# Patient Record
Sex: Male | Born: 1935 | Race: Black or African American | Hispanic: No | Marital: Married | Smoking: Former smoker
Health system: Southern US, Community
[De-identification: ages and names within clinical notes are randomized; demographics above are authoritative.]

## PROBLEM LIST (undated history)

## (undated) DIAGNOSIS — I10 Essential (primary) hypertension: Secondary | ICD-10-CM

## (undated) DIAGNOSIS — T8859XA Other complications of anesthesia, initial encounter: Secondary | ICD-10-CM

## (undated) DIAGNOSIS — H3530 Unspecified macular degeneration: Secondary | ICD-10-CM

## (undated) DIAGNOSIS — C259 Malignant neoplasm of pancreas, unspecified: Secondary | ICD-10-CM

## (undated) DIAGNOSIS — Z87442 Personal history of urinary calculi: Secondary | ICD-10-CM

## (undated) DIAGNOSIS — N400 Enlarged prostate without lower urinary tract symptoms: Secondary | ICD-10-CM

## (undated) DIAGNOSIS — M1990 Unspecified osteoarthritis, unspecified site: Secondary | ICD-10-CM

## (undated) DIAGNOSIS — D369 Benign neoplasm, unspecified site: Secondary | ICD-10-CM

## (undated) DIAGNOSIS — Z9889 Other specified postprocedural states: Secondary | ICD-10-CM

## (undated) DIAGNOSIS — R112 Nausea with vomiting, unspecified: Secondary | ICD-10-CM

## (undated) DIAGNOSIS — E785 Hyperlipidemia, unspecified: Secondary | ICD-10-CM

## (undated) DIAGNOSIS — R0989 Other specified symptoms and signs involving the circulatory and respiratory systems: Secondary | ICD-10-CM

## (undated) DIAGNOSIS — K222 Esophageal obstruction: Secondary | ICD-10-CM

## (undated) DIAGNOSIS — C61 Malignant neoplasm of prostate: Secondary | ICD-10-CM

## (undated) DIAGNOSIS — T4145XA Adverse effect of unspecified anesthetic, initial encounter: Secondary | ICD-10-CM

## (undated) DIAGNOSIS — K219 Gastro-esophageal reflux disease without esophagitis: Secondary | ICD-10-CM

## (undated) HISTORY — PX: PARTIAL COLECTOMY: SHX5273

## (undated) HISTORY — DX: Essential (primary) hypertension: I10

## (undated) HISTORY — DX: Malignant neoplasm of prostate: C61

## (undated) HISTORY — DX: Benign neoplasm, unspecified site: D36.9

## (undated) HISTORY — DX: Other specified symptoms and signs involving the circulatory and respiratory systems: R09.89

## (undated) HISTORY — DX: Esophageal obstruction: K22.2

## (undated) HISTORY — DX: Gastro-esophageal reflux disease without esophagitis: K21.9

## (undated) HISTORY — DX: Unspecified macular degeneration: H35.30

## (undated) HISTORY — PX: HERNIA REPAIR: SHX51

## (undated) HISTORY — DX: Hyperlipidemia, unspecified: E78.5

---

## 1999-06-07 ENCOUNTER — Encounter: Admission: RE | Admit: 1999-06-07 | Discharge: 1999-06-23 | Payer: Self-pay | Admitting: Orthopedic Surgery

## 2000-03-27 HISTORY — PX: OTHER SURGICAL HISTORY: SHX169

## 2000-04-05 ENCOUNTER — Ambulatory Visit (HOSPITAL_COMMUNITY): Admission: RE | Admit: 2000-04-05 | Discharge: 2000-04-05 | Payer: Self-pay | Admitting: Internal Medicine

## 2000-04-05 ENCOUNTER — Encounter: Payer: Self-pay | Admitting: Internal Medicine

## 2000-05-28 ENCOUNTER — Other Ambulatory Visit: Admission: RE | Admit: 2000-05-28 | Discharge: 2000-05-28 | Payer: Self-pay | Admitting: Gastroenterology

## 2000-05-28 ENCOUNTER — Encounter (INDEPENDENT_AMBULATORY_CARE_PROVIDER_SITE_OTHER): Payer: Self-pay

## 2001-07-04 ENCOUNTER — Encounter: Payer: Self-pay | Admitting: Internal Medicine

## 2001-07-04 ENCOUNTER — Encounter: Admission: RE | Admit: 2001-07-04 | Discharge: 2001-07-04 | Payer: Self-pay | Admitting: Internal Medicine

## 2002-03-27 HISTORY — PX: ANKLE FUSION: SHX881

## 2002-05-22 ENCOUNTER — Emergency Department (HOSPITAL_COMMUNITY): Admission: EM | Admit: 2002-05-22 | Discharge: 2002-05-22 | Payer: Self-pay | Admitting: Emergency Medicine

## 2002-05-22 ENCOUNTER — Encounter: Payer: Self-pay | Admitting: Orthopedic Surgery

## 2002-06-05 ENCOUNTER — Encounter: Payer: Self-pay | Admitting: Orthopedic Surgery

## 2002-06-11 ENCOUNTER — Inpatient Hospital Stay (HOSPITAL_COMMUNITY): Admission: RE | Admit: 2002-06-11 | Discharge: 2002-06-13 | Payer: Self-pay | Admitting: Orthopedic Surgery

## 2003-03-20 ENCOUNTER — Emergency Department (HOSPITAL_COMMUNITY): Admission: AD | Admit: 2003-03-20 | Discharge: 2003-03-20 | Payer: Self-pay | Admitting: Family Medicine

## 2004-03-27 HISTORY — PX: ROTATOR CUFF REPAIR: SHX139

## 2004-12-30 ENCOUNTER — Ambulatory Visit (HOSPITAL_COMMUNITY): Admission: RE | Admit: 2004-12-30 | Discharge: 2004-12-31 | Payer: Self-pay | Admitting: Orthopedic Surgery

## 2005-04-14 ENCOUNTER — Ambulatory Visit: Payer: Self-pay | Admitting: Internal Medicine

## 2005-05-10 ENCOUNTER — Ambulatory Visit: Payer: Self-pay | Admitting: Internal Medicine

## 2005-07-25 ENCOUNTER — Ambulatory Visit: Payer: Self-pay | Admitting: Internal Medicine

## 2005-08-09 ENCOUNTER — Ambulatory Visit: Payer: Self-pay | Admitting: Internal Medicine

## 2005-11-15 ENCOUNTER — Ambulatory Visit: Payer: Self-pay | Admitting: Internal Medicine

## 2006-05-31 ENCOUNTER — Ambulatory Visit: Payer: Self-pay | Admitting: Internal Medicine

## 2006-05-31 LAB — CONVERTED CEMR LAB
AST: 32 units/L (ref 0–37)
Alkaline Phosphatase: 80 units/L (ref 39–117)
Bilirubin, Direct: 0.1 mg/dL (ref 0.0–0.3)
Creatinine,U: 72.2 mg/dL
Potassium: 4.3 meq/L (ref 3.5–5.1)
Total Protein: 7 g/dL (ref 6.0–8.3)

## 2006-06-13 ENCOUNTER — Ambulatory Visit: Payer: Self-pay | Admitting: Internal Medicine

## 2006-07-13 ENCOUNTER — Emergency Department (HOSPITAL_COMMUNITY): Admission: EM | Admit: 2006-07-13 | Discharge: 2006-07-13 | Payer: Self-pay | Admitting: Emergency Medicine

## 2006-07-16 ENCOUNTER — Ambulatory Visit: Payer: Self-pay | Admitting: Internal Medicine

## 2006-07-16 LAB — CONVERTED CEMR LAB
Basophils Absolute: 0 10*3/uL (ref 0.0–0.1)
Eosinophils Relative: 3.9 % (ref 0.0–5.0)
HCT: 42.8 % (ref 39.0–52.0)
Hemoglobin: 14.3 g/dL (ref 13.0–17.0)
MCV: 84.2 fL (ref 78.0–100.0)
Neutro Abs: 3.2 10*3/uL (ref 1.4–7.7)
PSA: 2.4 ng/mL (ref 0.10–4.00)
RDW: 13.3 % (ref 11.5–14.6)
TSH: 0.96 microintl units/mL (ref 0.35–5.50)

## 2006-07-25 ENCOUNTER — Ambulatory Visit: Payer: Self-pay | Admitting: Internal Medicine

## 2006-09-04 ENCOUNTER — Telehealth: Payer: Self-pay | Admitting: Internal Medicine

## 2006-09-06 DIAGNOSIS — I10 Essential (primary) hypertension: Secondary | ICD-10-CM | POA: Insufficient documentation

## 2006-09-14 ENCOUNTER — Telehealth (INDEPENDENT_AMBULATORY_CARE_PROVIDER_SITE_OTHER): Payer: Self-pay | Admitting: *Deleted

## 2006-09-24 ENCOUNTER — Encounter (INDEPENDENT_AMBULATORY_CARE_PROVIDER_SITE_OTHER): Payer: Self-pay | Admitting: *Deleted

## 2006-09-24 ENCOUNTER — Ambulatory Visit: Payer: Self-pay | Admitting: Internal Medicine

## 2006-09-27 ENCOUNTER — Ambulatory Visit: Payer: Self-pay | Admitting: Internal Medicine

## 2006-09-27 LAB — CONVERTED CEMR LAB: Cholesterol, target level: 200 mg/dL

## 2006-09-28 LAB — CONVERTED CEMR LAB
HDL: 45.8 mg/dL (ref >39.0)
Potassium: 4.3 meq/L (ref 3.5–5.1)
Triglycerides: 38 mg/dL (ref 0–149)

## 2006-11-14 ENCOUNTER — Telehealth (INDEPENDENT_AMBULATORY_CARE_PROVIDER_SITE_OTHER): Payer: Self-pay | Admitting: *Deleted

## 2006-11-22 ENCOUNTER — Ambulatory Visit: Payer: Self-pay | Admitting: Internal Medicine

## 2006-11-22 DIAGNOSIS — E119 Type 2 diabetes mellitus without complications: Secondary | ICD-10-CM | POA: Insufficient documentation

## 2006-11-26 LAB — CONVERTED CEMR LAB
Creatinine,U: 193.4 mg/dL
Microalb Creat Ratio: 9.3 mg/g (ref 0.0–30.0)

## 2006-11-27 ENCOUNTER — Encounter (INDEPENDENT_AMBULATORY_CARE_PROVIDER_SITE_OTHER): Payer: Self-pay | Admitting: *Deleted

## 2007-02-26 ENCOUNTER — Inpatient Hospital Stay (HOSPITAL_COMMUNITY): Admission: EM | Admit: 2007-02-26 | Discharge: 2007-03-07 | Payer: Self-pay | Admitting: Emergency Medicine

## 2007-03-01 ENCOUNTER — Telehealth: Payer: Self-pay | Admitting: Internal Medicine

## 2007-03-02 ENCOUNTER — Encounter (INDEPENDENT_AMBULATORY_CARE_PROVIDER_SITE_OTHER): Payer: Self-pay | Admitting: Surgery

## 2007-04-18 ENCOUNTER — Ambulatory Visit: Payer: Self-pay | Admitting: Internal Medicine

## 2007-04-20 LAB — CONVERTED CEMR LAB: Hgb A1c MFr Bld: 6 % (ref 4.6–6.0)

## 2007-04-23 ENCOUNTER — Encounter (INDEPENDENT_AMBULATORY_CARE_PROVIDER_SITE_OTHER): Payer: Self-pay | Admitting: *Deleted

## 2007-05-03 ENCOUNTER — Encounter: Admission: RE | Admit: 2007-05-03 | Discharge: 2007-05-03 | Payer: Self-pay | Admitting: Surgery

## 2007-05-15 ENCOUNTER — Ambulatory Visit: Payer: Self-pay | Admitting: Gastroenterology

## 2007-05-17 ENCOUNTER — Ambulatory Visit: Payer: Self-pay | Admitting: Gastroenterology

## 2007-07-17 ENCOUNTER — Encounter (INDEPENDENT_AMBULATORY_CARE_PROVIDER_SITE_OTHER): Payer: Self-pay | Admitting: Surgery

## 2007-07-17 ENCOUNTER — Inpatient Hospital Stay (HOSPITAL_COMMUNITY): Admission: RE | Admit: 2007-07-17 | Discharge: 2007-07-21 | Payer: Self-pay | Admitting: Surgery

## 2007-08-04 ENCOUNTER — Inpatient Hospital Stay (HOSPITAL_COMMUNITY): Admission: EM | Admit: 2007-08-04 | Discharge: 2007-08-06 | Payer: Self-pay | Admitting: Emergency Medicine

## 2007-10-15 ENCOUNTER — Ambulatory Visit: Payer: Self-pay | Admitting: Internal Medicine

## 2007-10-21 ENCOUNTER — Encounter (INDEPENDENT_AMBULATORY_CARE_PROVIDER_SITE_OTHER): Payer: Self-pay | Admitting: *Deleted

## 2007-10-28 ENCOUNTER — Ambulatory Visit: Payer: Self-pay | Admitting: Internal Medicine

## 2007-10-28 DIAGNOSIS — Z860100 Personal history of colon polyps, unspecified: Secondary | ICD-10-CM | POA: Insufficient documentation

## 2007-10-28 DIAGNOSIS — K5730 Diverticulosis of large intestine without perforation or abscess without bleeding: Secondary | ICD-10-CM | POA: Insufficient documentation

## 2008-01-21 ENCOUNTER — Ambulatory Visit: Payer: Self-pay | Admitting: Internal Medicine

## 2008-01-21 LAB — CONVERTED CEMR LAB
AST: 29 units/L (ref 0–37)
Albumin: 3.6 g/dL (ref 3.5–5.2)
BUN: 17 mg/dL (ref 6–23)
Cholesterol: 170 mg/dL (ref 0–200)
HDL: 44.3 mg/dL (ref >39.0)
LDL Cholesterol: 112 mg/dL — ABNORMAL HIGH (ref 0–99)
Total Bilirubin: 0.7 mg/dL (ref 0.3–1.2)
Total Protein: 6.7 g/dL (ref 6.0–8.3)
VLDL: 13 mg/dL (ref 0–40)

## 2008-01-28 ENCOUNTER — Ambulatory Visit: Payer: Self-pay | Admitting: Internal Medicine

## 2008-01-29 ENCOUNTER — Telehealth (INDEPENDENT_AMBULATORY_CARE_PROVIDER_SITE_OTHER): Payer: Self-pay | Admitting: *Deleted

## 2008-03-11 ENCOUNTER — Telehealth (INDEPENDENT_AMBULATORY_CARE_PROVIDER_SITE_OTHER): Payer: Self-pay | Admitting: *Deleted

## 2008-03-16 ENCOUNTER — Encounter: Payer: Self-pay | Admitting: Internal Medicine

## 2008-04-27 ENCOUNTER — Telehealth (INDEPENDENT_AMBULATORY_CARE_PROVIDER_SITE_OTHER): Payer: Self-pay | Admitting: *Deleted

## 2008-04-29 ENCOUNTER — Ambulatory Visit: Payer: Self-pay | Admitting: Internal Medicine

## 2008-05-03 LAB — CONVERTED CEMR LAB
Albumin: 3.8 g/dL (ref 3.5–5.2)
Alkaline Phosphatase: 74 units/L (ref 39–117)
Bilirubin, Direct: 0.1 mg/dL (ref 0.0–0.3)
Creatinine,U: 101.8 mg/dL
Microalb, Ur: 4.8 mg/dL — ABNORMAL HIGH (ref 0.0–1.9)
Triglycerides: 59 mg/dL (ref 0–149)
VLDL: 12 mg/dL (ref 0–40)

## 2008-05-04 ENCOUNTER — Encounter (INDEPENDENT_AMBULATORY_CARE_PROVIDER_SITE_OTHER): Payer: Self-pay | Admitting: *Deleted

## 2008-05-06 ENCOUNTER — Ambulatory Visit: Payer: Self-pay | Admitting: Internal Medicine

## 2008-05-07 ENCOUNTER — Ambulatory Visit (HOSPITAL_COMMUNITY): Admission: RE | Admit: 2008-05-07 | Discharge: 2008-05-07 | Payer: Self-pay | Admitting: Surgery

## 2008-05-22 ENCOUNTER — Telehealth (INDEPENDENT_AMBULATORY_CARE_PROVIDER_SITE_OTHER): Payer: Self-pay | Admitting: *Deleted

## 2008-06-04 ENCOUNTER — Ambulatory Visit: Payer: Self-pay | Admitting: Internal Medicine

## 2008-07-01 ENCOUNTER — Telehealth (INDEPENDENT_AMBULATORY_CARE_PROVIDER_SITE_OTHER): Payer: Self-pay | Admitting: *Deleted

## 2008-10-28 ENCOUNTER — Ambulatory Visit: Payer: Self-pay | Admitting: Internal Medicine

## 2008-10-28 LAB — CONVERTED CEMR LAB
BUN: 22 mg/dL (ref 6–23)
Creatinine, Ser: 1.2 mg/dL (ref 0.4–1.5)
Creatinine,U: 114.1 mg/dL
Potassium: 4.1 meq/L (ref 3.5–5.1)

## 2008-10-29 ENCOUNTER — Encounter (INDEPENDENT_AMBULATORY_CARE_PROVIDER_SITE_OTHER): Payer: Self-pay | Admitting: *Deleted

## 2009-01-14 ENCOUNTER — Telehealth (INDEPENDENT_AMBULATORY_CARE_PROVIDER_SITE_OTHER): Payer: Self-pay | Admitting: *Deleted

## 2009-02-15 ENCOUNTER — Telehealth (INDEPENDENT_AMBULATORY_CARE_PROVIDER_SITE_OTHER): Payer: Self-pay | Admitting: *Deleted

## 2009-02-26 ENCOUNTER — Ambulatory Visit: Payer: Self-pay | Admitting: Internal Medicine

## 2009-04-13 ENCOUNTER — Ambulatory Visit: Payer: Self-pay | Admitting: Internal Medicine

## 2009-04-13 DIAGNOSIS — N400 Enlarged prostate without lower urinary tract symptoms: Secondary | ICD-10-CM

## 2009-04-13 DIAGNOSIS — E785 Hyperlipidemia, unspecified: Secondary | ICD-10-CM | POA: Insufficient documentation

## 2009-05-14 ENCOUNTER — Ambulatory Visit: Payer: Self-pay | Admitting: Internal Medicine

## 2009-05-17 LAB — CONVERTED CEMR LAB
ALT: 27 units/L (ref 0–53)
AST: 38 units/L — ABNORMAL HIGH (ref 0–37)
Alkaline Phosphatase: 70 units/L (ref 39–117)
Basophils Absolute: 0.1 10*3/uL (ref 0.0–0.1)
Basophils Relative: 1.7 % (ref 0.0–3.0)
Bilirubin, Direct: 0.1 mg/dL (ref 0.0–0.3)
Eosinophils Absolute: 0.2 10*3/uL (ref 0.0–0.7)
HCT: 41.9 % (ref 39.0–52.0)
HDL: 52.1 mg/dL (ref >39.00)
LDL Cholesterol: 84 mg/dL (ref 0–99)
Lymphocytes Relative: 25.7 % (ref 12.0–46.0)
Lymphs Abs: 1 10*3/uL (ref 0.7–4.0)
Monocytes Absolute: 0.4 10*3/uL (ref 0.1–1.0)
Monocytes Relative: 10.7 % (ref 3.0–12.0)
Neutrophils Relative %: 57 % (ref 43.0–77.0)
Total Bilirubin: 0.7 mg/dL (ref 0.3–1.2)
Total Protein: 7.3 g/dL (ref 6.0–8.3)
VLDL: 20.2 mg/dL (ref 0.0–40.0)
WBC: 3.8 10*3/uL — ABNORMAL LOW (ref 4.5–10.5)

## 2009-06-24 ENCOUNTER — Encounter: Payer: Self-pay | Admitting: Internal Medicine

## 2009-09-19 ENCOUNTER — Emergency Department (HOSPITAL_COMMUNITY): Admission: EM | Admit: 2009-09-19 | Discharge: 2009-09-19 | Payer: Self-pay | Admitting: Family Medicine

## 2009-09-29 ENCOUNTER — Ambulatory Visit: Payer: Self-pay | Admitting: Internal Medicine

## 2009-09-29 DIAGNOSIS — R972 Elevated prostate specific antigen [PSA]: Secondary | ICD-10-CM

## 2009-09-29 LAB — CONVERTED CEMR LAB: Hgb A1c MFr Bld: 6.1 % (ref 4.6–6.5)

## 2009-10-01 ENCOUNTER — Encounter: Payer: Self-pay | Admitting: Internal Medicine

## 2009-10-01 ENCOUNTER — Telehealth (INDEPENDENT_AMBULATORY_CARE_PROVIDER_SITE_OTHER): Payer: Self-pay | Admitting: *Deleted

## 2009-11-04 ENCOUNTER — Telehealth (INDEPENDENT_AMBULATORY_CARE_PROVIDER_SITE_OTHER): Payer: Self-pay | Admitting: *Deleted

## 2009-11-08 ENCOUNTER — Encounter: Payer: Self-pay | Admitting: Internal Medicine

## 2009-11-26 ENCOUNTER — Ambulatory Visit: Payer: Self-pay | Admitting: Internal Medicine

## 2009-11-30 ENCOUNTER — Encounter: Payer: Self-pay | Admitting: Internal Medicine

## 2009-11-30 ENCOUNTER — Emergency Department (HOSPITAL_COMMUNITY)
Admission: EM | Admit: 2009-11-30 | Discharge: 2009-11-30 | Payer: Self-pay | Source: Home / Self Care | Admitting: Emergency Medicine

## 2009-12-14 ENCOUNTER — Telehealth (INDEPENDENT_AMBULATORY_CARE_PROVIDER_SITE_OTHER): Payer: Self-pay | Admitting: *Deleted

## 2009-12-21 ENCOUNTER — Encounter: Payer: Self-pay | Admitting: Internal Medicine

## 2009-12-22 ENCOUNTER — Ambulatory Visit: Payer: Self-pay | Admitting: Internal Medicine

## 2009-12-22 DIAGNOSIS — T783XXA Angioneurotic edema, initial encounter: Secondary | ICD-10-CM | POA: Insufficient documentation

## 2009-12-22 DIAGNOSIS — Z8546 Personal history of malignant neoplasm of prostate: Secondary | ICD-10-CM | POA: Insufficient documentation

## 2009-12-23 ENCOUNTER — Encounter: Payer: Self-pay | Admitting: Internal Medicine

## 2009-12-24 ENCOUNTER — Encounter: Admission: RE | Admit: 2009-12-24 | Discharge: 2009-12-24 | Payer: Self-pay | Admitting: Internal Medicine

## 2010-03-23 ENCOUNTER — Encounter: Payer: Self-pay | Admitting: Internal Medicine

## 2010-03-27 HISTORY — PX: TOTAL HIP ARTHROPLASTY: SHX124

## 2010-04-26 NOTE — Letter (Signed)
Summary: [facility]   Imported By: Lanelle Bal 07/05/2009 13:57:47  _____________________________________________________________________  External Attachment:    Type:   Image     Comment:   External Document

## 2010-04-26 NOTE — Progress Notes (Signed)
Summary: elevated bp   Phone Note Call from Patient Call back at Home Phone [phone]   Caller: Patient Summary of Call: Patient called says the bp is still elevated current dose of meds not helping on two different occassions bp was 154/89 on patient cuff and [facility] 169[facility]84. He also stated that when he was in hospital for prostate biopsy his bp was 200/110 so he had to wait until it came down before he was let go wanted to know what he should do no he is currently taking 1 1/2 two times a day of carvedilol.  Hop pls advise Initial call taken by: Doristine Devoid CMA,  December 14, 2009 8:28 AM  Follow-up for Phone Call        Increase Carvedilol to 25 mg bid Follow-up by: Marga Melnick MD,  December 14, 2009 8:51 AM  Additional Follow-up for Phone Call Additional follow up Details #1::        spoke w/ patient aware of recommendations informed that he can finish what he already has at home and take 2 of the 12.5 two times a day until he runs out and then he will have new prescription at pharmacy...Marland KitchenMarland KitchenDoristine Devoid CMA  December 14, 2009 11:02 AM     New/Updated Medications: CARVEDILOL 25 MG TABS (CARVEDILOL) take one tablet two times a day Prescriptions: CARVEDILOL 25 MG TABS (CARVEDILOL) take one tablet two times a day  #60 x 2   Entered by:   Doristine Devoid CMA   Authorized by:   Marga Melnick MD   Signed by:   Doristine Devoid CMA on 12/14/2009   Method used:   [facility] to        [facility]  [address] [id number]       [address]       [place], [place]  [address]       Ph: [phone]       Fax: [phone]   RxID:   [id number]

## 2010-04-26 NOTE — Assessment & Plan Note (Signed)
Summary: STILL W/ ELEVATED BP/CDJ   Vital Signs:  Patient profile:   76 year old male Weight:      130.4 pounds Temp:     98.2 degrees F oral Pulse rate:   72 / minute BP sitting:   190 / 98  (left arm) Cuff size:   large  Vitals Entered By: Shonna Chock CMA (December 22, 2009 8:07 AM)  Serial Vital Signs/Assessments:  Time      Position  BP       Pulse  Resp  Temp     By 8:08 AM   R Arm     174/98                         Chrae Malloy CMA  CC: Elevated B/P since change in B/P med   CC:  Elevated B/P since change in B/P med.  History of Present Illness: Hypertension Follow-Up      This is a 74 year old man who presents for Hypertension follow-up.  The patient reports "gnagging" intermittent occipital  headaches, but denies lightheadedness, urinary frequency, and fatigue.  Associated symptoms include  intermittent L chest pressure. This was evaluated @ ER 11/30/2009  with EKG .He was seen there  for marked HTN following prostate biopsy. ("low grade cancer " as per Dr Vonita Moss 12/21/2009) .  BP was up to 203/99 in ER  but was 154/83 on repeat prior to discharge. Records were reviewed : creat 1.3; cardiac enzymes normal.The patient denies the following associated symptoms: chest pain, chest pressure, exercise intolerance, dyspnea, syncope, leg edema, and pedal edema.  Compliance with medications (by patient report) has been near 100%.  The patient reports exercising daily as walking 1 mpd.  Adjunctive measures currently used by the patient include salt restriction.  BP had been well controlled on Benazepril /Amlodipine but he developed lip angioedema.  Allergies (verified): 1)  ! Cipro (Ciprofloxacin Hcl) 2)  ! Benazepril Hcl (Ace-I) (Benazepril Hcl)  Past History:  Past Medical History: Hypertension R femoral  bruit BPH Colonic polyps, hx of Diverticulosis, colon Macular Degeneration, Glaucoma suspect Hyperlipidemia Prostate cancer, hx of , 11/2009, Dr Vonita Moss  Past  Surgical History: esophageal dilation-05/2000 squamous papilloma of esophagus ankle fusion-04/2002 colonoscopy-09/2003 ; partial colectomy 02/2007 , Dr Luisa Hart, perforated diverticulitis; reattachment 06/2007 rotator cuff sx-12/2004; Prostate biopsy 11/30/2009  Physical Exam  General:  Thin,in no acute distress; alert,appropriate and cooperative throughout examination Lungs:  Normal respiratory effort, chest expands symmetrically. Lungs are clear to auscultation, no crackles or wheezes. Heart:  normal rate, regular rhythm, no gallop, no rub, no JVD, no HJR, and grade 1 /6 systolic murmur @ apex.   Abdomen:  Bowel sounds positive,abdomen soft and non-tender without masses, organomegaly or hernias noted.No AAA;? faint  bruit L periumbilical area Pulses:  R and L carotid,radial,dorsalis pedis and posterior tibial pulses are full and equal bilaterally Extremities:  No clubbing, cyanosis, edema. Psych:  memory intact for recent and remote, normally interactive, good eye contact, and not anxious appearing.     Impression & Recommendations:  Problem # 1:  HYPERTENSION (ICD-401.9)  uncontrolled  His updated medication list for this problem includes:    Hydrochlorothiazide 12.5 Mg Caps (Hydrochlorothiazide) .Marland Kitchen... 1 qd    Carvedilol 25 Mg Tabs (Carvedilol) .Marland Kitchen... Take one tablet two times a day    Amlodipine Besylate 10 Mg Tabs (Amlodipine besylate) .Marland Kitchen... 1 once daily  Orders: Radiology Referral (Radiology)  Problem # 2:  ANGIOEDEMA (  ICD-995.1) due to ACE-I  Problem # 3:  PROSTATE CANCER, HX OF (ICD-V10.46)  Complete Medication List: 1)  Metformin Hcl 1000 Mg Tabs (Metformin hcl) .... Take 1/2 pill with largest meal 2)  Multivitamin Macular Protect Complete  3)  Pravastatin Sodium 20 Mg Tabs (Pravastatin sodium) .Marland Kitchen.. 1 qhs 4)  Hydrochlorothiazide 12.5 Mg Caps (Hydrochlorothiazide) .Marland Kitchen.. 1 qd 5)  Macular Protect  .... 2 by mouth two times a day 6)  Aleve 220 Mg Tabs (Naproxen sodium)  .... As needed 7)  Aspirin 81 Mg Tabs (Aspirin) .Marland Kitchen.. 1 by mouth once daily 8)  Carvedilol 25 Mg Tabs (Carvedilol) .... Take one tablet two times a day 9)  Amlodipine Besylate 10 Mg Tabs (Amlodipine besylate) .Marland Kitchen.. 1 once daily  Patient Instructions: 1)  You can NOT take ACE-I or ARB BP meds. Restart Amlodipine 10 mg once daily.  2)  Check your Blood Pressure regularly. If it is above: 140/90 ON AVERAGE after 7-10 days  you should make an appointment.

## 2010-04-26 NOTE — Assessment & Plan Note (Signed)
Summary: ELEVATED BP, HEADACHE//FD   Vital Signs:  Patient profile:   75 year old male Weight:      130.8 pounds Temp:     98.6 degrees F oral Pulse rate:   72 / minute Resp:     16 per minute BP sitting:   174 / 110  (left arm) Cuff size:   large  Vitals Entered By: Shonna Chock CMA (November 26, 2009 3:42 PM) CC: Elevated B/P and headaches, compared cuffs patient's cuff: 154/88 pulse 77   CC:  Elevated B/P and headaches and compared cuffs patient's cuff: 154/88 pulse 77.  History of Present Illness: Hypertension Follow-Up      This is a 75 year old man who presents for Hypertension follow-up.  The patient reports lightheadedness and headaches, but denies urinary frequency and fatigue.  The patient denies the following associated symptoms: chest pain, chest pressure, exercise intolerance, dyspnea, palpitations, syncope, leg edema, and pedal edema.  Compliance with medications (by patient report) has been near 100%.  Adjunctive measures currently used by the patient include salt restriction.   His BP @ home : 132/73- 149/102  after change from Labetalol & Amlodipine/ Lisinopril.  Current Medications (verified): 1)  Metformin Hcl 1000 Mg  Tabs (Metformin Hcl) .... Take 1/2 Pill With Largest Meal 2)  Multivitamin Macular Protect Complete 3)  Pravastatin Sodium 20 Mg Tabs (Pravastatin Sodium) .Marland Kitchen.. 1 Qhs 4)  Hydrochlorothiazide 12.5 Mg Caps (Hydrochlorothiazide) .Marland Kitchen.. 1 Qd 5)  Macular Protect .... 2 By Mouth Two Times A Day 6)  Aleve 220 Mg Tabs (Naproxen Sodium) .... As Needed 7)  Aspirin 81 Mg Tabs (Aspirin) .Marland Kitchen.. 1 By Mouth Once Daily 8)  Carvedilol 6.25 Mg Tabs (Carvedilol) .Marland Kitchen.. 1 Two Times A Day (In Place of Labetatlol & Lotrel)  Allergies (verified): No Known Drug Allergies  Physical Exam  General:  Thin,in no acute distress; alert,appropriate and cooperative throughout examination Lungs:  Normal respiratory effort, chest expands symmetrically. Lungs are clear to auscultation,  no crackles or wheezes. Heart:  Normal rate and regular rhythm. S1 and S2 normal without gallop, murmur, click, rub or other extra sounds. Abdomen:  Bowel sounds positive,abdomen soft and non-tender without masses, organomegaly or hernias noted.Mid line op scar. No AAA or bruits Pulses:  R and L carotid,radial,dorsalis pedis and posterior tibial pulses are full and equal bilaterally Extremities:  No clubbing, cyanosis, edema.   Impression & Recommendations:  Problem # 1:  HYPERTENSION (ICD-401.9)  His updated medication list for this problem includes:    Hydrochlorothiazide 12.5 Mg Caps (Hydrochlorothiazide) .Marland Kitchen... 1 qd  Orders: Prescription Created Electronically [phone])  Complete Medication List: 1)  Metformin Hcl 1000 Mg Tabs (Metformin hcl) .... Take 1/2 pill with largest meal 2)  Multivitamin Macular Protect Complete  3)  Pravastatin Sodium 20 Mg Tabs (Pravastatin sodium) .Marland Kitchen.. 1 qhs 4)  Hydrochlorothiazide 12.5 Mg Caps (Hydrochlorothiazide) .Marland Kitchen.. 1 qd 5)  Macular Protect  .... 2 by mouth two times a day 6)  Aleve 220 Mg Tabs (Naproxen sodium) .... As needed 7)  Aspirin 81 Mg Tabs (Aspirin) .Marland Kitchen.. 1 by mouth once daily 8)  Carvedilol 12.5 Mg Tabs (carvedilol)  .Marland Kitchen.. 1 two times a day  Patient Instructions: 1)  Finish Carvedilol 6.25  as 2 pills two times a day, then fill new Rx for 12.5. After 7-10 days if BP not @ goal of < 135/85; increase to 12.5 mg 1& 1/2 two times a day . Maximal dose 25 mg two times a day  Prescriptions: CARVEDILOL 12.5 MG TABS (CARVEDILOL) 1 two times a day  #60 x 5   Entered and Authorized by:   Marga Melnick MD   Signed by:   Marga Melnick MD on 11/26/2009   Method used:   [facility] ...       [facility]  [address] [id number]       [address]       [place], [place]  [address]       Ph: [phone]       Fax: [phone]   RxID:   [id number]

## 2010-04-26 NOTE — Letter (Signed)
[facility]: [facility]   Imported By: Lanelle Bal 12/29/2009 09:54:21  _____________________________________________________________________  External Attachment:    Type:   Image     Comment:   External Document

## 2010-04-26 NOTE — Consult Note (Signed)
Summary: [facility]  [facility]   Imported By: Lanelle Bal 11/23/2009 09:15:38  _____________________________________________________________________  External Attachment:    Type:   Image     Comment:   External Document

## 2010-04-26 NOTE — Progress Notes (Signed)
Summary: refill  Phone Note Refill Request Call back at Home Phone [phone] Message from:  Patient on November 04, 2009 9:23 AM  Refills Requested: Medication #1:  METFORMIN HCL 1000 MG  TABS TAKE 1/2 pill with largest meal  Medication #2:  HYDROCHLOROTHIAZIDE 12.5 MG CAPS 1 qd patient needs 90 day sypply [name]  fax [phone]  Initial call taken by: Okey Regal [place],  November 04, 2009 9:24 AM    Prescriptions: HYDROCHLOROTHIAZIDE 12.5 MG CAPS (HYDROCHLOROTHIAZIDE) 1 qd  #90 x 1   Entered by:   Shonna Chock CMA   Authorized by:   Marga Melnick MD   Signed by:   Shonna Chock CMA on 11/04/2009   Method used:   Faxed to ...       [facility] (mail-order)             , [place]         Ph: [phone]       Fax: [phone]   RxID:   [id number] METFORMIN HCL 1000 MG  TABS (METFORMIN HCL) TAKE 1/2 pill with largest meal  #90 x 1   Entered by:   Shonna Chock CMA   Authorized by:   Marga Melnick MD   Signed by:   Shonna Chock CMA on 11/04/2009   Method used:   Faxed to ...       [facility] (mail-order)             , [place]         Ph: [phone]       Fax: [phone]   RxID:   [id number]

## 2010-04-26 NOTE — Assessment & Plan Note (Signed)
Summary: prostate exam//PSA//lch   Vital Signs:  Patient profile:   75 year old male Weight:      133.8 pounds BMI:     19.00 Temp:     98.3 degrees F oral Pulse rate:   72 / minute Resp:     16 per minute BP sitting:   122 / 78  (left arm) Cuff size:   large  Vitals Entered By: Shonna Chock (September 29, 2009 3:26 PM) CC: 1.) Prostate Exam  2.) Discuss meds-? reaction, seen in Emergency Room on 6/26  3.)Refill meds Comments REVIEWED MED LIST, PATIENT AGREED DOSE AND INSTRUCTION CORRECT    CC:  1.) Prostate Exam  2.) Discuss meds-? reaction and seen in Emergency Room on 6/26  3.)Refill meds.  History of Present Illness: No significant GU symptoms .PSA was 2.40 in 06/2006 & 4.91 in 04/2009. BPH  1+ on DRE 02/11. No FH prostate disease.  Allergies (verified): No Known Drug Allergies  Review of Systems General:  Denies chills, fever, sweats, and weight loss. GU:  Denies discharge, dysuria, hematuria, incontinence, nocturia, urinary frequency, and urinary hesitancy; Some decrease in flow. Endo:  Denies excessive hunger, excessive thirst, and excessive urination; FBS average 104.  Physical Exam  General:  Thin,alert,appropriate and cooperative throughout examination Abdomen:  Bowel sounds positive,abdomen soft and non-tender without masses, organomegaly or hernias noted. Well healed op scar Rectal:  No external abnormalities noted. Normal sphincter tone. No rectal masses or tenderness. Genitalia:  Testes bilaterally descended without nodularity, tenderness or masses. No scrotal masses or lesions. No penis lesions or urethral discharge. Prostate:  no nodules, no induration, and R asymmetrical 1+ enlargement.     Impression & Recommendations:  Problem # 1:  HYPERPLASIA PROSTATE UNS W/O UR OBST & OTH LUTS (ICD-600.90)  Mild R enlargement  Orders: Venipuncture (15176) TLB-PSA (Prostate Specific Antigen) (84153-PSA) TLB-Udip w/ Micro (81001-URINE)  Problem # 2:  PROSTATE  SPECIFIC ANTIGEN, ELEVATED (ICD-790.93)  4.91 in 04/2009  Orders: Venipuncture (16073) TLB-PSA (Prostate Specific Antigen) (84153-PSA) TLB-Udip w/ Micro (81001-URINE)  Complete Medication List: 1)  Labetalol Hcl 300 Mg Tabs (Labetalol hcl) .Marland Kitchen.. 1&1/2 two times a day 2)  Metformin Hcl 1000 Mg Tabs (Metformin hcl) .... Take 1/2 pill with largest meal 3)  Lotrel 10-40 Mg Caps (Amlodipine besy-benazepril hcl) .... Take one tablet daily 4)  Multivitamin Macular Protect Complete  5)  Pravastatin Sodium 20 Mg Tabs (Pravastatin sodium) .Marland Kitchen.. 1 qhs 6)  Hydrochlorothiazide 12.5 Mg Caps (Hydrochlorothiazide) .Marland Kitchen.. 1 qd 7)  Macular Protect  .... 2 by mouth two times a day 8)  Aleve 220 Mg Tabs (Naproxen sodium) .... As needed 9)  Aspirin 81 Mg Tabs (Aspirin) .Marland Kitchen.. 1 by mouth once daily  Patient Instructions: 1)  Report any prostate symptoms  Appended Document: prostate exam//PSA//lch See Appendum to A1c . ACE-I angioedema suspect. Plan: D/C Lotrel . Start Carvedilol .  Appended Document: prostate exam//PSA//lch  Laboratory Results   Urine Tests   Date/Time Reported: September 29, 2009 4:14 PM   Routine Urinalysis   Color: yellow Appearance: Clear Glucose: negative   (Normal Range: Negative) Bilirubin: negative   (Normal Range: Negative) Ketone: negative   (Normal Range: Negative) Spec. Gravity: 1.025   (Normal Range: 1.003-1.035) Blood: negative   (Normal Range: Negative) pH: 5.0   (Normal Range: 5.0-8.0) Protein: negative   (Normal Range: Negative) Urobilinogen: negative   (Normal Range: 0-1) Nitrite: negative   (Normal Range: Negative) Leukocyte Esterace: negative   (Normal Range: Negative)  Comments: Floydene Flock  September 29, 2009 4:15 PM

## 2010-04-26 NOTE — Miscellaneous (Signed)
Summary: Orders Update  Clinical Lists Changes  Orders: Added new Referral order of Urology Referral (Urology) - Signed 

## 2010-04-26 NOTE — Assessment & Plan Note (Signed)
Summary: med refill//ph   Vital Signs:  Patient profile:   75 year old male Height:      70.5 inches Weight:      131 pounds Temp:     97.9 degrees F oral Pulse rate:   64 / minute Resp:     16 per minute BP sitting:   130 / 80  (left arm)  Vitals Entered By: Jeremy Johann CMA (April 13, 2009 1:00 PM) CC: med refill, Hypertension Management, Lipid Management Comments REVIEWED MED LIST, PATIENT AGREED DOSE AND INSTRUCTION CORRECT    CC:  med refill, Hypertension Management, and Lipid Management.  History of Present Illness: [name] is asymptomatic @ present; BP averages 123/63 @ home. No specific diet. Walking 3-5 mp week w/o symptoms except ankle pain occasionally.   Hypertension History:      He complains of neurologic problems, but denies headache, chest pain, palpitations, dyspnea with exertion, orthopnea, PND, peripheral edema, visual symptoms, syncope, and side effects from treatment.        Positive major cardiovascular risk factors include male age 74 years old or older, diabetes, hyperlipidemia, and hypertension.  Negative major cardiovascular risk factors include negative family history for ischemic heart disease and non-tobacco-user status.        Further assessment for target organ damage reveals no history of ASHD, stroke/TIA, or peripheral vascular disease.    Lipid Management History:      Positive NCEP/ATP III risk factors include male age 71 years old or older, diabetes, and hypertension.  Negative NCEP/ATP III risk factors include no family history for ischemic heart disease, non-tobacco-user status, no ASHD (atherosclerotic heart disease), no prior stroke/TIA, no peripheral vascular disease, and no history of aortic aneurysm.      Preventive Screening-Counseling & Management  Caffeine-Diet-Exercise     Does Patient Exercise: no  Allergies (verified): No Known Drug Allergies  Past History:  Past Medical History: Hypertension R femoral   bruit BPH Colonic polyps, hx of Diverticulosis, colon Macular Degeneration, Glaucoma suspect Hyperlipidemia  Past Surgical History: esophageal dilation-05/2000 squamous papilloma of esophagus ankle fusion-04/2002 colonoscopy-09/2003 ; partial colectomy 02/2007 , Dr Luisa Hart, perforated diverticulitis; reattachment 06/2007 rotator cuff sx-12/2004  Family History: Father : renal disease ? from rheumatic fever, HTN No Fam hx MI  Social History: Former Smoker: quit 1991 Alcohol use-no Retired Married Regular exercise-no Does Patient Exercise:  no  Review of Systems General:  Denies fatigue and weight loss. Eyes:  Denies blurring, double vision, and vision loss-both eyes; No retinopathy. ENT:  Denies difficulty swallowing and hoarseness. CV:  Denies leg cramps with exertion, lightheadness, and near fainting. Resp:  Denies cough, shortness of breath, and sputum productive. GI:  Denies abdominal pain, bloody stools, dark tarry stools, and indigestion. GU:  Denies discharge, dysuria, and hematuria. MS:  Complains of joint pain and joint swelling; denies joint redness. Derm:  Denies poor wound healing. Neuro:  Complains of numbness and tingling; N&T @ ankles esp LLE, ? from arthritis. Endo:  Denies cold intolerance, excessive hunger, excessive thirst, excessive urination, and heat intolerance; FBS average 103; no hypoglycemia.  Physical Exam  General:  Thin but  well-nourished,in no acute distress; alert,appropriate and cooperative throughout examination Neck:  No deformities, masses, or tenderness noted. Lungs:  Normal respiratory effort, chest expands symmetrically. Lungs are clear to auscultation, no crackles or wheezes. Heart:  Normal rate and regular rhythm. S1 and S2 normal without gallop, murmur, click, rub. S4 Abdomen:  Bowel sounds positive,abdomen scaphoid,soft and non-tender without masses,  organomegaly or hernias noted. Rectal:  No external abnormalities noted. Normal  sphincter tone. No rectal masses or tenderness. Genitalia:  Testes bilaterally descended without nodularity, tenderness or masses. No scrotal masses or lesions. No penis lesions or urethral discharge. Prostate:  no nodules, no asymmetry, no induration, and 1+ enlarged.   Pulses:  R and L carotid,radial,dorsalis pedis and posterior tibial pulses are full and equal bilaterally Extremities:  No clubbing, cyanosis.trace left pedal edema.  Fusiform L ankle Neurologic:  alert & oriented X3, sensation intact to light touch over feet, and DTRs symmetrical and normal.   Cervical Nodes:  No lymphadenopathy noted Axillary Nodes:  No palpable lymphadenopathy Psych:  memory intact for recent and remote, normally interactive, and good eye contact.     Impression & Recommendations:  Problem # 1:  DIABETES-TYPE 2 (ICD-250.00)  His updated medication list for this problem includes:    Metformin Hcl 1000 Mg Tabs (Metformin hcl) .Marland Kitchen... Take 1/2 pill with largest meal  Problem # 2:  HYPERTENSION (ICD-401.9)  His updated medication list for this problem includes:    Labetalol Hcl 300 Mg Tabs (Labetalol hcl) .Marland Kitchen... 1&1/2 two times a day    Hydrochlorothiazide 12.5 Mg Caps (Hydrochlorothiazide) .Marland Kitchen... 1 qd  Orders: EKG w/ Interpretation (93000)  Problem # 3:  HYPERLIPIDEMIA (ICD-272.4)  His updated medication list for this problem includes:    Pravastatin Sodium 20 Mg Tabs (Pravastatin sodium) .Marland Kitchen... 1 qhs  Problem # 4:  HYPERPLASIA PROSTATE UNS W/O UR OBST & OTH LUTS (ICD-600.90)  Complete Medication List: 1)  Labetalol Hcl 300 Mg Tabs (Labetalol hcl) .Marland Kitchen.. 1&1/2 two times a day 2)  Metformin Hcl 1000 Mg Tabs (Metformin hcl) .... Take 1/2 pill with largest meal 3)  Lotrel 10-40 Mg Caps (amlodipine Besy-benazepril Hcl)  .Marland Kitchen.. 1 by mouth qd 4)  Multivitamin Macular Protect Complete  5)  Pravastatin Sodium 20 Mg Tabs (Pravastatin sodium) .Marland Kitchen.. 1 qhs 6)  Hydrochlorothiazide 12.5 Mg Caps (Hydrochlorothiazide)  .Marland Kitchen.. 1 qd 7)  Macular Protect  .... 2 by mouth two times a day  Other Orders: TD Toxoids IM 7 YR + (65784) Admin 1st Vaccine (69629) Admin 1st Vaccine [facility]) [phone])  Hypertension Assessment/Plan:      The patient's hypertensive risk group is category C: Target organ damage and/or diabetes.  His calculated 10 year risk of coronary heart disease is 18 %.  Today's blood pressure is 130/80.    Lipid Assessment/Plan:      Based on NCEP/ATP III, the patient's risk factor category is "history of diabetes".  The patient's lipid goals are as follows: Total cholesterol goal is 200; LDL cholesterol goal is 100; HDL cholesterol goal is 40; Triglyceride goal is 150.  His LDL cholesterol goal has been met.  Secondary causes for hyperlipidemia have been ruled out.  He has been counseled on adjunctive measures for lowering his cholesterol and has been provided with dietary instructions.    Patient Instructions: 1)  Check your blood sugars regularly. If your readings are usually above : 150 or below 90 you should contact our office. 2)  See your eye doctor yearly to check for diabetic eye damage. 3)  Check your feet each night for sore areas, calluses or signs of infection. 4)  Check your Blood Pressure regularly. If it is above:135/85 ON AVERAGE  you should make an appointment. 5)  Please schedule a follow-up appointment in 1 month for fasting labs. 6)  BUN,creat,K+ prior to visit, ICD-9:401.9 7)  Hepatic Panel prior to  visit, ICD-9:995.20 8)  Lipid Panel prior to visit, ICD-9:272.4 9)  TSH prior to visit, ICD-9:272.4 10)  CBC w/ Diff prior to visit, ICD-9:211.3 11)  PSA prior to visit, ICD-9:600.9 Prescriptions: HYDROCHLOROTHIAZIDE 12.5 MG CAPS (HYDROCHLOROTHIAZIDE) 1 qd  #90 x 1   Entered and Authorized by:   Marga Melnick MD   Signed by:   Marga Melnick MD on 04/13/2009   Method used:   Print then Give to Patient   RxID:   [id number] PRAVASTATIN SODIUM 20 MG TABS (PRAVASTATIN SODIUM) 1  qhs  #90 Tablet x 3   Entered and Authorized by:   Marga Melnick MD   Signed by:   Marga Melnick MD on 04/13/2009   Method used:   Print then Give to Patient   RxID:   [id number] LOTREL 10-40 MG  CAPS (AMLODIPINE BESY-BENAZEPRIL HCL) 1 by mouth qd  #90 x 1   Entered and Authorized by:   Marga Melnick MD   Signed by:   Marga Melnick MD on 04/13/2009   Method used:   Print then Give to Patient   RxID:   [id number] METFORMIN HCL 1000 MG  TABS (METFORMIN HCL) TAKE 1/2 pill with largest meal  #90 x 0   Entered and Authorized by:   Marga Melnick MD   Signed by:   Marga Melnick MD on 04/13/2009   Method used:   Print then Give to Patient   RxID:   [id number] LABETALOL HCL 300 MG  TABS (LABETALOL HCL) 1&1/2 two times a day  #450 x 1   Entered and Authorized by:   Marga Melnick MD   Signed by:   Marga Melnick MD on 04/13/2009   Method used:   Print then Give to Patient   RxID:   [id number]    Tetanus/Td Vaccine    Vaccine Type: Td    Site: right deltoid    Mfr: Sanofi Pasteur    Dose: 0.5 ml    Route: IM    Given by: Jeremy Johann CMA    Exp. Date: 05/29/2010    Lot #: [id number]    VIS given: 02/12/07 version given April 13, 2009.

## 2010-04-26 NOTE — Letter (Signed)
Summary: [facility]   Imported By: Lanelle Bal 01/03/2010 13:43:44  _____________________________________________________________________  External Attachment:    Type:   Image     Comment:   External Document

## 2010-04-26 NOTE — Progress Notes (Signed)
Summary: Lab Results  Phone Note Outgoing Call Call back at [facility] Phone [phone]   Call placed by: Shonna Chock,  October 01, 2009 7:57 AM Call placed to: Patient Summary of Call: Spoke with patient re: lab results below, patient ok'd  [name], this is only a mild elevation for your age, but  it has increased since last testing. With this rise & the asymmetric changes in your prostate , I recommend a Urology consultation to be cautious. Hopp  Chrae Malloy  October 01, 2009 7:57 AM

## 2010-04-28 NOTE — Letter (Signed)
Summary: [facility]  [facility]   Imported By: Lanelle Bal 04/04/2010 09:49:39  _____________________________________________________________________  External Attachment:    Type:   Image     Comment:   External Document

## 2010-06-09 ENCOUNTER — Encounter: Payer: Self-pay | Admitting: Internal Medicine

## 2010-06-09 ENCOUNTER — Ambulatory Visit (INDEPENDENT_AMBULATORY_CARE_PROVIDER_SITE_OTHER): Payer: Medicare Other | Admitting: Internal Medicine

## 2010-06-09 DIAGNOSIS — I10 Essential (primary) hypertension: Secondary | ICD-10-CM

## 2010-06-09 DIAGNOSIS — E785 Hyperlipidemia, unspecified: Secondary | ICD-10-CM

## 2010-06-09 DIAGNOSIS — T783XXA Angioneurotic edema, initial encounter: Secondary | ICD-10-CM

## 2010-06-09 LAB — POCT CARDIAC MARKERS
CKMB, poc: 2.9 ng/mL (ref 1.0–8.0)
Myoglobin, poc: 256 ng/mL (ref 12–200)
Troponin i, poc: <0.05 ng/mL (ref 0.00–0.09)
Troponin i, poc: <0.05 ng/mL (ref 0.00–0.09)
Troponin i, poc: <0.05 ng/mL (ref 0.00–0.09)

## 2010-06-09 LAB — DIFFERENTIAL
Eosinophils Relative: 2 % (ref 0–5)
Lymphocytes Relative: 16 % (ref 12–46)
Lymphs Abs: 1 10*3/uL (ref 0.7–4.0)
Monocytes Absolute: 0.5 10*3/uL (ref 0.1–1.0)
Monocytes Relative: 7 % (ref 3–12)
Neutro Abs: 4.6 10*3/uL (ref 1.7–7.7)

## 2010-06-09 LAB — CBC
HCT: 44.3 % (ref 39.0–52.0)
MCV: 85 fL (ref 78.0–100.0)
RBC: 5.21 MIL/uL (ref 4.22–5.81)
RDW: 13.3 % (ref 11.5–15.5)

## 2010-06-09 LAB — URINALYSIS, ROUTINE W REFLEX MICROSCOPIC
Glucose, UA: NEGATIVE mg/dL
Ketones, ur: NEGATIVE mg/dL
Leukocytes, UA: NEGATIVE
Nitrite: NEGATIVE
Protein, ur: 100 mg/dL — AB
Specific Gravity, Urine: 1.017 (ref 1.005–1.030)
pH: 6.5 (ref 5.0–8.0)

## 2010-06-09 LAB — POCT I-STAT, CHEM 8
BUN: 14 mg/dL (ref 6–23)
Calcium, Ion: 1.09 mmol/L — ABNORMAL LOW (ref 1.12–1.32)
Chloride: 102 mEq/L (ref 96–112)
Creatinine, Ser: 1.3 mg/dL (ref 0.4–1.5)
Glucose, Bld: 118 mg/dL — ABNORMAL HIGH (ref 70–99)

## 2010-06-14 NOTE — Assessment & Plan Note (Signed)
Summary: Discuss bp meds/cdj   Vital Signs:  Patient profile:   75 year old male Weight:      131.2 pounds Pulse rate:   64 / minute Resp:     14 per minute BP sitting:   132 / 80  (left arm) Cuff size:   large  Vitals Entered By: Shonna Chock CMA (June 09, 2010 3:44 PM) CC: Discuss B/P meds, patient taking old script for amlodipine/benazepril 10/40   CC:  Discuss B/P meds and patient taking old script for amlodipine/benazepril 10/40.  History of Present Illness:   He has been on the mail order  CCB/ ACE-I ( Amlodipine / Benazepril ) 10/40 & HCTZ 12.5 mg  once daily and Carvedilol 25 mg two times a day  with BP  averaging  132/70. The ACE-I( Benazepril ) was  thought to have caused angioedema.He restarted Amlodipine / Benazepril not appreciating that an ACE-I was part of the combination.He has not had angiedema recurrence with restart in 02/2010. He  denies lightheadedness, urinary frequency, headaches, edema, and fatigue.  The patient denies the following associated symptoms: chest pain, chest pressure, exercise intolerance, dyspnea, palpitations, and syncope.  The patient reports that dietary compliance has been good.  The patient reports exercising 5 X per week as walking 1 mpd.  Adjunctive measures currently used by the patient include salt restriction.    Current Medications (verified): 1)  Metformin Hcl 1000 Mg  Tabs (Metformin Hcl) .... Take 1/2 Pill With Largest Meal 2)  Multivitamin Macular Protect Complete 3)  Pravastatin Sodium 20 Mg Tabs (Pravastatin Sodium) .Marland Kitchen.. 1 By Mouth At Bedtime, **appointment Due** 4)  Hydrochlorothiazide 12.5 Mg Caps (Hydrochlorothiazide) .Marland Kitchen.. 1 Qd 5)  Macular Protect .... 2 By Mouth Two Times A Day 6)  Aleve 220 Mg Tabs (Naproxen Sodium) .... As Needed 7)  Aspirin 81 Mg Tabs (Aspirin) .Marland Kitchen.. 1 By Mouth Once Daily 8)  Amlodipine Besy-Benazepril Hcl 10-40 Mg Caps (Amlodipine Besy-Benazepril Hcl) .Marland Kitchen.. 1 By Mouth Once Daily  Allergies: 1)  ! Cipro  (Ciprofloxacin Hcl) 2)  ! Benazepril Hcl (Ace-I) (Benazepril Hcl)  Physical Exam  General:  Thin ; alert,appropriate and cooperative throughout examination Mouth:  Oral mucosa and oropharynx without lesions or exudates.  Teeth in good repair. Lungs:  Normal respiratory effort, chest expands symmetrically. Lungs are clear to auscultation, no crackles or wheezes. Heart:  normal rate, regular rhythm, no gallop, no rub, no JVD, and grade 1 /6 systolic murmur.   Abdomen:  Bowel sounds positive,abdomen soft and non-tender without masses, organomegaly or hernias noted. no AAA or bruits  Pulses:  R and L carotid,radial,dorsalis pedis and posterior tibial pulses are full and equal bilaterally Extremities:  trace left pedal edema.   No edema RLE Skin:  Intact without suspicious lesions or rashes Cervical Nodes:  No lymphadenopathy noted Axillary Nodes:  No palpable lymphadenopathy Psych:  memory intact for recent and remote, normally interactive, and good eye contact.     Impression & Recommendations:  Problem # 1:  HYPERTENSION (ICD-401.9)  controlled but back on CCB/ ACE-I The following medications were removed from the medication list:    Carvedilol 25 Mg Tabs (Carvedilol) .Marland Kitchen... Take one tablet two times a day    Amlodipine Besy-benazepril Hcl 10-40 Mg Caps (Amlodipine besy-benazepril hcl) .Marland Kitchen... 1 by mouth once daily His updated medication list for this problem includes:    Hydrochlorothiazide 12.5 Mg Caps (Hydrochlorothiazide) .Marland Kitchen... 1 qd    Amlodipine Besylate 10 Mg Tabs (Amlodipine besylate) .Marland KitchenMarland KitchenMarland KitchenMarland Kitchen 1  once daily  Orders: Prescription Created Electronically [phone])  Problem # 2:  ANGIOEDEMA (ICD-995.1) PMH of presumed due to ACE-I  Problem # 3:  HYPERLIPIDEMIA (ICD-272.4)  His updated medication list for this problem includes:    Pravastatin Sodium 20 Mg Tabs (Pravastatin sodium) .Marland Kitchen... 1 by mouth at bedtime,  Orders: Prescription Created Electronically [phone])  Complete Medication  List: 1)  Metformin Hcl 1000 Mg Tabs (Metformin hcl) .... Take 1/2 pill with largest meal 2)  Multivitamin Macular Protect Complete  3)  Pravastatin Sodium 20 Mg Tabs (Pravastatin sodium) .Marland Kitchen.. 1 by mouth at bedtime, 4)  Hydrochlorothiazide 12.5 Mg Caps (Hydrochlorothiazide) .Marland Kitchen.. 1 qd 5)  Macular Protect  .... 2 by mouth two times a day 6)  Aleve 220 Mg Tabs (Naproxen sodium) .... As needed 7)  Aspirin 81 Mg Tabs (Aspirin) .Marland Kitchen.. 1 by mouth once daily 8)  Amlodipine Besylate 10 Mg Tabs (Amlodipine besylate) .Marland Kitchen.. 1 once daily  Patient Instructions: 1)  Check your Blood Pressure regularly OFF Benazepril ; your goal = average   < 140/90. Get Medi Alert about ACE-I AND ARBs as discussed Prescriptions: PRAVASTATIN SODIUM 20 MG TABS (PRAVASTATIN SODIUM) 1 by mouth at bedtime,  #90 x 0   Entered and Authorized by:   Marga Melnick MD   Signed by:   Marga Melnick MD on 06/09/2010   Method used:   Faxed to ...       [facility] (mail-order)             , [place]         Ph: [phone]       Fax: [phone]   RxID:   [id number] AMLODIPINE BESYLATE 10 MG TABS (AMLODIPINE BESYLATE) 1 once daily  #90 x 1   Entered and Authorized by:   Marga Melnick MD   Signed by:   Marga Melnick MD on 06/09/2010   Method used:   Faxed to ...       [facility] (mail-order)             , [place]         Ph: [phone]       Fax: [phone]   RxID:   [id number]    Orders Added: 1)  Est. Patient Level IV [88416] 2)  Prescription Created Electronically [phone]  Appended Document: Discuss bp meds/cdj Prescriptions: AMLODIPINE BESYLATE 10 MG TABS (AMLODIPINE BESYLATE) 1 once daily  #30 x 0   Entered by:   Shonna Chock CMA   Authorized by:   Marga Melnick MD   Signed by:   Shonna Chock CMA on 06/09/2010   Method used:   Electronically to        [id number] (retail)       [address]       [place], [place]  [address]       Ph: [phone]       Fax: [phone]    RxID:   [id number]

## 2010-07-12 LAB — DIFFERENTIAL
Basophils Absolute: 0 10*3/uL (ref 0.0–0.1)
Basophils Relative: 0 % (ref 0–1)
Monocytes Relative: 14 % — ABNORMAL HIGH (ref 3–12)
Neutro Abs: 3.2 10*3/uL (ref 1.7–7.7)
Neutrophils Relative %: 56 % (ref 43–77)

## 2010-07-12 LAB — CBC
HCT: 38.4 % — ABNORMAL LOW (ref 39.0–52.0)
Hemoglobin: 13 g/dL (ref 13.0–17.0)
MCV: 84.9 fL (ref 78.0–100.0)
RDW: 13.9 % (ref 11.5–15.5)

## 2010-07-12 LAB — COMPREHENSIVE METABOLIC PANEL
Alkaline Phosphatase: 72 U/L (ref 39–117)
BUN: 16 mg/dL (ref 6–23)
Glucose, Bld: 108 mg/dL — ABNORMAL HIGH (ref 70–99)
Potassium: 3.5 mEq/L (ref 3.5–5.1)
Total Bilirubin: 0.6 mg/dL (ref 0.3–1.2)
Total Protein: 6.2 g/dL (ref 6.0–8.3)

## 2010-07-12 LAB — GLUCOSE, CAPILLARY: Glucose-Capillary: 101 mg/dL — ABNORMAL HIGH (ref 70–99)

## 2010-07-21 ENCOUNTER — Encounter: Payer: Self-pay | Admitting: Internal Medicine

## 2010-07-30 ENCOUNTER — Encounter: Payer: Self-pay | Admitting: Internal Medicine

## 2010-08-02 ENCOUNTER — Encounter: Payer: Self-pay | Admitting: Internal Medicine

## 2010-08-02 ENCOUNTER — Ambulatory Visit (INDEPENDENT_AMBULATORY_CARE_PROVIDER_SITE_OTHER): Payer: Medicare Other | Admitting: Internal Medicine

## 2010-08-02 DIAGNOSIS — M169 Osteoarthritis of hip, unspecified: Secondary | ICD-10-CM

## 2010-08-02 DIAGNOSIS — I10 Essential (primary) hypertension: Secondary | ICD-10-CM

## 2010-08-02 DIAGNOSIS — Z01818 Encounter for other preprocedural examination: Secondary | ICD-10-CM

## 2010-08-02 DIAGNOSIS — Z860100 Personal history of colon polyps, unspecified: Secondary | ICD-10-CM

## 2010-08-02 DIAGNOSIS — E119 Type 2 diabetes mellitus without complications: Secondary | ICD-10-CM

## 2010-08-02 DIAGNOSIS — E785 Hyperlipidemia, unspecified: Secondary | ICD-10-CM

## 2010-08-02 DIAGNOSIS — Z0000 Encounter for general adult medical examination without abnormal findings: Secondary | ICD-10-CM

## 2010-08-02 LAB — BASIC METABOLIC PANEL
BUN: 16 mg/dL (ref 6–23)
Calcium: 9.9 mg/dL (ref 8.4–10.5)
GFR: 105.18 mL/min (ref >60.00)
Glucose, Bld: 100 mg/dL — ABNORMAL HIGH (ref 70–99)

## 2010-08-02 LAB — HEPATIC FUNCTION PANEL
ALT: 24 U/L (ref 0–53)
AST: 32 U/L (ref 0–37)
Alkaline Phosphatase: 76 U/L (ref 39–117)
Total Bilirubin: 0.7 mg/dL (ref 0.3–1.2)

## 2010-08-02 LAB — HEMOGLOBIN A1C: Hgb A1c MFr Bld: 6.4 % (ref 4.6–6.5)

## 2010-08-02 LAB — LIPID PANEL
Cholesterol: 171 mg/dL (ref 0–200)
HDL: 55.3 mg/dL (ref >39.00)
VLDL: 14.8 mg/dL (ref 0.0–40.0)

## 2010-08-02 LAB — CBC WITH DIFFERENTIAL/PLATELET
Basophils Absolute: 0 10*3/uL (ref 0.0–0.1)
Lymphocytes Relative: 22.2 % (ref 12.0–46.0)
Lymphs Abs: 1.2 10*3/uL (ref 0.7–4.0)
Monocytes Relative: 7.5 % (ref 3.0–12.0)
Platelets: 227 10*3/uL (ref 150.0–400.0)
RDW: 13.7 % (ref 11.5–14.6)

## 2010-08-02 NOTE — Progress Notes (Signed)
Subjective:    Patient ID: [name], male    DOB: 07/19/1935, 75 y.o.   MRN: [id number]  HPI Medicare Wellness Visit:  The following psychosocial & medical history were reviewed as required by Medicare.   Social history: caffeine: 2 cups coffee daily , alcohol:  no ,  tobacco use : quit 1991  & exercise :previously  walking & yardwork 4-5 days / week. Exercise curtailed due to DJD  L hip .  THR scheduled 09/12/2010  by Dr Darrelyn Hillock.  Home & personal  safety / fall risk: hip affects gait, activities of daily living: no limitations , seatbelt use : yes , and smoke alarm employment : yes .  Power of AttorneyLliving Will status : not notarized  Vision ( as recorded per Nurse) & Hearing  evaluation :  Whisper heard @6  ft. Orientation :oriented x 3  , memory & recall : good,  Spelling  testing:  good,and mood & affect :  normal . Depression / anxiety:  No issues Travel history : 2002 [place] , immunization status :Shingles needed , transfusion history:  no, and preventive health surveillance ( colonoscopies, BMD , etc as per protocol/ SOC): up to date, Dental care:   yes . Chart reviewed &  Updated. Active issues reviewed & addressed.       Review of Systems  Patient reports no  vision/ hearing changes,anorexia, weight change, fever ,adenopathy, persistant / recurrent hoarseness, swallowing issues, chest pain,palpitations, edema,persistant / recurrent cough, hemoptysis, dyspnea(rest, exertional, paroxysmal nocturnal), gastrointestinal  bleeding (melena, rectal bleeding), abdominal pain, excessive heart burn, GU symptoms( dysuria, hematuria, pyuria, voiding/incontinence  issues) syncope, focal weakness, memory loss,numbness & tingling, skin/hair/nail changes, or  abnormal bruising/bleeding. Glaucoma "suspect"; seen every 6 months.  He did have a hypertensive response at the time of his prostate biopsy.       Objective:   Physical Exam Gen.: Thin but healthy and well-nourished in appearance.  Alert, appropriate and cooperative throughout exam. Head: Normocephalic without obvious abnormalities;  pattern alopecia . Goatee present Eyes: No corneal or conjunctival inflammation noted. Pupils equal round reactive to light and accommodation. Fundal exam is benign without hemorrhages, exudate, papilledema. Extraocular motion intact.  Ears: External  ear exam reveals no significant lesions or deformities. Canals clear .TMs normal. . Nose: External nasal exam reveals no deformity or inflammation. Nasal mucosa are pink and moist. No lesions or exudates noted. Septum  Dislocated & deviated  Mouth: Oral mucosa and oropharynx reveal no lesions or exudates. Teeth in good repair. Neck: No deformities, masses, or tenderness noted. Range of motion  normal. Thyroid  normal. Lungs: Normal respiratory effort; chest expands symmetrically. Lungs are clear to auscultation without rales, wheezes, or increased work of breathing. BS slightly decreased. Heart: Normal rate and rhythm. Normal S1 and S2. No gallop, click, or rub. S4; no murmur. Abdomen: Bowel sounds normal; abdomen soft and nontender. No masses, organomegaly or hernias noted. Genitalia: Dr Vonita Moss, Urologist, seen 6 months ago.                                                                                   Musculoskeletal/extremities: No deformity or scoliosis noted of  the  thoracic or lumbar spine. No clubbing, cyanosis, edema, or deformity noted. Range of motion  Decreased @ L hip due to pain .Tone & strength  normal.Joints normal. Nail health  good. Vascular: Carotid, radial artery, dorsalis pedis and dorsalis posterior tibial pulses are full and equal. No bruits present. Neurologic: Alert and oriented x3. Deep tendon reflexes symmetrical and normal.         Skin: Intact without suspicious lesions or rashes. Lymph: No cervical, axillary, or inguinal lymphadenopathy present. Psych: Mood and affect are normal. Normally interactive                                                                                          Assessment & Plan:  #1 Medicare Wellness Exam;criteria addressed & data entered.   #2 DJD L hip; tentatively cleared for surgery pending lab review of labs.   # 3 Diabetes.   # 4 HTN.   #5 dyslipidemia  Plan: See orders and recommendations

## 2010-08-02 NOTE — Assessment & Plan Note (Signed)
Tubular Adenoma 2005

## 2010-08-02 NOTE — Assessment & Plan Note (Signed)
NMR Lipoprofile 2007: LDL 172( 2950/2241), HDL 46, TG 76. LDL goal = < 100, ideally < 70 due to DM

## 2010-08-02 NOTE — Patient Instructions (Signed)
Preventive Health Care: Consume less than 40 grams of sugar per day from foods & drinks with High Fructose Corn Sugar as #2,3 or # 4 on label. Health Care Power of Attorney & Living Will. Complete if not in place ; these place you in charge of your health care decisions.

## 2010-08-09 NOTE — Op Note (Signed)
[name], [name]                 ACCOUNT NO.:  [id number]   MEDICAL RECORD NO.:  [id number]          PATIENT TYPE:  AMB   LOCATION:  DAY                          FACILITY:  [facility]   PHYSICIAN:  Thomas A. Cornett, M.D.DATE OF BIRTH:  [birth year]   DATE OF PROCEDURE:  05/07/2008  DATE OF DISCHARGE:                               OPERATIVE REPORT   PREOPERATIVE DIAGNOSIS:  Bilateral inguinal hernias.   POSTOPERATIVE DIAGNOSIS:  Bilateral inguinal hernias.   PROCEDURE:  Repair of bilateral inguinal hernias with mesh.   SURGEON:  Maisie Fus A. Cornett, M.D.   ANESTHESIA:  General endotracheal anesthesia with 0.25% Sensorcaine  locally.   EBL:  Less than 10 mL.   DRAINS:  None.   SPECIMENS:  None.   INDICATIONS FOR PROCEDURE:  The patient is a 75 year old male with  bilateral inguinal hernias.  These are asymptomatic and he presents for  repair of these.  Informed consent was obtained in the office as well as  risks and potential outcomes.   DESCRIPTION OF PROCEDURE:  The patient was brought to the operating room  and placed supine.  After induction of general anesthesia, the inguinal  region bilaterally was prepped and draped in a sterile fashion.  Right  side was done first.  Sensorcaine 0.25% with epinephrine was injected in  the right inguinal crease.  An oblique incision was made.  Dissection  was carried down through Scarpa fascia.  The superficial epigastric  vessels were ligated with 3-0 Vicryl ties.  The aponeurosis of the  external oblique was identified and injected with 0.25% Sensorcaine.  We  opened in the direction of its fibers.  Cord structures were identified  and encircled with a 1/2 inch Penrose drain.  He had a very large direct  sac.  I dissected this off the cord, reduced it back away from the cord.  The ilioinguinal nerve was tethered over this large inguinal hernia.  Therefore, I decided to ligate it at the level where it exited the  muscle.  The  iliohypogastric of the right was well away from all this.  After getting the hernia back reduced, I used a piece of ULTRAPRO mesh  cut in a keyhole configuration and then secured it to the floor of the  inguinal canal using #1 Novafil.  It was secured down to [employer] ligament  with #1 Novofil.  It was then secured along the shelving edge of the  inguinal ligament and along the conjoined tendon and internal oblique  medially with #1 Novofil.  A slit was cut for the cord structures and  then the tails were closed around the cord structures with #1 Novafil.  I could get the tip of my 5th digit into the new internal ring.  The  repair seemed quite good with no signs of bleeding at this point in  time.  We irrigated and closed the fascia with 2-0 Vicryl, 3-0 Vicryl  was used to approximate Scarpa fascia and 4-0 Monocryl was used to close  the skin.   The left side was addressed in a similar fashion.  Sensorcaine  0.25% was  injected into the left inguinal crease.  An oblique incision was made.  Dissection was carried down to Scarpa fascia and ligation of the  superficial epigastric vessels was done.  The aponeurosis of the  external oblique was identified and injected with 0.25% Sensorcaine.  The fibers were opened in their direction.  Cord structures were again  identified and encircled with 1/2 inch Penrose drain.  Again, he had a  large direct hernia on this side as well, which I dissected away from  the cord and reduced it.  In a similar fashion, we repaired the defect  with keyhole ULTRAPRO mesh sewn in a similar fashion as the right.  The  ilioinguinal nerve was also quite stretched on this side.  This was  divided as it exited the muscle.  The iliohypogastric nerve was not well  seen on this side after looking for it.  There was adequate space for  the cord structures to exit the mesh with the tip of my fifth digit  being inserted in the new internal ring.  Irrigation was used and   suctioned out.  Hemostasis was excellent.  We closed Scarpa fascia with  interrupted 3-0 Vicryl, and 4-0 Monocryl was used to close the skin in a  subcuticular fashion.  Dermabond was applied to both incisions.  All  final counts of sponge, needle and instruments were found to be correct  at this portion of the case.  The patient was then awakened, taken to  recovery in satisfactory condition.      Thomas A. Cornett, M.D.  Electronically Signed     TAC/MEDQ  D:  05/07/2008  T:  05/07/2008  Job:  [id number]   cc:   Titus Dubin. Alwyn Ren, MD,FACP,FCCP  [phone] [place]  [place] [address]

## 2010-08-09 NOTE — H&P (Signed)
NAMEZACHAREY, JENSEN NO.:  [id number]   MEDICAL RECORD NO.:  [id number]          PATIENT TYPE:  EMS   LOCATION:  ED                           FACILITY:  [facility]   PHYSICIAN:  Clovis Pu. Cornett, M.D.DATE OF BIRTH:  1935/05/01   DATE OF ADMISSION:  08/04/2007  DATE OF DISCHARGE:                              HISTORY & PHYSICAL   CHIEF COMPLAINT:  Weakness, rectal bleeding status post colostomy  closure 2-1/2 weeks ago.   HISTORY OF PRESENT ILLNESS:  The patient is a pleasant 75 year old male  2-1/2 weeks status post colostomy closure.  He started with some rectal  bleeding today.  He has had three bloody bowel movements but no bleeding  in between.  There was a fair amount of blood in the commode, he says.  He has had no nausea.  He has had a poor appetite since surgery and some  crampy abdominal pain managed by antispasmodics but has had problems  with side effects from it.  He called me today and I had him come to the  emergency room so I could evaluate him.  Currently, he is in no  distress.  His crampy abdominal pain actually is improving.  He is  having 2-3 bowel movements a day.  He is tired and worn down.   PAST MEDICAL HISTORY:  1. Type 2 diabetes mellitus.  2. Hypertension.  3. Perforated diverticulitis status post Hartmann's procedure and      subsequent closure of his colostomy.  4. Hypercholesterolemia.   PAST SURGICAL HISTORY:  1. Type 2 diabetes mellitus.  2. Hypertension.  3. Perforated diverticulitis status post Hartmann's procedure and      subsequent closure of his colostomy.  4. Hypercholesterolemia.  5. Sigmoid colectomy secondary to perforated diverticulitis back in      November of 2008.   FAMILY HISTORY:  Noncontributory.   SOCIAL HISTORY:  He denies tobacco or alcohol use.  He is married, lives  at home.  His son is with him as well as wife.   ALLERGIES TO MEDICINES:  None.   MEDICATIONS:  1. Amlodipine/benazepril 10/20 one  tablet daily.  2. Labetalol 300 mg daily.  3. Metformin 500 mg p.o. b.i.d.  4. Vytorin 10/20 daily.  5. Dicyclomine 100 mg p.o. b.i.d. for cramps.   REVIEW OF SYSTEMS:  Review of systems as above, otherwise negative x15.   PHYSICAL EXAM:  VITAL SIGNS:  Temperature 98, pulse 90, blood pressure  106/53, respiratory rate 16.  GENERAL APPEARANCE:  A pleasant male in no apparent stress.  HEENT: Extraocular movements are intact.  Oropharynx dry.  NECK:  Supple, nontender.  CHEST:  Clear to auscultation.  CARDIOVASCULAR:  Regular rate and rhythm without rub, murmur, or gallop.  ABDOMEN:  Soft.  Post-surgical scars are well-healed.  No rebound or  guarding.  Slight distention.  RECTAL:  Rectal was done.  He has grade 3 prolapsed inflamed swollen  hemorrhoids.  There is some blood when these are manipulate and he has  quite a bit of discomfort when I reduce these for him.  EXTREMITIES:  No clubbing, cyanosis, or edema.   DIAGNOSTIC STUDIES:  A CBC shows a hemoglobin of 10.1; back on July 18, 2007, postoperative day #1, it was 12.7.  He has a white count 12,800  with slight left shift.  Sodium 136, potassium 4, chloride 104, CO2 is  18, BUN 22, creatinine 1.3, albumin is 2.6.   IMPRESSION:  1. Rectal bleeding with grade 3 prolapse emergency room bleeding.  2. Questionable gastrointestinal bleeding.  3. Protein calorie malnutrition, albumin of 2.6.  4. Probable dehydration.   PLAN:  Will admit for IV fluids.  I will check a CBC in the morning.  Hydrate him overnight and go ahead and let him eat at this point in  time.  We will place him on ProctoFoam HC and Sitz baths to see if we  can help reduce of his perianal discomfort.  His abdominal cramping  actually has improved since I saw him in the office last week.  He had  been passing intermittent blood off and on but today had three bloody  bowel movements and that brought him in.  This certainly could be from  his hemorrhoids or could  be from a lower GI source, i.e. his  anastomosis.  His hemoglobin is only 10 at this point in time, and I  will follow him at this point to see what he does over the next day or  two.  We will go ahead and put him on a sliding scale insulin.      Thomas A. Cornett, M.D.  Electronically Signed     TAC/MEDQ  D:  08/04/2007  T:  08/04/2007  Job:  [id number]

## 2010-08-09 NOTE — Discharge Summary (Signed)
NAMEABDIFATAH, Pugh NO.:  [id number]   MEDICAL RECORD NO.:  [id number]          PATIENT TYPE:  INP   LOCATION:  5738                         FACILITY:  [facility]   PHYSICIAN:  Currie Paris, M.D.DATE OF BIRTH:  [birth year]   DATE OF ADMISSION:  02/26/2007  DATE OF DISCHARGE:  03/07/2007                               DISCHARGE SUMMARY   ADMITTING PHYSICIAN:  [name], M.D.   DISCHARGING PHYSICIAN:  Currie Paris, M.D.   OPERATIVE PHYSICIAN:  [name], M.D.   PRIMARY CARE PHYSICIAN:  Dr. Alwyn Pugh.   CHIEF COMPLAINT AND REASON FOR ADMISSION:  [name] is a 71-year male  patient history, diabetes and hypertension, who is retired, been  experiencing abdominal pain for 3 days, worse in the past 24 hours with  above vomiting, no bowel movement in 48 hours and no flatus.  The  patient has known bilateral inguinal hernias, which were soft and  nontender to the touch.  The patient presented to the ER, where his  vital signs were stable.  On abdominal exam his abdomen was nontender  with mild distention, no bowel sounds and, again, the bilateral hernia  of the inguinal region were soft.  The patient did undergo a CT scan in  the ER which showed a bilateral inguinal hernias not incarcerated.  He  had retained stool and marked amount of the dilated small bowel and gas  in the colon and a distended stomach.  Findings were consistent with an  ileus versus constipation and there was some concern that despite the  negative CT findings, the patient may have had or be experiencing a  subtle incarceration of an inguinal hernia.  The patient was admitted  for observation, IV fluid hydration and use of laxatives to achieve  resolution of constipation state.   ADMITTING DIAGNOSES:  1. Nausea, vomiting and ileus.  2. Obstipation.  3. Bilateral inguinal hernias, not apparently incarcerated on initial      exam.  4. Diabetes.  5. Hypertension.   HOSPITAL  COURSE:  The patient was admitted, placed on IV fluids.  Within  the first 24 hours he had not had a BM but he was passing flatus, stated  he felt better.  His abdomen was softer.  He was changed from n.p.o.  status to increase fluid hydration by mouth and MiraLax was started.  In  the next 24 hours the patient's plain films showed that he still had a  partial small bowel obstruction involving his hernia but his pain was  less, he was having active bowel sounds, so his diet was advanced and he  was continued on MiraLax.   During this same time period the patient began having intermittent  fevers up to 101.8.  Chest x-ray, urinalysis, blood cultures all  subsequently were negative.  The patient otherwise was having bowel  movements with flatus but seemed to be stable.  By hospital day #3 the  patient once again was spiking fevers up to 103 but otherwise vital  signs were stable.  His white count was 8900.  At  admission his white  count had been 14,500.  Hemoglobin was stable at 12.1.  Neutrophils were  85%, though.  On exam his abdomen at this time had become markedly  distended, almost firm, but was nontender.  He had a few isolated bowel  sounds but had stopped passing flatus and was becoming bloated and  having increased early satiety with attempting to eat breakfast during  the morning.  Plain abdominal films were checked and they did show an  increase in small bowel obstruction pattern.  The patient was made  n.p.o. and a CT of the abdomen and pelvis was ordered.  This was  completed late on the evening of March 01, 2007.  This did demonstrate  worsening small-bowel obstruction but no free air.  There was a  transition point in the right lower pelvis but no obvious inguinal  hernia involvement.   The following day the patient was rounded on in the morning and had low-  grade fevers.  No real change in abdominal exam.  He had passed a small  amount of liquid stool with a small  amount of flatus.  His white count  was normal at 8000.  Repeat KUB was performed.  This was followed up  later in the day by Dr. Luisa Pugh, who was on call.  The KUB showed a  worsening small-bowel obstruction.  The results were discussed with the  patient and his family and an exploratory laparotomy was recommended to  determine the etiology of the obstruction.   On March 02, 2007, the patient was taken to the OR by Dr. Luisa Pugh,  where he underwent a sigmoid colectomy with [facility] procedure and  colostomy secondary to a perforated sigmoid diverticulitis with  peridiverticular abscess causing a small bowel obstruction.  The patient  tolerated the procedure well and was sent back to the general floor to  recover.   Over the next several days the patient did very well.  He had an NG tube  in the immediate postop period.  He had no further episodes of fever or  leukocytosis.  His stoma remained pink and by postoperative day #2, we  were able to clamp his NG tube and discontinue his Foley and increase  his mobilization.  He was on Lovenox for DVT prophylaxis postoperatively  as well.  By postoperative day #3 the patient's NG tube was  discontinued.  He was started on a clear liquid diet and over the next  several days he was advanced to a solid diet and by postoperative day  #5, he was otherwise deemed appropriate for discharge home.  He was  afebrile.  His vital signs were stable.  His abdomen was soft and  nontender except over the immediate incision area, which was left open  with normal saline packing because of the contaminated intra-abdominal  process.  The wound itself was pink and granular.  His stoma has  remained pink, somewhat edematous, and with the advancement and regular  diet he has had a large volume of liquid brown stool but he has  tolerated this well and is tolerating increase p.o. fluids to replace  the ostomy losses.   During this hospitalization the patient is a  new ostomy patient, so  wound/ostomy nurse has been working with the patient and his wife  regarding ostomy care and they have demonstrated competence with the  initial teaching, and we will have home health nurses come to the house  to reinforce teaching and to assist  with wound management after  discharge.   The patient does have diabetes, and this has remained stable this  hospitalization.  His creatinine has been stable.  The last reading on  postop day #2 was 1.01.  Once the patient was tolerating a solid diet,  he was resumed back on his normal metformin dosage.   FINAL DISCHARGE DIAGNOSES:  1. Small bowel obstruction secondary to perforated sigmoid      diverticulitis with a peridiverticular abscess.  2. Status post exploratory laparotomy with sigmoid colectomy, Hartmann      procedure and colostomy.  3. Diabetes, controlled.  4. Hypertension, controlled.  5. Preoperative obstipation, resolved.  6. Stable bilateral inguinal hernias.   DISCHARGE MEDICATIONS:  The patient will resume the following home  medications:   1. Amlodipine/benazepril.  2. Labetalol.  3. Metformin.  4. Vytorin.   New medications:  1. Cipro 500 mg b.i.d. for 7 days secondary to recent perforated      diverticulum with abscess.  2. Flagyl 500 mg t.i.d. for 7 days for perforated diverticulum with      abscess.  3. Vicodin 5/325 mg 1-2 tablets every 4 hours as needed for pain.   Return to work/volunteering in 6 weeks.   ACTIVITY:  Increase activity slowly.  May walk up steps.  May shower.  No lifting for 6 weeks greater than 15 pounds,  no driving while  utilizing Percocet.   DIET:  Low-sodium, heart-healthy and diabetic modifications.   WOUND CARE:  Normal saline packing to the belly wound b.i.d.  Home  health RN to assist.  Routine ostomy care.  Home health RN to assist.   ADDITIONAL INSTRUCTIONS:  Please drink plenty of fluids while you are  having increased liquid output from the  colostomy.   FOLLOW-UP APPOINTMENTS:  He needs to call Dr. Luisa Pugh to be seen in 2  weeks, telephone number [phone].      Allison L. Rennis Harding, N.P.      Currie Paris, M.D.  Electronically Signed    ALE/MEDQ  D:  03/07/2007  T:  03/07/2007  Job:  [id number]   cc:   Thomas A. Pugh, M.D.

## 2010-08-09 NOTE — Op Note (Signed)
[name], [name]                 ACCOUNT NO.:  [id number]   MEDICAL RECORD NO.:  [id number]          PATIENT TYPE:  INP   LOCATION:  5738                         FACILITY:  [facility]   PHYSICIAN:  Thomas A. Cornett, M.D.DATE OF BIRTH:  [birth year]   DATE OF PROCEDURE:  03/02/2007  DATE OF DISCHARGE:                               OPERATIVE REPORT   PREOPERATIVE DIAGNOSIS:  Small-bowel obstruction and abdominal pain.   POSTOPERATIVE DIAGNOSIS:  Perforated sigmoid diverticulitis with peri-  diverticular abscess causing small bowel obstruction.   PROCEDURE:  Sigmoid colectomy with Hartmann's procedure.   SURGEON:  Harriette Bouillon, MD   ASSISTANT:  Glenna Fellows, MD   ANESTHESIA:  General endotracheal anesthesia.   ESTIMATED BLOOD LOSS:  150 mL.   SPECIMEN:  Sigmoid colon to Pathology.   DRAINS:  None.   INDICATIONS FOR PROCEDURE:  The patient is a 75 year old male admitted  to the General Surgery Service 2 days ago with abdominal pain.  He had  progressive dilatation of his small bowel, which is not getting better.  This was not associated with nausea or vomiting, but he was having  problems with abdominal pain.  Since his films were worsening, concern  was for a small bowel obstruction, etiology unclear.  Preop CT scan just  showed a small bowel obstruction.  Given the fact he was not improving,  I recommended exploratory laparotomy to him and he agreed to proceed.  Risks of the procedure were discussed preoperatively and he voices  understanding and agreed to proceed.   DESCRIPTION OF PROCEDURE:  The patient was brought to the operating room  and placed supine.  After induction of general anesthesia, a Foley  catheter was placed.  A midline incision was used after sterile prep and  drape.  We entered the abdominal cavity and there was significant serous  fluid, but no obvious stool.  The small bowel was massively dilated.  Once we were able to get in, we placed a retractor  and then ran the  small bowel from the ligament of Treitz.  The proximal to mid ileum was  stuck down in the left lower quadrant and there was a small abscess  cavity from a perforated sigmoid diverticulum.  The sigmoid colon was  inflamed and had an area perforation it looked like.  There was not a  lot of contamination, but there was a left lower quadrant abscess of  note.  Once we freed small bowel up from this, we ran the remainder of  the small bowel to the ligament Treitz and it was normal.  There was  some edema associated with it.  Ascending, transverse and the remainder  of the descending colon appeared normal without signs of ischemia.  Pulses were present in the mesentery.  Stomach and liver were grossly  normal.  After examining this segment of sigmoid colon, I felt that  resection with ostomy would be the best case for him, since he had a  small bowel obstruction, even though there was not a lot of  contamination and was a peridiverticular abscess.  Using the  cautery, I  mobilized the sigmoid colon along the white line of Toldt.  We were able  to pull the colon up quite easily.  There were numerous diverticula of  note with inspissated stool noted.  We found a healthy area proximal and  I fired a GIA 75 stapling device across the distal descending colon.  We  then took the mesentery of the colon up on the body of the colon.  There  was significant inflammation, but we were well away from the ureter on  the left.  We took the mesentery down until the distal sigmoid passed  where the perforation and abscess were.  Once I took this down, we fired  and I reloaded the GIA 75 stapling device and sent this segment of  sigmoid colon to Pathology.  Irrigation was used and suctioned out.  Hemostasis was excellent.  I placed a single 2-0 Prolene suture at the  stump of the distal sigmoid.  There were some distal sigmoid diverticula  of note.  We then mobilized the descending colon a  little bit more and  had more than an adequate length for ostomy.  We then milked back the  small bowel contents from the ileocecal valve and placed a 16-French  nasogastric tube under direct vision into the stomach.  We had to  exchange this for a 14-French and this actually sucked better and we  were able to get over a liter of GI contents out and decompressed the  small bowel significantly.  We ran the small bowel; it was viable with  some inflammatory changes, but no obvious injury to the small bowel that  I could see.  The area was stuck to the sigmoid colon was examined very  carefully and again, there was no evidence of perforation.  At this  point in time, irrigation was used and suctioned out.  I did not feel he  needed a drain, since we removed the abscess cavity with the sigmoid  colon.  At this point in time we removed the retractor.  We then created  a left lower quadrant ostomy.  A Kocher was used to grab the fascia and  the skin in the left lower quadrant.  An ellipse of skin was taken out  and dissection was carried down to the fat to his rectus abdominis  muscle.  A cruciate incision was made in the anterior sheath and I split  the fibers with my finger bluntly.  We then used a Babcock and pulled  the distal descending colon out through this and we had more than  adequate length and it was quite viable and pink.  We then closed the  fascia of the abdominal wall was double-stranded #1 PDS.  I placed a few  intermittent staples in his skin and packed the remainder of his wound  open.  Colostomy was matured with 4-0 Monocryl.  Appliance was applied.  All final counts of sponge, needle and instruments were found to be  correct at this portion of the case.  The patient was then extubated and  taken to Recovery in satisfactory condition.  All final counts of  sponge, needle and instruments were found to be correct x2.      Thomas A. Cornett, M.D.  Electronically  Signed     TAC/MEDQ  D:  03/02/2007  T:  03/03/2007  Job:  [id number]   cc:   Dr. Alwyn Ren

## 2010-08-09 NOTE — Assessment & Plan Note (Signed)
[facility]                         GASTROENTEROLOGY OFFICE NOTE   NAME:[name], [name]                        MRN:          [id number]  DATE:05/15/2007                            DOB:          [birth year]    REFERRING PHYSICIAN:  Maisie Fus A. Cornett, M.D.   REASON FOR CONSULTATION:  Personal history of adenomatous colon polyps  and recent colostomy for perforated diverticulitis.   HISTORY OF PRESENT ILLNESS:  Mr. Auxier is a 75 year old African-American  male that I have previously evaluated.  On colonoscopy in July of 2005  he had adenomatous colon polyps, diverticulosis involving the sigmoid  colon and descending colon, as well as internal hemorrhoids.  He was  hospitalized for acute diverticulitis in early December 2008, and  subsequently underwent a sigmoid colectomy, colostomy and Hartmann's  pouch surgery for perforated sigmoid colon, diverticulitis with a  peridiverticular abscess and a small bowel obstruction.  He has had an  excellent recovery to date.  Surgical reanastomosis is planned in the  near future by Dr. Luisa Hart.  He underwent a barium enema via rectum and  via colostomy on May 03, 2007, which showed diffuse colonic  diverticulosis and no other abnormalities.  He has no ongoing  gastrointestinal complaints and specifically denies any ostomy or rectal  bleeding, abdominal pain or weight-loss.  There is no family history of  colon cancer, colon polyps or inflammatory bowel disease.   PAST MEDICAL HISTORY:  Hypertension  diabetes mellitus  hyperlipidemia  arthritis  adenomatous colon polyps  diverticulosis  internal hemorrhoids   PAST SURGICAL HISTORY:  As listed in the History of Present Illness.   CURRENT MEDICATIONS:  Listed on the chart updated and reviewed.   MEDICATION ALLERGIES:  None known.   SOCIAL HISTORY:  Per the handwritten form.   REVIEW OF SYSTEMS:  Per the handwritten form.   PHYSICAL EXAMINATION:   Well-developed, well-nourished African-American  male, in no acute distress.  Blood pressure is 128/78, pulse 84 and regular.  HEENT EXAM:  Anicteric sclerae.  Oropharynx is clear.  CHEST:  Clear to auscultation bilaterally.  CARDIAC:  Regular rate and rhythm without murmurs appreciated.  ABDOMEN:  Soft with a pink and viable left lower quadrant colostomy, no  tenderness appreciated, normoactive bowel sounds, no palpable  organomegaly, masses or hernias.  RECTAL EXAMINATION:  Deferred to colonoscopy.  NEUROLOGIC:  Alert and oriented times three, grossly nonfocal.   ASSESSMENT AND PLAN:  Recent complicated diverticulitis, status post  sigmoid colectomy, colostomy, and Hartmann's pouch.  He has a personal  history of adenomatous colon polyps.  Recent barium study via colostomy  and rectum showed diffuse colonic diverticulosis and no other  abnormalities.  Risks, benefits, and alternatives to colonoscopy with  possible biopsy and possible polypectomy discussed with the patient and  he consents to proceed.  This will be scheduled electively.     Venita Lick. Russella Dar, MD, [facility]  Electronically Signed    MTS/MedQ  DD: 05/17/2007  DT: 05/17/2007  Job #: [id number]

## 2010-08-09 NOTE — Discharge Summary (Signed)
NAMEJOSHUS, ROGAN                 ACCOUNT NO.:  [id number]   MEDICAL RECORD NO.:  [id number]          PATIENT TYPE:  INP   LOCATION:  1523                         FACILITY:  [facility]   PHYSICIAN:  Maisie Fus A. Cornett, M.D.DATE OF BIRTH:  18-Oct-1935   DATE OF ADMISSION:  07/17/2007  DATE OF DISCHARGE:  07/21/2007                               DISCHARGE SUMMARY   ADMISSION DIAGNOSES:  1. History of perforated diverticulitis requiring colostomy.  2. Diabetes mellitus type 2.   DISCHARGE DIAGNOSES:  1. History of perforated diverticulitis requiring colostomy.  2. Diabetes mellitus type 2.   PROCEDURE PERFORMED:  Laparoscopic assisted closure of colostomy with  incisional appendectomy.   BRIEF HISTORY:  The patient is a pleasant 75 year old male who was seen  last November at [facility] while a patient ondoc-of-the-week  service.  He had a perforated sigmoid diverticulum with abscess and  obstruction.  He underwent emergency surgery and had a sigmoid colectomy  with diverting colostomy.  He presented for admission for closure of  this.   HOSPITAL COURSE:  The patient was admitted on 07/17/2007 after  laparoscopic-assisted colostomy closure.  Postoperative course was  uncomplicated.  His bowel function returned by postoperative day #2.  His diet was advanced on postoperative day 3 and he was discharged on  postoperative day 4.  His wounds were clean, dry and intact.  He was  passing urine without difficulty, ambulating without difficulty,  tolerating his medicine by mouth.  He was discharged home on  postoperative day #4 in satisfactory condition.   DISCHARGE INSTRUCTIONS:  He will follow up in one week with me to have  staples removed.  He will refrain from heavy lifting above 10 pounds for  three weeks and will take Vicodin one to two tablets q.4 p.r.n. pain.  He will resume his preoperative medications as indicated on his MAR  which include metformin 500 mg b.i.d., labetalol 300  mg b.i.d., Vytorin  10/20 daily, amlodipine 8 mg daily.   DISCHARGE CONDITION:  Improved.      Thomas A. Cornett, M.D.  Electronically Signed     TAC/MEDQ  D:  08/01/2007  T:  08/01/2007  Job:  [id number]

## 2010-08-09 NOTE — Op Note (Signed)
NAMEAIRAM, HEIDECKER                 ACCOUNT NO.:  [id number]   MEDICAL RECORD NO.:  [id number]          PATIENT TYPE:  INP   LOCATION:  0004                         FACILITY:  [facility]   PHYSICIAN:  Thomas A. Cornett, M.D.DATE OF BIRTH:  [birth year]   DATE OF PROCEDURE:  07/17/2007  DATE OF DISCHARGE:                               OPERATIVE REPORT   PREOPERATIVE DIAGNOSIS:  History of sigmoid diverticula his perforation  status post sigmoid colectomy and Hartmann's procedure.   POSTOPERATIVE DIAGNOSIS:  History of sigmoid diverticula his perforation  status post sigmoid colectomy and Hartmann's procedure.   PROCEDURE:  1. Laparoscopic assisted takedown of sigmoid colostomy.  2. Incidental appendectomy.  3. Rigid sigmoidoscopy.   SURGEON:  Harriette Bouillon, M.D.   ASSISTANT:  Leonie Man, M.D.   ANESTHESIA:  General endotracheal anesthesia, 0.25% Sensorcaine local.   ESTIMATED BLOOD LOSS:  100 mL.   SPECIMEN:  Residual distal sigmoid colon and proximal descending colon  to pathology.   INDICATIONS FOR PROCEDURE:  The patient is a pleasant 75 year old male  who was seen back in November due to perforated sigmoid diverticulitis  with abscess and small-bowel obstruction.  He underwent sigmoid  colectomy with colostomy.  He returns today to have his colostomy taken  down.  Preop workup showed no evidence of colon cancer and he had  evidence of other diverticula in the distal segment which included the  rectum and distal sigmoid colon.  The remainder of his diverticula are  scattered throughout the remainder of the colon.  Informed consent was  obtained.  He agreed to proceed.   DESCRIPTION OF PROCEDURE:  The patient was brought to the operating  room, placed supine.  After induction of general anesthesia the patient  was placed in stirrups.  His colostomy closed with a pursestring suture  of 2-0 silk.  The abdomen is prepped and draped in sterile fashion.  He  was appropriately  padded.  After sterile prepping and draping, I placed  a 5-mm OptiVu in the right midabdomen and got into the abdominal cavity  without evidence of bowel injury.  Pneumoperitoneum was created to 15  mmHg CO2.  We placed a laparoscope.  I placed a second 5 mm port in the  right upper quadrant.  A third 10/11 port was placed the right lower  quadrant.  Through these ports we took down some anterior abdominal wall  adhesions.  A loop of small bowel was also stuck to the anterior  abdominal wall.  We took this down very carefully with no evidence of  bowel injury.  The appendix was stuck to the stump as well as the cecum.  I was able to tease the appendix away but there appeared to be a very  large adhesive band from the cecum to the stump of what appeared to the  distal sigmoid colon and proximal rectum.  I felt this was too thick to  try to dissect through and I decided to staple through this.  I placed a  12 mm port in the right lower quadrant, stapled this common tissue  bridge  between the cecum and distal colon.  This may could have possibly  been a small fistula but at this point in time I did not want to risk  cutting through this and rather divide it sterilely.  We did this quite  easily with a GIA 35 stapling device with blue cartridge.  We then found  the appendix and felt it was prudent to remove this to avoid any issues  with acute appendicitis in the future.  We then took the mesoappendix  down with harmonic scalpel.  I reloaded the GIA 35 stapling device  placed it across the cecum and fired it.  Hemostasis was excellent in  the staple line and in the mesentery.  The abdominal cavity was free of  adhesions and I mobilized the stump was much as I could and I felt this  point we could convert to a minilaparotomy.  We allowed the CO2 to  escape at this point in time.  I used the previous midline scar just  below the umbilicus and made about a 10 cm incision.  He is very thin  and we  were able to get through the scar into the midline fascia and  into the abdominal cavity without difficulty.  Once I did this, I was  able to identify the stump.  He had some pelvic adhesions to the small  bowel and I took these down with Metzenbaum scissors and then retracted  the small bowel up into the upper abdominal cavity.  This fully exposed  the pelvis.  He had significant distal sigmoid colon left.  I decided to  remove the sigmoid down to the proximal rectum at the sacral promontory.  We took the LigaSure and took down the distal sigmoid colon as well as  the vessels to the distal sigmoid colon with a LigaSure.  I then used a  TA-60 to divide the distal sigmoid and passed this off the field as  specimen.  This left [place] with about a 15 cm rectal stump or less than it  looked like.  Once I had done this, I placed moist towels over the  abdomen.  I then took the colostomy down to the level of the skin using  cautery.  This was taken down and put back the abdominal cavity.  I then  went back about 5-6 cm in the colostomy to divide the bowel there for  the proximal segment to put the anvil for a EEA anastomosis.  Pursestring suture was then placed through this part of the colon using  a 2-0 Prolene.  I amputated the excess proximal colon and sent it off  the field.  Blood supply was excellent for this.  A 29 EEA stapler  Ethicon was used and the sizer fit appropriately.  We then put the 29  anvil into the proximal colon up through and then tied the pursestring  suture.  Next I went below and Dr. Lurene Shadow stayed for the upper portion  of this part of the procedure.  I then coming from below was able to  pass the dilator without difficulty transanal.  We then placed the 29  EEA stapling device from Ethicon up through and this was guided.  We  decided to bring our staple line on the anterior wall of the rectum.  We  deployed the spike and then clicked the anvil down on it, closed it  down,  made sure no other viscus was in the staple line after careful  inspection and then we fired.  Two complete doughnuts were recovered  after removal of the stapling device.  I then placed a rigid  sigmoidoscope up through this to easily visualize the anastomosis it  looked like between 10 and 12 cm on the anterior wall.  I was able to  pass through this.  There was no signs of any significant bleeding in  the staple line and it appeared to be intact with good vascular supply.  It was widely patent and I was able to easily pass this through the  proximal colon.  Insufflation revealed no evidence of leak with water  instilled into the pelvis.  I then removed the scope, changed gloves and  returned the upper part of the wound.  Upon inspection it appeared that  we were on the anterior wall of the rectum about 3-4 cm down from the  staple lines.  There was a little bit of redundant rectum.  This was a  side to end anastomosis it looked like.  There is some question since we  were so far anterior and more distal that we initially thought.  I went  ahead and replaced the rigid scope for one second looked just to make  sure we had not injured the bladder since we were somewhat more anterior  than I had realized.  I replaced rigid sigmoidoscope again, saw a widely  patent anastomosis, reinsufflated, saw no leakage of air.  There is no  air palpated within the bladder either to indicate that we had trapped  part of the bladder wall within this.  I did not see any urine within  the rectum and there was no blood in the urine and the urine was crystal  clear with no signs of problem there.  At this point we withdrew the  scope again satisfied that we had an adequate anastomosis and it  appeared that we were on the anterior wall of the rectum well away from  the bladder.  We then reexamined the pelvis.  I found some oozing from  the right distal rectal stump and I used cautery and then oversewed a   small bleeder in the mesentery with a tie of 3-0 silk.  At this point in  time irrigation was suctioned out, all sponges were removed.  The  colostomy site was closed with interrupted 2-0 PDS.  Midline was closed with #1 PDS.  Skin staples used to close skin on both  incisions.  Irrigation was used prior to doing this.  All final counts  of sponge, needle and instruments were found to be correct at this  portion of the case.  The patient was then awoke, taken to recovery in  satisfactory condition.      Thomas A. Cornett, M.D.  Electronically Signed     TAC/MEDQ  D:  07/17/2007  T:  07/17/2007  Job:  [id number]

## 2010-08-09 NOTE — Discharge Summary (Signed)
[name], TRAINER                 ACCOUNT NO.:  [id number]   MEDICAL RECORD NO.:  [id number]          PATIENT TYPE:  INP   LOCATION:  1536                         FACILITY:  [facility]   PHYSICIAN:  Maisie Fus A. Cornett, M.D.DATE OF BIRTH:  [birth year]   DATE OF ADMISSION:  08/04/2007  DATE OF DISCHARGE:  08/06/2007                               DISCHARGE SUMMARY   ADMISSION DIAGNOSES:  1. Dehydration.  2. Protein calorie malnutrition.  3. Rectal bleeding.  4. Type 2 diabetes mellitus.   DISCHARGE DIAGNOSES:  1. Dehydration.  2. Protein calorie malnutrition.  3. Rectal bleeding.  4. Type 2 diabetes mellitus.  5. Anemia.   PROCEDURES PERFORMED:  None.   BRIEF HISTORY:  The patient is a 75 year old male about 2-1/2 weeks  after colostomy closure.  He has had problems with crampy abdominal pain  and bleeding per rectum.  He was admitted on Sunday for further workup  of this.  On examination, he had very large hemorrhoids that were  bleeding.  He was brought in for rehydration and for nutritional  supplementation as his diet has not been very good and his admitting  albumin was 2.6.   HOSPITAL COURSE:  The patient's hospital course was unremarkable.  With  IV hydration, he felt much better.  He was still having rectal bleeding  but his hemoglobin remained stable around 10 over a 48-hour period  during the bleeding episodes.  This was felt to be secondary to his  hemorrhoids and also being a postoperative patient having some  recalibrations from that standpoint.  His bowel function improved.  His  crampy abdominal pain resolved, his diet improved and his appetite  improved while in the hospital after being rehydrated.  He was placed on  Glucerna supplements which helped.  He is discharged on hospital day #2  in improved condition.   DISCHARGE INSTRUCTIONS:  He will follow up in 1 week.  He will go home  on his home medications to include ProctoFoam HC per rectum every 8  hours.  He  will also resume his home medicines of amlodipine, benazepril  10/20 daily, labetalol 300 mg daily, metformin 5 mg p.o. b.i.d., Vytorin  10/20 daily.  His dicyclomine  was stopped.  He will also start a stool softener and hold his MiraLax  only for constipation issues.  He will be given a recommendation for  Glucerna supplements 1 can p.o. t.i.d.   CONDITION ON DISCHARGE:  Improved.      Thomas A. Cornett, M.D.  Electronically Signed     TAC/MEDQ  D:  08/06/2007  T:  08/06/2007  Job:  [id number]

## 2010-08-12 NOTE — H&P (Signed)
   [name], [name]                           ACCOUNT NO.:  [id number]   MEDICAL RECORD NO.:  [id number]                   PATIENT TYPE:  INP   LOCATION:  2550                                 FACILITY:  [facility]   PHYSICIAN:  Myrtie Neither, M.D.                 DATE OF BIRTH:  [birth year]   DATE OF ADMISSION:  06/11/2002  DATE OF DISCHARGE:                                HISTORY & PHYSICAL   CHIEF COMPLAINT:  Painful deformed right ankle.   HISTORY OF PRESENT ILLNESS:  This is a 75 year old who had been followed in  the office for avascular necrosis of his right hip, but the patient more  recently in the past few weeks developed sudden onset of severe pain and  deformity of the right ankle, fever, and swelling.  X-ray revealed avascular  necrosis and subluxation of his talus and right ankle with marked  degenerative changes and collapse of the talar neck.  The patient denies any  history of any trauma or history of alcohol abuse.  The patient's evaluation  in the office of the left ankle also revealed avascular changes involving  his left talus also.   PAST MEDICAL HISTORY:  1. No previous surgery.  2. No previous hospitalizations.  3. Hypercholesterolemia.  4. Avascular necrosis, right hip and bilateral ankles.  5. High blood pressure.   SOCIAL HISTORY:  The patient lives with his wife.   HABITS:  None.   MEDICATIONS:  Indomethacin, Lotrel, Lortab, aspirin, vitamin E, and  multivitamins.   ALLERGIES:  None known.   FAMILY HISTORY:  No history of high blood pressure, diabetes, or severe  joint problems.   PHYSICAL EXAMINATION:  VITAL SIGNS:  Temperature 97.4, pulse 72,  respirations 20, blood pressure 159/88.  Height 74 inches, weight 128.  HEENT:  Normocephalic.  Eyes:  Conjunctivae and sclerae clear.  NECK:  Supple.  CHEST:  Clear.  CARDIAC:  S1, S2, regular.  EXTREMITIES:  Right ankle is with varus deformity, cavus foot, tender  swelling over the lateral malleolus.   Pulses intact.  Right hip: Ankylotic,  limited range of motion, but nontender.  Left ankle:  Supple, range of  motion is good, nontender.    IMPRESSION:  Avascular necrosis with subluxation of the right ankle.   PLAN:  Right ankle fusion.                                               Myrtie Neither, M.D.    AC/MEDQ  D:  06/11/2002  T:  06/11/2002  Job:  [id number]

## 2010-08-12 NOTE — Assessment & Plan Note (Signed)
[facility]                        [place] OFFICE NOTE   NAME:Strehl, KALANI STHILAIRE                        MRN:          [id number]  DATE:07/16/2006                            DOB:          [birth year]    He was seen on July 16, 2006 for followup of a [facility]  visit on July 13, 2006.  He awoke approximately 4 a.m. to urinate, and  while in the bathroom felt aching across his neck and shoulders.  He  described it as a tremendous weight, as if I had to bend over.  He  denied nausea, diaphoresis, or chest pain.  He also denied having to  strain at urination.  Had no syncope.   He did walk that morning without symptoms, but subsequently felt weak  and lethargic.   His glucose was 68 at Urgent Care; he was asked to monitor the  early  a.m. sugars on July 14, 2006 and July 15, 2006.  The values were 77  and 85.   He has been following a healthy eating diet, and has lost an  additional 4.4 pounds to 122.2.  Fasting glucoses have been 79 to 125.  His 2-hour postprandial glucoses have been 90 to 153.  Despite this, his  A1c on June 01, 2006 was 6.4, up from a value of 5.6 in August 2007,  indicating significant loss of control of diabetes.  At that time, liver  panel, microalbumin, potassium, and creatinine were all normal.   He has been supplementing protein to help prevent weight loss, but this  has resulted on some bowel dysfunction.  With stopping the protein did  note some improvement in his bowel.   He did have a colonoscopy in 2005, which revealed colon polyps and  diverticulosis .   He is presently on:  1. Vytorin 10/20 one half daily.  2. Labetalol 300 mg daily.  3. Metformin  initially twice a day, but this was decreased to once      daily after his visit to the Urgent Care.  4. He is also on amlodipine and benazepril 10/20.   His blood pressure is 152/86.  He has no lymphadenopathy or organomegaly.  The thyroid is  slightly  asymmetric with the right greater than the left without nodules.  He has an S4 with a regular rhythm.  CHEST:  Clear.  ABDOMEN:  Non-tender.  Neuropsychiatric exam is negative.  An EKG will be performed because of the neck and shoulder symptoms he  had on July 13, 2006.  Because of the weight loss, stool cards will be  provided, and a CBC, differential, and thyroid functions performed.   Metformin would not be expected to cause significant hypoglycemia.  If  the issue persists, then consideration would be given to other agents  which are less likely to cause hypoglycemia, such as Januvia.   He will have fasting lipids and A1c repeated in late June.  As PSA will  be also be checked at that time.     Titus Dubin. Alwyn Ren, MD,FACP,FCCP  Electronically Signed  [facility]/MedQ  DD: 07/16/2006  DT: 07/16/2006  Job #: [id number]

## 2010-08-12 NOTE — Op Note (Signed)
[name], [name]                 ACCOUNT NO.:  [id number]   MEDICAL RECORD NO.:  [id number]          PATIENT TYPE:  OIB   LOCATION:  1519                         FACILITY:  [facility]   PHYSICIAN:  Georges Lynch. Gioffre, M.D.DATE OF BIRTH:  Jul 26, 1935   DATE OF PROCEDURE:  12/30/2004  DATE OF DISCHARGE:                                 OPERATIVE REPORT   PREOPERATIVE DIAGNOSIS:  Complete retracted tear of the rotator cuff tendon,  right shoulder.   POSTOPERATIVE DIAGNOSIS:  Complete retracted tear of the rotator cuff  tendon, right shoulder.   OPERATION:  1.  Partial acromionectomy, right shoulder.  2.  Repair of a complete retracted tear of the rotator cuff utilizing      initially three Mitek four-pronged anchor sutures to suture the tendon      down and a Restore tendon graft to reinforce the tendon distally with      additional Mitek sutures.   DESCRIPTION OF PROCEDURE:  Under general anesthesia, a routine orthopedic  prep and draping in the right upper extremity was carried out.  The patient  first had 1 gram of IV Ancef.  He also had an interscalene nerve block on  the right prior to taking him back to surgery.  Once in the operating room,  he was administered a general anesthetic and a routine orthopedic prep and  draping the right shoulder was carried out.  At this time, an incision was  made over the anterior aspect of the right shoulder and bleeders identified  and cauterized.  I partially detached the deltoid tendon of the acromion.  I  made a small incision in the proximal deltoid.  The self-retaining  retractors were inserted and immediately going into the joint, the joint  fluid came out of the joint.  I then noted that he had a complete retracted  tear of the rotator cuff tendon.  He had a large thickened subdeltoid bursa  that I excised. I then protected the underlying cuff with a Bennett  retractor.  I did a partial acromionectomy with the oscillating saw and then  evened out the undersurface of the acromion with the bur.  Following that, I  then burred down the lateral aspect of the humeral head.  I removed the  articular cartilage off laterally, made a nice little groove there and then  inserted three four-pronged Mitek anchor sutures into the proximal humerus  and then brought the tendon forward under tension and sutured it down in  place. I then reinforced the distal aspect of the tendon with a capital  Restore tendon graft with two additional Mitek sutures.  I thoroughly  irrigated out the area and then closed the wound in layers in the usual  fashion.  The proximal tendon, deltoid tendon muscle was approximated with  #1 Ethibond suture.  The subcu was closed with 2-0 Vicryl, skin with metal  staples.  A sterile Neosporin dressing was applied. He was then placed in a  shoulder immobilizer.           ______________________________  Georges Lynch Darrelyn Hillock, M.D.  RAG/MEDQ  D:  12/30/2004  T:  12/30/2004  Job:  [id number]

## 2010-08-12 NOTE — Op Note (Signed)
[name], [name]                           ACCOUNT NO.:  [id number]   MEDICAL RECORD NO.:  [id number]                   PATIENT TYPE:  INP   LOCATION:  2550                                 FACILITY:  [facility]   PHYSICIAN:  Myrtie Neither, M.D.                 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   ANESTHESIA:  General.   PROCEDURE:  Right ankle fusion; also, use of bone grafting putty as well as  granules, with use of DePuy bone grafting substance.   DESCRIPTION OF PROCEDURE:  The patient was taken to the operating room after  given adequate preoperative medication, given general anesthesia and  intubated.  The right lower extremity was prepped with duraprep and draped  in sterile manner.  Tourniquet and Bovie used for hemostasis.  C-arm used to  visualize position of the fusion.  Lateral incision was made over the  lateral malleolus going through the skin and subcutaneous tissue.  A 45-  degree angle osteotomy was made into the lateral malleolus; this was  resected.  This exposed the ankle joint.  The ankle joint itself had  absolutely no cartilage involving the talus or the distal surfaces of the  tibia.  With an oscillating saw the distal tibia and the ulnar talus was  prepped with parallel cuts.  This was done down to a good bleeding surface.  A separate medial incision was made to get down to the median malleolus.  The articular surface was resected, and with the oscillating saw as well as  burr adequate cuts to allow the distal tibia and the talus to lie flush were  made.  Copious and abundant irrigation was done with antibiotic solution.  After adequate prepping of the distal tibia and the dome of talus  surface  and good apposition, a K-wire was placed across the fusion site as a  stabilizer followed by placement of two cancellous screws.  It was found to  be good and stable.  Bone grafting mixture with paste using the own  patient's serum, with platelet-rich and platelet-poor serum mixed in with  the granules as well as mixed in with the putty, which was able to be packed  around the graft site posteriorly, medially, and laterally.  Hemostasis was  obtained.  The tourniquet was let down.  After adequate placement of the  bone grafting material, the C-arm was used again to visualize position which  was found to be very good.  Next, the wounds were closed with 2-0 Vicryl for  the subcutaneous and skin staples for the skin.  Bulky compressive dressing  was applied and a short-leg fiberglass  cast was applied.  The patient  tolerated the procedure quite well and went to the recovery room in stable  and satisfactory condition.                                               Myrtie Neither, M.D.    AC/MEDQ  D:  06/11/2002  T:  06/11/2002  Job:  [id number]

## 2010-09-06 ENCOUNTER — Other Ambulatory Visit: Payer: Self-pay | Admitting: Orthopedic Surgery

## 2010-09-06 ENCOUNTER — Encounter (HOSPITAL_COMMUNITY): Payer: Medicare Other

## 2010-09-06 LAB — URINALYSIS, ROUTINE W REFLEX MICROSCOPIC
Glucose, UA: NEGATIVE mg/dL
Leukocytes, UA: NEGATIVE
Nitrite: NEGATIVE
Specific Gravity, Urine: 1.016 (ref 1.005–1.030)
pH: 7 (ref 5.0–8.0)

## 2010-09-06 LAB — COMPREHENSIVE METABOLIC PANEL
AST: 29 U/L (ref 0–37)
Albumin: 4 g/dL (ref 3.5–5.2)
Alkaline Phosphatase: 83 U/L (ref 39–117)
Chloride: 97 mEq/L (ref 96–112)
Potassium: 3.3 mEq/L — ABNORMAL LOW (ref 3.5–5.1)
Sodium: 139 mEq/L (ref 135–145)
Total Bilirubin: 0.4 mg/dL (ref 0.3–1.2)
Total Protein: 7.7 g/dL (ref 6.0–8.3)

## 2010-09-06 LAB — CBC
HCT: 46.5 % (ref 39.0–52.0)
Hemoglobin: 15.2 g/dL (ref 13.0–17.0)
MCH: 27 pg (ref 26.0–34.0)
MCV: 82.7 fL (ref 78.0–100.0)
Platelets: 236 10*3/uL (ref 150–400)
RBC: 5.62 MIL/uL (ref 4.22–5.81)

## 2010-09-06 LAB — SURGICAL PCR SCREEN
MRSA, PCR: NEGATIVE
Staphylococcus aureus: NEGATIVE

## 2010-09-06 LAB — DIFFERENTIAL
Eosinophils Absolute: 0.1 10*3/uL (ref 0.0–0.7)
Lymphocytes Relative: 26 % (ref 12–46)
Lymphs Abs: 1.4 10*3/uL (ref 0.7–4.0)
Monocytes Relative: 7 % (ref 3–12)
Neutrophils Relative %: 65 % (ref 43–77)

## 2010-09-06 LAB — ABO/RH: ABO/RH(D): O POS

## 2010-09-06 LAB — APTT: aPTT: 31 seconds (ref 24–37)

## 2010-09-12 ENCOUNTER — Inpatient Hospital Stay (HOSPITAL_COMMUNITY): Payer: Medicare Other

## 2010-09-12 ENCOUNTER — Inpatient Hospital Stay (HOSPITAL_COMMUNITY)
Admission: RE | Admit: 2010-09-12 | Discharge: 2010-09-15 | DRG: 470 | Disposition: A | Discharge status: Home Health | Payer: Medicare Other | Source: Ambulatory Visit | Attending: Orthopedic Surgery | Admitting: Orthopedic Surgery

## 2010-09-12 DIAGNOSIS — M1610 Unilateral primary osteoarthritis, unspecified hip: Principal | ICD-10-CM | POA: Diagnosis present

## 2010-09-12 DIAGNOSIS — I10 Essential (primary) hypertension: Secondary | ICD-10-CM | POA: Diagnosis present

## 2010-09-12 DIAGNOSIS — M169 Osteoarthritis of hip, unspecified: Principal | ICD-10-CM | POA: Diagnosis present

## 2010-09-12 DIAGNOSIS — Z8546 Personal history of malignant neoplasm of prostate: Secondary | ICD-10-CM

## 2010-09-12 DIAGNOSIS — Z01812 Encounter for preprocedural laboratory examination: Secondary | ICD-10-CM

## 2010-09-12 DIAGNOSIS — E119 Type 2 diabetes mellitus without complications: Secondary | ICD-10-CM | POA: Diagnosis present

## 2010-09-12 LAB — HEMOGLOBIN AND HEMATOCRIT, BLOOD: HCT: 34.7 % — ABNORMAL LOW (ref 39.0–52.0)

## 2010-09-12 LAB — GLUCOSE, CAPILLARY: Glucose-Capillary: 110 mg/dL — ABNORMAL HIGH (ref 70–99)

## 2010-09-12 LAB — TYPE AND SCREEN: Antibody Screen: NEGATIVE

## 2010-09-12 NOTE — H&P (Addendum)
[name], [name] NO.:  [id number]  MEDICAL RECORD NO.:  [id number]  LOCATION:  0008                         FACILITY:  [facility]  PHYSICIAN:  Rozell Searing, [facility]    DATE OF BIRTH:  [birth year]  DATE OF ADMISSION:  09/12/2010 DATE OF DISCHARGE:                             HISTORY & PHYSICAL   CHIEF COMPLAINT:  Left hip pain.  BRIEF HISTORY:  [name] has been followed by Dr. Darrelyn Hillock for worsening pain in his left hip since April.  He states that the onset of the pain was several years ago, becoming progressively worse, especially over the last 6 months.  He is now at a point where he is very limited in his activity.  He now presents for a left total hip arthroplasty.  He has been cleared for surgery by his primary care physician, Dr. Alwyn Ren.  MEDICATION ALLERGIES: 1. BENAZEPRIL and this includes all ACE INHIBITORS and ARBs. 2. CIPRO and this causes tongue swelling.  PRIMARY CARE PHYSICIAN:  Titus Dubin. Alwyn Ren, MD,FACP, FCCP.  UROLOGIST:  Maretta Bees. Vonita Moss, M.D.  CURRENT MEDICATIONS: 1. Amlodipine. 2. Metformin. 3. Hydrochlorothiazide. 4. Pravastatin. 5. MacularProtect. 6. Aleve. 7. Aspirin. 8. Vitamin D. 9. Calcium.  The patient will discontinue Aleve and aspirin prior to surgery.  Note, [name] would like [place] to know that he has had a bad reaction with anesthesia in the past during prostate surgery and had difficulty waking him up following this surgery.  PAST MEDICAL HISTORY: 1. End-stage arthritis of the left hip. 2. Macular degeneration. 3. Cataracts. 4. Hypertension. 5. Hemorrhoids. 6. Diverticulosis. 7. Prostate disease. 8. Non-insulin-dependent diabetes. 9. History of prostate cancer, Dr. Vonita Moss does monitor his PSA     regularly. 10.Arthritis.  PAST SURGICAL HISTORY: 1. Right ankle fusion in 2004. 2. Right shoulder rotator cuff repair in 2009. 3. Colectomy and then colostomy reversal. 4. Double hernia repair. 5. Prostate  biopsy.  FAMILY HISTORY:  Father passed at age of 55, he had renal failure. Mother passed at age of 59, she had congestive heart failure.  SOCIAL HISTORY:  The patient is married.  He works as a Chief of Staff.  He admits past use of tobacco products.  He quit smoking in 1991.  He lives at home with his wife.  He does plan to go home following his hospital stay.  REVIEW OF SYSTEMS:  GENERAL:  Negative for fevers, chills or weight change.  HEENT AND NEUROLOGIC:  Negative for headache, blurred vision or insomnia.  DERMATOLOGIC:  Negative for rash or lesion.  RESPIRATORY: Negative shortness of breath at rest or with exertion.  CARDIOVASCULAR: Negative chest pain or palpitations.  GI:  Negative for nausea, vomiting, diarrhea.  GU: Positive for urinating at nighttime.  Negative for pain with urination or urinary frequency.  MUSCULOSKELETAL: Positive for joint pain and joint swelling.  PHYSICAL EXAMINATION:  VITAL SIGNS:  Pulse 84, respirations 18, blood pressure 138/80 in the left arm. GENERAL:  [name] is alert and oriented x3.  He is well-developed, well- nourished, in no apparent distress.  He is a very pleasant 75 year old male.  He has stated height of 5 feet 11 inches and  a stated weight of 130 pounds.  He is accompanied today by his wife. HEENT:  Normocephalic, atraumatic.  Extraocular movements intact.  The patient wears glasses. NECK:  Supple.  Full range of motion without lymphadenopathy. CHEST:  Lungs are clear to auscultation bilaterally without wheezes, rhonchi or rales. HEART:  Regular rate and rhythm without murmur. ABDOMEN:  Bowel sounds present in all 4 quadrants. EXTREMITIES:  Left hip, he has a flexion contracture.  He has painful and limited range of motion. SKIN:  Unremarkable. NEUROLOGIC:  Intact.  Peripheral, vascular and carotid pulses 2+ bilaterally without bruit.  RADIOGRAPHS:  AP and lateral views of the left hip reveal end-stage arthritic  changes.  IMPRESSION:  End-stage arthritis of the left hip.  PLAN:  Left total hip arthroplasty to be performed by Dr. Darrelyn Hillock.     Rozell Searing, [facility]     LD/MEDQ  D:  09/12/2010  T:  09/12/2010  Job:  [id number]  Electronically Signed by Rozell Searing  on 09/12/2010 09:03:38 AM Electronically Signed by Ranee Gosselin M.D. on 10/01/2010 08:18:39 AM

## 2010-09-13 LAB — PROTIME-INR
INR: 1.19 (ref 0.00–1.49)
Prothrombin Time: 15.4 seconds — ABNORMAL HIGH (ref 11.6–15.2)

## 2010-09-13 LAB — GLUCOSE, CAPILLARY
Glucose-Capillary: 163 mg/dL — ABNORMAL HIGH (ref 70–99)
Glucose-Capillary: 127 mg/dL — ABNORMAL HIGH (ref 70–99)
Glucose-Capillary: 178 mg/dL — ABNORMAL HIGH (ref 70–99)

## 2010-09-13 LAB — HEMOGLOBIN AND HEMATOCRIT, BLOOD
HCT: 32.5 % — ABNORMAL LOW (ref 39.0–52.0)
Hemoglobin: 10.8 g/dL — ABNORMAL LOW (ref 13.0–17.0)

## 2010-09-14 LAB — GLUCOSE, CAPILLARY: Glucose-Capillary: 122 mg/dL — ABNORMAL HIGH (ref 70–99)

## 2010-09-14 LAB — HEMOGLOBIN AND HEMATOCRIT, BLOOD
HCT: 31.7 % — ABNORMAL LOW (ref 39.0–52.0)
Hemoglobin: 10.5 g/dL — ABNORMAL LOW (ref 13.0–17.0)

## 2010-09-15 LAB — PROTIME-INR: Prothrombin Time: 23 seconds — ABNORMAL HIGH (ref 11.6–15.2)

## 2010-09-21 NOTE — Discharge Summary (Addendum)
[name], [name] NO.:  [id number]  MEDICAL RECORD NO.:  [id number]  LOCATION:  1617                         FACILITY:  [facility]  PHYSICIAN:  Rozell Searing, [facility]    DATE OF BIRTH:  [birth year]  DATE OF ADMISSION:  09/12/2010 DATE OF DISCHARGE:  09/15/2010                              DISCHARGE SUMMARY   ADMITTING DIAGNOSES: 1. End-stage arthritis of the left hip. 2. Macular degeneration. 3. Cataracts. 4. Hypertension. 5. Hemorrhoids. 6. Diverticulosis. 7. Prostate disease. 8. Non-insulin dependent diabetes. 9. History of prostate cancer. 10.Arthritis.  DISCHARGE DIAGNOSES: 1. End-stage arthritis of the left hip status post left total hip     arthroplasty. 2. Macular degeneration. 3. Cataracts. 4. Hypertension. 5. Hemorrhoids. 6. Diverticulosis. 7. Prostate disease. 8. Non-insulin dependent diabetes. 9. History of prostate cancer. 10.Arthritis.  LABORATORY DATA:  Serial hemoglobin and hematocrit were monitored throughout the patient's hospital stay.  On postoperative day #1, the patient's hemoglobin had dropped to 10.8; postop day #2 was at 10.5; and postop day #3 he was at a low of 9.9.  The patient did not require transfusion throughout his hospital stay.  His INR was also monitored and he was therapeutic by postoperative day #3, he was at 2.0.  PROCEDURE:  On September 12, 2010, the patient was taken to the operating room by surgeon Dr. Ranee Gosselin, assistant Dr. Durene Romans and Rozell Searing PA-C, under general anesthesia.  The patient underwent a left total hip arthroplasty.  Routine orthopedic prep and drape were carried out.  The patient received IV antibiotics preoperatively.  There were no complications with the procedure.  He was returned to the recovery room in satisfactory condition.  Postoperative radiographs revealed a total hip arthroplasty without adverse features.  HOSPITAL COURSE:  On September 12, 2010, [name] was admitted to  [facility].  He underwent the above-stated procedure without complications.  After spending adequate time in the recovery room, he was then taken to the orthopedic floor for further recovery.  He was placed on reduced dose PCA Dilaudid as well as muscle relaxants for pain control.  Postoperative day #1, the PCA was discontinued and he was able to ambulate with physical therapy.  On the first day, he was able to ambulate 114 feet in the morning session; on postoperative day #2, he progressed and was able to go 85 feet in the morning and 100 in the afternoon using the rolling walker with minimal assistance. Postoperative day #3, the patient was able to ambulate 25 feet in the morning and 80 feet in the afternoon.  He did work on his stairs and transfers in the afternoon session.  Dressing was changed on postoperative day #2, wound was well approximated, no signs of infection.  The patient was discharged from [facility] on September 15, 2010, postoperative day #3.  DISCHARGE MEDICATIONS: 1. Aluminum hydroxide suspension. 2. Amlodipine. 3. Calcium carbonate. 4. Vitamin D. 5. Hydrochlorothiazide. 6. Metformin. 7. Robaxin. 8. Multivitamin. 9. Simvastatin. 10.Coumadin. 11.Oxycodone.  DISCHARGE MEDICATIONS:  ACTIVITY:  He will increase his activity slowly.  The patient is 50% weightbearing, he should use a walker at all times.  DIET:  An  1800-calorie diabetic diet.  WOUND CARE:  Daily dressing change.  FOLLOWUP:  He will need to follow up with Dr. Darrelyn Hillock 2 weeks from the day of surgery.  CONDITION ON DISCHARGE:  Improving.     Rozell Searing, [facility]     LD/MEDQ  D:  09/21/2010  T:  09/21/2010  Job:  [id number]  Electronically Signed by Rozell Searing  on 09/21/2010 03:08:49 PM Electronically Signed by Ranee Gosselin M.D. on 10/01/2010 08:18:36 AM

## 2010-09-27 ENCOUNTER — Other Ambulatory Visit: Payer: Self-pay | Admitting: Internal Medicine

## 2010-10-01 NOTE — Op Note (Signed)
NAMEJMARION, CHRISTIANO NO.:  [id number]  MEDICAL RECORD NO.:  [id number]  LOCATION:  1617                         FACILITY[facility]  [facility]  PHYSICIAN:  Georges Lynch. Iviona Hole, M.D.DATE OF BIRTH:  [birth year]  DATE OF PROCEDURE: DATE OF DISCHARGE:                              OPERATIVE REPORT   He is 75 year old with severe bilateral degenerative arthritic hips, the left greater than right.  The operation was a left total hip arthroplasty utilizing DePuy system.  The sizes used, the cup was a size 54 mm outside diameter, the insert was an AltrX polyethylene liner +4 neutral, 36 mm inside diameter, and we utilized 2 cancellus bone screws 6.5 mm x 30 mm in length and the other one was 6.5 mm x 35 mm in length. We utilized the hole eliminator as well in the acetabular shell.  The head was a +1.5 36 mm diameter ceramic head.  The stem was a high offset size 3 Tri-Lock stem.  SURGEON:  Georges Lynch. Darrelyn Hillock, MD  ASSISTANTS:  Madlyn Frankel. Charlann Boxer, MD, and Rozell Searing, [facility]  PROCEDURE:  Under general anesthesia, the patient on the right side, left side up. After sterile prepping and draping, the time-out was carried out.  A posterolateral approach to the left hip was carried out. Bleeders were identified and cauterized.  Self-retaining tractors were inserted.  I then split the iliotibial band and a portion of the gluteus maximus tendon was incised from the femoral neck because the extreme tightness of the hip.  He had an extreme contracture about this hip, severe arthritic hip with a mild protrusio.  At this time, I went down and protected the sciatic nerve and partially detached the external rotators including piriformis.  I then did a capsulectomy, dislocated the head, amputated the femoral head at the appropriate neck length.  At this particular time, I then utilized my box osteotome to remove the cancellus bone for the femoral neck and then widening reamer.  Following that the  canal finder was inserted down the canal which was extremely tight, I then rasped the canal up to a size 3 Tri-Lock stem.  Thoroughly irrigated out the canal during the procedure, I then packed the canal with a sponge which we later removed and then attention was directed to the acetabulum, I then completed capsulectomy, and reamed the acetabulum up for a 54 mm diameter cup.  At this time, we thoroughly irrigated out the acetabulum.  We had good bleeding bone.  I inserted the permanent acetabular cup and then affixed it with 2 cancellus screws, as mentioned above.  Following that the hole eliminator was inserted.  We did first utilized a Charnley guide to make sure we had the appropriate angle which we did.  We then inserted our permanent AltrX cup within acetabular shell, fixed it to the shell.  We then removed the packing from the femoral stem and inserted our femoral canal that has removed the packing from the canal, I then inserted our permanent size 3 Tri- Lock stem.  We then went through various ball sizes.  We first tried a standard stem, then we finally went to the high offset stem.  We went through trials first and we selected a 1.5 mm C-tapered head, reduced the hip, had excellent function and excellent stability, and good leg lengths as well.  Note, we had arthritis of his opposite right hip and that hip will be redone in future.  Following that, I removed the trial components from the femoral canal and inserted my permanent size 3 Tri- Lock stem high offset.  I then went through trials again with a 1.5 mm ball length and once again we had excellent stability and good leg length as well.  I then removed the trial ball and inserted my permanent ceramic head +1.5 mm in length and reduced the hip after we made sure there was no soft tissue interposed in the hip in the socket.  We then reduced hip, took the hip through motion once, again had excellent stability and excellent leg  length.  I then irrigated the wound out, inserted some thrombin-soaked Gelfoam in the subacetabular space and closed the wound layers in usual fashion.  Skin was closed with metal staples and a sterile Neosporin dressing was applied.          ______________________________ Georges Lynch Darrelyn Hillock, M.D.     RAG/MEDQ  D:  09/12/2010  T:  09/13/2010  Job:  [id number]  cc:   Titus Dubin. Alwyn Ren, MD,FACP,FCCP [phone] [place] [address]  Electronically Signed by Ranee Gosselin M.D. on 10/01/2010 08:18:38 AM

## 2010-10-20 ENCOUNTER — Other Ambulatory Visit: Payer: Self-pay

## 2010-10-20 MED ORDER — HYDROCHLOROTHIAZIDE 12.5 MG PO CAPS: 12.5000 mg | ORAL_CAPSULE | Freq: Every day | ORAL | Status: DC

## 2010-10-20 MED ORDER — METFORMIN HCL 1000 MG PO TABS: ORAL_TABLET | ORAL | Status: DC

## 2010-10-20 NOTE — Telephone Encounter (Signed)
RX's sent the pharmacy

## 2010-12-19 ENCOUNTER — Other Ambulatory Visit: Payer: Self-pay | Admitting: Internal Medicine

## 2010-12-20 LAB — COMPREHENSIVE METABOLIC PANEL
ALT: 17
AST: 27
Albumin: 2.9 — ABNORMAL LOW
CO2: 27
Calcium: 7.9 — ABNORMAL LOW
Chloride: 103
Creatinine, Ser: 1.04
GFR calc Af Amer: >60
GFR calc non Af Amer: >60
Sodium: 138
Total Bilirubin: 0.7

## 2010-12-20 LAB — DIFFERENTIAL
Eosinophils Absolute: 0.3
Eosinophils Relative: 5
Lymphocytes Relative: 30
Lymphs Abs: 1.5
Monocytes Absolute: 0.5

## 2010-12-20 LAB — CBC
HCT: 41.3
Hemoglobin: 12.7 — ABNORMAL LOW
Hemoglobin: 13.8
MCHC: 33.2
MCV: 83.4
MCV: 84.3
Platelets: 220
RBC: 4.54
RDW: 13.6
RDW: 13.7
WBC: 4.9

## 2010-12-20 LAB — BASIC METABOLIC PANEL
BUN: 16
Chloride: 105
GFR calc non Af Amer: >60
Glucose, Bld: 84
Potassium: 4.2
Sodium: 146 — ABNORMAL HIGH

## 2011-01-02 LAB — URINALYSIS, ROUTINE W REFLEX MICROSCOPIC
Bilirubin Urine: NEGATIVE
Ketones, ur: NEGATIVE
Specific Gravity, Urine: 1.015
pH: 6.5

## 2011-01-02 LAB — COMPREHENSIVE METABOLIC PANEL
ALT: 22
AST: 35
Albumin: 2 — ABNORMAL LOW
Albumin: 3.8
Alkaline Phosphatase: 56
BUN: 10
BUN: 16
CO2: 26
Calcium: 9.4
Chloride: 101
Creatinine, Ser: 0.89
Creatinine, Ser: 1.3
GFR calc Af Amer: >60
GFR calc non Af Amer: 55 — ABNORMAL LOW
Glucose, Bld: 120 — ABNORMAL HIGH
Potassium: 4.5
Sodium: 137
Total Bilirubin: 0.8
Total Bilirubin: 1.3 — ABNORMAL HIGH
Total Protein: 5.1 — ABNORMAL LOW
Total Protein: 7

## 2011-01-02 LAB — CBC
HCT: 34.5 — ABNORMAL LOW
HCT: 35.2 — ABNORMAL LOW
HCT: 36.8 — ABNORMAL LOW
HCT: 39.6
Hemoglobin: 11.8 — ABNORMAL LOW
Hemoglobin: 12.1 — ABNORMAL LOW
Hemoglobin: 12.1 — ABNORMAL LOW
Hemoglobin: 13.4
MCHC: 33
MCHC: 33.8
MCHC: 34.6
MCV: 83.7
MCV: 84.7
Platelets: 215
Platelets: 272
RBC: 4.14 — ABNORMAL LOW
RBC: 4.24
RBC: 4.73
RDW: 13.8
RDW: 13.8
RDW: 13.9
RDW: 14.5
WBC: 14.5 — ABNORMAL HIGH

## 2011-01-02 LAB — DIFFERENTIAL
Basophils Absolute: 0
Basophils Relative: 0
Eosinophils Absolute: 0 — ABNORMAL LOW
Eosinophils Relative: 0
Lymphocytes Relative: 10 — ABNORMAL LOW
Lymphocytes Relative: 6 — ABNORMAL LOW
Lymphs Abs: 1.5
Monocytes Absolute: 0.7
Monocytes Absolute: 0.9
Monocytes Relative: 6
Monocytes Relative: 8
Neutro Abs: 12.2 — ABNORMAL HIGH
Neutro Abs: 7.6
Neutrophils Relative %: 84 — ABNORMAL HIGH

## 2011-01-02 LAB — CULTURE, BLOOD (ROUTINE X 2)

## 2011-01-02 LAB — BASIC METABOLIC PANEL
CO2: 26
Calcium: 8.5
GFR calc Af Amer: >60
GFR calc non Af Amer: >60
GFR calc non Af Amer: >60
Glucose, Bld: 77
Potassium: 3.5
Potassium: 3.8
Sodium: 136
Sodium: 140

## 2011-01-02 LAB — URINE MICROSCOPIC-ADD ON

## 2011-01-02 LAB — LIPASE, BLOOD: Lipase: 27

## 2011-06-29 DIAGNOSIS — H2510 Age-related nuclear cataract, unspecified eye: Secondary | ICD-10-CM | POA: Diagnosis not present

## 2011-07-15 ENCOUNTER — Other Ambulatory Visit: Payer: Self-pay | Admitting: Internal Medicine

## 2011-08-09 ENCOUNTER — Encounter: Payer: Medicare Other | Admitting: Internal Medicine

## 2011-08-14 ENCOUNTER — Other Ambulatory Visit: Payer: Self-pay

## 2011-08-14 MED ORDER — METFORMIN HCL 1000 MG PO TABS: ORAL_TABLET | ORAL | Status: DC

## 2011-08-14 MED ORDER — HYDROCHLOROTHIAZIDE 12.5 MG PO CAPS: 12.5000 mg | ORAL_CAPSULE | Freq: Every day | ORAL | Status: DC

## 2011-08-14 MED ORDER — AMLODIPINE BESYLATE 10 MG PO TABS: ORAL_TABLET | ORAL | Status: DC

## 2011-08-30 DIAGNOSIS — C61 Malignant neoplasm of prostate: Secondary | ICD-10-CM | POA: Diagnosis not present

## 2011-09-06 DIAGNOSIS — N401 Enlarged prostate with lower urinary tract symptoms: Secondary | ICD-10-CM | POA: Diagnosis not present

## 2011-09-06 DIAGNOSIS — C61 Malignant neoplasm of prostate: Secondary | ICD-10-CM | POA: Diagnosis not present

## 2011-09-11 ENCOUNTER — Telehealth: Payer: Self-pay | Admitting: *Deleted

## 2011-09-11 NOTE — Telephone Encounter (Signed)
Pt left vm on triage line advising that he ordered his DM testing supplies from the [employer] and wanted to let MD Alwyn Ren know that our office will be receiving  a form about getting these supplies sent to him, please note

## 2011-09-11 NOTE — Telephone Encounter (Signed)
Forms faxed, left Pt detail message.

## 2011-09-14 ENCOUNTER — Ambulatory Visit (INDEPENDENT_AMBULATORY_CARE_PROVIDER_SITE_OTHER): Payer: Medicare Other | Admitting: Internal Medicine

## 2011-09-14 ENCOUNTER — Encounter: Payer: Self-pay | Admitting: Internal Medicine

## 2011-09-14 VITALS — BP 140/80 | HR 85 | Temp 98.4°F | Resp 12 | Ht 70.0 in | Wt 131.4 lb

## 2011-09-14 DIAGNOSIS — Z0000 Encounter for general adult medical examination without abnormal findings: Secondary | ICD-10-CM | POA: Diagnosis not present

## 2011-09-14 DIAGNOSIS — I10 Essential (primary) hypertension: Secondary | ICD-10-CM

## 2011-09-14 DIAGNOSIS — E119 Type 2 diabetes mellitus without complications: Secondary | ICD-10-CM | POA: Diagnosis not present

## 2011-09-14 DIAGNOSIS — E785 Hyperlipidemia, unspecified: Secondary | ICD-10-CM | POA: Diagnosis not present

## 2011-09-14 LAB — CBC WITH DIFFERENTIAL/PLATELET
Basophils Absolute: 0 10*3/uL (ref 0.0–0.1)
HCT: 45.9 % (ref 39.0–52.0)
Lymphs Abs: 1.4 10*3/uL (ref 0.7–4.0)
Monocytes Relative: 7.8 % (ref 3.0–12.0)
Platelets: 230 10*3/uL (ref 150.0–400.0)
RDW: 14 % (ref 11.5–14.6)

## 2011-09-14 LAB — TSH: TSH: 0.88 u[IU]/mL (ref 0.35–5.50)

## 2011-09-14 LAB — HEPATIC FUNCTION PANEL
AST: 28 U/L (ref 0–37)
Albumin: 4.6 g/dL (ref 3.5–5.2)
Alkaline Phosphatase: 79 U/L (ref 39–117)
Total Protein: 7.7 g/dL (ref 6.0–8.3)

## 2011-09-14 LAB — MICROALBUMIN / CREATININE URINE RATIO: Microalb Creat Ratio: 4.3 mg/g (ref 0.0–30.0)

## 2011-09-14 MED ORDER — PRAVASTATIN SODIUM 20 MG PO TABS: ORAL_TABLET | ORAL | Status: DC

## 2011-09-14 MED ORDER — METFORMIN HCL 1000 MG PO TABS: ORAL_TABLET | ORAL | Status: DC

## 2011-09-14 MED ORDER — HYDROCHLOROTHIAZIDE 12.5 MG PO CAPS: 12.5000 mg | ORAL_CAPSULE | Freq: Every day | ORAL | Status: DC

## 2011-09-14 NOTE — Patient Instructions (Addendum)
Preventive Health Care: Exercise at least 30-45 minutes a day,  3-4 days a week.  Eat a low-fat diet with lots of fruits and vegetables, up to 7-9 servings per day.  Consume less than 40 grams of sugar per day from foods & drinks with High Fructose Corn Sugar as # 1,2,3 or # 4 on label. Health Care Power of Attorney & Living Will. Complete if not in place ; these place you in charge of your health care decisions. Please  schedule fasting Labs : BMET,Lipids, hepatic panel, CBC & dif, TSH, A1c, urine microalbumin. PLEASE BRING THESE INSTRUCTIONS TO FOLLOW UP  LAB APPOINTMENT.This will guarantee correct labs are drawn, eliminating need for repeat blood sampling ( needle sticks ! ). Diagnoses /Codes: 401.9,272.4,211.3,250.00.  Please try to go on My Chart within  24 hours of blood draw to allow me to release the results directly to you.

## 2011-09-14 NOTE — Progress Notes (Signed)
Subjective:    Patient ID: [name], male    DOB: 08-Feb-1936, 76 y.o.   MRN: [id number]  HPI Medicare Wellness Visit:  The following psychosocial & medical history were reviewed as required by Medicare.   Social history: caffeine: 2 cups coffee / day , alcohol:  no ,  tobacco use : quit 1991 & exercise : biking 5 X/ week.   Home & personal  safety / fall risk: no issues, activities of daily living:no limitations , seatbelt use : yes , and smoke alarm employment : yes .  Power of Attorney/Living Will status : ? POA needed  Vision ( as recorded per Nurse) & Hearing  evaluation :  Ophth exam 1 month ago. He questions some short-term memory loss. Hearing : see exam Orientation :oriented X 3 , memory & recall :2 of 3, spelling tested,and mood & affect : normal . Depression / anxiety: denied Travel history : 2003 [place] , immunization status : Shingles needed  , transfusion history:  no, and preventive health surveillance ( colonoscopies, BMD , etc as per protocol/ [facility]): colonoscopy up to date, Dental care:  Every 6 months . Chart reviewed &  Updated. Active issues reviewed & addressed.       Review of Systems HYPERTENSION: Disease Monitoring: Blood pressure average- 140/80  Chest pain, palpitations- ? Slight exertional chest pain       Dyspnea- no Medications: Compliance- yes  Lightheadedness,Syncope- no    Edema- no  DIABETES: Disease Monitoring:Blood Sugar ranges-93- 105 Polyuria/phagia/dipsia-no       Visual problems- no Medications: Compliance- yes Hypoglycemic symptoms-no  HYPERLIPIDEMIA: Disease Monitoring: See symptoms for Hypertension Medications: Compliance- yes Abd pain, bowel changes- no  Muscle aches- no         Objective:   Physical Exam Gen.: Thin but healthy and well-nourished in appearance. Alert, appropriate and cooperative throughout exam. Head: Normocephalic without obvious abnormalities; pattern alopecia  Eyes: No corneal or conjunctival  inflammation noted. Pupils equal round reactive to light and accommodation.  Extraocular motion intact. Vision grossly normal with lenses. Ears: External  ear exam reveals no significant lesions or deformities. Canals clear .TMs normal. Hearing is grossly normal bilaterally. Nose: External nasal exam reveals no deformity or inflammation. Nasal mucosa are pink and moist. No lesions or exudates noted. Septum  Deviated to R Mouth: Oral mucosa and oropharynx reveal no lesions or exudates. Teeth in good repair. Neck: No deformities, masses, or tenderness noted. Range of motion & Thyroid normal. Lungs: Normal respiratory effort; chest expands symmetrically. Lungs are clear to auscultation without rales, wheezes, or increased work of breathing. Heart: Normal rate and rhythm. Normal S1 and S2. No gallop,  or rub.Loud S 4 vs apical click; no murmur. Abdomen: Bowel sounds normal; abdomen soft and nontender. No masses, organomegaly or hernias noted. Genitalia: Dr Mena Goes                                                                        Musculoskeletal/extremities: No deformity or scoliosis noted of  the thoracic or lumbar spine. No clubbing, cyanosis, edema, or deformity noted. Range of motion  normal .Tone & strength  normal.Joints normal. Nail health  good. Vascular: Carotid, radial artery, dorsalis pedis and  posterior  tibial pulses are full and equal. No bruits present. Neurologic: Alert and oriented x3. Deep tendon reflexes symmetrical and normal.          Skin: Intact without suspicious lesions or rashes. Lymph: No cervical, axillary lymphadenopathy present. Psych: Mood and affect are normal. Normally interactive                                                                                         Assessment & Plan:  #1 Medicare Wellness Exam; criteria met ; data entered #2 Problem List reviewed ; Assessment/ Recommendations made Plan: see Orders

## 2011-09-14 NOTE — Addendum Note (Signed)
Addended by: Silvio Pate D on: 09/14/2011 03:41 PM   Modules accepted: Orders

## 2011-09-14 NOTE — Addendum Note (Signed)
Addended by: Maurice Small on: 09/14/2011 03:11 PM   Modules accepted: Orders

## 2011-09-14 NOTE — Progress Notes (Signed)
  Subjective:    Patient ID: [name], male    DOB: 21-May-1935, 76 y.o.   MRN: [id number]  HPI    Review of Systems     Objective:   Physical Exam        Assessment & Plan:   EKG reveals minor nonspecific ST-T changes. These may be slightly more prominent than on 08/02/10. I asked him to alert me if he is having progressive dyspnea on exertion or chest discomfort with exertion.  I have also asked him to discuss any memory issues with his wife & return if so for formal testing

## 2011-09-15 ENCOUNTER — Other Ambulatory Visit (INDEPENDENT_AMBULATORY_CARE_PROVIDER_SITE_OTHER): Payer: Medicare Other

## 2011-09-15 DIAGNOSIS — I10 Essential (primary) hypertension: Secondary | ICD-10-CM

## 2011-09-15 DIAGNOSIS — E119 Type 2 diabetes mellitus without complications: Secondary | ICD-10-CM

## 2011-09-15 DIAGNOSIS — E785 Hyperlipidemia, unspecified: Secondary | ICD-10-CM

## 2011-09-15 LAB — BASIC METABOLIC PANEL
BUN: 16 mg/dL (ref 6–23)
Calcium: 9.1 mg/dL (ref 8.4–10.5)
Creatinine, Ser: 1.1 mg/dL (ref 0.4–1.5)
GFR: 84.25 mL/min (ref >60.00)
Potassium: 3.5 mEq/L (ref 3.5–5.1)

## 2011-09-15 LAB — LIPID PANEL
Cholesterol: 136 mg/dL (ref 0–200)
VLDL: 10.2 mg/dL (ref 0.0–40.0)

## 2011-09-16 NOTE — Addendum Note (Signed)
Addended byMarga Melnick F on: 09/16/2011 08:30 AM   Modules accepted: Orders

## 2011-09-18 ENCOUNTER — Other Ambulatory Visit: Payer: Self-pay | Admitting: Internal Medicine

## 2011-10-10 ENCOUNTER — Ambulatory Visit (INDEPENDENT_AMBULATORY_CARE_PROVIDER_SITE_OTHER): Payer: Medicare Other

## 2011-10-10 DIAGNOSIS — Z23 Encounter for immunization: Secondary | ICD-10-CM | POA: Diagnosis not present

## 2011-10-10 DIAGNOSIS — Z2911 Encounter for prophylactic immunotherapy for respiratory syncytial virus (RSV): Secondary | ICD-10-CM

## 2011-12-11 ENCOUNTER — Ambulatory Visit (INDEPENDENT_AMBULATORY_CARE_PROVIDER_SITE_OTHER): Payer: Medicare Other

## 2011-12-11 DIAGNOSIS — Z23 Encounter for immunization: Secondary | ICD-10-CM | POA: Diagnosis not present

## 2012-01-04 DIAGNOSIS — H353190 Nonexudative age-related macular degeneration, unspecified eye, stage unspecified: Secondary | ICD-10-CM | POA: Diagnosis not present

## 2012-02-29 DIAGNOSIS — C61 Malignant neoplasm of prostate: Secondary | ICD-10-CM | POA: Diagnosis not present

## 2012-03-07 DIAGNOSIS — N401 Enlarged prostate with lower urinary tract symptoms: Secondary | ICD-10-CM | POA: Diagnosis not present

## 2012-03-07 DIAGNOSIS — C61 Malignant neoplasm of prostate: Secondary | ICD-10-CM | POA: Diagnosis not present

## 2012-03-08 ENCOUNTER — Telehealth: Payer: Self-pay | Admitting: Internal Medicine

## 2012-03-08 NOTE — Telephone Encounter (Signed)
Error wrg pt °

## 2012-03-11 ENCOUNTER — Other Ambulatory Visit: Payer: Self-pay | Admitting: Urology

## 2012-03-25 ENCOUNTER — Encounter (HOSPITAL_BASED_OUTPATIENT_CLINIC_OR_DEPARTMENT_OTHER): Payer: Self-pay | Admitting: *Deleted

## 2012-03-25 NOTE — Progress Notes (Signed)
Pt instructed npo p mn 03/28/12 x norvasc w sip of water.  To [facility] 1/4 @ 0730.  Needs pt. istat on arrival.  ekg in epic

## 2012-03-25 NOTE — Progress Notes (Signed)
Pt asked for clarification of preop levaquin administration.  Talked w/ [facility], pt to take (1) 500mg  tab bid (day before surgery, DOS, day after surgery) .  Pt informed.

## 2012-03-25 NOTE — H&P (Signed)
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 He feels empty. He has had no dysuria or hematuria.   Current PSA 3.01 (estimated PSAD = 0.08).   Past Medical History Problems  1. History of  Arthritis V13.4 2. History of  Diabetes Mellitus 250.00 3. History of  Esophageal Reflux 530.81 4. History of  Hypercholesterolemia 272.0 5. History of  Hypertension 401.9 6. History of  Nephrolithiasis V13.01  Surgical History Problems  1. History of  Ankle Incision 2. History of  Colon Surgery 3. History of  Rotator Cuff Repair  Current Meds 1. Aleve TABS; Therapy: (Recorded:15Aug2011) to 2. AmLODIPine Besylate 10 MG Oral Tablet; Therapy: 25Jun2013 to 3. Aspirin 81 MG Oral Tablet Delayed Release; Therapy: (Recorded:15Aug2011) to 4. Hydrochlorothiazide 12.5 MG Oral Capsule; Therapy: 11Aug2011 to 5. Macular Vitamin Benefit TABS; Therapy: (Recorded:12Dec2012) to 6. Multi-Vitamin TABS; Therapy: (Recorded:15Aug2011) to 7. Pravastatin Sodium 20 MG Oral Tablet; Therapy: (Recorded:15Aug2011) to  Allergies Medication  1. Lotrel CAPS 2. Cipro TABS  Family History Problems  1. Family history of  Death In The Family Father died age 45-renal failure 2. Family history of  Death In The Family Mother died age 45 heart failure 3. Family history of  Family Health Status Number Of Children 1 son  Social History Problems  1. Caffeine Use 2 per day 2. Former Smoker V15.82 Smoked fir 30 yrs and quit 1991 3. Marital History - Currently Married 4. Occupation: Retired Nurse, children's  5. History of  Alcohol Use  Vitals Vital Signs [Data Includes: Last 1  Day]  12Dec2013 10:04AM  Blood Pressure: 137 / 80 Temperature: 97.7 F Heart Rate: 94  Physical Exam Constitutional: Well nourished and well developed . No acute distress.  ENT:. The ears and nose are normal in appearance.  Neck: The appearance of the neck is normal and no neck mass is present.  Pulmonary: No respiratory distress and normal respiratory rhythm and effort.  Cardiovascular: Heart rate and rhythm are normal . No peripheral edema.  Abdomen: The abdomen is soft and nontender. No masses are palpated. No CVA tenderness. No hernias are palpable. No hepatosplenomegaly noted.  Rectal: Rectal exam demonstrates normal sphincter tone, no tenderness and no masses. Estimated prostate size is 1+. The prostate has no nodularity and is not tender. The left seminal vesicle is nonpalpable. The right seminal vesicle is nonpalpable. The perineum is normal on inspection.  Genitourinary: Examination of the penis demonstrates no discharge, no masses, no lesions and a normal meatus. The scrotum is without lesions. The right epididymis is palpably normal and non-tender. The left epididymis is palpably normal and non-tender. The right testis is non-tender and without masses. The left testis is non-tender and without masses.  Lymphatics: The femoral and inguinal nodes are not enlarged or tender.  Skin: Normal skin turgor, no visible rash and no visible skin lesions.  Neuro/Psych:. Mood and affect are appropriate.    Results/Data Urine [Data Includes: Last 1 Day]   12Dec2013  COLOR YELLOW   APPEARANCE CLEAR   SPECIFIC GRAVITY 1.015   pH  6.0   GLUCOSE NEG mg/dL  BILIRUBIN NEG   KETONE NEG mg/dL  BLOOD TRACE   PROTEIN NEG mg/dL  UROBILINOGEN 0.2 mg/dL  NITRITE NEG   LEUKOCYTE ESTERASE NEG   SQUAMOUS EPITHELIAL/HPF NONE SEEN   WBC NONE SEEN WBC/hpf  RBC 0-2 RBC/hpf  BACTERIA NONE SEEN   CRYSTALS NONE SEEN   CASTS NONE SEEN    Assessment Assessed  1. 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 Benign Prostatic Hypertrophy With Urinary Obstruction 600.01  Plan Acinar Cell Carcinoma Of The Prostate Gland (185)  1. Diazepam 10 MG Oral Tablet; TAKE ONE TABLET ONE HOUR BEFORE PROCEDURE; Therapy:  12Dec2013 to (Evaluate:13Dec2013); Last Rx:12Dec2013 2. Sulfamethoxazole-TMP DS 800-160 MG Oral Tablet; TAKE 1 TABLET TWICE DAILY; Therapy:  12Dec2013 to (Evaluate:02Jan2014); Last Rx:12Dec2013 3. PROSTATE BIOPSY ULTRASOUND  Requested for: 12Dec2013 Health Maintenance (V70.0)  4. UA With REFLEX  Done: 12Dec2013 09:50AM  Discussion/Summary     In regards to his prostate cancer we discussed his PSA is low, PSAD is low and DRE is normal. We discussed the nature, R/B of continued surveillance or repeat TRUS bx to consider tx. We will repeat TRUS bx. Last time he had a lot of pain and high blood pressure. Therefore we will use lidocaine jelly and I gave him a Rx for valium. He will be covered with Bactrim.   He is stable on BPH surveillance.   cc: Dr. Bartholome Bill Electronically signed by : Jerilee Field, M.D.; Mar 07 2012 10:37AM  After further consideration I decided to perform the biopsy under anesthesia given the patient's prior issues and possible reaction to local Lidocaine block.

## 2012-03-29 ENCOUNTER — Encounter (HOSPITAL_BASED_OUTPATIENT_CLINIC_OR_DEPARTMENT_OTHER): Payer: Self-pay | Admitting: *Deleted

## 2012-03-29 ENCOUNTER — Encounter (HOSPITAL_BASED_OUTPATIENT_CLINIC_OR_DEPARTMENT_OTHER): Payer: Self-pay | Admitting: Anesthesiology

## 2012-03-29 ENCOUNTER — Ambulatory Visit (HOSPITAL_BASED_OUTPATIENT_CLINIC_OR_DEPARTMENT_OTHER)
Admission: RE | Admit: 2012-03-29 | Discharge: 2012-03-29 | Disposition: A | Discharge status: Home / Self Care | Payer: Medicare Other | Source: Ambulatory Visit | Attending: Urology | Admitting: Urology

## 2012-03-29 ENCOUNTER — Ambulatory Visit (HOSPITAL_BASED_OUTPATIENT_CLINIC_OR_DEPARTMENT_OTHER): Payer: Medicare Other | Admitting: Anesthesiology

## 2012-03-29 ENCOUNTER — Encounter (HOSPITAL_BASED_OUTPATIENT_CLINIC_OR_DEPARTMENT_OTHER)
Admission: RE | Disposition: A | Discharge status: Home / Self Care | Payer: Self-pay | Source: Ambulatory Visit | Attending: Urology

## 2012-03-29 DIAGNOSIS — I10 Essential (primary) hypertension: Secondary | ICD-10-CM | POA: Insufficient documentation

## 2012-03-29 DIAGNOSIS — N138 Other obstructive and reflux uropathy: Secondary | ICD-10-CM | POA: Insufficient documentation

## 2012-03-29 DIAGNOSIS — Z01812 Encounter for preprocedural laboratory examination: Secondary | ICD-10-CM | POA: Diagnosis not present

## 2012-03-29 DIAGNOSIS — R972 Elevated prostate specific antigen [PSA]: Secondary | ICD-10-CM | POA: Diagnosis not present

## 2012-03-29 DIAGNOSIS — N401 Enlarged prostate with lower urinary tract symptoms: Secondary | ICD-10-CM | POA: Diagnosis not present

## 2012-03-29 DIAGNOSIS — E119 Type 2 diabetes mellitus without complications: Secondary | ICD-10-CM | POA: Insufficient documentation

## 2012-03-29 DIAGNOSIS — N4289 Other specified disorders of prostate: Secondary | ICD-10-CM | POA: Diagnosis not present

## 2012-03-29 DIAGNOSIS — N423 Unspecified dysplasia of prostate: Secondary | ICD-10-CM | POA: Diagnosis not present

## 2012-03-29 HISTORY — DX: Unspecified osteoarthritis, unspecified site: M19.90

## 2012-03-29 HISTORY — PX: PROSTATE BIOPSY: SHX241

## 2012-03-29 LAB — POCT I-STAT, CHEM 8
Creatinine, Ser: 1.2 mg/dL (ref 0.50–1.35)
Glucose, Bld: 122 mg/dL — ABNORMAL HIGH (ref 70–99)
Hemoglobin: 15.3 g/dL (ref 13.0–17.0)
Potassium: 3.3 mEq/L — ABNORMAL LOW (ref 3.5–5.1)

## 2012-03-29 LAB — GLUCOSE, CAPILLARY

## 2012-03-29 SURGERY — BIOPSY, PROSTATE, RECTAL APPROACH, WITH US GUIDANCE
Anesthesia: General | Site: Rectum | Wound class: Contaminated

## 2012-03-29 MED ORDER — FENTANYL CITRATE 0.05 MG/ML IJ SOLN
INTRAMUSCULAR | Status: DC | PRN
  Administered 2012-03-29: 50 ug via INTRAVENOUS

## 2012-03-29 MED ORDER — LACTATED RINGERS IV SOLN
INTRAVENOUS | Status: DC
  Filled 2012-03-29: qty 1000

## 2012-03-29 MED ORDER — PROPOFOL 10 MG/ML IV BOLUS
INTRAVENOUS | Status: DC | PRN
  Administered 2012-03-29: 20 mg via INTRAVENOUS
  Administered 2012-03-29: 50 mg via INTRAVENOUS
  Administered 2012-03-29: 20 mg via INTRAVENOUS

## 2012-03-29 MED ORDER — MIDAZOLAM HCL 5 MG/5ML IJ SOLN
INTRAMUSCULAR | Status: DC | PRN
  Administered 2012-03-29: 1 mg via INTRAVENOUS

## 2012-03-29 MED ORDER — EPHEDRINE SULFATE 50 MG/ML IJ SOLN
INTRAMUSCULAR | Status: DC | PRN
  Administered 2012-03-29: 10 mg via INTRAVENOUS

## 2012-03-29 MED ORDER — PROMETHAZINE HCL 25 MG/ML IJ SOLN
6.2500 mg | INTRAMUSCULAR | Status: DC | PRN
  Filled 2012-03-29: qty 1

## 2012-03-29 MED ORDER — ONDANSETRON HCL 4 MG/2ML IJ SOLN
INTRAMUSCULAR | Status: DC | PRN
  Administered 2012-03-29: 4 mg via INTRAVENOUS

## 2012-03-29 MED ORDER — LIDOCAINE HCL (CARDIAC) 20 MG/ML IV SOLN
INTRAVENOUS | Status: DC | PRN
  Administered 2012-03-29: 50 mg via INTRAVENOUS

## 2012-03-29 MED ORDER — LACTATED RINGERS IV SOLN
INTRAVENOUS | Status: DC
  Administered 2012-03-29: 08:00:00 via INTRAVENOUS
  Filled 2012-03-29: qty 1000

## 2012-03-29 MED ORDER — ASPIRIN 81 MG PO TABS: 81.0000 mg | ORAL_TABLET | Freq: Every day | ORAL | Status: AC

## 2012-03-29 MED ORDER — NAPROXEN SODIUM 220 MG PO TABS: 220.0000 mg | ORAL_TABLET | Freq: Two times a day (BID) | ORAL | Status: DC

## 2012-03-29 MED ORDER — DEXTROSE 5 % IV SOLN
1.0000 g | Freq: Once | INTRAVENOUS | Status: AC
  Administered 2012-03-29: 2 g via INTRAVENOUS
  Filled 2012-03-29: qty 10

## 2012-03-29 SURGICAL SUPPLY — 5 items
DRESSING TELFA 8X3 (GAUZE/BANDAGES/DRESSINGS) ×1 IMPLANT
GLOVE SURG SS PI 8.0 STRL IVOR (GLOVE) IMPLANT
SURGILUBE 2OZ TUBE FLIPTOP (MISCELLANEOUS) ×2 IMPLANT
TOWEL OR 17X24 6PK STRL BLUE (TOWEL DISPOSABLE) ×1 IMPLANT
UNDERPAD 30X30 INCONTINENT (UNDERPADS AND DIAPERS) ×2 IMPLANT

## 2012-03-29 NOTE — Interval H&P Note (Signed)
History and Physical Interval Note:  03/29/2012 8:44 AM  [name]  has presented today for surgery, with the diagnosis of Elevated Prostatic Specific Antigen  The various methods of treatment have been discussed with the patient and family. After consideration of risks, benefits and other options for treatment, the patient has consented to  Procedure(s) (LRB) with comments: BIOPSY TRANSRECTAL ULTRASONIC PROSTATE (TUBP) (N/A) as a surgical intervention .  The patient's history has been reviewed, patient examined, no change in status, stable for surgery.  I have reviewed the patient's chart and labs.  Questions were answered to the patient's satisfaction.     Antony Haste

## 2012-03-29 NOTE — Op Note (Signed)
Preoperative diagnosis: Prostate cancer Postoperative diagnosis: Prostate cancer  Procedure: Exam under anesthesia Transrectal ultrasound of the prostate Prostate needle biopsy with transrectal ultrasound guidance  Surgeon: Mena Goes  Anesthesia: MAC  Findings: On exam under anesthesia the prostate was palpably normal without heart area or nodule.  Transrectal ultrasound of the prostate: The prostate appeared normal without hypoechoic area or evidence of tumor/extraprostatic extension. The seminal vesicles appeared normal. There were some fine calcifications along the prostatic urethra with posterior shadowing. Prostate measurements: length 4.44 cm, height 324 cm, with 5.43 cm Prostate volume: 38 mL  Description of procedure: After consent was obtained patient brought the operating room. After induction of anesthesia he is placed in left lateral decubitus with the right side up. The legs were flexed. A timeout was performed to confirm the patient and procedure. An exam under anesthesia was performed. The ultrasound probe was passed per rectum and an ultrasound of the prostate was performed. Using ultrasound guidance a standard 12 core biopsy was obtained from the left and right base, mid, apex x2 each. Each quadrant lateral to medial. There were no complications and minimal bleeding. The patient tolerated the procedure well.  Specimens: Prostate biopsy x12 to office lab individually labeled.  Complications: None  Blood loss: Minimal  Drains: None  Disposition: Patient stable to PACU

## 2012-03-29 NOTE — Transfer of Care (Signed)
Immediate Anesthesia Transfer of Care Note  Patient: [name]  Procedure(s) Performed: Procedure(s) (LRB) with comments: BIOPSY TRANSRECTAL ULTRASONIC PROSTATE (TUBP) (N/A)  Patient Location: PACU  Anesthesia Type:MAC  Level of Consciousness: sedated and responds to stimulation  Airway & Oxygen Therapy: Patient Spontanous Breathing and Patient connected to face mask oxygen  Post-op Assessment: Report given to PACU RN  Post vital signs: Reviewed and stable  Complications: No apparent anesthesia complications

## 2012-03-29 NOTE — Anesthesia Preprocedure Evaluation (Addendum)
Anesthesia Evaluation  Patient identified by MRN, date of birth, ID band Patient awake    Reviewed: Allergy & Precautions, H&P , NPO status , Patient's Chart, lab work & pertinent test results  Airway Mallampati: II TM Distance: >3 FB Neck ROM: Full    Dental  (+) Teeth Intact, Caps and Dental Advisory Given   Pulmonary neg pulmonary ROS,  breath sounds clear to auscultation  Pulmonary exam normal       Cardiovascular hypertension, Pt. on medications negative cardio ROS  Rhythm:Regular Rate:Normal     Neuro/Psych negative neurological ROS  negative psych ROS   GI/Hepatic Neg liver ROS, GERD-  Medicated,  Endo/Other  diabetesDiet control  Renal/GU negative Renal ROS  negative genitourinary   Musculoskeletal negative musculoskeletal ROS (+)   Abdominal   Peds  Hematology negative hematology ROS (+)   Anesthesia Other Findings   Reproductive/Obstetrics negative OB ROS                           Anesthesia Physical Anesthesia Plan  ASA: II  Anesthesia Plan: MAC   Post-op Pain Management:    Induction: Intravenous  Airway Management Planned: LMA  Additional Equipment:   Intra-op Plan:   Post-operative Plan: Extubation in OR  Informed Consent: I have reviewed the patients History and Physical, chart, labs and discussed the procedure including the risks, benefits and alternatives for the proposed anesthesia with the patient or authorized representative who has indicated his/her understanding and acceptance.   Dental advisory given  Plan Discussed with: CRNA  Anesthesia Plan Comments:        Anesthesia Quick Evaluation

## 2012-03-29 NOTE — Anesthesia Postprocedure Evaluation (Signed)
Anesthesia Post Note  Patient: [name]  Procedure(s) Performed: Procedure(s) (LRB): BIOPSY TRANSRECTAL ULTRASONIC PROSTATE (TUBP) (N/A)  Anesthesia type: MAC  Patient location: PACU  Post pain: Pain level controlled  Post assessment: Post-op Vital signs reviewed  Last Vitals:  Filed Vitals:   03/29/12 0930  BP: 117/69  Pulse: 82  Temp:   Resp: 15    Post vital signs: Reviewed  Level of consciousness: sedated  Complications: No apparent anesthesia complications

## 2012-04-01 ENCOUNTER — Encounter (HOSPITAL_BASED_OUTPATIENT_CLINIC_OR_DEPARTMENT_OTHER): Payer: Self-pay | Admitting: Urology

## 2012-04-12 ENCOUNTER — Encounter: Payer: Self-pay | Admitting: Gastroenterology

## 2012-04-17 ENCOUNTER — Encounter: Payer: Self-pay | Admitting: Gastroenterology

## 2012-05-14 ENCOUNTER — Ambulatory Visit (AMBULATORY_SURGERY_CENTER): Payer: Medicare Other

## 2012-05-14 VITALS — Ht 71.0 in | Wt 135.0 lb

## 2012-05-14 DIAGNOSIS — Z860100 Personal history of colon polyps, unspecified: Secondary | ICD-10-CM

## 2012-05-14 MED ORDER — MOVIPREP 100 G PO SOLR: 1.0000 | Freq: Once | ORAL | Status: DC

## 2012-05-25 HISTORY — PX: COLONOSCOPY: SHX174

## 2012-05-28 ENCOUNTER — Ambulatory Visit (AMBULATORY_SURGERY_CENTER): Payer: Medicare Other | Admitting: Gastroenterology

## 2012-05-28 ENCOUNTER — Encounter: Payer: Self-pay | Admitting: Gastroenterology

## 2012-05-28 VITALS — BP 137/72 | HR 65 | Temp 96.6°F | Resp 18 | Ht 71.0 in | Wt 135.0 lb

## 2012-05-28 MED ORDER — SODIUM CHLORIDE 0.9 % IV SOLN: 500.0000 mL | INTRAVENOUS | Status: DC

## 2012-05-28 NOTE — Patient Instructions (Signed)
YOU HAD AN ENDOSCOPIC PROCEDURE TODAY AT THE [facility]: Refer to the procedure report that was given to you for any specific questions about what was found during the examination.  If the procedure report does not answer your questions, please call your gastroenterologist to clarify.  If you requested that your care partner not be given the details of your procedure findings, then the procedure report has been included in a sealed envelope for you to review at your convenience later.  YOU SHOULD EXPECT: Some feelings of bloating in the abdomen. Passage of more gas than usual.  Walking can help get rid of the air that was put into your GI tract during the procedure and reduce the bloating. If you had a lower endoscopy (such as a colonoscopy or flexible sigmoidoscopy) you may notice spotting of blood in your stool or on the toilet paper. If you underwent a bowel prep for your procedure, then you may not have a normal bowel movement for a few days.  DIET: Your first meal following the procedure should be a light meal and then it is ok to progress to your normal diet.  A half-sandwich or bowl of soup is an example of a good first meal.  Heavy or fried foods are harder to digest and may make you feel nauseous or bloated.  Likewise meals heavy in dairy and vegetables can cause extra gas to form and this can also increase the bloating.  Drink plenty of fluids but you should avoid alcoholic beverages for 24 hours.  ACTIVITY: Your care partner should take you home directly after the procedure.  You should plan to take it easy, moving slowly for the rest of the day.  You can resume normal activity the day after the procedure however you should NOT DRIVE or use heavy machinery for 24 hours (because of the sedation medicines used during the test).    SYMPTOMS TO REPORT IMMEDIATELY: A gastroenterologist can be reached at any hour.  During normal business hours, 8:30 AM to 5:00 PM Monday through Friday,  call [phone].  After hours and on weekends, please call the GI answering service at [phone] who will take a message and have the physician on call contact you.   Following lower endoscopy (colonoscopy or flexible sigmoidoscopy):  Excessive amounts of blood in the stool  Significant tenderness or worsening of abdominal pains  Swelling of the abdomen that is new, acute  Fever of 100F or higher    FOLLOW UP: If any biopsies were taken you will be contacted by phone or by letter within the next 1-3 weeks.  Call your gastroenterologist if you have not heard about the biopsies in 3 weeks.  Our staff will call the home number listed on your records the next business day following your procedure to check on you and address any questions or concerns that you may have at that time regarding the information given to you following your procedure. This is a courtesy call and so if there is no answer at the home number and we have not heard from you through the emergency physician on call, we will assume that you have returned to your regular daily activities without incident.  SIGNATURES/CONFIDENTIALITY: You and/or your care partner have signed paperwork which will be entered into your electronic medical record.  These signatures attest to the fact that that the information above on your After Visit Summary has been reviewed and is understood.  Full responsibility of the confidentiality   of this discharge information lies with you and/or your care-partner.   Information on diverticulosis & hemorrhoids given to you today 

## 2012-05-28 NOTE — Op Note (Signed)
[facility] [address] [place], [address]   COLONOSCOPY PROCEDURE REPORT  PATIENT: [name], [name]  MR#: [id number] BIRTHDATE: Nov 01, 1935 , 76  yrs. old GENDER: Male ENDOSCOPIST: Meryl Dare, MD, [facility] PROCEDURE DATE:  05/28/2012 PROCEDURE:   Colonoscopy, screening ASA CLASS:   Class II INDICATIONS:Patient's personal history of adenomatous colon polyps: 2005. MEDICATIONS: MAC sedation, administered by CRNA and propofol (Diprivan) 150mg  IV DESCRIPTION OF PROCEDURE:   After the risks benefits and alternatives of the procedure were thoroughly explained, informed consent was obtained.  A digital rectal exam revealed no abnormalities of the rectum.   The LB CF-[id number]  endoscope was introduced through the anus and advanced to the cecum, which was identified by both the appendix and ileocecal valve. No adverse events experienced.   The quality of the prep was good, using MoviPrep  The instrument was then slowly withdrawn as the colon was fully examined.  COLON FINDINGS: Mild diverticulosis was noted in the ascending colon, transverse colon, and descending colon.  There was evidence of a prior colorectal surgical anastomosis from a sigmoid colectomy. The colon was otherwise normal. There was no diverticulosis, inflammation, polyps or cancers unless previously stated. Retroflexed views revealed small internal hemorrhoids. The time to cecum=1 minutes 45 seconds.  Withdrawal time=8 minutes 41 seconds.  The scope was withdrawn and the procedure completed.  COMPLICATIONS: There were no complications.  ENDOSCOPIC IMPRESSION: 1.   Mild diverticulosis was noted in the ascending colon, transverse colon, and descending colon 2.   Prior colorectal surgical anastomosis 3.   Small internal hemorrhoids  RECOMMENDATIONS: 1.  High fiber diet with liberal fluid intake. 2.  Repeat Colonoscopy in 5 years.  eSigned:  Meryl Dare, MD, [facility] 05/28/2012 11:53  AM

## 2012-05-28 NOTE — Progress Notes (Signed)
Patient did not experience any of the following events: a burn prior to discharge; a fall within the facility; wrong site/side/patient/procedure/implant event; or a hospital transfer or hospital admission upon discharge from the facility. (G8907) Patient did not have preoperative order for IV antibiotic SSI prophylaxis. (G8918)  

## 2012-05-29 ENCOUNTER — Telehealth: Payer: Self-pay | Admitting: *Deleted

## 2012-05-29 NOTE — Telephone Encounter (Signed)
  Follow up Call-  Call back number 05/28/2012  Post procedure Call Back phone  # [phone]  Permission to leave phone message Yes     Patient questions:  Do you have a fever, pain , or abdominal swelling? no Pain Score  0 *  Have you tolerated food without any problems? yes  Have you been able to return to your normal activities? yes  Do you have any questions about your discharge instructions: Diet   no Medications  no Follow up visit  no  Do you have questions or concerns about your Care? no  Actions: * If pain score is 4 or above: No action needed, pain <4.

## 2012-07-11 DIAGNOSIS — H40019 Open angle with borderline findings, low risk, unspecified eye: Secondary | ICD-10-CM | POA: Diagnosis not present

## 2012-08-27 ENCOUNTER — Encounter: Payer: Self-pay | Admitting: Internal Medicine

## 2012-08-27 MED ORDER — AMLODIPINE BESYLATE 10 MG PO TABS: ORAL_TABLET | ORAL | Status: DC

## 2012-10-07 DIAGNOSIS — C61 Malignant neoplasm of prostate: Secondary | ICD-10-CM | POA: Diagnosis not present

## 2012-10-14 DIAGNOSIS — N401 Enlarged prostate with lower urinary tract symptoms: Secondary | ICD-10-CM | POA: Diagnosis not present

## 2012-10-14 DIAGNOSIS — C61 Malignant neoplasm of prostate: Secondary | ICD-10-CM | POA: Diagnosis not present

## 2012-10-17 ENCOUNTER — Ambulatory Visit (INDEPENDENT_AMBULATORY_CARE_PROVIDER_SITE_OTHER): Payer: Medicare Other | Admitting: Internal Medicine

## 2012-10-17 ENCOUNTER — Encounter: Payer: Self-pay | Admitting: Internal Medicine

## 2012-10-17 VITALS — BP 150/78 | HR 77 | Temp 98.2°F | Resp 12 | Ht 70.0 in | Wt 134.4 lb

## 2012-10-17 DIAGNOSIS — Z0000 Encounter for general adult medical examination without abnormal findings: Secondary | ICD-10-CM | POA: Diagnosis not present

## 2012-10-17 DIAGNOSIS — E785 Hyperlipidemia, unspecified: Secondary | ICD-10-CM | POA: Diagnosis not present

## 2012-10-17 DIAGNOSIS — I10 Essential (primary) hypertension: Secondary | ICD-10-CM | POA: Diagnosis not present

## 2012-10-17 DIAGNOSIS — R7309 Other abnormal glucose: Secondary | ICD-10-CM | POA: Diagnosis not present

## 2012-10-17 DIAGNOSIS — R7303 Prediabetes: Secondary | ICD-10-CM

## 2012-10-17 MED ORDER — AMLODIPINE BESYLATE 10 MG PO TABS: ORAL_TABLET | ORAL | Status: DC

## 2012-10-17 NOTE — Progress Notes (Signed)
Subjective:    Patient ID: [name], male    DOB: [birth year], 77 y.o.   MRN: [id number]  HPI Medicare Wellness Visit:  Psychosocial & medical history were reviewed as required by Medicare (abuse,antisocial behavioral risks,firearm risk).  Social history: caffeine: 2 cups/ day , alcohol:  no ,  tobacco use: quit 1991   Exercise : see below  Home & personal  safety / fall risk:no Limitations of activities of daily living:no Seatbelt  and smoke alarm use:yes Power of Attorney/Living Will status : needed Ophthalmology exam status :current Hearing evaluation status:not current Orientation :oriented X 3 Memory & recall :good Math testing:good Active depression / anxiety:denied Foreign travel history : 2014 [place] Immunization status for Shingles /Flu/ PNA/ tetanus :current Transfusion history: no  Preventive health surveillance status of colonoscopy as per protocol/ SOC: current Dental care:  Every 6 mos Chart reviewed &  Updated. Active issues reviewed & addressed.      Review of Systems He is on a heart healthy diet; he exercises as bike 30 minutes 4-5 times per week without symptoms. Specifically he denies chest pain, palpitations, dyspnea, or claudication. Family history is negative for premature coronary disease.  FBS averages 92; highest A1c has been 6.4%. BP @ home not monitored.     Objective:   Physical Exam  Gen.: Thin but healthy and well-nourished in appearance. Alert, appropriate and cooperative throughout exam.Appears younger than stated age  Head: Normocephalic without obvious abnormalities; pattern alopecia . Goatee Eyes: No corneal or conjunctival inflammation noted.  Extraocular motion intact. Vision grossly normal with lenses Ears: External  ear exam reveals no significant lesions or deformities. Canals clear .TMs normal. Hearing is grossly decreased on L Nose: External nasal exam reveals no deformity or inflammation. Nasal mucosa are pink and moist. No  lesions or exudates noted. Septum  To R  Mouth: Oral mucosa and oropharynx reveal no lesions or exudates. Teeth in good repair. Neck: No deformities, masses, or tenderness noted. Range of motion decreased . Thyroid normal. Lungs: Normal respiratory effort; chest expands symmetrically. Lungs are clear to auscultation without rales, wheezes, or increased work of breathing. Heart: Normal rate and rhythm. Normal S1 and S2. No gallop or rub. Apical click vs loud S4; no MR murmur. Abdomen: Bowel sounds normal; abdomen soft and nontender. No masses, organomegaly or hernias noted. Genitalia:As per Dr Mena Goes                                  Musculoskeletal/extremities: No deformity or scoliosis noted of  the thoracic or lumbar spine.  No clubbing, cyanosis, edema, or significant extremity  deformity noted. Range of motion normal .Tone & strength  Normal. Joints normal . Nail health good. Able to lie down & sit up w/o help. Negative SLR bilaterally Vascular: Carotid, radial artery, dorsalis pedis and  posterior tibial pulses are full and equal. No bruits present. Neurologic: Alert and oriented x3. Deep tendon reflexes symmetrical and normal.     Skin: Intact without suspicious lesions or rashes. Lymph: No cervical, axillary lymphadenopathy present. Psych: Mood and affect are normal. Normally interactive  Assessment & Plan:  #1 Medicare Wellness Exam; criteria met ; data entered #2 Problem List/Diagnoses reviewed Plan:  Assessments made/ Orders entered  

## 2012-10-17 NOTE — Patient Instructions (Addendum)
Fasting Labs @ [facility] : BMET,Lipids, hepatic panel,  TSH. Minimal Blood Pressure Goal= AVERAGE < 140/90;  Ideal is an AVERAGE < 135/85. This AVERAGE should be calculated from @ least 5-7 BP readings taken @ different times of day on different days of week. You should not respond to isolated BP readings , but rather the AVERAGE for that week .Please bring your  blood pressure cuff to office visits to verify that it is reliable.It  can also be checked against the blood pressure device at the pharmacy. Finger or wrist cuffs are not dependable; an arm cuff is.

## 2012-10-18 ENCOUNTER — Other Ambulatory Visit (INDEPENDENT_AMBULATORY_CARE_PROVIDER_SITE_OTHER): Payer: Medicare Other

## 2012-10-18 DIAGNOSIS — R7309 Other abnormal glucose: Secondary | ICD-10-CM

## 2012-10-18 DIAGNOSIS — I10 Essential (primary) hypertension: Secondary | ICD-10-CM

## 2012-10-18 DIAGNOSIS — E785 Hyperlipidemia, unspecified: Secondary | ICD-10-CM | POA: Diagnosis not present

## 2012-10-18 DIAGNOSIS — R7303 Prediabetes: Secondary | ICD-10-CM

## 2012-10-18 LAB — HEPATIC FUNCTION PANEL
ALT: 25 U/L (ref 0–53)
AST: 31 U/L (ref 0–37)
Albumin: 4.2 g/dL (ref 3.5–5.2)
Total Protein: 7 g/dL (ref 6.0–8.3)

## 2012-10-18 LAB — LIPID PANEL
Cholesterol: 158 mg/dL (ref 0–200)
Triglycerides: 83 mg/dL (ref 0.0–149.0)

## 2012-10-18 LAB — BASIC METABOLIC PANEL
BUN: 16 mg/dL (ref 6–23)
CO2: 28 mEq/L (ref 19–32)
Calcium: 9.3 mg/dL (ref 8.4–10.5)
Chloride: 104 mEq/L (ref 96–112)
Creatinine, Ser: 1 mg/dL (ref 0.4–1.5)
Glucose, Bld: 109 mg/dL — ABNORMAL HIGH (ref 70–99)

## 2012-10-24 ENCOUNTER — Encounter: Payer: Self-pay | Admitting: Internal Medicine

## 2012-10-24 DIAGNOSIS — E785 Hyperlipidemia, unspecified: Secondary | ICD-10-CM

## 2012-10-24 DIAGNOSIS — E119 Type 2 diabetes mellitus without complications: Secondary | ICD-10-CM

## 2012-10-24 DIAGNOSIS — Z0000 Encounter for general adult medical examination without abnormal findings: Secondary | ICD-10-CM

## 2012-10-24 DIAGNOSIS — I10 Essential (primary) hypertension: Secondary | ICD-10-CM

## 2012-10-25 MED ORDER — PRAVASTATIN SODIUM 20 MG PO TABS: ORAL_TABLET | ORAL | Status: DC

## 2012-10-25 MED ORDER — HYDROCHLOROTHIAZIDE 12.5 MG PO CAPS: 12.5000 mg | ORAL_CAPSULE | Freq: Every day | ORAL | Status: DC

## 2012-10-25 NOTE — Telephone Encounter (Signed)
Spoke with patient, patient confirmed he would like rx sent to [facility]

## 2012-10-30 DIAGNOSIS — H40009 Preglaucoma, unspecified, unspecified eye: Secondary | ICD-10-CM | POA: Diagnosis not present

## 2013-01-23 ENCOUNTER — Ambulatory Visit (INDEPENDENT_AMBULATORY_CARE_PROVIDER_SITE_OTHER): Payer: Medicare Other

## 2013-01-23 DIAGNOSIS — Z23 Encounter for immunization: Secondary | ICD-10-CM | POA: Diagnosis not present

## 2013-04-09 DIAGNOSIS — N138 Other obstructive and reflux uropathy: Secondary | ICD-10-CM | POA: Diagnosis not present

## 2013-04-09 DIAGNOSIS — N139 Obstructive and reflux uropathy, unspecified: Secondary | ICD-10-CM | POA: Diagnosis not present

## 2013-04-09 DIAGNOSIS — N401 Enlarged prostate with lower urinary tract symptoms: Secondary | ICD-10-CM | POA: Diagnosis not present

## 2013-04-16 DIAGNOSIS — C61 Malignant neoplasm of prostate: Secondary | ICD-10-CM | POA: Diagnosis not present

## 2013-04-16 DIAGNOSIS — N401 Enlarged prostate with lower urinary tract symptoms: Secondary | ICD-10-CM | POA: Diagnosis not present

## 2013-07-07 ENCOUNTER — Ambulatory Visit (INDEPENDENT_AMBULATORY_CARE_PROVIDER_SITE_OTHER): Payer: Medicare Other | Admitting: Family Medicine

## 2013-07-07 ENCOUNTER — Encounter: Payer: Self-pay | Admitting: Family Medicine

## 2013-07-07 VITALS — BP 148/80 | HR 80 | Temp 98.0°F | Wt 134.0 lb

## 2013-07-07 DIAGNOSIS — H6120 Impacted cerumen, unspecified ear: Secondary | ICD-10-CM | POA: Diagnosis not present

## 2013-07-07 DIAGNOSIS — I10 Essential (primary) hypertension: Secondary | ICD-10-CM

## 2013-07-07 MED ORDER — DOXAZOSIN MESYLATE 1 MG PO TABS: 1.0000 mg | ORAL_TABLET | Freq: Every day | ORAL | Status: DC

## 2013-07-07 NOTE — Progress Notes (Signed)
Pre visit review using our clinic review tool, if applicable. No additional management support is needed unless otherwise documented below in the visit note. 

## 2013-07-07 NOTE — Progress Notes (Signed)
Patient ID: [name], male   DOB: Aug 29, 1935, 78 y.o.   MRN: [id number]   Subjective:    Patient ID: [name], male    DOB: [birth year], 78 y.o.   MRN: [id number] HPI Pt here c/o R ear feeling blocked.  No other complaints.           Objective:    BP 148/80  Pulse 80  Temp(Src) 98 F (36.7 C) (Oral)  Wt 134 lb (60.782 kg)  SpO2 97% General appearance: alert, cooperative, appears stated age and no distress Ears: R ear impacted-- unable to remove with hoop---irrigated successfully- Lungs: clear to auscultation bilaterally Heart: S1, S2 normal Extremities: extremities normal, atraumatic, no cyanosis or edema       Assessment & Plan:  1. Cerumen impaction rto prn   2. HTN (hypertension) Elevated today-- add cardura, con't norvasc---rto 3 months or sooner prn - doxazosin (CARDURA) 1 MG tablet; Take 1 tablet (1 mg total) by mouth daily.  Dispense: 30 tablet; Refill: 2

## 2013-07-07 NOTE — Patient Instructions (Signed)
Can use debrox or cerumenex -- for ear wax  rto for physical

## 2013-07-21 DIAGNOSIS — H2510 Age-related nuclear cataract, unspecified eye: Secondary | ICD-10-CM | POA: Diagnosis not present

## 2013-07-21 DIAGNOSIS — H40019 Open angle with borderline findings, low risk, unspecified eye: Secondary | ICD-10-CM | POA: Diagnosis not present

## 2013-09-25 DIAGNOSIS — H40019 Open angle with borderline findings, low risk, unspecified eye: Secondary | ICD-10-CM | POA: Diagnosis not present

## 2013-09-26 ENCOUNTER — Other Ambulatory Visit: Payer: Self-pay | Admitting: Family Medicine

## 2013-10-08 DIAGNOSIS — C61 Malignant neoplasm of prostate: Secondary | ICD-10-CM | POA: Diagnosis not present

## 2013-10-15 DIAGNOSIS — C61 Malignant neoplasm of prostate: Secondary | ICD-10-CM | POA: Diagnosis not present

## 2013-10-15 DIAGNOSIS — N401 Enlarged prostate with lower urinary tract symptoms: Secondary | ICD-10-CM | POA: Diagnosis not present

## 2013-10-20 ENCOUNTER — Encounter: Payer: Self-pay | Admitting: Family Medicine

## 2013-10-20 ENCOUNTER — Ambulatory Visit (INDEPENDENT_AMBULATORY_CARE_PROVIDER_SITE_OTHER): Payer: Medicare Other | Admitting: Family Medicine

## 2013-10-20 VITALS — BP 146/70 | HR 90 | Temp 98.3°F | Ht 70.0 in | Wt 136.0 lb

## 2013-10-20 DIAGNOSIS — I10 Essential (primary) hypertension: Secondary | ICD-10-CM

## 2013-10-20 DIAGNOSIS — Z0000 Encounter for general adult medical examination without abnormal findings: Secondary | ICD-10-CM | POA: Diagnosis not present

## 2013-10-20 DIAGNOSIS — E119 Type 2 diabetes mellitus without complications: Secondary | ICD-10-CM

## 2013-10-20 DIAGNOSIS — Z23 Encounter for immunization: Secondary | ICD-10-CM

## 2013-10-20 DIAGNOSIS — Z8546 Personal history of malignant neoplasm of prostate: Secondary | ICD-10-CM | POA: Diagnosis not present

## 2013-10-20 DIAGNOSIS — E1059 Type 1 diabetes mellitus with other circulatory complications: Secondary | ICD-10-CM

## 2013-10-20 DIAGNOSIS — E785 Hyperlipidemia, unspecified: Secondary | ICD-10-CM

## 2013-10-20 MED ORDER — HYDROCHLOROTHIAZIDE 12.5 MG PO CAPS: 12.5000 mg | ORAL_CAPSULE | Freq: Every day | ORAL | Status: DC

## 2013-10-20 MED ORDER — PRAVASTATIN SODIUM 20 MG PO TABS: ORAL_TABLET | ORAL | Status: DC

## 2013-10-20 MED ORDER — DOXAZOSIN MESYLATE 1 MG PO TABS: ORAL_TABLET | ORAL | Status: DC

## 2013-10-20 MED ORDER — AMLODIPINE BESYLATE 10 MG PO TABS: ORAL_TABLET | ORAL | Status: DC

## 2013-10-20 NOTE — Progress Notes (Signed)
Pre visit review using our clinic review tool, if applicable. No additional management support is needed unless otherwise documented below in the visit note. 

## 2013-10-20 NOTE — Patient Instructions (Signed)
Preventive Care for Adults A healthy lifestyle and preventive care can promote health and wellness. Preventive health guidelines for men include the following key practices:  A routine yearly physical is a good way to check with your health care provider about your health and preventative screening. It is a chance to share any concerns and updates on your health and to receive a thorough exam.  Visit your dentist for a routine exam and preventative care every 6 months. Brush your teeth twice a day and floss once a day. Good oral hygiene prevents tooth decay and gum disease.  The frequency of eye exams is based on your age, health, family medical history, use of contact lenses, and other factors. Follow your health care provider's recommendations for frequency of eye exams.  Eat a healthy diet. Foods such as vegetables, fruits, whole grains, low-fat dairy products, and lean protein foods contain the nutrients you need without too many calories. Decrease your intake of foods high in solid fats, added sugars, and salt. Eat the right amount of calories for you.Get information about a proper diet from your health care provider, if necessary.  Regular physical exercise is one of the most important things you can do for your health. Most adults should get at least 150 minutes of moderate-intensity exercise (any activity that increases your heart rate and causes you to sweat) each week. In addition, most adults need muscle-strengthening exercises on 2 or more days a week.  Maintain a healthy weight. The body mass index (BMI) is a screening tool to identify possible weight problems. It provides an estimate of body fat based on height and weight. Your health care provider can find your BMI and can help you achieve or maintain a healthy weight.For adults 20 years and older:  A BMI below 18.5 is considered underweight.  A BMI of 18.5 to 24.9 is normal.  A BMI of 25 to 29.9 is considered overweight.  A BMI  of 30 and above is considered obese.  Maintain normal blood lipids and cholesterol levels by exercising and minimizing your intake of saturated fat. Eat a balanced diet with plenty of fruit and vegetables. Blood tests for lipids and cholesterol should begin at age 50 and be repeated every 5 years. If your lipid or cholesterol levels are high, you are over 50, or you are at high risk for heart disease, you may need your cholesterol levels checked more frequently.Ongoing high lipid and cholesterol levels should be treated with medicines if diet and exercise are not working.  If you smoke, find out from your health care provider how to quit. If you do not use tobacco, do not start.  Lung cancer screening is recommended for adults aged 73-80 years who are at high risk for developing lung cancer because of a history of smoking. A yearly low-dose CT scan of the lungs is recommended for people who have at least a 30-pack-year history of smoking and are a current smoker or have quit within the past 15 years. A pack year of smoking is smoking an average of 1 pack of cigarettes a day for 1 year (for example: 1 pack a day for 30 years or 2 packs a day for 15 years). Yearly screening should continue until the smoker has stopped smoking for at least 15 years. Yearly screening should be stopped for people who develop a health problem that would prevent them from having lung cancer treatment.  If you choose to drink alcohol, do not have more than  2 drinks per day. One drink is considered to be 12 ounces (355 mL) of beer, 5 ounces (148 mL) of wine, or 1.5 ounces (44 mL) of liquor.  Avoid use of street drugs. Do not share needles with anyone. Ask for help if you need support or instructions about stopping the use of drugs.  High blood pressure causes heart disease and increases the risk of stroke. Your blood pressure should be checked at least every 1-2 years. Ongoing high blood pressure should be treated with  medicines, if weight loss and exercise are not effective.  If you are 45-79 years old, ask your health care provider if you should take aspirin to prevent heart disease.  Diabetes screening involves taking a blood sample to check your fasting blood sugar level. This should be done once every 3 years, after age 45, if you are within normal weight and without risk factors for diabetes. Testing should be considered at a younger age or be carried out more frequently if you are overweight and have at least 1 risk factor for diabetes.  Colorectal cancer can be detected and often prevented. Most routine colorectal cancer screening begins at the age of 50 and continues through age 75. However, your health care provider may recommend screening at an earlier age if you have risk factors for colon cancer. On a yearly basis, your health care provider may provide home test kits to check for hidden blood in the stool. Use of a small camera at the end of a tube to directly examine the colon (sigmoidoscopy or colonoscopy) can detect the earliest forms of colorectal cancer. Talk to your health care provider about this at age 50, when routine screening begins. Direct exam of the colon should be repeated every 5-10 years through age 75, unless early forms of precancerous polyps or small growths are found.  People who are at an increased risk for hepatitis B should be screened for this virus. You are considered at high risk for hepatitis B if:  You were born in a country where hepatitis B occurs often. Talk with your health care provider about which countries are considered high risk.  Your parents were born in a high-risk country and you have not received a shot to protect against hepatitis B (hepatitis B vaccine).  You have HIV or AIDS.  You use needles to inject street drugs.  You live with, or have sex with, someone who has hepatitis B.  You are a man who has sex with other men (MSM).  You get hemodialysis  treatment.  You take certain medicines for conditions such as cancer, organ transplantation, and autoimmune conditions.  Hepatitis C blood testing is recommended for all people born from 1945 through 1965 and any individual with known risks for hepatitis C.  Practice safe sex. Use condoms and avoid high-risk sexual practices to reduce the spread of sexually transmitted infections (STIs). STIs include gonorrhea, chlamydia, syphilis, trichomonas, herpes, HPV, and human immunodeficiency virus (HIV). Herpes, HIV, and HPV are viral illnesses that have no cure. They can result in disability, cancer, and death.  If you are at risk of being infected with HIV, it is recommended that you take a prescription medicine daily to prevent HIV infection. This is called preexposure prophylaxis (PrEP). You are considered at risk if:  You are a man who has sex with other men (MSM) and have other risk factors.  You are a heterosexual man, are sexually active, and are at increased risk for HIV infection.    You take drugs by injection.  You are sexually active with a partner who has HIV.  Talk with your health care provider about whether you are at high risk of being infected with HIV. If you choose to begin PrEP, you should first be tested for HIV. You should then be tested every 3 months for as long as you are taking PrEP.  A one-time screening for abdominal aortic aneurysm (AAA) and surgical repair of large AAAs by ultrasound are recommended for men ages 32 to 67 years who are current or former smokers.  Healthy men should no longer receive prostate-specific antigen (PSA) blood tests as part of routine cancer screening. Talk with your health care provider about prostate cancer screening.  Testicular cancer screening is not recommended for adult males who have no symptoms. Screening includes self-exam, a health care provider exam, and other screening tests. Consult with your health care provider about any symptoms  you have or any concerns you have about testicular cancer.  Use sunscreen. Apply sunscreen liberally and repeatedly throughout the day. You should seek shade when your shadow is shorter than you. Protect yourself by wearing long sleeves, pants, a wide-brimmed hat, and sunglasses year round, whenever you are outdoors.  Once a month, do a whole-body skin exam, using a mirror to look at the skin on your back. Tell your health care provider about new moles, moles that have irregular borders, moles that are larger than a pencil eraser, or moles that have changed in shape or color.  Stay current with required vaccines (immunizations).  Influenza vaccine. All adults should be immunized every year.  Tetanus, diphtheria, and acellular pertussis (Td, Tdap) vaccine. An adult who has not previously received Tdap or who does not know his vaccine status should receive 1 dose of Tdap. This initial dose should be followed by tetanus and diphtheria toxoids (Td) booster doses every 10 years. Adults with an unknown or incomplete history of completing a 3-dose immunization series with Td-containing vaccines should begin or complete a primary immunization series including a Tdap dose. Adults should receive a Td booster every 10 years.  Varicella vaccine. An adult without evidence of immunity to varicella should receive 2 doses or a second dose if he has previously received 1 dose.  Human papillomavirus (HPV) vaccine. Males aged 68-21 years who have not received the vaccine previously should receive the 3-dose series. Males aged 22-26 years may be immunized. Immunization is recommended through the age of 6 years for any male who has sex with males and did not get any or all doses earlier. Immunization is recommended for any person with an immunocompromised condition through the age of 49 years if he did not get any or all doses earlier. During the 3-dose series, the second dose should be obtained 4-8 weeks after the first  dose. The third dose should be obtained 24 weeks after the first dose and 16 weeks after the second dose.  Zoster vaccine. One dose is recommended for adults aged 50 years or older unless certain conditions are present.  Measles, mumps, and rubella (MMR) vaccine. Adults born before [birth year] generally are considered immune to measles and mumps. Adults born in [birth year] or later should have 1 or more doses of MMR vaccine unless there is a contraindication to the vaccine or there is laboratory evidence of immunity to each of the three diseases. A routine second dose of MMR vaccine should be obtained at least 28 days after the first dose for students attending postsecondary  schools, health care workers, or international travelers. People who received inactivated measles vaccine or an unknown type of measles vaccine during 1963-1967 should receive 2 doses of MMR vaccine. People who received inactivated mumps vaccine or an unknown type of mumps vaccine before 1979 and are at high risk for mumps infection should consider immunization with 2 doses of MMR vaccine. Unvaccinated health care workers born before 1957 who lack laboratory evidence of measles, mumps, or rubella immunity or laboratory confirmation of disease should consider measles and mumps immunization with 2 doses of MMR vaccine or rubella immunization with 1 dose of MMR vaccine.  Pneumococcal 13-valent conjugate (PCV13) vaccine. When indicated, a person who is uncertain of his immunization history and has no record of immunization should receive the PCV13 vaccine. An adult aged 19 years or older who has certain medical conditions and has not been previously immunized should receive 1 dose of PCV13 vaccine. This PCV13 should be followed with a dose of pneumococcal polysaccharide (PPSV23) vaccine. The PPSV23 vaccine dose should be obtained at least 8 weeks after the dose of PCV13 vaccine. An adult aged 19 years or older who has certain medical conditions and  previously received 1 or more doses of PPSV23 vaccine should receive 1 dose of PCV13. The PCV13 vaccine dose should be obtained 1 or more years after the last PPSV23 vaccine dose.  Pneumococcal polysaccharide (PPSV23) vaccine. When PCV13 is also indicated, PCV13 should be obtained first. All adults aged 65 years and older should be immunized. An adult younger than age 65 years who has certain medical conditions should be immunized. Any person who resides in a nursing home or long-term care facility should be immunized. An adult smoker should be immunized. People with an immunocompromised condition and certain other conditions should receive both PCV13 and PPSV23 vaccines. People with human immunodeficiency virus (HIV) infection should be immunized as soon as possible after diagnosis. Immunization during chemotherapy or radiation therapy should be avoided. Routine use of PPSV23 vaccine is not recommended for American Indians, [place] Natives, or people younger than 65 years unless there are medical conditions that require PPSV23 vaccine. When indicated, people who have unknown immunization and have no record of immunization should receive PPSV23 vaccine. One-time revaccination 5 years after the first dose of PPSV23 is recommended for people aged 19-64 years who have chronic kidney failure, nephrotic syndrome, asplenia, or immunocompromised conditions. People who received 1-2 doses of PPSV23 before age 65 years should receive another dose of PPSV23 vaccine at age 65 years or later if at least 5 years have passed since the previous dose. Doses of PPSV23 are not needed for people immunized with PPSV23 at or after age 65 years.  Meningococcal vaccine. Adults with asplenia or persistent complement component deficiencies should receive 2 doses of quadrivalent meningococcal conjugate (MenACWY-D) vaccine. The doses should be obtained at least 2 months apart. Microbiologists working with certain meningococcal bacteria,  military recruits, people at risk during an outbreak, and people who travel to or live in countries with a high rate of meningitis should be immunized. A first-year college student up through age 21 years who is living in a residence hall should receive a dose if he did not receive a dose on or after his 16th birthday. Adults who have certain high-risk conditions should receive one or more doses of vaccine.  Hepatitis A vaccine. Adults who wish to be protected from this disease, have certain high-risk conditions, work with hepatitis A-infected animals, work in hepatitis A research labs, or   travel to or work in countries with a high rate of hepatitis A should be immunized. Adults who were previously unvaccinated and who anticipate close contact with an international adoptee during the first 60 days after arrival in the [place] from a country with a high rate of hepatitis A should be immunized.  Hepatitis B vaccine. Adults should be immunized if they wish to be protected from this disease, have certain high-risk conditions, may be exposed to blood or other infectious body fluids, are household contacts or sex partners of hepatitis B positive people, are clients or workers in certain care facilities, or travel to or work in countries with a high rate of hepatitis B.  Haemophilus influenzae type b (Hib) vaccine. A previously unvaccinated person with asplenia or sickle cell disease or having a scheduled splenectomy should receive 1 dose of Hib vaccine. Regardless of previous immunization, a recipient of a hematopoietic stem cell transplant should receive a 3-dose series 6-12 months after his successful transplant. Hib vaccine is not recommended for adults with HIV infection. Preventive Service / Frequency Ages 52 to 17  Blood pressure check.** / Every 1 to 2 years.  Lipid and cholesterol check.** / Every 5 years beginning at age 69.  Hepatitis C blood test.** / For any individual with known risks for  hepatitis C.  Skin self-exam. / Monthly.  Influenza vaccine. / Every year.  Tetanus, diphtheria, and acellular pertussis (Tdap, Td) vaccine.** / Consult your health care provider. 1 dose of Td every 10 years.  Varicella vaccine.** / Consult your health care provider.  HPV vaccine. / 3 doses over 6 months, if 72 or younger.  Measles, mumps, rubella (MMR) vaccine.** / You need at least 1 dose of MMR if you were born in 1957 or later. You may also need a second dose.  Pneumococcal 13-valent conjugate (PCV13) vaccine.** / Consult your health care provider.  Pneumococcal polysaccharide (PPSV23) vaccine.** / 1 to 2 doses if you smoke cigarettes or if you have certain conditions.  Meningococcal vaccine.** / 1 dose if you are age 35 to 60 years and a Market researcher living in a residence hall, or have one of several medical conditions. You may also need additional booster doses.  Hepatitis A vaccine.** / Consult your health care provider.  Hepatitis B vaccine.** / Consult your health care provider.  Haemophilus influenzae type b (Hib) vaccine.** / Consult your health care provider. Ages 35 to 8  Blood pressure check.** / Every 1 to 2 years.  Lipid and cholesterol check.** / Every 5 years beginning at age 57.  Lung cancer screening. / Every year if you are aged 44-80 years and have a 30-pack-year history of smoking and currently smoke or have quit within the past 15 years. Yearly screening is stopped once you have quit smoking for at least 15 years or develop a health problem that would prevent you from having lung cancer treatment.  Fecal occult blood test (FOBT) of stool. / Every year beginning at age 55 and continuing until age 73. You may not have to do this test if you get a colonoscopy every 10 years.  Flexible sigmoidoscopy** or colonoscopy.** / Every 5 years for a flexible sigmoidoscopy or every 10 years for a colonoscopy beginning at age 28 and continuing until age  1.  Hepatitis C blood test.** / For all people born from [birth year] through 1965 and any individual with known risks for hepatitis C.  Skin self-exam. / Monthly.  Influenza vaccine. / Every  year.  Tetanus, diphtheria, and acellular pertussis (Tdap/Td) vaccine.** / Consult your health care provider. 1 dose of Td every 10 years.  Varicella vaccine.** / Consult your health care provider.  Zoster vaccine.** / 1 dose for adults aged 53 years or older.  Measles, mumps, rubella (MMR) vaccine.** / You need at least 1 dose of MMR if you were born in 1957 or later. You may also need a second dose.  Pneumococcal 13-valent conjugate (PCV13) vaccine.** / Consult your health care provider.  Pneumococcal polysaccharide (PPSV23) vaccine.** / 1 to 2 doses if you smoke cigarettes or if you have certain conditions.  Meningococcal vaccine.** / Consult your health care provider.  Hepatitis A vaccine.** / Consult your health care provider.  Hepatitis B vaccine.** / Consult your health care provider.  Haemophilus influenzae type b (Hib) vaccine.** / Consult your health care provider. Ages 77 and over  Blood pressure check.** / Every 1 to 2 years.  Lipid and cholesterol check.**/ Every 5 years beginning at age 85.  Lung cancer screening. / Every year if you are aged 55-80 years and have a 30-pack-year history of smoking and currently smoke or have quit within the past 15 years. Yearly screening is stopped once you have quit smoking for at least 15 years or develop a health problem that would prevent you from having lung cancer treatment.  Fecal occult blood test (FOBT) of stool. / Every year beginning at age 33 and continuing until age 11. You may not have to do this test if you get a colonoscopy every 10 years.  Flexible sigmoidoscopy** or colonoscopy.** / Every 5 years for a flexible sigmoidoscopy or every 10 years for a colonoscopy beginning at age 28 and continuing until age 73.  Hepatitis C blood  test.** / For all people born from [birth year] through 1965 and any individual with known risks for hepatitis C.  Abdominal aortic aneurysm (AAA) screening.** / A one-time screening for ages 50 to 27 years who are current or former smokers.  Skin self-exam. / Monthly.  Influenza vaccine. / Every year.  Tetanus, diphtheria, and acellular pertussis (Tdap/Td) vaccine.** / 1 dose of Td every 10 years.  Varicella vaccine.** / Consult your health care provider.  Zoster vaccine.** / 1 dose for adults aged 34 years or older.  Pneumococcal 13-valent conjugate (PCV13) vaccine.** / Consult your health care provider.  Pneumococcal polysaccharide (PPSV23) vaccine.** / 1 dose for all adults aged 63 years and older.  Meningococcal vaccine.** / Consult your health care provider.  Hepatitis A vaccine.** / Consult your health care provider.  Hepatitis B vaccine.** / Consult your health care provider.  Haemophilus influenzae type b (Hib) vaccine.** / Consult your health care provider. **Family history and personal history of risk and conditions may change your health care provider's recommendations. Document Released: 05/09/2001 Document Revised: 03/18/2013 Document Reviewed: 08/08/2010 [facility] Patient Information 2015 [place], [place]. This information is not intended to replace advice given to you by your health care provider. Make sure you discuss any questions you have with your health care provider.

## 2013-10-20 NOTE — Progress Notes (Signed)
Subjective:    [name] is a 78 y.o. male who presents for Medicare Annual/Subsequent preventive examination.   Preventive Screening-Counseling & Management  Tobacco History  Smoking status  . Former Smoker  . Types: Cigarettes  . Quit date: 03/27/1989  Smokeless tobacco  . Not on file    Comment: smoked [place], up to 1 ppd    Problems Prior to Visit 1. none  Current Problems (verified) Patient Active Problem List   Diagnosis Date Noted  . ANGIOEDEMA 12/22/2009  . PROSTATE CANCER, HX OF 12/22/2009  . HYPERLIPIDEMIA 04/13/2009  . DIVERTICULOSIS, COLON 10/28/2007  . COLONIC POLYPS, HX OF 10/28/2007  . Pre-diabetes 11/22/2006  . HYPERTENSION 09/06/2006    Medications Prior to Visit Current Outpatient Prescriptions on File Prior to Visit  Medication Sig Dispense Refill  . amLODipine (NORVASC) 10 MG tablet TAKE 1 TABLET ONCE A DAY  90 tablet  3  . aspirin 81 MG tablet Take 1 tablet (81 mg total) by mouth daily.  30 tablet    . calcium carbonate (OS-CAL) 600 MG TABS Take 600 mg by mouth 2 (two) times daily with a meal.      . cholecalciferol (VITAMIN D) 1000 UNITS tablet Take 2,000 Units by mouth daily.      Marland Kitchen doxazosin (CARDURA) 1 MG tablet TAKE 1 TABLET BY MOUTH EVERY DAY  30 tablet  2  . hydrochlorothiazide (MICROZIDE) 12.5 MG capsule Take 1 capsule (12.5 mg total) by mouth daily.  90 capsule  3  . multivitamin-lutein (OCUVITE-LUTEIN) CAPS Take 1 capsule by mouth daily.      . pravastatin (PRAVACHOL) 20 MG tablet TAKE 1 TABLET AT BEDTIME  90 tablet  3   No current facility-administered medications on file prior to visit.    Current Medications (verified) Current Outpatient Prescriptions  Medication Sig Dispense Refill  . amLODipine (NORVASC) 10 MG tablet TAKE 1 TABLET ONCE A DAY  90 tablet  3  . aspirin 81 MG tablet Take 1 tablet (81 mg total) by mouth daily.  30 tablet    . calcium carbonate (OS-CAL) 600 MG TABS Take 600 mg by mouth 2 (two) times daily  with a meal.      . cholecalciferol (VITAMIN D) 1000 UNITS tablet Take 2,000 Units by mouth daily.      Marland Kitchen doxazosin (CARDURA) 1 MG tablet TAKE 1 TABLET BY MOUTH EVERY DAY  30 tablet  2  . hydrochlorothiazide (MICROZIDE) 12.5 MG capsule Take 1 capsule (12.5 mg total) by mouth daily.  90 capsule  3  . multivitamin-lutein (OCUVITE-LUTEIN) CAPS Take 1 capsule by mouth daily.      . pravastatin (PRAVACHOL) 20 MG tablet TAKE 1 TABLET AT BEDTIME  90 tablet  3   No current facility-administered medications for this visit.     Allergies (verified) Ace inhibitors; Angiotensin receptor blockers; and Ciprofloxacin   PAST HISTORY  Family History Family History  Problem Relation Age of Onset  . Kidney disease Father     ? Rheumatic fever as child  . Hypertension Father   . Heart attack Paternal Uncle     in 85s  . COPD Paternal Uncle     due to exposures in [facility] 2  . Cancer Neg Hx   . Diabetes Neg Hx   . Stroke Neg Hx   . Colon cancer Neg Hx   . Kidney disease Paternal Aunt     ? Rheumatic fever as child    Social History History  Substance Use Topics  . Smoking status: Former Smoker    Types: Cigarettes    Quit date: 03/27/1989  . Smokeless tobacco: Not on file     Comment: smoked [place], up to 1 ppd  . Alcohol Use: No    Are there smokers in your home (other than you)?  No  Risk Factors Current exercise habits: The patient does not participate in regular exercise at present.  Dietary issues discussed: na   Cardiac risk factors: advanced age (older than 46 for men, 22 for women), diabetes mellitus, dyslipidemia, hypertension, male gender and smoking/ tobacco exposure.  Depression Screen (Note: if answer to either of the following is "Yes", a more complete depression screening is indicated)   Q1: Over the past two weeks, have you felt down, depressed or hopeless? No  Q2: Over the past two weeks, have you felt little interest or pleasure in doing things? No  Have you lost  interest or pleasure in daily life? No  Do you often feel hopeless? No  Do you cry easily over simple problems? No  Activities of Daily Living In your present state of health, do you have any difficulty performing the following activities?:  Driving? No Managing money?  No Feeding yourself? No Getting from bed to chair? No Climbing a flight of stairs? No Preparing food and eating?: No Bathing or showering? No Getting dressed: No Getting to the toilet? No Using the toilet:No Moving around from place to place: No In the past year have you fallen or had a near fall?:No   Are you sexually active?  No  Do you have more than one partner?  No  Hearing Difficulties: No Do you often ask people to speak up or repeat themselves? No Do you experience ringing or noises in your ears? No Do you have difficulty understanding soft or whispered voices? No   Do you feel that you have a problem with memory? No  Do you often misplace items? No  Do you feel safe at home?  Yes  Cognitive Testing  Alert? Yes  Normal Appearance?Yes  Oriented to person? Yes  Place? Yes   Time? Yes  Recall of three objects?  Yes  Can perform simple calculations? Yes  Displays appropriate judgment?Yes  Can read the correct time from a watch face?Yes   Advanced Directives have been discussed with the patient? Yes   List the Names of Other Physician/Practitioners you currently use: 1.  opth-- [facility] 2  Dentist--[facility] 3. urol- [facility]   Indicate any recent Medical Services you may have received from other than [facility] providers in the past year (date may be approximate).  Immunization History  Administered Date(s) Administered  . Influenza Split 12/11/2011  . Influenza, High Dose Seasonal PF 01/23/2013  . Pneumococcal Polysaccharide-23 12/25/2004  . Td 04/13/2009  . Zoster 10/10/2011    Screening Tests Health Maintenance  Topic Date Due  . Foot Exam  02/08/1946  . Urine Microalbumin  09/13/2012   . Hemoglobin A1c  04/20/2013  . Influenza Vaccine  10/25/2013  . Ophthalmology Exam  09/23/2014  . Colonoscopy  05/28/2017  . Tetanus/tdap  04/14/2019  . Pneumococcal Polysaccharide Vaccine Age 22 And Over  Completed  . Zostavax  Completed    All answers were reviewed with the patient and necessary referrals were made:  Garnet Koyanagi, DO   10/20/2013   History reviewed:  He  has a past medical history of Femoral bruit; Macular degeneration; Squamous papilloma; Hyperlipidemia; Hypertension; Prostate cancer;  GERD with stricture; Diabetes mellitus; and Arthritis. He  does not have any pertinent problems on file. He  has past surgical history that includes esophageal dilation (2002); Ankle Fusion (2004); Partial colectomy; Rotator cuff repair (2006); Total hip arthroplasty (2012); Hernia repair; Prostate biopsy (03/29/2012); and Colonoscopy (05/2012). His family history includes COPD in his paternal uncle; Heart attack in his paternal uncle; Hypertension in his father; Kidney disease in his father and paternal aunt. There is no history of Cancer, Diabetes, Stroke, or Colon cancer. He  reports that he quit smoking about 24 years ago. His smoking use included Cigarettes. He smoked 0.00 packs per day. He does not have any smokeless tobacco history on file. He reports that he does not drink alcohol or use illicit drugs. He has a current medication list which includes the following prescription(s): amlodipine, aspirin, calcium carbonate, cholecalciferol, doxazosin, hydrochlorothiazide, multivitamin-lutein, and pravastatin. Current Outpatient Prescriptions on File Prior to Visit  Medication Sig Dispense Refill  . aspirin 81 MG tablet Take 1 tablet (81 mg total) by mouth daily.  30 tablet    . calcium carbonate (OS-CAL) 600 MG TABS Take 600 mg by mouth 2 (two) times daily with a meal.      . cholecalciferol (VITAMIN D) 1000 UNITS tablet Take 2,000 Units by mouth daily.      . multivitamin-lutein  (OCUVITE-LUTEIN) CAPS Take 1 capsule by mouth daily.       No current facility-administered medications on file prior to visit.   He is allergic to ace inhibitors; angiotensin receptor blockers; and ciprofloxacin.  Review of Systems  Review of Systems  Constitutional: Negative for activity change, appetite change and fatigue.  HENT: Negative for hearing loss, congestion, tinnitus and ear discharge.   Eyes: Negative for visual disturbance (see optho q1y -- vision corrected to 20/20 with glasses).  Respiratory: Negative for cough, chest tightness and shortness of breath.   Cardiovascular: Negative for chest pain, palpitations and leg swelling.  Gastrointestinal: Negative for abdominal pain, diarrhea, constipation and abdominal distention.  Genitourinary: Negative for urgency, frequency, decreased urine volume and difficulty urinating.  Musculoskeletal: Negative for back pain, arthralgias and gait problem.  Skin: Negative for color change, pallor and rash.  Neurological: Negative for dizziness, light-headedness, numbness and headaches.  Hematological: Negative for adenopathy. Does not bruise/bleed easily.  Psychiatric/Behavioral: Negative for suicidal ideas, confusion, sleep disturbance, self-injury, dysphoric mood, decreased concentration and agitation.  Pt is able to read and write and can do all ADLs No risk for falling No abuse/ violence in home      Objective:     Vision by Snellen chart: opth Blood pressure 146/70, pulse 90, temperature 98.3 F (36.8 C), temperature source Oral, height 5\' 10"  (1.778 m), weight 136 lb (61.689 kg), SpO2 98.00%. Body mass index is 19.51 kg/(m^2).  BP 146/70  Pulse 90  Temp(Src) 98.3 F (36.8 C) (Oral)  Ht 5\' 10"  (1.778 m)  Wt 136 lb (61.689 kg)  BMI 19.51 kg/m2  SpO2 98% General appearance: alert, cooperative, appears stated age and no distress Head: Normocephalic, without obvious abnormality, atraumatic Eyes: conjunctivae/corneas clear.  PERRL, EOM's intact. Fundi benign. Ears: normal TM's and external ear canals both ears Nose: Nares normal. Septum midline. Mucosa normal. No drainage or sinus tenderness. Throat: lips, mucosa, and tongue normal; teeth and gums normal Neck: no adenopathy, no carotid bruit, no JVD, supple, symmetrical, trachea midline and thyroid not enlarged, symmetric, no tenderness/mass/nodules Back: symmetric, no curvature. ROM normal. No CVA tenderness. Lungs: clear to auscultation bilaterally  Chest wall: no tenderness Heart: regular rate and rhythm, S1, S2 normal, no murmur, click, rub or gallop Abdomen: soft, non-tender; bowel sounds normal; no masses,  no organomegaly Male genitalia: deferred--urology Rectal: deferred--urology Extremities: extremities normal, atraumatic, no cyanosis or edema Pulses: 2+ and symmetric Skin: Skin color, texture, turgor normal. No rashes or lesions Lymph nodes: Cervical, supraclavicular, and axillary nodes normal. Neurologic: Alert and oriented X 3, normal strength and tone. Normal symmetric reflexes. Normal coordination and gait Psych-- no depression, no anxiety,       Assessment:     cpe      Plan:    check labs  During the course of the visit the patient was educated and counseled about appropriate screening and preventive services including:    Pneumococcal vaccine   Screening electrocardiogram  Prostate cancer screening  Colorectal cancer screening  Diabetes screening  Glaucoma screening  Advanced directives: has an advanced directive - a copy HAS NOT been provided.  Diet review for nutrition referral? Yes ____  Not Indicated _x___   Patient Instructions (the written plan) was given to the patient.  Medicare Attestation I have personally reviewed: The patient's medical and social history Their use of alcohol, tobacco or illicit drugs Their current medications and supplements The patient's functional ability including ADLs,fall risks,  home safety risks, cognitive, and hearing and visual impairment Diet and physical activities Evidence for depression or mood disorders  The patient's weight, height, BMI, and visual acuity have been recorded in the chart.  I have made referrals, counseling, and provided education to the patient based on review of the above and I have provided the patient with a written personalized care plan for preventive services.    1. Other and unspecified hyperlipidemia Check labs, con't meds - pravastatin (PRAVACHOL) 20 MG tablet; TAKE 1 TABLET AT BEDTIME  Dispense: 90 tablet; Refill: 3 - hydrochlorothiazide (MICROZIDE) 12.5 MG capsule; Take 1 capsule (12.5 mg total) by mouth daily.  Dispense: 90 capsule; Refill: 3 - Hepatic function panel; Future - Lipid panel; Future  2. Type I (juvenile type) diabetes mellitus with peripheral circulatory disorders, uncontrolled(250.73) Check labs--- pt told he was no longer diabetic by Dr Linna Darner  3. Essential hypertension stable - amLODipine (NORVASC) 10 MG tablet; TAKE 1 TABLET ONCE A DAY  Dispense: 90 tablet; Refill: 3  4. History of prostate cancer Per urology  5. Medicare annual wellness visit, subsequent    6. HYPERLIPIDEMIA Check labs - pravastatin (PRAVACHOL) 20 MG tablet; TAKE 1 TABLET AT BEDTIME  Dispense: 90 tablet; Refill: 3 - hydrochlorothiazide (MICROZIDE) 12.5 MG capsule; Take 1 capsule (12.5 mg total) by mouth daily.  Dispense: 90 capsule; Refill: 3 - Hepatic function panel; Future - Lipid panel; Future  7. DIABETES-TYPE 2  - pravastatin (PRAVACHOL) 20 MG tablet; TAKE 1 TABLET AT BEDTIME  Dispense: 90 tablet; Refill: 3 - hydrochlorothiazide (MICROZIDE) 12.5 MG capsule; Take 1 capsule (12.5 mg total) by mouth daily.  Dispense: 90 capsule; Refill: 3 - Basic metabolic panel; Future - Hemoglobin A1c; Future - POCT urinalysis dipstick; Future  8. HYPERTENSION  - pravastatin (PRAVACHOL) 20 MG tablet; TAKE 1 TABLET AT BEDTIME  Dispense: 90  tablet; Refill: 3 - hydrochlorothiazide (MICROZIDE) 12.5 MG capsule; Take 1 capsule (12.5 mg total) by mouth daily.  Dispense: 90 capsule; Refill: 3 - CBC with Differential; Future - POCT urinalysis dipstick; Future  9. Annual physical exam  - pravastatin (PRAVACHOL) 20 MG tablet; TAKE 1 TABLET AT BEDTIME  Dispense: 90 tablet; Refill: 3 -  hydrochlorothiazide (MICROZIDE) 12.5 MG capsule; Take 1 capsule (12.5 mg total) by mouth daily.  Dispense: 90 capsule; Refill: 3  10. Unspecified essential hypertension  - pravastatin (PRAVACHOL) 20 MG tablet; TAKE 1 TABLET AT BEDTIME  Dispense: 90 tablet; Refill: 3 - hydrochlorothiazide (MICROZIDE) 12.5 MG capsule; Take 1 capsule (12.5 mg total) by mouth daily.  Dispense: 90 capsule; Refill: 3 - CBC with Differential; Future - POCT urinalysis dipstick; Future  11. Need for pneumococcal vaccination  - Pneumococcal conjugate vaccine 13-valent  Garnet Koyanagi, DO   10/20/2013

## 2013-10-24 ENCOUNTER — Other Ambulatory Visit (INDEPENDENT_AMBULATORY_CARE_PROVIDER_SITE_OTHER): Payer: Medicare Other

## 2013-10-24 ENCOUNTER — Other Ambulatory Visit: Payer: Self-pay | Admitting: Family Medicine

## 2013-10-24 DIAGNOSIS — R7309 Other abnormal glucose: Secondary | ICD-10-CM | POA: Diagnosis not present

## 2013-10-24 DIAGNOSIS — I10 Essential (primary) hypertension: Secondary | ICD-10-CM | POA: Diagnosis not present

## 2013-10-24 DIAGNOSIS — E785 Hyperlipidemia, unspecified: Secondary | ICD-10-CM

## 2013-10-24 DIAGNOSIS — R7303 Prediabetes: Secondary | ICD-10-CM

## 2013-10-24 DIAGNOSIS — E119 Type 2 diabetes mellitus without complications: Secondary | ICD-10-CM

## 2013-10-24 LAB — CBC WITH DIFFERENTIAL/PLATELET
BASOS PCT: 0.3 % (ref 0.0–3.0)
Basophils Absolute: 0 10*3/uL (ref 0.0–0.1)
EOS ABS: 0.4 10*3/uL (ref 0.0–0.7)
EOS PCT: 6.2 % — AB (ref 0.0–5.0)
HEMATOCRIT: 45.2 % (ref 39.0–52.0)
Hemoglobin: 14.9 g/dL (ref 13.0–17.0)
LYMPHS ABS: 1.4 10*3/uL (ref 0.7–4.0)
Lymphocytes Relative: 23.7 % (ref 12.0–46.0)
MCHC: 33 g/dL (ref 30.0–36.0)
MCV: 83.3 fl (ref 78.0–100.0)
MONO ABS: 0.4 10*3/uL (ref 0.1–1.0)
Monocytes Relative: 6.6 % (ref 3.0–12.0)
Neutro Abs: 3.8 10*3/uL (ref 1.4–7.7)
Neutrophils Relative %: 63.2 % (ref 43.0–77.0)
Platelets: 214 10*3/uL (ref 150.0–400.0)
RBC: 5.42 Mil/uL (ref 4.22–5.81)
RDW: 13.6 % (ref 11.5–15.5)
WBC: 6.1 10*3/uL (ref 4.0–10.5)

## 2013-10-24 LAB — LIPID PANEL
Cholesterol: 155 mg/dL (ref 0–200)
HDL: 44.7 mg/dL (ref >39.00)
LDL Cholesterol: 92 mg/dL (ref 0–99)
NonHDL: 110.3
Total CHOL/HDL Ratio: 3
Triglycerides: 94 mg/dL (ref 0.0–149.0)
VLDL: 18.8 mg/dL (ref 0.0–40.0)

## 2013-10-24 LAB — HEPATIC FUNCTION PANEL
ALBUMIN: 4 g/dL (ref 3.5–5.2)
ALT: 26 U/L (ref 0–53)
AST: 28 U/L (ref 0–37)
Alkaline Phosphatase: 65 U/L (ref 39–117)
Bilirubin, Direct: 0.1 mg/dL (ref 0.0–0.3)
Total Bilirubin: 0.8 mg/dL (ref 0.2–1.2)
Total Protein: 7.1 g/dL (ref 6.0–8.3)

## 2013-10-24 LAB — BASIC METABOLIC PANEL
BUN: 14 mg/dL (ref 6–23)
CO2: 31 meq/L (ref 19–32)
Calcium: 9.2 mg/dL (ref 8.4–10.5)
Chloride: 105 mEq/L (ref 96–112)
Creatinine, Ser: 0.9 mg/dL (ref 0.4–1.5)
GFR: 102.95 mL/min (ref >60.00)
Glucose, Bld: 131 mg/dL — ABNORMAL HIGH (ref 70–99)
POTASSIUM: 3.4 meq/L — AB (ref 3.5–5.1)
Sodium: 143 mEq/L (ref 135–145)

## 2013-10-24 LAB — URINALYSIS
Bilirubin Urine: NEGATIVE
Ketones, ur: NEGATIVE
LEUKOCYTES UA: NEGATIVE
NITRITE: NEGATIVE
PH: 7 (ref 5.0–8.0)
Specific Gravity, Urine: 1.01 (ref 1.000–1.030)
Urine Glucose: NEGATIVE
Urobilinogen, UA: 0.2 (ref 0.0–1.0)

## 2013-10-24 LAB — HEMOGLOBIN A1C: Hgb A1c MFr Bld: 7 % — ABNORMAL HIGH (ref 4.6–6.5)

## 2013-10-27 ENCOUNTER — Telehealth: Payer: Self-pay | Admitting: Family Medicine

## 2013-10-27 NOTE — Telephone Encounter (Signed)
Relevant patient education mailed to patient.  

## 2013-10-31 ENCOUNTER — Ambulatory Visit (INDEPENDENT_AMBULATORY_CARE_PROVIDER_SITE_OTHER): Payer: Medicare Other | Admitting: Family Medicine

## 2013-10-31 ENCOUNTER — Encounter: Payer: Self-pay | Admitting: Family Medicine

## 2013-10-31 VITALS — BP 136/80 | HR 82 | Temp 98.0°F | Wt 136.0 lb

## 2013-10-31 DIAGNOSIS — IMO0002 Reserved for concepts with insufficient information to code with codable children: Secondary | ICD-10-CM | POA: Diagnosis not present

## 2013-10-31 DIAGNOSIS — E118 Type 2 diabetes mellitus with unspecified complications: Principal | ICD-10-CM

## 2013-10-31 DIAGNOSIS — E1165 Type 2 diabetes mellitus with hyperglycemia: Secondary | ICD-10-CM | POA: Diagnosis not present

## 2013-10-31 MED ORDER — GLUCOSE BLOOD VI STRP: ORAL_STRIP | Status: DC

## 2013-10-31 NOTE — Progress Notes (Signed)
Pre visit review using our clinic review tool, if applicable. No additional management support is needed unless otherwise documented below in the visit note. 

## 2013-10-31 NOTE — Patient Instructions (Signed)
Diabetes and Standards of Medical Care Diabetes is complicated. You may find that your diabetes team includes a dietitian, nurse, diabetes educator, eye doctor, and more. To help everyone know what is going on and to help you get the care you deserve, the following schedule of care was developed to help keep you on track. Below are the tests, exams, vaccines, medicines, education, and plans you will need. HbA1c test This test shows how well you have controlled your glucose over the past 2-3 months. It is used to see if your diabetes management plan needs to be adjusted.   It is performed at least 2 times a year if you are meeting treatment goals.  It is performed 4 times a year if therapy has changed or if you are not meeting treatment goals. Blood pressure test  This test is performed at every routine medical visit. The goal is less than 140/90 mm Hg for most people, but 130/80 mm Hg in some cases. Ask your health care provider about your goal. Dental exam  Follow up with the dentist regularly. Eye exam  If you are diagnosed with type 1 diabetes as a child, get an exam upon reaching the age of 37 years or older and have had diabetes for 3-5 years. Yearly eye exams are recommended after that initial eye exam.  If you are diagnosed with type 1 diabetes as an adult, get an exam within 5 years of diagnosis and then yearly.  If you are diagnosed with type 2 diabetes, get an exam as soon as possible after the diagnosis and then yearly. Foot care exam  Visual foot exams are performed at every routine medical visit. The exams check for cuts, injuries, or other problems with the feet.  A comprehensive foot exam should be done yearly. This includes visual inspection as well as assessing foot pulses and testing for loss of sensation.  Check your feet nightly for cuts, injuries, or other problems with your feet. Tell your health care provider if anything is not healing. Kidney function test (urine  microalbumin)  This test is performed once a year.  Type 1 diabetes: The first test is performed 5 years after diagnosis.  Type 2 diabetes: The first test is performed at the time of diagnosis.  A serum creatinine and estimated glomerular filtration rate (eGFR) test is done once a year to assess the level of chronic kidney disease (CKD), if present. Lipid profile (cholesterol, HDL, LDL, triglycerides)  Performed every 5 years for most people.  The goal for LDL is less than 100 mg/dL. If you are at high risk, the goal is less than 70 mg/dL.  The goal for HDL is 40 mg/dL-50 mg/dL for men and 50 mg/dL-60 mg/dL for women. An HDL cholesterol of 60 mg/dL or higher gives some protection against heart disease.  The goal for triglycerides is less than 150 mg/dL. Influenza vaccine, pneumococcal vaccine, and hepatitis B vaccine  The influenza vaccine is recommended yearly.  It is recommended that people with diabetes who are over 24 years old get the pneumonia vaccine. In some cases, two separate shots may be given. Ask your health care provider if your pneumonia vaccination is up to date.  The hepatitis B vaccine is also recommended for adults with diabetes. Diabetes self-management education  Education is recommended at diagnosis and ongoing as needed. Treatment plan  Your treatment plan is reviewed at every medical visit. Document Released: 01/08/2009 Document Revised: 07/28/2013 Document Reviewed: 08/13/2012 [facility] Patient Information 2015 [place],  [employer]. This information is not intended to replace advice given to you by your health care provider. Make sure you discuss any questions you have with your health care provider.  

## 2013-11-01 NOTE — Progress Notes (Signed)
   Subjective:    Patient ID: [name], male    DOB: 19-Apr-1935, 78 y.o.   MRN: [id number]  HPI Pt here to go over labs and get diabetic teaching.    Review of Systems As above     Objective:   Physical Exam BP 136/80  Pulse 82  Temp(Src) 98 F (36.7 C) (Oral)  Wt 136 lb (61.689 kg)  SpO2 97% General appearance: alert, cooperative, appears stated age and no distress Lungs: clear to auscultation bilaterally Heart: regular rate and rhythm, S1, S2 normal, no murmur, click, rub or gallop Extremities: extremities normal, atraumatic, no cyanosis or edema        Assessment & Plan:  1. Type II or unspecified type diabetes mellitus with unspecified complication, uncontrolled Pt labs reviewed--  Lab Results  Component Value Date   HGBA1C 7.0* 10/24/2013  glucometer training Discussed diet -- pt states he has been eating more sugar than in past-- he will work on that Fax or my chart-- glucose readings rto 3 months

## 2013-11-07 ENCOUNTER — Telehealth: Payer: Self-pay | Admitting: Family Medicine

## 2013-11-07 MED ORDER — GLUCOSE BLOOD VI STRP
ORAL_STRIP | Status: DC
Start: 2013-11-07 — End: 2014-09-25

## 2013-11-07 MED ORDER — ONETOUCH DELICA LANCETS 33G MISC: Status: DC

## 2013-11-07 NOTE — Telephone Encounter (Signed)
I called to [facility] and they said they do not bill Medicare for the Test strips/supplies, they would need to be sent to the local pharmacy. I made the patient aware and he was ok with that. Rx faxed       KP

## 2013-11-07 NOTE — Telephone Encounter (Signed)
Caller name: Kaidon  Relation to pt: self  Call back number: [phone]  Reason for call: pt would like to know how he will receiv glucose testing supplies he stated this is a new method.

## 2014-01-22 ENCOUNTER — Ambulatory Visit (INDEPENDENT_AMBULATORY_CARE_PROVIDER_SITE_OTHER): Payer: Medicare Other

## 2014-01-22 DIAGNOSIS — Z23 Encounter for immunization: Secondary | ICD-10-CM

## 2014-02-02 ENCOUNTER — Ambulatory Visit: Payer: Medicare Other | Admitting: Family Medicine

## 2014-03-17 ENCOUNTER — Encounter: Payer: Self-pay | Admitting: Internal Medicine

## 2014-03-17 ENCOUNTER — Ambulatory Visit (INDEPENDENT_AMBULATORY_CARE_PROVIDER_SITE_OTHER): Payer: Medicare Other | Admitting: Internal Medicine

## 2014-03-17 ENCOUNTER — Other Ambulatory Visit (INDEPENDENT_AMBULATORY_CARE_PROVIDER_SITE_OTHER): Payer: Medicare Other

## 2014-03-17 VITALS — BP 148/76 | HR 86 | Temp 98.3°F | Resp 14 | Wt 135.4 lb

## 2014-03-17 DIAGNOSIS — I10 Essential (primary) hypertension: Secondary | ICD-10-CM | POA: Diagnosis not present

## 2014-03-17 DIAGNOSIS — H40003 Preglaucoma, unspecified, bilateral: Secondary | ICD-10-CM

## 2014-03-17 DIAGNOSIS — E119 Type 2 diabetes mellitus without complications: Secondary | ICD-10-CM | POA: Diagnosis not present

## 2014-03-17 DIAGNOSIS — E876 Hypokalemia: Secondary | ICD-10-CM | POA: Diagnosis not present

## 2014-03-17 LAB — HEMOGLOBIN A1C: HEMOGLOBIN A1C: 7.4 % — AB (ref 4.6–6.5)

## 2014-03-17 LAB — POTASSIUM: Potassium: 3.6 mEq/L (ref 3.5–5.1)

## 2014-03-17 LAB — MICROALBUMIN / CREATININE URINE RATIO
Creatinine,U: 125.4 mg/dL
Microalb Creat Ratio: 3.9 mg/g (ref 0.0–30.0)
Microalb, Ur: 4.9 mg/dL — ABNORMAL HIGH (ref 0.0–1.9)

## 2014-03-17 NOTE — Progress Notes (Signed)
Pre visit review using our clinic review tool, if applicable. No additional management support is needed unless otherwise documented below in the visit note. 

## 2014-03-17 NOTE — Assessment & Plan Note (Signed)
K

## 2014-03-17 NOTE — Progress Notes (Signed)
   Subjective:    Patient ID: [name], male    DOB: [birth year], 78 y.o.   MRN: [id number]  HPI He is here to assess status of active health conditions.  PMH, FH, & Social History reviewed & updated.  Diet/ nutrition: Modified heart healthy, no added salt Exercise program: Biking 2 miles a day 5 days a week without cardiopulmonary symptoms  HYPERTENSION: Disease Monitoring: No monitor Medication Compliance:yes    Diabetes :  FBS range/average: Average fasting blood sugar 140 Highest 2 hr post meal glucose:< 230 Medication compliance: no meds Hypoglycemia:no Ophthamology care: UTD , no retinopathy but glaucoma suspect Podiatry care: not UTD  HYPERLIPIDEMIA: Disease Monitoring: Medication Compliance:yes In July of this year renal function lipids were at goal. His potassium was slightly reduced at 3.4.   A1c was 7%. There is no urine microalbumin on record since 6/13. At that time it was 4.3    Review of Systems  Chest pain, palpitations: no      Dyspnea:no Edema:no Claudication: no Lightheadedness,Syncope:no Weight gain/loss:up 10 # over 10 years Polyuria/phagia/dipsia:no      Blurred vision /diplopia/lossof vision: no Limb numbness/tingling/burning:no Non healing skin lesions:no Abd pain, bowel changes: no  Myalgias: no but L ankle pain due to DJD Memory loss:no Loss of sense of smell post op in 2009       Objective:   Physical Exam  Gen.: Thin but healthy  & well-nourished, appropriate and alert. Pattern alopecia; small goatee Eyes: No lid/conjunctival changes, extraocular motion intact, pupils small Nose: septum deviated to R Neck: Normal range of motion, thyroid Respiratory: No increased work of breathing or abnormal breath sounds Cardiac : regular rhythm, no extra heart sounds, gallop, murmur.S4 Abdomen: No organomegaly ,masses, bruits or aortic enlargement Lymph: No lymphadenopathy of the neck or axilla Skin: No rashes, lesions, ulcers or  ischemic changes Muscle skeletal: no nail changes; joints Vasc:All pulses intact, no bruits present Neuro: Normal deep tendon reflexes, alert & oriented X3 Psych: judgment and insight, mood and affect normal        Assessment & Plan:  See Current Assessment & Plan in Problem List under specific Diagnosis

## 2014-03-17 NOTE — Patient Instructions (Signed)

## 2014-03-17 NOTE — Assessment & Plan Note (Signed)
A1c , urine microalbumin 

## 2014-03-17 NOTE — Assessment & Plan Note (Signed)
Blood pressure goals reviewed.  

## 2014-03-18 DIAGNOSIS — H40009 Preglaucoma, unspecified, unspecified eye: Secondary | ICD-10-CM | POA: Insufficient documentation

## 2014-03-19 ENCOUNTER — Other Ambulatory Visit: Payer: Self-pay | Admitting: Internal Medicine

## 2014-03-19 DIAGNOSIS — E119 Type 2 diabetes mellitus without complications: Secondary | ICD-10-CM

## 2014-03-30 DIAGNOSIS — H40023 Open angle with borderline findings, high risk, bilateral: Secondary | ICD-10-CM | POA: Diagnosis not present

## 2014-03-30 DIAGNOSIS — H04123 Dry eye syndrome of bilateral lacrimal glands: Secondary | ICD-10-CM | POA: Diagnosis not present

## 2014-03-30 LAB — HM DIABETES EYE EXAM

## 2014-04-15 DIAGNOSIS — C61 Malignant neoplasm of prostate: Secondary | ICD-10-CM | POA: Diagnosis not present

## 2014-04-20 ENCOUNTER — Encounter: Payer: Self-pay | Admitting: Internal Medicine

## 2014-04-22 ENCOUNTER — Ambulatory Visit: Payer: Medicare Other | Admitting: Family Medicine

## 2014-04-22 DIAGNOSIS — C61 Malignant neoplasm of prostate: Secondary | ICD-10-CM | POA: Diagnosis not present

## 2014-04-23 ENCOUNTER — Ambulatory Visit: Payer: Medicare Other | Admitting: Family Medicine

## 2014-04-27 DIAGNOSIS — H40001 Preglaucoma, unspecified, right eye: Secondary | ICD-10-CM | POA: Diagnosis not present

## 2014-06-15 DIAGNOSIS — M19071 Primary osteoarthritis, right ankle and foot: Secondary | ICD-10-CM | POA: Diagnosis not present

## 2014-06-15 DIAGNOSIS — M19072 Primary osteoarthritis, left ankle and foot: Secondary | ICD-10-CM | POA: Diagnosis not present

## 2014-06-16 ENCOUNTER — Encounter: Payer: Self-pay | Admitting: Internal Medicine

## 2014-08-28 DIAGNOSIS — H40023 Open angle with borderline findings, high risk, bilateral: Secondary | ICD-10-CM | POA: Diagnosis not present

## 2014-09-23 ENCOUNTER — Ambulatory Visit (INDEPENDENT_AMBULATORY_CARE_PROVIDER_SITE_OTHER): Payer: Medicare Other | Admitting: Internal Medicine

## 2014-09-23 ENCOUNTER — Other Ambulatory Visit (INDEPENDENT_AMBULATORY_CARE_PROVIDER_SITE_OTHER): Payer: Medicare Other

## 2014-09-23 ENCOUNTER — Encounter: Payer: Self-pay | Admitting: Internal Medicine

## 2014-09-23 VITALS — BP 148/82 | HR 85 | Temp 97.8°F | Resp 14 | Wt 132.0 lb

## 2014-09-23 DIAGNOSIS — I10 Essential (primary) hypertension: Secondary | ICD-10-CM

## 2014-09-23 DIAGNOSIS — R1314 Dysphagia, pharyngoesophageal phase: Secondary | ICD-10-CM | POA: Diagnosis not present

## 2014-09-23 DIAGNOSIS — E785 Hyperlipidemia, unspecified: Secondary | ICD-10-CM

## 2014-09-23 DIAGNOSIS — E119 Type 2 diabetes mellitus without complications: Secondary | ICD-10-CM

## 2014-09-23 LAB — BASIC METABOLIC PANEL
BUN: 19 mg/dL (ref 6–23)
CHLORIDE: 103 meq/L (ref 96–112)
CO2: 31 mEq/L (ref 19–32)
Calcium: 10 mg/dL (ref 8.4–10.5)
Creatinine, Ser: 1.14 mg/dL (ref 0.40–1.50)
GFR: 80.19 mL/min (ref >60.00)
Glucose, Bld: 150 mg/dL — ABNORMAL HIGH (ref 70–99)
Potassium: 3.5 mEq/L (ref 3.5–5.1)
Sodium: 144 mEq/L (ref 135–145)

## 2014-09-23 LAB — CBC WITH DIFFERENTIAL/PLATELET
Basophils Absolute: 0 10*3/uL (ref 0.0–0.1)
Basophils Relative: 0.5 % (ref 0.0–3.0)
Eosinophils Absolute: 0.2 10*3/uL (ref 0.0–0.7)
Eosinophils Relative: 3.3 % (ref 0.0–5.0)
HCT: 46.3 % (ref 39.0–52.0)
Hemoglobin: 15.6 g/dL (ref 13.0–17.0)
LYMPHS PCT: 20.8 % (ref 12.0–46.0)
Lymphs Abs: 1.2 10*3/uL (ref 0.7–4.0)
MCHC: 33.7 g/dL (ref 30.0–36.0)
MCV: 81.9 fl (ref 78.0–100.0)
MONO ABS: 0.4 10*3/uL (ref 0.1–1.0)
Monocytes Relative: 7.1 % (ref 3.0–12.0)
NEUTROS PCT: 68.3 % (ref 43.0–77.0)
Neutro Abs: 4.1 10*3/uL (ref 1.4–7.7)
PLATELETS: 207 10*3/uL (ref 150.0–400.0)
RBC: 5.65 Mil/uL (ref 4.22–5.81)
RDW: 14.2 % (ref 11.5–15.5)
WBC: 6 10*3/uL (ref 4.0–10.5)

## 2014-09-23 LAB — HEPATIC FUNCTION PANEL
ALBUMIN: 4.4 g/dL (ref 3.5–5.2)
ALT: 19 U/L (ref 0–53)
AST: 26 U/L (ref 0–37)
Alkaline Phosphatase: 73 U/L (ref 39–117)
Bilirubin, Direct: 0.1 mg/dL (ref 0.0–0.3)
TOTAL PROTEIN: 7.8 g/dL (ref 6.0–8.3)
Total Bilirubin: 0.5 mg/dL (ref 0.2–1.2)

## 2014-09-23 LAB — LIPID PANEL
CHOLESTEROL: 160 mg/dL (ref 0–200)
HDL: 49.6 mg/dL (ref >39.00)
LDL Cholesterol: 97 mg/dL (ref 0–99)
NonHDL: 110.4
Total CHOL/HDL Ratio: 3
Triglycerides: 69 mg/dL (ref 0.0–149.0)
VLDL: 13.8 mg/dL (ref 0.0–40.0)

## 2014-09-23 LAB — TSH: TSH: 1.29 u[IU]/mL (ref 0.35–4.50)

## 2014-09-23 LAB — HEMOGLOBIN A1C: Hgb A1c MFr Bld: 7.3 % — ABNORMAL HIGH (ref 4.6–6.5)

## 2014-09-23 LAB — MICROALBUMIN / CREATININE URINE RATIO
CREATININE, U: 162.1 mg/dL
MICROALB UR: 5.8 mg/dL — AB (ref 0.0–1.9)
Microalb Creat Ratio: 3.6 mg/g (ref 0.0–30.0)

## 2014-09-23 NOTE — Assessment & Plan Note (Signed)
Lipids, LFTs, TSH  

## 2014-09-23 NOTE — Progress Notes (Signed)
   Subjective:    Patient ID: [name], male    DOB: 11-24-1935, 79 y.o.   MRN: [id number]  HPI The patient is here to assess status of active health conditions.  PMH, FH, & Social History reviewed & updated.  Last A1c on record was 7.4% in December 2015.  FBS average: 150 2 hour post meal glucose :170s-200s;few isolated 300s Hypoglycemia not reported Ophthalmologic exam is current ;no retinopathy present. Foot care not current Diet is heart healthy , low carb,no fried foods Exercise  as biking 2 miles  4-5x/week  BP averages 120s/60-70   He describes occasional dysphasia up to 1-2 times per week with certain foods such as cheese. His last endoscopy was 2002. The esophagus was dilated at that time. His last colonoscopy was in March 2014;diverticulosis present.  Review of Systems   Polyuria, polyphagia, polydipsia absent.  There is no blurred vision, double vision, or loss of vision.   No postural dizziness noted. Denied are numbness, tingling, or burning of the extremities.  No nonhealing skin lesions present.  Weight is stable.   Chest pain, palpitations, tachycardia, exertional dyspnea, paroxysmal nocturnal dyspnea, claudication or edema are absent.  Unexplained weight loss, abdominal pain, significant dyspepsia,  melena, rectal bleeding, or persistently small caliber stools are denied.  Post nasal drainage is a chronic issue.       Objective:   Physical Exam  Pertinent or positive findings include:Pattern alopecia is present. He has a small mustache. Nasal septum is deviated to the right. The first heart sound is accentuated. Click is suggested at the apex. He has slight crepitus of the knees.  General appearance :Thin but adequately nourished; in no distress.  Eyes: No conjunctival inflammation or scleral icterus is present.  Oral exam:  Lips and gums are healthy appearing.There is no oropharyngeal erythema or exudate noted. Dental hygiene is good.  Heart:   Normal rate and regular rhythm. S2 normal without gallop, murmur, rub or other extra sounds    Lungs:Chest clear to auscultation; no wheezes, rhonchi,rales ,or rubs present.No increased work of breathing.   Abdomen: bowel sounds normal, soft and non-tender without masses, organomegaly or hernias noted.  No guarding or rebound.   Vascular : all pulses equal ; no bruits present.  Skin:Warm & dry.  Intact without suspicious lesions or rashes ; no tenting   Lymphatic: No lymphadenopathy is noted about the head, neck, axilla  Neuro: Strength, tone & DTRs normal.        Assessment & Plan:  See Current Assessment & Plan in Problem List under specific Diagnosis

## 2014-09-23 NOTE — Assessment & Plan Note (Signed)
Blood pressure goals reviewed. BMET 

## 2014-09-23 NOTE — Assessment & Plan Note (Signed)
CBC

## 2014-09-23 NOTE — Progress Notes (Signed)
Pre visit review using our clinic review tool, if applicable. No additional management support is needed unless otherwise documented below in the visit note. 

## 2014-09-23 NOTE — Patient Instructions (Signed)
Reflux of gastric acid may be asymptomatic as this may occur mainly during sleep.The triggers for reflux  include stress; the "aspirin family" ; alcohol; peppermint; and caffeine (coffee, tea, cola, and chocolate). The aspirin family would include aspirin and the nonsteroidal agents such as ibuprofen &  Naproxen. Tylenol would not cause reflux. If having symptoms ; food & drink should be avoided for @ least 2 hours before going to bed.   Plain Mucinex (NOT D) for thick secretions ;force NON dairy fluids .   Nasal cleansing in the shower as discussed with lather of mild shampoo.After 10 seconds wash off lather while  exhaling through nostrils. Make sure that all residual soap is removed to prevent irritation.  Flonase OR Nasacort AQ 1 spray in each nostril twice a day as needed. Use the "crossover" technique into opposite nostril spraying toward opposite ear @ 45 degree angle, not straight up into nostril.  Plain Allegra (NOT D )  160 daily , Loratidine 10 mg , OR Zyrtec 10 mg @ bedtime  as needed for itchy eyes & sneezing.  Minimal Blood Pressure Goal= AVERAGE < 140/90;  Ideal is an AVERAGE < 135/85. This AVERAGE should be calculated from @ least 5-7 BP readings taken @ different times of day on different days of week. You should not respond to isolated BP readings , but rather the AVERAGE for that week .Please bring your  blood pressure cuff to office visits to verify that it is reliable.It  can also be checked against the blood pressure device at the pharmacy. Finger or wrist cuffs are not dependable; an arm cuff is.

## 2014-09-23 NOTE — Assessment & Plan Note (Signed)
A1c , urine microalbumin, BMET 

## 2014-09-24 ENCOUNTER — Telehealth: Payer: Self-pay | Admitting: Internal Medicine

## 2014-09-24 NOTE — Telephone Encounter (Signed)
Patient stated that pharmacy haven't received his prescriptions.

## 2014-09-25 ENCOUNTER — Other Ambulatory Visit: Payer: Self-pay | Admitting: Emergency Medicine

## 2014-09-25 DIAGNOSIS — Z0000 Encounter for general adult medical examination without abnormal findings: Secondary | ICD-10-CM

## 2014-09-25 DIAGNOSIS — I10 Essential (primary) hypertension: Secondary | ICD-10-CM

## 2014-09-25 DIAGNOSIS — E785 Hyperlipidemia, unspecified: Secondary | ICD-10-CM

## 2014-09-25 DIAGNOSIS — E119 Type 2 diabetes mellitus without complications: Secondary | ICD-10-CM

## 2014-09-25 MED ORDER — AMLODIPINE BESYLATE 10 MG PO TABS: ORAL_TABLET | ORAL | Status: DC

## 2014-09-25 MED ORDER — DOXAZOSIN MESYLATE 1 MG PO TABS: ORAL_TABLET | ORAL | Status: DC

## 2014-09-25 MED ORDER — ONETOUCH DELICA LANCETS 33G MISC: Status: DC

## 2014-09-25 MED ORDER — GLUCOSE BLOOD VI STRP: ORAL_STRIP | Status: DC

## 2014-09-25 MED ORDER — PRAVASTATIN SODIUM 20 MG PO TABS: ORAL_TABLET | ORAL | Status: DC

## 2014-09-25 MED ORDER — HYDROCHLOROTHIAZIDE 12.5 MG PO CAPS: 12.5000 mg | ORAL_CAPSULE | Freq: Every day | ORAL | Status: DC

## 2014-09-25 NOTE — Telephone Encounter (Signed)
Refills have been updated and sent to pharm

## 2014-10-06 ENCOUNTER — Encounter: Payer: Self-pay | Admitting: Gastroenterology

## 2014-10-29 DIAGNOSIS — C61 Malignant neoplasm of prostate: Secondary | ICD-10-CM | POA: Diagnosis not present

## 2014-11-04 DIAGNOSIS — C61 Malignant neoplasm of prostate: Secondary | ICD-10-CM | POA: Diagnosis not present

## 2014-11-04 DIAGNOSIS — N400 Enlarged prostate without lower urinary tract symptoms: Secondary | ICD-10-CM | POA: Diagnosis not present

## 2014-11-23 ENCOUNTER — Other Ambulatory Visit: Payer: Self-pay | Admitting: Emergency Medicine

## 2014-11-23 ENCOUNTER — Other Ambulatory Visit: Payer: Self-pay | Admitting: Internal Medicine

## 2014-11-23 MED ORDER — GLUCOSE BLOOD VI STRP: ORAL_STRIP | Status: DC

## 2014-12-28 DIAGNOSIS — H04123 Dry eye syndrome of bilateral lacrimal glands: Secondary | ICD-10-CM | POA: Diagnosis not present

## 2014-12-28 DIAGNOSIS — E119 Type 2 diabetes mellitus without complications: Secondary | ICD-10-CM | POA: Diagnosis not present

## 2014-12-28 DIAGNOSIS — H25813 Combined forms of age-related cataract, bilateral: Secondary | ICD-10-CM | POA: Diagnosis not present

## 2014-12-28 DIAGNOSIS — H40023 Open angle with borderline findings, high risk, bilateral: Secondary | ICD-10-CM | POA: Diagnosis not present

## 2014-12-28 LAB — HM DIABETES EYE EXAM

## 2014-12-30 ENCOUNTER — Encounter: Payer: Self-pay | Admitting: Internal Medicine

## 2015-02-02 ENCOUNTER — Ambulatory Visit (INDEPENDENT_AMBULATORY_CARE_PROVIDER_SITE_OTHER): Payer: Medicare Other

## 2015-02-02 DIAGNOSIS — Z23 Encounter for immunization: Secondary | ICD-10-CM | POA: Diagnosis not present

## 2015-04-30 DIAGNOSIS — H40023 Open angle with borderline findings, high risk, bilateral: Secondary | ICD-10-CM | POA: Diagnosis not present

## 2015-04-30 DIAGNOSIS — H04123 Dry eye syndrome of bilateral lacrimal glands: Secondary | ICD-10-CM | POA: Diagnosis not present

## 2015-05-20 DIAGNOSIS — Z0000 Encounter for general adult medical examination without abnormal findings: Secondary | ICD-10-CM | POA: Diagnosis not present

## 2015-05-20 DIAGNOSIS — C61 Malignant neoplasm of prostate: Secondary | ICD-10-CM | POA: Diagnosis not present

## 2015-06-17 ENCOUNTER — Encounter: Payer: Self-pay | Admitting: Internal Medicine

## 2015-06-17 ENCOUNTER — Other Ambulatory Visit (INDEPENDENT_AMBULATORY_CARE_PROVIDER_SITE_OTHER): Payer: Medicare Other

## 2015-06-17 ENCOUNTER — Ambulatory Visit (INDEPENDENT_AMBULATORY_CARE_PROVIDER_SITE_OTHER): Payer: Medicare Other | Admitting: Internal Medicine

## 2015-06-17 VITALS — BP 134/76 | HR 75 | Temp 98.0°F | Resp 16 | Wt 131.0 lb

## 2015-06-17 DIAGNOSIS — E119 Type 2 diabetes mellitus without complications: Secondary | ICD-10-CM

## 2015-06-17 DIAGNOSIS — E785 Hyperlipidemia, unspecified: Secondary | ICD-10-CM | POA: Diagnosis not present

## 2015-06-17 DIAGNOSIS — I10 Essential (primary) hypertension: Secondary | ICD-10-CM

## 2015-06-17 DIAGNOSIS — H409 Unspecified glaucoma: Secondary | ICD-10-CM | POA: Diagnosis not present

## 2015-06-17 DIAGNOSIS — R430 Anosmia: Secondary | ICD-10-CM

## 2015-06-17 DIAGNOSIS — H3530 Unspecified macular degeneration: Secondary | ICD-10-CM

## 2015-06-17 DIAGNOSIS — R0981 Nasal congestion: Secondary | ICD-10-CM

## 2015-06-17 DIAGNOSIS — Z0000 Encounter for general adult medical examination without abnormal findings: Secondary | ICD-10-CM

## 2015-06-17 LAB — COMPREHENSIVE METABOLIC PANEL
ALBUMIN: 4.1 g/dL (ref 3.5–5.2)
ALK PHOS: 68 U/L (ref 39–117)
ALT: 25 U/L (ref 0–53)
AST: 38 U/L — AB (ref 0–37)
BILIRUBIN TOTAL: 0.6 mg/dL (ref 0.2–1.2)
BUN: 14 mg/dL (ref 6–23)
CO2: 33 mEq/L — ABNORMAL HIGH (ref 19–32)
CREATININE: 1.18 mg/dL (ref 0.40–1.50)
Calcium: 9.5 mg/dL (ref 8.4–10.5)
Chloride: 100 mEq/L (ref 96–112)
GFR: 76.91 mL/min (ref >60.00)
GLUCOSE: 138 mg/dL — AB (ref 70–99)
POTASSIUM: 3.4 meq/L — AB (ref 3.5–5.1)
SODIUM: 142 meq/L (ref 135–145)
TOTAL PROTEIN: 7.7 g/dL (ref 6.0–8.3)

## 2015-06-17 LAB — CBC WITH DIFFERENTIAL/PLATELET
BASOS ABS: 0 10*3/uL (ref 0.0–0.1)
Basophils Relative: 0.5 % (ref 0.0–3.0)
EOS PCT: 0.8 % (ref 0.0–5.0)
Eosinophils Absolute: 0 10*3/uL (ref 0.0–0.7)
HCT: 44.5 % (ref 39.0–52.0)
HEMOGLOBIN: 15.3 g/dL (ref 13.0–17.0)
LYMPHS ABS: 1 10*3/uL (ref 0.7–4.0)
Lymphocytes Relative: 25.8 % (ref 12.0–46.0)
MCHC: 34.4 g/dL (ref 30.0–36.0)
MCV: 80.2 fl (ref 78.0–100.0)
MONO ABS: 0.5 10*3/uL (ref 0.1–1.0)
Monocytes Relative: 11.5 % (ref 3.0–12.0)
NEUTROS PCT: 61.4 % (ref 43.0–77.0)
Neutro Abs: 2.4 10*3/uL (ref 1.4–7.7)
Platelets: 160 10*3/uL (ref 150.0–400.0)
RBC: 5.55 Mil/uL (ref 4.22–5.81)
RDW: 13.8 % (ref 11.5–15.5)
WBC: 3.9 10*3/uL — AB (ref 4.0–10.5)

## 2015-06-17 LAB — TSH: TSH: 1.74 u[IU]/mL (ref 0.35–4.50)

## 2015-06-17 LAB — LIPID PANEL
CHOLESTEROL: 153 mg/dL (ref 0–200)
HDL: 44.9 mg/dL (ref >39.00)
LDL CALC: 83 mg/dL (ref 0–99)
NonHDL: 107.73
TRIGLYCERIDES: 123 mg/dL (ref 0.0–149.0)
Total CHOL/HDL Ratio: 3
VLDL: 24.6 mg/dL (ref 0.0–40.0)

## 2015-06-17 LAB — MICROALBUMIN / CREATININE URINE RATIO
Creatinine,U: 174.2 mg/dL
Microalb Creat Ratio: 9 mg/g (ref 0.0–30.0)
Microalb, Ur: 15.7 mg/dL — ABNORMAL HIGH (ref 0.0–1.9)

## 2015-06-17 LAB — HEMOGLOBIN A1C: HEMOGLOBIN A1C: 7.9 % — AB (ref 4.6–6.5)

## 2015-06-17 MED ORDER — AMOXICILLIN 500 MG PO CAPS: 500.0000 mg | ORAL_CAPSULE | Freq: Three times a day (TID) | ORAL | Status: DC

## 2015-06-17 NOTE — Assessment & Plan Note (Signed)
Taking pravastatin Check lipid panel, CMP

## 2015-06-17 NOTE — Assessment & Plan Note (Signed)
Lab Results  Component Value Date   HGBA1C 7.3* 09/23/2014   Diabetes fairly controlled without medication Continue regular exercise and healthy diet Check A1c, urine microalbumin Eye exams up-to-date Foot exam completed

## 2015-06-17 NOTE — Progress Notes (Signed)
Subjective:    Patient ID: [name], male    DOB: [birth year], 80 y.o.   MRN: [id number]  HPI He is here to establish with a new pcp.   Here for medicare wellness exam.   He has had cold symptoms for about one week. He has mild stomach ache, chest congestion, dry cough But can hear the phlegm in his chest, chills, lower back ache.  He denies fever, wheeze, sob.  He took [employer] plus.   I have personally reviewed and have noted 1.The patient's medical and social history 2.Their use of alcohol, tobacco or illicit drugs 3.Their current medications and supplements 4.The patient's functional ability including ADL's, fall risks, home safety risks and                 hearing or visual impairment. 5.Diet and physical activities 6.Evidence for depression or mood disorders    Are there smokers in your home (other than you)? No  Risk Factors Exercise: bikes, yard work  Dietary issues discussed: healthy, good portions  Cardiac risk factors: advanced age (older than 54 for men, 59 for women), hypertension, hyperlipidemia, diabetes  Depression Screen  Have you felt down, depressed or hopeless? No  Have you felt little interest or pleasure in doing things?  No Activities of Daily Living In your present state of health, do you have any difficulty performing the following activities?:  Driving? No Managing money?  No Feeding yourself? No Getting from bed to chair? No Climbing a flight of stairs? No Preparing food and eating?: No Bathing or showering? No Getting dressed: No Getting to/using the toilet? No Moving around from place to place: No In the past year have you fallen or had a near fall?: No   Are you sexually active?  yes  Do you have more than one partner?  N/A  Hearing Difficulties: No Do you often ask people to speak up or repeat themselves? No Do you experience ringing or noises in your ears? No Do you  have difficulty understanding soft or whispered voices? No Vision:              Any change in vision: no - has macular degeneration             Up to date with eye exam: up to date Memory:  Do you feel that you have a problem with memory? No  Do you often misplace items? No  Do you feel safe at home?  Yes  Cognitive Testing  Alert, Orientated? Yes  Normal Appearance? Yes  Recall of three objects?  Yes  Can perform simple calculations? Yes  Displays appropriate judgment? Yes  Can read the correct time from a watch face? Yes   Advanced Directives have been discussed with the patient? Yes   Diabetes: He is controlling his sugars with diet.  He was diagnosed about 4-5 years ago. He is compliant with a diabetic diet. He is exercising regularly - rides a bike, yard work. He monitors his sugars and they have been running 160's - 170's. He checks his feet daily and denies foot lesions. He is up-to-date with an ophthalmology examination.   Hypertension: He is taking his medication daily. He is compliant with a low sodium diet.  He denies chest pain, palpitations, shortness of breath and regular headaches. He is exercising regularly.  He does not monitor his blood pressure at home.    Hyperlipidemia: He is taking his medication daily. He is compliant  with a low fat/cholesterol diet. He is exercising regularly. He denies myalgias.    Medications and allergies reviewed with patient and updated if appropriate.  Patient Active Problem List   Diagnosis Date Noted  . Dysphagia, pharyngoesophageal phase 09/23/2014  . Glaucoma suspect 03/18/2014  . Hypokalemia 03/17/2014  . ANGIOEDEMA 12/22/2009  . PROSTATE CANCER, HX OF 12/22/2009  . Hyperlipidemia 04/13/2009  . DIVERTICULOSIS, COLON 10/28/2007  . COLONIC POLYPS, HX OF 10/28/2007  . Diabetes type 2, controlled ([place]) 11/22/2006  . Essential hypertension 09/06/2006    Current Outpatient Prescriptions on File Prior to Visit  Medication Sig  Dispense Refill  . amLODipine (NORVASC) 10 MG tablet TAKE 1 TABLET ONCE A DAY 90 tablet 3  . aspirin 81 MG tablet Take 1 tablet (81 mg total) by mouth daily. 30 tablet   . calcium carbonate (OS-CAL) 600 MG TABS Take 600 mg by mouth 2 (two) times daily with a meal.    . cholecalciferol (VITAMIN D) 1000 UNITS tablet Take 2,000 Units by mouth daily.    Marland Kitchen doxazosin (CARDURA) 1 MG tablet TAKE 1 TABLET BY MOUTH EVERY DAY 90 tablet 3  . glucose blood (ONETOUCH VERIO) test strip Check blood sugar once daily 100 each 12  . hydrochlorothiazide (MICROZIDE) 12.5 MG capsule Take 1 capsule (12.5 mg total) by mouth daily. 90 capsule 3  . multivitamin-lutein (OCUVITE-LUTEIN) CAPS Take 1 capsule by mouth daily.    Glory Rosebush DELICA LANCETS 99991111 MISC Check blood sugar once daily 100 each 12  . ONETOUCH VERIO test strip CHECK BLOOD SUGAR ONCE DAILY. DX250.00 100 each 5  . pravastatin (PRAVACHOL) 20 MG tablet TAKE 1 TABLET AT BEDTIME 90 tablet 3   No current facility-administered medications on file prior to visit.    Past Medical History  Diagnosis Date  . Femoral bruit     R femoral  . Macular degeneration     glaucoma suspect  . Squamous papilloma     of esophagus  . Hyperlipidemia   . Hypertension   . Prostate cancer [facility])     prostate ; Dr Junious Silk  . GERD with stricture     PMH of  . Diabetes mellitus     diet controlled  . Arthritis     R hip and shoulder    Past Surgical History  Procedure Laterality Date  . Esophageal dilation  2002    Dr  Lucio Edward  . Ankle fusion  2004  . Partial colectomy      perforated diverticulitis  . Rotator cuff repair  2006  . Total hip arthroplasty  2012  . Hernia repair      bil inguinal hernias  . Prostate biopsy  03/29/2012    Procedure: BIOPSY TRANSRECTAL ULTRASONIC PROSTATE (TUBP);  Surgeon: Fredricka Bonine, MD;  Location: [facility];  Service: Urology;  Laterality: N/A;  . Colonoscopy  05/2012    Social History    Social History  . Marital Status: Married    Spouse Name: N/A  . Number of Children: N/A  . Years of Education: N/A   Social History Main Topics  . Smoking status: Former Smoker    Types: Cigarettes    Quit date: 03/27/1989  . Smokeless tobacco: None     Comment: smoked [place], up to 1 ppd  . Alcohol Use: No  . Drug Use: No  . Sexual Activity: Not Asked   Other Topics Concern  . None   Social History Narrative  Family History  Problem Relation Age of Onset  . Kidney disease Father     ? Rheumatic fever as child  . Hypertension Father   . Heart attack Paternal Uncle     in 72s  . COPD Paternal Uncle     due to exposures in [facility] 2  . Cancer Neg Hx   . Diabetes Neg Hx   . Stroke Neg Hx   . Colon cancer Neg Hx   . Kidney disease Paternal Aunt     ? Rheumatic fever as child    Review of Systems  Constitutional: Positive for appetite change (decreased). Negative for fever.  HENT: Positive for congestion (Acute on chronic). Negative for ear pain, hearing loss, sinus pressure, sore throat and tinnitus.        No sense of smell-started after abdominal surgery  Eyes: Positive for visual disturbance (macular degeneration).  Respiratory: Positive for cough (dry) and chest tightness. Negative for shortness of breath and wheezing.   Cardiovascular: Positive for leg swelling. Negative for chest pain and palpitations.  Gastrointestinal: Positive for nausea (mild with cold) and abdominal pain (with cold). Negative for diarrhea, constipation and anal bleeding.       Occ gerd  Endocrine: Positive for polydipsia and polyuria.  Genitourinary: Negative for dysuria and hematuria.  Musculoskeletal: Positive for arthralgias (ankles). Negative for myalgias and back pain.  Skin: Negative for rash.  Neurological: Negative for dizziness, light-headedness and headaches.  Psychiatric/Behavioral: Negative for dysphoric mood. The patient is not nervous/anxious.        Objective:    Filed Vitals:   06/17/15 1016  BP: 134/76  Pulse: 75  Temp: 98 F (36.7 C)  Resp: 16   Filed Weights   06/17/15 1016  Weight: 131 lb (59.421 kg)   Body mass index is 18.8 kg/(m^2).   Physical Exam Constitutional: He appears well-developed and well-nourished. No distress.  HENT:  Head: Normocephalic and atraumatic.  Right Ear: External ear normal.  Left Ear: External ear normal.  Mouth/Throat: Oropharynx is clear and moist.  Normal ear canals and TM b/l  Eyes: Conjunctivae and EOM are normal.  Neck: Neck supple. No tracheal deviation present. No thyromegaly present.  No carotid bruit  Cardiovascular: Normal rate, regular rhythm, normal heart sounds and intact distal pulses.   No murmur heard. Pulmonary/Chest: Effort normal and breath sounds normal. No respiratory distress. He has no wheezes. He has no rales.  Abdominal: Soft. Bowel sounds are normal. He exhibits no distension. There is no tenderness.  Genitourinary: deferred  Musculoskeletal: He exhibits no edema.  Lymphadenopathy:    He has no cervical adenopathy.  Skin: Skin is warm and dry. He is not diaphoretic.  Psychiatric: He has a normal mood and affect. His behavior is normal.         Assessment & Plan:   Wellness Exam Immunizations-Up-to-date EKG-last EKG in 2014, reviewed-normal. No need to repeat Colonoscopy-up-to-date Eye exam-up-to-date, follows regularly for his macular degeneration Hearing loss-none Memory concerns/difficulties - none Independent of ADLs - completely independent  Loss of smell, chronic nasal congestion He has never had loss of smell evaluated. This started after his abdominal surgery several years ago Will refer to ENT  See Problem List for Assessment and Plan of chronic medical problems.  Follow-up in 6 months

## 2015-06-17 NOTE — Patient Instructions (Addendum)
  [name] , Thank you for taking time to come for your Medicare Wellness Visit. I appreciate your ongoing commitment to your health goals. Please review the following plan we discussed and let me know if I can assist you in the future.   These are the goals we discussed: Goals    None      This is a list of the screening recommended for you and due dates:  Health Maintenance  Topic Date Due  . Complete foot exam   11/01/2014  . Hemoglobin A1C  03/25/2015  . Urine Protein Check  09/23/2015  . Flu Shot  10/26/2015  . Eye exam for diabetics  12/28/2015  . Colon Cancer Screening  05/28/2017  . Tetanus Vaccine  04/14/2019  . Shingles Vaccine  Completed  . Pneumonia vaccines  Completed     Test(s) ordered today. Your results will be released to [place] (or called to you) after review, usually within 72hours after test completion. If any changes need to be made, you will be notified at that same time.  All other Health Maintenance issues reviewed.   All recommended immunizations and age-appropriate screenings are up-to-date or discussed.  No immunizations administered today.   Medications reviewed and updated.  No changes recommended at this time in your routine medications.  An antibiotic was sent in for your cold.   Your prescription(s) have been submitted to your pharmacy. Please take as directed and contact our office if you believe you are having problem(s) with the medication(s).  A referral for ENT was ordered.   Please followup in 6 months

## 2015-06-17 NOTE — Progress Notes (Signed)
Pre visit review using our clinic review tool, if applicable. No additional management support is needed unless otherwise documented below in the visit note. 

## 2015-06-17 NOTE — Assessment & Plan Note (Signed)
BP well controlled Current regimen effective and well tolerated Continue current medications at current doses Check CMP, TSH

## 2015-06-20 ENCOUNTER — Encounter: Payer: Self-pay | Admitting: Internal Medicine

## 2015-07-16 DIAGNOSIS — J3489 Other specified disorders of nose and nasal sinuses: Secondary | ICD-10-CM | POA: Diagnosis not present

## 2015-07-16 DIAGNOSIS — R430 Anosmia: Secondary | ICD-10-CM | POA: Diagnosis not present

## 2015-07-16 DIAGNOSIS — J342 Deviated nasal septum: Secondary | ICD-10-CM | POA: Diagnosis not present

## 2015-07-23 DIAGNOSIS — R430 Anosmia: Secondary | ICD-10-CM | POA: Diagnosis not present

## 2015-07-23 DIAGNOSIS — J342 Deviated nasal septum: Secondary | ICD-10-CM | POA: Diagnosis not present

## 2015-09-06 DIAGNOSIS — H04123 Dry eye syndrome of bilateral lacrimal glands: Secondary | ICD-10-CM | POA: Diagnosis not present

## 2015-09-06 DIAGNOSIS — H40023 Open angle with borderline findings, high risk, bilateral: Secondary | ICD-10-CM | POA: Diagnosis not present

## 2015-09-07 ENCOUNTER — Other Ambulatory Visit (INDEPENDENT_AMBULATORY_CARE_PROVIDER_SITE_OTHER): Payer: Medicare Other

## 2015-09-07 ENCOUNTER — Ambulatory Visit (INDEPENDENT_AMBULATORY_CARE_PROVIDER_SITE_OTHER): Payer: Medicare Other | Admitting: Internal Medicine

## 2015-09-07 ENCOUNTER — Encounter: Payer: Self-pay | Admitting: Internal Medicine

## 2015-09-07 VITALS — BP 148/84 | HR 89 | Temp 98.5°F | Resp 16 | Wt 131.0 lb

## 2015-09-07 DIAGNOSIS — R109 Unspecified abdominal pain: Secondary | ICD-10-CM | POA: Diagnosis not present

## 2015-09-07 LAB — COMPREHENSIVE METABOLIC PANEL
ALBUMIN: 4.2 g/dL (ref 3.5–5.2)
ALK PHOS: 60 U/L (ref 39–117)
ALT: 19 U/L (ref 0–53)
AST: 23 U/L (ref 0–37)
BILIRUBIN TOTAL: 0.5 mg/dL (ref 0.2–1.2)
BUN: 24 mg/dL — ABNORMAL HIGH (ref 6–23)
CALCIUM: 9.4 mg/dL (ref 8.4–10.5)
CHLORIDE: 103 meq/L (ref 96–112)
CO2: 32 mEq/L (ref 19–32)
CREATININE: 1.26 mg/dL (ref 0.40–1.50)
GFR: 71.26 mL/min (ref >60.00)
Glucose, Bld: 191 mg/dL — ABNORMAL HIGH (ref 70–99)
Potassium: 3.4 mEq/L — ABNORMAL LOW (ref 3.5–5.1)
Sodium: 144 mEq/L (ref 135–145)
TOTAL PROTEIN: 7 g/dL (ref 6.0–8.3)

## 2015-09-07 LAB — URINALYSIS, ROUTINE W REFLEX MICROSCOPIC
Bilirubin Urine: NEGATIVE
HGB URINE DIPSTICK: NEGATIVE
LEUKOCYTES UA: NEGATIVE
NITRITE: NEGATIVE
Specific Gravity, Urine: 1.02 (ref 1.000–1.030)
URINE GLUCOSE: NEGATIVE
Urobilinogen, UA: 0.2 (ref 0.0–1.0)
pH: 6 (ref 5.0–8.0)

## 2015-09-07 LAB — CBC WITH DIFFERENTIAL/PLATELET
BASOS ABS: 0 10*3/uL (ref 0.0–0.1)
BASOS PCT: 0.3 % (ref 0.0–3.0)
EOS ABS: 0.5 10*3/uL (ref 0.0–0.7)
Eosinophils Relative: 7.5 % — ABNORMAL HIGH (ref 0.0–5.0)
HEMATOCRIT: 42.1 % (ref 39.0–52.0)
HEMOGLOBIN: 14.1 g/dL (ref 13.0–17.0)
LYMPHS PCT: 20.8 % (ref 12.0–46.0)
Lymphs Abs: 1.3 10*3/uL (ref 0.7–4.0)
MCHC: 33.4 g/dL (ref 30.0–36.0)
MCV: 81 fl (ref 78.0–100.0)
Monocytes Absolute: 0.6 10*3/uL (ref 0.1–1.0)
Monocytes Relative: 10 % (ref 3.0–12.0)
Neutro Abs: 3.8 10*3/uL (ref 1.4–7.7)
Neutrophils Relative %: 61.4 % (ref 43.0–77.0)
Platelets: 178 10*3/uL (ref 150.0–400.0)
RBC: 5.2 Mil/uL (ref 4.22–5.81)
RDW: 14.5 % (ref 11.5–15.5)
WBC: 6.2 10*3/uL (ref 4.0–10.5)

## 2015-09-07 LAB — MICROALBUMIN / CREATININE URINE RATIO
Creatinine,U: 224.7 mg/dL
MICROALB UR: 5.3 mg/dL — AB (ref 0.0–1.9)
Microalb Creat Ratio: 2.4 mg/g (ref 0.0–30.0)

## 2015-09-07 LAB — LIPASE: LIPASE: 37 U/L (ref 11.0–59.0)

## 2015-09-07 LAB — AMYLASE: AMYLASE: 97 U/L (ref 27–131)

## 2015-09-07 NOTE — Patient Instructions (Signed)
  Test(s) ordered today. Your results will be released to [place] (or called to you) after review, usually within 72hours after test completion. If any changes need to be made, you will be notified at that same time.  An ultrasound of your abdomen was ordered - we will call you to schedule this.   If your symptoms change please call immediately.  Medications reviewed and updated.  No changes recommended at this time.

## 2015-09-07 NOTE — Progress Notes (Signed)
Subjective:    Patient ID: [name], male    DOB: 06-Oct-1935, 80 y.o.   MRN: [id number]  HPI He is here for an acute visit.   Stomach discomfort, Not true pain:  It started about two weeks ago, maybe longer.  It started as a slight ache and he thought it was something he ate.  It has been persistent.  Two days ago it was more painful and radiated to his back.  He thinks after he eats it is sometimes worse, but not always.  He has bloating after eating.  He stomach growls more.  He has taken gas-ex a couple of times - he is unsure if it has helped. He does have a history of diverticulitis resulting in a partial colectomy. He does have some chronic constipation, but this is not new. He denies any blood in the stool. He denies nausea, chest pain, fever, changes in his appetite or weight. He has not been experiencing any urinary symptoms.  Medications and allergies reviewed with patient and updated if appropriate.  Patient Active Problem List   Diagnosis Date Noted  . Glaucoma 06/17/2015  . Macular degeneration 06/17/2015  . Dysphagia, pharyngoesophageal phase 09/23/2014  . Hypokalemia 03/17/2014  . ANGIOEDEMA 12/22/2009  . PROSTATE CANCER, HX OF 12/22/2009  . Hyperlipidemia 04/13/2009  . DIVERTICULOSIS, COLON 10/28/2007  . COLONIC POLYPS, HX OF 10/28/2007  . Diabetes type 2, controlled ([place]) 11/22/2006  . Essential hypertension 09/06/2006    Current Outpatient Prescriptions on File Prior to Visit  Medication Sig Dispense Refill  . amLODipine (NORVASC) 10 MG tablet TAKE 1 TABLET ONCE A DAY 90 tablet 3  . amoxicillin (AMOXIL) 500 MG capsule Take 1 capsule (500 mg total) by mouth 3 (three) times daily. 30 capsule 0  . aspirin 81 MG tablet Take 1 tablet (81 mg total) by mouth daily. 30 tablet   . calcium carbonate (OS-CAL) 600 MG TABS Take 600 mg by mouth 2 (two) times daily with a meal.    . cholecalciferol (VITAMIN D) 1000 UNITS tablet Take 2,000 Units by mouth daily.    Marland Kitchen  doxazosin (CARDURA) 1 MG tablet TAKE 1 TABLET BY MOUTH EVERY DAY 90 tablet 3  . glucose blood (ONETOUCH VERIO) test strip Check blood sugar once daily 100 each 12  . hydrochlorothiazide (MICROZIDE) 12.5 MG capsule Take 1 capsule (12.5 mg total) by mouth daily. 90 capsule 3  . latanoprost (XALATAN) 0.005 % ophthalmic solution Place 1 drop into both eyes at bedtime.  1  . multivitamin-lutein (OCUVITE-LUTEIN) CAPS Take 1 capsule by mouth daily.    Glory Rosebush DELICA LANCETS 99991111 MISC Check blood sugar once daily 100 each 12  . ONETOUCH VERIO test strip CHECK BLOOD SUGAR ONCE DAILY. DX250.00 100 each 5  . pravastatin (PRAVACHOL) 20 MG tablet TAKE 1 TABLET AT BEDTIME 90 tablet 3   No current facility-administered medications on file prior to visit.    Past Medical History  Diagnosis Date  . Femoral bruit     R femoral  . Macular degeneration     glaucoma suspect  . Squamous papilloma     of esophagus  . Hyperlipidemia   . Hypertension   . Prostate cancer [facility])     prostate ; Dr Junious Silk  . GERD with stricture     PMH of  . Diabetes mellitus     diet controlled  . Arthritis     R hip and shoulder    Past Surgical History  Procedure Laterality Date  . Esophageal dilation  2002    Dr  Lucio Edward  . Ankle fusion  2004  . Partial colectomy      perforated diverticulitis  . Rotator cuff repair  2006  . Total hip arthroplasty  2012  . Hernia repair      bil inguinal hernias  . Prostate biopsy  03/29/2012    Procedure: BIOPSY TRANSRECTAL ULTRASONIC PROSTATE (TUBP);  Surgeon: Fredricka Bonine, MD;  Location[facility];  Service: Urology;  Laterality: N/A;  . Colonoscopy  05/2012    Social History   Social History  . Marital Status: Married    Spouse Name: N/A  . Number of Children: N/A  . Years of Education: N/A   Social History Main Topics  . Smoking status: Former Smoker    Types: Cigarettes    Quit date: 03/27/1989  . Smokeless tobacco: None      Comment: smoked [place], up to 1 ppd  . Alcohol Use: No  . Drug Use: No  . Sexual Activity: Not Asked   Other Topics Concern  . None   Social History Narrative    Family History  Problem Relation Age of Onset  . Kidney disease Father     ? Rheumatic fever as child  . Hypertension Father   . Heart attack Paternal Uncle     in 82s  . COPD Paternal Uncle     due to exposures in [facility]  . Cancer Neg Hx   . Diabetes Neg Hx   . Stroke Neg Hx   . Colon cancer Neg Hx   . Kidney disease Paternal Aunt     ? Rheumatic fever as child    Review of Systems  Constitutional: Negative for fever, chills, appetite change and unexpected weight change.  Respiratory: Negative for cough, shortness of breath and wheezing.   Cardiovascular: Negative for chest pain, palpitations and leg swelling.  Gastrointestinal: Positive for abdominal pain, constipation and abdominal distention (bloating). Negative for nausea and blood in stool.       Bouts of gerd - intermittent for years  Genitourinary: Negative for dysuria, hematuria and difficulty urinating.  Neurological: Negative for light-headedness and headaches.       Objective:   Filed Vitals:   09/07/15 1436  BP: 148/84  Pulse: 89  Temp: 98.5 F (36.9 C)  Resp: 16   Filed Weights   09/07/15 1436  Weight: 131 lb (59.421 kg)   Body mass index is 18.8 kg/(m^2).   Physical Exam  Constitutional: He appears well-developed and well-nourished. No distress.  HENT:  Head: Normocephalic and atraumatic.  Cardiovascular: Normal rate, regular rhythm and normal heart sounds.   No murmur heard. Pulmonary/Chest: Effort normal and breath sounds normal. No respiratory distress. He has no wheezes. He has no rales.  Abdominal: Soft. He exhibits no distension and no mass. There is no tenderness. There is no rebound and no guarding.  Scar mid lower abdomen, increased BS  Musculoskeletal: He exhibits no edema.  Skin: Skin is warm and dry. He is not  diaphoretic.          Assessment & Plan:   See Problem List for Assessment and Plan of chronic medical problems.

## 2015-09-07 NOTE — Progress Notes (Signed)
Pre visit review using our clinic review tool, if applicable. No additional management support is needed unless otherwise documented below in the visit note. 

## 2015-09-07 NOTE — Assessment & Plan Note (Signed)
His abdomen is not tender, so I doubt diverticulitis Concern for gallbladder disease, gastritis, peptic ulcer disease, pancreatic mass, kidney stone Check blood work Check ultrasound Depending on above results may need CT scan

## 2015-09-08 ENCOUNTER — Ambulatory Visit
Admission: RE | Admit: 2015-09-08 | Discharge: 2015-09-08 | Disposition: A | Discharge status: Home / Self Care | Payer: Medicare Other | Source: Ambulatory Visit | Attending: Internal Medicine | Admitting: Internal Medicine

## 2015-09-08 DIAGNOSIS — R109 Unspecified abdominal pain: Secondary | ICD-10-CM

## 2015-09-08 DIAGNOSIS — R1032 Left lower quadrant pain: Secondary | ICD-10-CM | POA: Diagnosis not present

## 2015-09-08 LAB — URINE CULTURE

## 2015-09-09 ENCOUNTER — Other Ambulatory Visit: Payer: Self-pay | Admitting: Internal Medicine

## 2015-09-09 DIAGNOSIS — R109 Unspecified abdominal pain: Secondary | ICD-10-CM

## 2015-09-10 ENCOUNTER — Encounter: Payer: Self-pay | Admitting: Internal Medicine

## 2015-09-20 ENCOUNTER — Telehealth: Payer: Self-pay | Admitting: Emergency Medicine

## 2015-09-20 ENCOUNTER — Ambulatory Visit (INDEPENDENT_AMBULATORY_CARE_PROVIDER_SITE_OTHER)
Admission: RE | Admit: 2015-09-20 | Discharge: 2015-09-20 | Disposition: A | Discharge status: Home / Self Care | Payer: Medicare Other | Source: Ambulatory Visit | Attending: Internal Medicine | Admitting: Internal Medicine

## 2015-09-20 DIAGNOSIS — R109 Unspecified abdominal pain: Secondary | ICD-10-CM

## 2015-09-20 DIAGNOSIS — K5730 Diverticulosis of large intestine without perforation or abscess without bleeding: Secondary | ICD-10-CM | POA: Diagnosis not present

## 2015-09-20 MED ORDER — IOPAMIDOL (ISOVUE-300) INJECTION 61%
100.0000 mL | Freq: Once | INTRAVENOUS | Status: AC | PRN
  Administered 2015-09-20: 100 mL via INTRAVENOUS

## 2015-09-20 NOTE — Telephone Encounter (Signed)
Imaging called to informed that CT results were back.

## 2015-09-21 ENCOUNTER — Telehealth: Payer: Self-pay | Admitting: Gastroenterology

## 2015-09-21 ENCOUNTER — Other Ambulatory Visit: Payer: Self-pay | Admitting: Internal Medicine

## 2015-09-21 DIAGNOSIS — K8689 Other specified diseases of pancreas: Secondary | ICD-10-CM

## 2015-09-21 NOTE — Telephone Encounter (Signed)
Scheduled with Alonza Bogus, PA on 09/30/15 at 1:30 PM. Patient needs to be called. Note to Dr. Ardis Hughs about EUS.

## 2015-09-21 NOTE — Telephone Encounter (Signed)
Can add on APP schedule within 7-10 days. EUS should be tenatively scheduled with Dr. Ardis Hughs.

## 2015-09-21 NOTE — Telephone Encounter (Signed)
Patient is aware of appointment °

## 2015-09-21 NOTE — Telephone Encounter (Signed)
Spoke to pt about ct scan. Urgent GI referral ordered.

## 2015-09-22 ENCOUNTER — Other Ambulatory Visit: Payer: Self-pay

## 2015-09-22 ENCOUNTER — Encounter (HOSPITAL_COMMUNITY): Payer: Self-pay | Admitting: *Deleted

## 2015-09-22 DIAGNOSIS — K8689 Other specified diseases of pancreas: Secondary | ICD-10-CM

## 2015-09-22 NOTE — Telephone Encounter (Signed)
Patty, Please cancel my 7:30 colonoscopy at [facility] him/her then next week.  Please apologize but Mr. Yokley situation takes priority here.   He needs EUS, radial +/- linear July 6th Thursday).  Can cancel the extender appt and instead start working on referral to medical oncology.  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   Lastly, can he have CA 19-9 checked prior to the EUS.  Thanks  Norberto Sorenson, [place]

## 2015-09-22 NOTE — Telephone Encounter (Signed)
EUS scheduled, pt instructed and medications reviewed.  Patient instructions in My Chart.  Patient to call with any questions or concerns.  

## 2015-09-22 NOTE — Telephone Encounter (Signed)
Referral to Med onc made, EUS scheduled and lab for CA 19-9 in EPIC, Also jessica Zehr appt cx, left message with family member to have the pt return call for details.

## 2015-09-25 DIAGNOSIS — C259 Malignant neoplasm of pancreas, unspecified: Secondary | ICD-10-CM

## 2015-09-25 HISTORY — DX: Malignant neoplasm of pancreas, unspecified: C25.9

## 2015-09-27 ENCOUNTER — Encounter: Payer: Self-pay | Admitting: Internal Medicine

## 2015-09-27 ENCOUNTER — Other Ambulatory Visit: Payer: Self-pay | Admitting: *Deleted

## 2015-09-27 MED ORDER — ONETOUCH DELICA LANCETS 33G MISC: Status: AC

## 2015-09-27 NOTE — Telephone Encounter (Signed)
Duplicate request already sent l[facility]ets to [facility].../lmb

## 2015-09-29 ENCOUNTER — Encounter (HOSPITAL_COMMUNITY): Payer: Self-pay | Admitting: Anesthesiology

## 2015-09-29 NOTE — Anesthesia Preprocedure Evaluation (Signed)
Anesthesia Evaluation  Patient identified by MRN, date of birth, ID band Patient awake    Reviewed: Allergy & Precautions, NPO status , Patient's Chart, lab work & pertinent test results  History of Anesthesia Complications (+) history of anesthetic complications  Airway Mallampati: II  TM Distance: >3 FB Neck ROM: Full    Dental no notable dental hx.    Pulmonary former smoker,    Pulmonary exam normal breath sounds clear to auscultation       Cardiovascular hypertension, Pt. on medications Normal cardiovascular exam Rhythm:Regular Rate:Normal     Neuro/Psych negative neurological ROS  negative psych ROS   GI/Hepatic Neg liver ROS, GERD  ,  Endo/Other  diabetes, Type 2  Renal/GU negative Renal ROS  negative genitourinary   Musculoskeletal  (+) Arthritis ,   Abdominal   Peds negative pediatric ROS (+)  Hematology negative hematology ROS (+)   Anesthesia Other Findings   Reproductive/Obstetrics negative OB ROS                             Anesthesia Physical Anesthesia Plan  ASA: II  Anesthesia Plan: MAC   Post-op Pain Management:    Induction: Intravenous  Airway Management Planned: Natural Airway  Additional Equipment:   Intra-op Plan:   Post-operative Plan:   Informed Consent: I have reviewed the patients History and Physical, chart, labs and discussed the procedure including the risks, benefits and alternatives for the proposed anesthesia with the patient or authorized representative who has indicated his/her understanding and acceptance.   Dental advisory given  Plan Discussed with: CRNA  Anesthesia Plan Comments:         Anesthesia Quick Evaluation

## 2015-09-30 ENCOUNTER — Encounter (HOSPITAL_COMMUNITY): Payer: Self-pay

## 2015-09-30 ENCOUNTER — Ambulatory Visit: Payer: Medicare Other | Admitting: Gastroenterology

## 2015-09-30 ENCOUNTER — Ambulatory Visit (HOSPITAL_COMMUNITY): Payer: Medicare Other | Admitting: Anesthesiology

## 2015-09-30 ENCOUNTER — Other Ambulatory Visit: Payer: Self-pay | Admitting: *Deleted

## 2015-09-30 ENCOUNTER — Ambulatory Visit (HOSPITAL_COMMUNITY)
Admission: RE | Admit: 2015-09-30 | Discharge: 2015-09-30 | Disposition: A | Discharge status: Home / Self Care | Payer: Medicare Other | Source: Ambulatory Visit | Attending: Gastroenterology | Admitting: Gastroenterology

## 2015-09-30 ENCOUNTER — Telehealth: Payer: Self-pay | Admitting: Gastroenterology

## 2015-09-30 ENCOUNTER — Telehealth: Payer: Self-pay

## 2015-09-30 ENCOUNTER — Other Ambulatory Visit: Payer: Self-pay | Admitting: Internal Medicine

## 2015-09-30 ENCOUNTER — Encounter (HOSPITAL_COMMUNITY)
Admission: RE | Disposition: A | Discharge status: Home / Self Care | Payer: Self-pay | Source: Ambulatory Visit | Attending: Gastroenterology

## 2015-09-30 DIAGNOSIS — E119 Type 2 diabetes mellitus without complications: Secondary | ICD-10-CM | POA: Insufficient documentation

## 2015-09-30 DIAGNOSIS — Z8546 Personal history of malignant neoplasm of prostate: Secondary | ICD-10-CM | POA: Diagnosis not present

## 2015-09-30 DIAGNOSIS — C251 Malignant neoplasm of body of pancreas: Secondary | ICD-10-CM | POA: Insufficient documentation

## 2015-09-30 DIAGNOSIS — R933 Abnormal findings on diagnostic imaging of other parts of digestive tract: Secondary | ICD-10-CM

## 2015-09-30 DIAGNOSIS — I10 Essential (primary) hypertension: Secondary | ICD-10-CM | POA: Insufficient documentation

## 2015-09-30 DIAGNOSIS — Z7982 Long term (current) use of aspirin: Secondary | ICD-10-CM | POA: Insufficient documentation

## 2015-09-30 DIAGNOSIS — E785 Hyperlipidemia, unspecified: Secondary | ICD-10-CM | POA: Insufficient documentation

## 2015-09-30 DIAGNOSIS — Z79899 Other long term (current) drug therapy: Secondary | ICD-10-CM | POA: Diagnosis not present

## 2015-09-30 DIAGNOSIS — Z9049 Acquired absence of other specified parts of digestive tract: Secondary | ICD-10-CM | POA: Diagnosis not present

## 2015-09-30 DIAGNOSIS — K869 Disease of pancreas, unspecified: Secondary | ICD-10-CM | POA: Diagnosis present

## 2015-09-30 DIAGNOSIS — H3530 Unspecified macular degeneration: Secondary | ICD-10-CM | POA: Insufficient documentation

## 2015-09-30 DIAGNOSIS — H409 Unspecified glaucoma: Secondary | ICD-10-CM | POA: Diagnosis not present

## 2015-09-30 DIAGNOSIS — Z87891 Personal history of nicotine dependence: Secondary | ICD-10-CM | POA: Diagnosis not present

## 2015-09-30 DIAGNOSIS — K8689 Other specified diseases of pancreas: Secondary | ICD-10-CM | POA: Diagnosis not present

## 2015-09-30 DIAGNOSIS — Z96649 Presence of unspecified artificial hip joint: Secondary | ICD-10-CM | POA: Diagnosis not present

## 2015-09-30 DIAGNOSIS — C259 Malignant neoplasm of pancreas, unspecified: Secondary | ICD-10-CM

## 2015-09-30 DIAGNOSIS — K222 Esophageal obstruction: Secondary | ICD-10-CM | POA: Diagnosis not present

## 2015-09-30 HISTORY — PX: ESOPHAGOGASTRODUODENOSCOPY (EGD) WITH PROPOFOL: SHX5813

## 2015-09-30 HISTORY — DX: Other specified postprocedural states: Z98.890

## 2015-09-30 HISTORY — DX: Other specified postprocedural states: R11.2

## 2015-09-30 HISTORY — DX: Other complications of anesthesia, initial encounter: T88.59XA

## 2015-09-30 HISTORY — PX: EUS: SHX5427

## 2015-09-30 HISTORY — PX: FINE NEEDLE ASPIRATION: SHX5430

## 2015-09-30 HISTORY — DX: Adverse effect of unspecified anesthetic, initial encounter: T41.45XA

## 2015-09-30 LAB — GLUCOSE, CAPILLARY: GLUCOSE-CAPILLARY: 152 mg/dL — AB (ref 65–99)

## 2015-09-30 SURGERY — UPPER ENDOSCOPIC ULTRASOUND (EUS) RADIAL
Anesthesia: Monitor Anesthesia Care

## 2015-09-30 MED ORDER — PROPOFOL 10 MG/ML IV BOLUS
INTRAVENOUS | Status: DC | PRN
  Administered 2015-09-30: 30 mg via INTRAVENOUS
  Administered 2015-09-30 (×2): 20 mg via INTRAVENOUS

## 2015-09-30 MED ORDER — LIDOCAINE HCL (CARDIAC) 20 MG/ML IV SOLN
INTRAVENOUS | Status: DC | PRN
  Administered 2015-09-30: 25 mg via INTRATRACHEAL

## 2015-09-30 MED ORDER — SODIUM CHLORIDE 0.9 % IV SOLN: INTRAVENOUS | Status: DC

## 2015-09-30 MED ORDER — PROPOFOL 500 MG/50ML IV EMUL
INTRAVENOUS | Status: DC | PRN
  Administered 2015-09-30: 140 ug/kg/min via INTRAVENOUS

## 2015-09-30 MED ORDER — PROPOFOL 10 MG/ML IV BOLUS
INTRAVENOUS | Status: AC
  Filled 2015-09-30: qty 60

## 2015-09-30 MED ORDER — LACTATED RINGERS IV SOLN
INTRAVENOUS | Status: DC
  Administered 2015-09-30: 1000 mL via INTRAVENOUS

## 2015-09-30 MED ORDER — LIDOCAINE HCL (CARDIAC) 20 MG/ML IV SOLN
INTRAVENOUS | Status: AC
  Filled 2015-09-30: qty 5

## 2015-09-30 NOTE — Telephone Encounter (Signed)
-----   Message from Milus Banister, MD sent at 09/30/2015  8:29 AM EDT ----- All, Just completed EUS, FNA.  See full report in EPIC.  This looks like adenocarcinoma of the pancreas.  Meeyah Ovitt, He needs medical oncology referral and also referral to [name] at [facility].  He needs staging CT scan of the chest as well.  Thanks  Manuela Schwartz, Can you add him to the the next GI cancer conference.  thanks

## 2015-09-30 NOTE — Telephone Encounter (Signed)
You have been scheduled for a CT scan of the Chest at [facility] ([address]---this is in the same building as Press [facility]).   You are scheduled on 10/06/15 at 3:30 pm. You should arrive 15 minutes prior to your appointment time for registration. Please follow the written instructions below on the day of your exam:  WARNING: IF YOU ARE ALLERGIC TO IODINE/X-RAY DYE, PLEASE NOTIFY RADIOLOGY IMMEDIATELY AT [phone]! YOU WILL BE GIVEN A 13 HOUR PREMEDICATION PREP.  1) Do not eat or drink anything after 130 pm (2 hours prior to your test)  You may take any medications as prescribed with a small amount of water except for the following: Metformin, Glucophage, Glucovance, Avandamet, Riomet, Fortamet, Actoplus Met, Janumet, Glumetza or Metaglip. The above medications must be held the day of the exam AND 48 hours after the exam.  The purpose of you drinking the oral contrast is to aid in the visualization of your intestinal tract. The contrast solution may cause some diarrhea. Before your exam is started, you will be given a small amount of fluid to drink. Depending on your individual set of symptoms, you may also receive an intravenous injection of x-ray contrast/dye. Plan on being at [facility] for 30 minutes or longer, depending on the type of exam you are having performed.  This test typically takes 30-45 minutes to complete.  If you have any questions regarding your exam or if you need to reschedule, you may call the CT department at [phone] between the hours of 8:00 am and 5:00 pm, Monday-Friday.  ________________________________________________________________________ [name] have been scheduled for an appointment with Dr Barry Dienes at [facility]. Your appointment is on 10/11/15 at 1130 am. Please arrive at 11 am for registration. Make certain to bring a list of current medications, including any over the counter medications or vitamins. Also bring your  co-pay if you have one as well as your insurance cards. [facility] is located at [address], [address]. Should you need to reschedule your appointment, please contact them at [phone].  Pt has been notified and will call with any questions

## 2015-09-30 NOTE — Telephone Encounter (Signed)
The pt has "severe" heart burn and having problems swallowing solids and liquids within an hour of getting home after the procedure this morning.  He ate soup and grilled cheese for lunch and the reflux worsened after eating.

## 2015-09-30 NOTE — Discharge Instructions (Signed)

## 2015-09-30 NOTE — Telephone Encounter (Signed)
See alternate note  

## 2015-09-30 NOTE — Interval H&P Note (Signed)
History and Physical Interval Note:  09/30/2015 7:13 AM  [name]  has presented today for surgery, with the diagnosis of pancreatic mass  The various methods of treatment have been discussed with the patient and family. After consideration of risks, benefits and other options for treatment, the patient has consented to  Procedure(s): UPPER ENDOSCOPIC ULTRASOUND (EUS) RADIAL (N/A) as a surgical intervention .  The patient's history has been reviewed, patient examined, no change in status, stable for surgery.  I have reviewed the patient's chart and labs.  Questions were answered to the patient's satisfaction.     Milus Banister

## 2015-09-30 NOTE — Op Note (Signed)
[facility] Patient Name: [name] Procedure Date: 09/30/2015 MRN: [id number] Attending MD: Milus Banister , MD Date of Birth: 1935-05-03 CSN: [id number] Age: 80 Admit Type: Outpatient Procedure:                Upper EUS Indications:              Suspected mass in pancreas on CT scan Providers:                Milus Banister, MD, Hilma Favors, RN, Elspeth Cho, Technician Referring MD:             Lucio Edward, MD Medicines:                Monitored Anesthesia Care Complications:            No immediate complications. Estimated blood loss:                            None. Estimated Blood Loss:     Estimated blood loss: none. Procedure:                Pre-Anesthesia Assessment:                           - Prior to the procedure, a History and Physical                            was performed, and patient medications and                            allergies were reviewed. The patient's tolerance of                            previous anesthesia was also reviewed. The risks                            and benefits of the procedure and the sedation                            options and risks were discussed with the patient.                            All questions were answered, and informed consent                            was obtained. Prior Anticoagulants: The patient has                            taken no previous anticoagulant or antiplatelet                            agents. ASA Grade Assessment: II - A patient with  mild systemic disease. After reviewing the risks                            and benefits, the patient was deemed in                            satisfactory condition to undergo the procedure.                           After obtaining informed consent, the endoscope was                            passed under direct vision. Throughout the                            procedure, the patient's  blood pressure, pulse, and                            oxygen saturations were monitored continuously. The                            [id number]) scope was introduced through                            the mouth, and advanced to the second part of                            duodenum. The upper EUS was accomplished without                            difficulty. The patient tolerated the procedure                            well. Scope In: Scope Out: Findings:      Endoscopic Finding :      1. One mild benign-appearing (thick Schatzki's ring vs focal peptic       stricture) intrinsic stenosis was found at the gastroesophageal       junction. This measured 1 cm (inner diameter) and was traversed.      2. Upper GI tract was otherwise normal.      Endosonographic Finding :      1. An irregular mass was identified in the pancreatic body. The mass was       hypoechoic. The mass measured 25 mm in maximal cross-sectional diameter.       The endosonographic borders were poorly-defined. The mass directly abuts       the SMA for about 1.5cm and also abuts the Celiac trunk for a short       distance. The remainder of the pancreatic parenchyma was normal.       Upstream from the mass, the main pancreatic duct was dilated, a       tortuous/ectatic duct and a maximum duct diameter of 5 mm. Fine needle       aspiration for cytology was performed. Color Doppler imaging was       utilized prior to needle puncture to confirm a lack of  significant       vascular structures within the needle path. Three passes were made with       the 25 gauge needle using a transgastric approach. Some passes were made       with a stylet. A cytotechnologist was present to evaluate the adequacy       of the specimen.      2. No peripancreatic adenopathy      3. CBD was normal, non-dilated      4. Gallbladder was normal      5. Limited views of liver, spleen, portal and splenic vessels were all        normal. Impression:               - Schatzki's ring (vs focal peptic stricture) at                            the GE junction                           - 2.5cm mass in the body of pancreas causing                            upstream dilation of the main pancreatic duct,                            directly abutting the SMA and celiac trunk.                            Preliminary cytology review from the mass was                            positive for malignancy (likely adenocarcinoma) Moderate Sedation:      N/A- Per Anesthesia Care Recommendation:           - Discharge patient to home (ambulatory).                           - Medical and surgical oncology referrals and will                            present his case at the next GI cancer conference                            (my office will arrange)                           - He will need staging CT scan of the chest (my                            office will arrange). Procedure Code(s):        --- Professional ---                           [phone], Esophagogastroduodenoscopy, flexible,  transoral; with transendoscopic ultrasound-guided                            intramural or transmural fine needle                            aspiration/biopsy(s), (includes endoscopic                            ultrasound examination limited to the esophagus,                            stomach or duodenum, and adjacent structures) Diagnosis Code(s):        --- Professional ---                           K22.2, Esophageal obstruction                           K86.89, Other specified diseases of pancreas                           R93.3, Abnormal findings on diagnostic imaging of                            other parts of digestive tract CPT copyright 2016 American Medical Association. All rights reserved. The codes documented in this report are preliminary and upon coder review may  be revised to meet current compliance  requirements. Milus Banister, MD 09/30/2015 8:29:43 AM This report has been signed electronically. Number of Addenda: 0

## 2015-09-30 NOTE — Anesthesia Postprocedure Evaluation (Signed)
Anesthesia Post Note  Patient: [name]  Procedure(s) Performed: Procedure(s) (LRB): UPPER ENDOSCOPIC ULTRASOUND (EUS) RADIAL (N/A)  Patient location during evaluation: PACU Anesthesia Type: MAC Level of consciousness: awake and alert Pain management: pain level controlled Vital Signs Assessment: post-procedure vital signs reviewed and stable Respiratory status: spontaneous breathing, nonlabored ventilation, respiratory function stable and patient connected to nasal cannula oxygen Cardiovascular status: stable and blood pressure returned to baseline Anesthetic complications: no    Last Vitals:  Filed Vitals:   09/30/15 0840 09/30/15 0850  BP: 101/54 133/58  Pulse: 69 71  Temp:    Resp: 18 17    Last Pain:  Filed Vitals:   09/30/15 0852  PainSc: 1                  Meredyth Hornung J

## 2015-09-30 NOTE — Telephone Encounter (Signed)
Rec'd fax refill on doxazosin. Per chart MD already approved today...[name]

## 2015-09-30 NOTE — Transfer of Care (Signed)
Immediate Anesthesia Transfer of Care Note  Patient: [name]  Procedure(s) Performed: Procedure(s): UPPER ENDOSCOPIC ULTRASOUND (EUS) RADIAL (N/A)  Patient Location: PACU and Endoscopy Unit  Anesthesia Type:MAC  Level of Consciousness: sedated and patient cooperative  Airway & Oxygen Therapy: Patient Spontanous Breathing and Patient connected to nasal cannula oxygen  Post-op Assessment: Report given to RN and Post -op Vital signs reviewed and stable  Post vital signs: Reviewed and stable  Last Vitals:  Filed Vitals:   09/30/15 0628  BP: 154/69  Pulse: 78  Temp: 36.8 C  Resp: 13    Last Pain:  Filed Vitals:   09/30/15 0636  PainSc: 1          Complications: No apparent anesthesia complications

## 2015-09-30 NOTE — H&P (View-Only) (Signed)
Subjective:    Patient ID: [name], male    DOB: [birth year], 80 y.o.   MRN: [id number]  HPI He is here for an acute visit.   Stomach discomfort, Not true pain:  It started about two weeks ago, maybe longer.  It started as a slight ache and he thought it was something he ate.  It has been persistent.  Two days ago it was more painful and radiated to his back.  He thinks after he eats it is sometimes worse, but not always.  He has bloating after eating.  He stomach growls more.  He has taken gas-ex a couple of times - he is unsure if it has helped. He does have a history of diverticulitis resulting in a partial colectomy. He does have some chronic constipation, but this is not new. He denies any blood in the stool. He denies nausea, chest pain, fever, changes in his appetite or weight. He has not been experiencing any urinary symptoms.  Medications and allergies reviewed with patient and updated if appropriate.  Patient Active Problem List   Diagnosis Date Noted  . Glaucoma 06/17/2015  . Macular degeneration 06/17/2015  . Dysphagia, pharyngoesophageal phase 09/23/2014  . Hypokalemia 03/17/2014  . ANGIOEDEMA 12/22/2009  . PROSTATE CANCER, HX OF 12/22/2009  . Hyperlipidemia 04/13/2009  . DIVERTICULOSIS, COLON 10/28/2007  . COLONIC POLYPS, HX OF 10/28/2007  . Diabetes type 2, controlled ([place]) 11/22/2006  . Essential hypertension 09/06/2006    Current Outpatient Prescriptions on File Prior to Visit  Medication Sig Dispense Refill  . amLODipine (NORVASC) 10 MG tablet TAKE 1 TABLET ONCE A DAY 90 tablet 3  . amoxicillin (AMOXIL) 500 MG capsule Take 1 capsule (500 mg total) by mouth 3 (three) times daily. 30 capsule 0  . aspirin 81 MG tablet Take 1 tablet (81 mg total) by mouth daily. 30 tablet   . calcium carbonate (OS-CAL) 600 MG TABS Take 600 mg by mouth 2 (two) times daily with a meal.    . cholecalciferol (VITAMIN D) 1000 UNITS tablet Take 2,000 Units by mouth daily.    Marland Kitchen  doxazosin (CARDURA) 1 MG tablet TAKE 1 TABLET BY MOUTH EVERY DAY 90 tablet 3  . glucose blood (ONETOUCH VERIO) test strip Check blood sugar once daily 100 each 12  . hydrochlorothiazide (MICROZIDE) 12.5 MG capsule Take 1 capsule (12.5 mg total) by mouth daily. 90 capsule 3  . latanoprost (XALATAN) 0.005 % ophthalmic solution Place 1 drop into both eyes at bedtime.  1  . multivitamin-lutein (OCUVITE-LUTEIN) CAPS Take 1 capsule by mouth daily.    Glory Rosebush DELICA LANCETS [id number] MISC Check blood sugar once daily 100 each 12  . ONETOUCH VERIO test strip CHECK BLOOD SUGAR ONCE DAILY. DX250.00 100 each 5  . pravastatin (PRAVACHOL) 20 MG tablet TAKE 1 TABLET AT BEDTIME 90 tablet 3   No current facility-administered medications on file prior to visit.    Past Medical History  Diagnosis Date  . Femoral bruit     R femoral  . Macular degeneration     glaucoma suspect  . Squamous papilloma     of esophagus  . Hyperlipidemia   . Hypertension   . Prostate cancer [facility])     prostate ; Dr Junious Silk  . GERD with stricture     PMH of  . Diabetes mellitus     diet controlled  . Arthritis     R hip and shoulder    Past Surgical History  Procedure Laterality Date  . Esophageal dilation  2002    Dr  Lucio Edward  . Ankle fusion  2004  . Partial colectomy      perforated diverticulitis  . Rotator cuff repair  2006  . Total hip arthroplasty  2012  . Hernia repair      bil inguinal hernias  . Prostate biopsy  03/29/2012    Procedure: BIOPSY TRANSRECTAL ULTRASONIC PROSTATE (TUBP);  Surgeon: Fredricka Bonine, MD;  Location: [facility];  Service: Urology;  Laterality: N/A;  . Colonoscopy  05/2012    Social History   Social History  . Marital Status: Married    Spouse Name: N/A  . Number of Children: N/A  . Years of Education: N/A   Social History Main Topics  . Smoking status: Former Smoker    Types: Cigarettes    Quit date: 03/27/1989  . Smokeless tobacco: None      Comment: smoked [place], up to 1 ppd  . Alcohol Use: No  . Drug Use: No  . Sexual Activity: Not Asked   Other Topics Concern  . None   Social History Narrative    Family History  Problem Relation Age of Onset  . Kidney disease Father     ? Rheumatic fever as child  . Hypertension Father   . Heart attack Paternal Uncle     in 89s  . COPD Paternal Uncle     due to exposures in [facility]  . Cancer Neg Hx   . Diabetes Neg Hx   . Stroke Neg Hx   . Colon cancer Neg Hx   . Kidney disease Paternal Aunt     ? Rheumatic fever as child    Review of Systems  Constitutional: Negative for fever, chills, appetite change and unexpected weight change.  Respiratory: Negative for cough, shortness of breath and wheezing.   Cardiovascular: Negative for chest pain, palpitations and leg swelling.  Gastrointestinal: Positive for abdominal pain, constipation and abdominal distention (bloating). Negative for nausea and blood in stool.       Bouts of gerd - intermittent for years  Genitourinary: Negative for dysuria, hematuria and difficulty urinating.  Neurological: Negative for light-headedness and headaches.       Objective:   Filed Vitals:   09/07/15 1436  BP: 148/84  Pulse: 89  Temp: 98.5 F (36.9 C)  Resp: 16   Filed Weights   09/07/15 1436  Weight: 131 lb (59.421 kg)   Body mass index is 18.8 kg/(m^2).   Physical Exam  Constitutional: He appears well-developed and well-nourished. No distress.  HENT:  Head: Normocephalic and atraumatic.  Cardiovascular: Normal rate, regular rhythm and normal heart sounds.   No murmur heard. Pulmonary/Chest: Effort normal and breath sounds normal. No respiratory distress. He has no wheezes. He has no rales.  Abdominal: Soft. He exhibits no distension and no mass. There is no tenderness. There is no rebound and no guarding.  Scar mid lower abdomen, increased BS  Musculoskeletal: He exhibits no edema.  Skin: Skin is warm and dry. He is not  diaphoretic.          Assessment & Plan:   See Problem List for Assessment and Plan of chronic medical problems.

## 2015-10-01 LAB — CANCER ANTIGEN 19-9: CA 19-9: 370 U/mL — ABNORMAL HIGH (ref 0–35)

## 2015-10-01 NOTE — Telephone Encounter (Signed)
He feels much better this morning.

## 2015-10-04 ENCOUNTER — Other Ambulatory Visit: Payer: Self-pay | Admitting: Internal Medicine

## 2015-10-06 ENCOUNTER — Telehealth: Payer: Self-pay | Admitting: *Deleted

## 2015-10-06 ENCOUNTER — Ambulatory Visit (INDEPENDENT_AMBULATORY_CARE_PROVIDER_SITE_OTHER)
Admission: RE | Admit: 2015-10-06 | Discharge: 2015-10-06 | Disposition: A | Discharge status: Home / Self Care | Payer: Medicare Other | Source: Ambulatory Visit | Attending: Gastroenterology | Admitting: Gastroenterology

## 2015-10-06 ENCOUNTER — Other Ambulatory Visit: Payer: Self-pay

## 2015-10-06 DIAGNOSIS — C259 Malignant neoplasm of pancreas, unspecified: Secondary | ICD-10-CM

## 2015-10-06 DIAGNOSIS — R911 Solitary pulmonary nodule: Secondary | ICD-10-CM | POA: Diagnosis not present

## 2015-10-06 MED ORDER — IOPAMIDOL (ISOVUE-300) INJECTION 61%
100.0000 mL | Freq: Once | INTRAVENOUS | Status: AC | PRN
  Administered 2015-10-06: 100 mL via INTRAVENOUS

## 2015-10-06 NOTE — Telephone Encounter (Signed)
Oncology Nurse Navigator Documentation  Oncology Nurse Navigator Flowsheets 10/06/2015  Navigator Location [facility]  Navigator Encounter Type Telephone  Telephone Outgoing Call  Abnormal Finding Date 09/20/2015  Confirmed Diagnosis Date 09/30/2015  Interventions Coordination of Care--CT abdomen (pancreas protocol needed)  Coordination of Care Appts--contacted Christian Mate, RN with Dr. Wilford Sports check on precert and attempt to add to today's CT chest  Time Spent with Patient 15  Case discussion today in GI Conference: Tumor looks borderline resectable. Needs CT abdomen pancreas protocol. Currently appears that tumor abuts, but does not invade the splenic artery. Will require neoadjuvant chemotherapy. Can add SBRT later as indicated. Called and left VM for patient with the new plan for his scan today and instructed him to be NPO now until he hears from [place]. Informed him he will not need oral contrast. Will just be drinking water for the scan of the abdomen today.

## 2015-10-07 ENCOUNTER — Ambulatory Visit (HOSPITAL_BASED_OUTPATIENT_CLINIC_OR_DEPARTMENT_OTHER): Payer: Medicare Other | Admitting: Hematology

## 2015-10-07 ENCOUNTER — Encounter: Payer: Self-pay | Admitting: Hematology

## 2015-10-07 ENCOUNTER — Encounter: Payer: Self-pay | Admitting: *Deleted

## 2015-10-07 ENCOUNTER — Encounter: Payer: Self-pay | Admitting: Internal Medicine

## 2015-10-07 ENCOUNTER — Other Ambulatory Visit: Payer: Self-pay | Admitting: *Deleted

## 2015-10-07 ENCOUNTER — Ambulatory Visit (HOSPITAL_BASED_OUTPATIENT_CLINIC_OR_DEPARTMENT_OTHER): Payer: Medicare Other

## 2015-10-07 VITALS — BP 150/72 | HR 87 | Temp 98.4°F | Resp 18 | Ht 70.0 in | Wt 133.0 lb

## 2015-10-07 DIAGNOSIS — G893 Neoplasm related pain (acute) (chronic): Secondary | ICD-10-CM | POA: Diagnosis not present

## 2015-10-07 DIAGNOSIS — C61 Malignant neoplasm of prostate: Secondary | ICD-10-CM

## 2015-10-07 DIAGNOSIS — C251 Malignant neoplasm of body of pancreas: Secondary | ICD-10-CM

## 2015-10-07 DIAGNOSIS — E119 Type 2 diabetes mellitus without complications: Secondary | ICD-10-CM | POA: Diagnosis not present

## 2015-10-07 DIAGNOSIS — C259 Malignant neoplasm of pancreas, unspecified: Secondary | ICD-10-CM | POA: Diagnosis not present

## 2015-10-07 LAB — COMPREHENSIVE METABOLIC PANEL
ALBUMIN: 3.8 g/dL (ref 3.5–5.0)
ALT: 19 U/L (ref 0–55)
AST: 25 U/L (ref 5–34)
Alkaline Phosphatase: 78 U/L (ref 40–150)
Anion Gap: 11 mEq/L (ref 3–11)
BUN: 22.9 mg/dL (ref 7.0–26.0)
CO2: 28 meq/L (ref 22–29)
Calcium: 9.5 mg/dL (ref 8.4–10.4)
Chloride: 102 mEq/L (ref 98–109)
Creatinine: 1.3 mg/dL (ref 0.7–1.3)
EGFR: 61 mL/min/{1.73_m2} — AB (ref >90)
GLUCOSE: 137 mg/dL (ref 70–140)
Potassium: 3.6 mEq/L (ref 3.5–5.1)
SODIUM: 141 meq/L (ref 136–145)
Total Bilirubin: 0.47 mg/dL (ref 0.20–1.20)
Total Protein: 7.6 g/dL (ref 6.4–8.3)

## 2015-10-07 LAB — CBC WITH DIFFERENTIAL/PLATELET
BASO%: 0.6 % (ref 0.0–2.0)
Basophils Absolute: 0 10*3/uL (ref 0.0–0.1)
EOS ABS: 0.1 10*3/uL (ref 0.0–0.5)
EOS%: 1.3 % (ref 0.0–7.0)
HCT: 42.6 % (ref 38.4–49.9)
HEMOGLOBIN: 14.4 g/dL (ref 13.0–17.1)
LYMPH%: 17.7 % (ref 14.0–49.0)
MCH: 27.3 pg (ref 27.2–33.4)
MCHC: 33.8 g/dL (ref 32.0–36.0)
MCV: 80.7 fL (ref 79.3–98.0)
MONO#: 0.8 10*3/uL (ref 0.1–0.9)
MONO%: 10.7 % (ref 0.0–14.0)
NEUT%: 69.7 % (ref 39.0–75.0)
NEUTROS ABS: 4.9 10*3/uL (ref 1.5–6.5)
Platelets: 186 10*3/uL (ref 140–400)
RBC: 5.28 10*6/uL (ref 4.20–5.82)
RDW: 14.2 % (ref 11.0–14.6)
WBC: 7 10*3/uL (ref 4.0–10.3)
lymph#: 1.2 10*3/uL (ref 0.9–3.3)

## 2015-10-07 MED ORDER — METFORMIN HCL 500 MG PO TABS: 500.0000 mg | ORAL_TABLET | Freq: Two times a day (BID) | ORAL | Status: AC

## 2015-10-07 MED ORDER — ONDANSETRON HCL 8 MG PO TABS: 8.0000 mg | ORAL_TABLET | Freq: Two times a day (BID) | ORAL | Status: DC | PRN

## 2015-10-07 MED ORDER — PROCHLORPERAZINE MALEATE 10 MG PO TABS: 10.0000 mg | ORAL_TABLET | Freq: Four times a day (QID) | ORAL | Status: DC | PRN

## 2015-10-07 NOTE — Patient Instructions (Signed)
  Care Plan Summary- 10/07/2015 Name:  [name]     DOB: 1935/08/14 Your Medical Team:  Medical Oncologist:  Dr. Truitt Merle Radiation Oncologist:    Surgeon:   Dr. [name] Type of Cancer: Adenocarcinoma of Pancreas  Stage/Grade: Stage II *Exact staging of your cancer is based on size of the tumor, depth of invasion, involvement of lymph nodes or not, and whether or not the cancer has spread beyond the primary site.   Recommendations: Based on information available as of today's consult. Recommendations may change depending on the results of further tests or exams. 1) Chemotherapy weekly Gemzar/Abraxane (3 on/1 off) x 2 months and re scan 2) Surgery per Dr. Barry Dienes if able to resect tumor  3) Keep bowels soft with Colace (OTC) 1-2/day & call if pain worsens or not controlled 4) Still with treatment/surgery can be up to 50% cancer can recur-depends upon how aggressive the cancer tumor is ____________________________________________________________________________ Next Steps: 1) Chemo teaching class and nutrition referral. Labs today. 2) See Dr. Barry Dienes on 7/17 as scheduled-will check into getting port placed next week 3) Contact your PCP to discuss medication for your diabetes  Questions? Merceda Elks, RN, BSN at [phone]. [name] is your Oncology Nurse Navigator and is available to assist you while you're receiving your medical care at [facility].

## 2015-10-07 NOTE — Progress Notes (Signed)
[place]  [phone] [phone]  Clinic New Consult Note   Patient Care Team: Binnie Rail, MD as PCP - General (Internal Medicine) 10/07/2015  Referring physician: Dr. Ardis Hughs  CHIEF COMPLAINTS/PURPOSE OF CONSULTATION:  Newly diagnosed pancreatic cancer  Oncology History   Primary pancreatic cancer [facility])   Staging form: Pancreas, AJCC 7th Edition     Clinical stage from 09/30/2015: Stage IIB (T2, N1, M0) - Signed by Truitt Merle, MD on 10/07/2015       Primary pancreatic cancer ([place])   09/30/2015 Initial Diagnosis Primary pancreatic cancer ([place])   09/30/2015 Initial Biopsy  endoscopic pancreatic body mass fine-needle aspiration show malignant cells consistent with adenocarcinoma.   09/30/2015 Procedure  EUS showe a 2.5 cm pancreatic mass causing a stream the dictation of the main pancreatic duct, and directly abutting the SMA an   10/06/2015 Imaging  CT chest, abdomen and  pelvis showed a  4 cm mass in the body of  pancreas, tumor abut the anterior wall of the SMA, a 1.4cm  he pedal duoligament lymph nodes is suspicious, 50mm RUL lung nodule is nonspecific, no other distant mets    HISTORY OF PRESENTING ILLNESS:  [name] 80 y.o. male is here because of His recently diagnosed pancreatic cancer. He presents to the clinic with his wife today.  He has been having abdominal pain for the past one month, worst in the past few weeks, radiates to back, 3-5/10, intermittent, worse with empty stomack, no nausea, bloating, BM is slightly constipated, once a daily. No melana or hematochezia. He has good appetie and eats well, mild fatigue, he is aboe to do his daily activities without difficulty, such as morning his long. He also exercise regularly, bikes a few miles a day. No significant weight loss recently.  He was seen by his primary care physician, and a CT chest and abdomen was done on 09/20/2015 which showed a 2.7 x 2.5 cm mass in the pancreatic body with  associated pancreatic duct dilatation. He was referred to see GI Dr. Ardis Hughs, and underwent EGD and EUS, backward a mass fine-needle biopsy, which showed adenocarcinoma.  He was diagnosed with prosttae can 5 years ago, has been monitored, no treatment, he has low PSA level.  He is diabetic, he is to be on metformin, has been off for a while because his sugar has been relatively controlled with diet alone. His recent HbA1c was some 7.9 in 05/2015.    MEDICAL HISTORY:  Past Medical History  Diagnosis Date  . Femoral bruit     R femoral  . Macular degeneration     glaucoma suspect  . Squamous papilloma     of esophagus  . Hyperlipidemia   . Hypertension   . GERD with stricture     PMH of  . Diabetes mellitus     diet controlled  . Arthritis     R hip and shoulder  . Complication of anesthesia     Local anesthetic used in office for Prostate biopsy" reaction Tachycardia,nausea, hallucinations, Blood pressure elevated"- No problems once done at hospital with anesthesia" Thinks Ciprofloxacin was the injection"   . Prostate cancer [facility])     prostate ; Dr Wyvonnia Lora to be monitored x 5 yrs now.  Marland Kitchen PONV (postoperative nausea and vomiting)     SURGICAL HISTORY: Past Surgical History  Procedure Laterality Date  . Esophageal dilation  2002    Dr  Lucio Edward  . Ankle fusion  2004  . Partial colectomy      perforated diverticulitis  . Rotator cuff repair  2006  . Total hip arthroplasty Left 2012  . Hernia repair      bil inguinal hernias  . Prostate biopsy  03/29/2012    Procedure: BIOPSY TRANSRECTAL ULTRASONIC PROSTATE (TUBP);  Surgeon: Fredricka Bonine, MD;  [facility];  Service: Urology;  Laterality: N/A;  . Colonoscopy  05/2012  . Eus N/A 09/30/2015    Procedure: UPPER ENDOSCOPIC ULTRASOUND (EUS) RADIAL;  Surgeon: Milus Banister, MD;  Location: [facility];  Service: Endoscopy;  Laterality: N/A;  . Fine needle aspiration N/A 09/30/2015     Procedure: FINE NEEDLE ASPIRATION (FNA) LINEAR;  Surgeon: Milus Banister, MD;  Location: [facility];  Service: Endoscopy;  Laterality: N/A;  . Esophagogastroduodenoscopy (egd) with propofol N/A 09/30/2015    Procedure: ESOPHAGOGASTRODUODENOSCOPY (EGD) WITH PROPOFOL;  Surgeon: Milus Banister, MD;  Location: [facility];  Service: Endoscopy;  Laterality: N/A;    SOCIAL HISTORY: Social History   Social History  . Marital Status: Married    Spouse Name: Deloris  . Number of Children: 1  . Years of Education: N/A   Occupational History  . Retired     Ecologist in [place]  . Smoking status: Former Smoker -- 0.50 packs/day for 40 years    Types: Cigarettes    Quit date: 03/27/1989  . Smokeless tobacco: Not on file     Comment: smoked [place], up to 1 ppd  . Alcohol Use: No  . Drug Use: No  . Sexual Activity: Not on file   Other Topics Concern  . 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 He and his wife moved to [place] to be closer to his son.  He has one son and one Granddaughter who is 38.  FAMILY HISTORY: Family History  Problem Relation Age of Onset  . Kidney disease Father     ? Rheumatic fever as child  . Hypertension Father   . Heart attack Paternal Uncle     in 107s  . COPD Paternal Uncle     due to exposures in [facility]  . Cancer Neg Hx   . Diabetes Neg Hx   . Stroke Neg Hx   . Colon cancer Neg Hx   . Kidney disease Paternal Aunt     ? Rheumatic fever as child    ALLERGIES:  is allergic to ace inhibitors; angiotensin receptor blockers; and ciprofloxacin.  MEDICATIONS:  Current Outpatient Prescriptions  Medication Sig Dispense Refill  . amLODipine (NORVASC) 10 MG  tablet TAKE 1 TABLET ONCE A DAY 90 tablet 3  . aspirin 81 MG tablet Take 1 tablet (81 mg total) by mouth daily. 30 tablet   . doxazosin (CARDURA) 1 MG tablet TAKE 1 TABLET BY MOUTH EVERY DAY 90 tablet 2  . glucose blood (ONETOUCH VERIO) test strip Check blood sugar once daily 100 each 12  . hydrochlorothiazide (MICROZIDE) 12.5 MG capsule Take 1 capsule (12.5 mg total) by mouth daily. 90 capsule 3  . latanoprost (XALATAN) 0.005 % ophthalmic solution Place 1 drop  into both eyes at bedtime.  1  . multivitamin-lutein (OCUVITE-LUTEIN) CAPS Take 1 capsule by mouth daily.    . naproxen sodium (ANAPROX) 220 MG tablet Take 220 mg by mouth 2 (two) times daily as needed (pain).    Glory Rosebush DELICA LANCETS [id number] MISC Use to check blood sugars once a day Dx E11.9 100 each 3  . ONETOUCH VERIO test strip CHECK BLOOD SUGAR ONCE DAILY. DX250.00 100 each 5  . pravastatin (PRAVACHOL) 20 MG tablet TAKE 1 TABLET AT BEDTIME 90 tablet 3   No current facility-administered medications for this visit.    REVIEW OF SYSTEMS:   Constitutional: Denies fevers, chills or abnormal night sweats Eyes: Denies blurriness of vision, double vision or watery eyes Ears, nose, mouth, throat, and face: Denies mucositis or sore throat Respiratory: Denies cough, dyspnea or wheezes Cardiovascular: Denies palpitation, chest discomfort or lower extremity swelling Gastrointestinal:  Denies nausea, heartburn or change in bowel habits, (+) epigastric pain Skin: Denies abnormal skin rashes Lymphatics: Denies new lymphadenopathy or easy bruising Neurological:Denies numbness, tingling or new weaknesses Behavioral/Psych: Mood is stable, no new changes  All other systems were reviewed with the patient and are negative.  PHYSICAL EXAMINATION: ECOG PERFORMANCE STATUS: 1 - Symptomatic but completely ambulatory  Filed Vitals:   10/07/15 1130  BP: 150/72  Pulse: 87  Temp: 98.4 F (36.9 C)  Resp: 18   Filed Weights   10/07/15 1130    Weight: 133 lb (60.328 kg)    GENERAL:alert, no distress and comfortable SKIN: skin color, texture, turgor are normal, no rashes or significant lesions EYES: normal, conjunctiva are pink and non-injected, sclera clear OROPHARYNX:no exudate, no erythema and lips, buccal mucosa, and tongue normal  NECK: supple, thyroid normal size, non-tender, without nodularity LYMPH:  no palpable lymphadenopathy in the cervical, axillary or inguinal LUNGS: clear to auscultation and percussion with normal breathing effort HEART: regular rate & rhythm and no murmurs and no lower extremity edema ABDOMEN:abdomen soft, non-tender and normal bowel sounds Musculoskeletal:no cyanosis of digits and no clubbing  PSYCH: alert & oriented x 3 with fluent speech NEURO: no focal motor/sensory deficits  LABORATORY DATA:  I have reviewed the data as listed CBC Latest Ref Rng 10/07/2015 09/07/2015 06/17/2015  WBC 4.0 - 10.3 10e3/uL 7.0 6.2 3.9(L)  Hemoglobin 13.0 - 17.1 g/dL 14.4 14.1 15.3  Hematocrit 38.4 - 49.9 % 42.6 42.1 44.5  Platelets 140 - 400 10e3/uL 186 178.0 160.0   CMP Latest Ref Rng 10/07/2015 09/07/2015 06/17/2015  Glucose 70 - 140 mg/dl 137 191(H) 138(H)  BUN 7.0 - 26.0 mg/dL 22.9 24(H) 14  Creatinine 0.7 - 1.3 mg/dL 1.3 1.26 1.18  Sodium 136 - 145 mEq/L 141 144 142  Potassium 3.5 - 5.1 mEq/L 3.6 3.4(L) 3.4(L)  Chloride 96 - 112 mEq/L - 103 100  CO2 22 - 29 mEq/L 28 32 33(H)  Calcium 8.4 - 10.4 mg/dL 9.5 9.4 9.5  Total Protein 6.4 - 8.3 g/dL 7.6 7.0 7.7  Total Bilirubin 0.20 - 1.20 mg/dL 0.47 0.5 0.6  Alkaline Phos 40 - 150 U/L 78 60 68  AST 5 - 34 U/L 25 23 38(H)  ALT 0 - 55 U/L 19 19 25     PATHOLOGY REPORT Diagnosis 09/30/2015 FINE NEEDLE ASPIRATION, ENDOSCOPIC PANCREATIC BODY (SPECIMEN 1 OF 1 COLLECTED 09/30/2015) MALIGNANT CELLS PRESENT, CONSISTENT WITH ADENOCARCINOMA. SEE COMMENT. COMMENT: THE CASE WAS DISCUSSED WITH DR. Ardis Hughs ON 10/01/2015.  RADIOGRAPHIC STUDIES: I have personally reviewed  the radiological images as listed and agreed  with the findings in the report. Ct Chest W Contrast  10/06/2015  CLINICAL DATA:  Pancreatic cancer. EXAM: CT CHEST WITH CONTRAST CT ABDOMEN WITH AND WITHOUT CONTRAST TECHNIQUE: Multidetector CT imaging of the chest was performed during intravenous contrast administration. Multidetector CT imaging of the abdomen and pelvis was performed following the standard protocol before and during bolus administration of intravenous contrast. CONTRAST:  113mL ISOVUE-300 IOPAMIDOL (ISOVUE-300) INJECTION 61% COMPARISON:  09/20/2015. FINDINGS: CT CHEST FINDINGS Mediastinum/Nodes: There is no axillary lymphadenopathy. No mediastinal lymphadenopathy. There is no hilar lymphadenopathy. The heart size is normal. No pericardial effusion. The esophagus has normal imaging features. Lungs/Pleura: Centrilobular emphysema noted. 3 mm right upper lobe pulmonary nodule identified image 21 series 8. 5 mm calcified granuloma noted left upper lobe. Subsegmental atelectasis noted lower lobes bilaterally. Musculoskeletal: Bone windows reveal no worrisome lytic or sclerotic osseous lesions. CT ABDOMEN FINDINGS Hepatobiliary: No focal abnormality within the liver parenchyma. There is no evidence for gallstones, gallbladder wall thickening, or pericholecystic fluid. No intrahepatic or extrahepatic biliary dilation. Pancreas: Hypovascular pancreatic body mass appears slightly more prominent in the interval although reproducible measurement is difficult given the irregular margins of the lesion. The hypovascular area measures 4.0 x 2.7 cm today compared to 3.7 x 2.7 when I remeasure it at the same level and in the same dimensions on the prior study. Spleen: No splenomegaly. No focal mass lesion. Adrenals/Urinary Tract: No adrenal nodule or mass. Stable appearance bilateral renal cysts. Stomach/Bowel: Stomach is nondistended. No gastric wall thickening. No evidence of outlet obstruction. Duodenum is  normally positioned as is the ligament of Treitz. Visualize small bowel of the abdomen is unremarkable. Diverticuli are seen in the abdominal segments of the colon. Vascular/Lymphatic: There is abdominal aortic atherosclerosis without aneurysm. No gastrohepatic ligament lymphadenopathy. 1.4 cm short axis hepatoduodenal ligament lymph node is stable. Tumor encases and attenuates the left gastric artery at its origin. Circumferential tumor encasement of the splenic artery is also evident. Fat plane around the SMA is relatively well preserved although tumor does abut the anterior wall of the proximal SMA. Other: Small volume intraperitoneal free fluid seen adjacent to the liver. Musculoskeletal: Bone windows reveal no worrisome lytic or sclerotic osseous lesions. IMPRESSION: 1. No substantial change in the hypovascular pancreatic lesion consistent with adenocarcinoma. Main duct in the tail the pancreas remains dilated. 2. 3 mm nonspecific right upper lobe pulmonary nodule. Attention on follow-up recommended. 3. Stable 14 mm short axis hepatoduodenal ligament lymph node. This is borderline enlarged and attention on follow-up recommended as metastatic involvement not excluded. Electronically Signed   By: Misty Stanley M.D.   On: 10/06/2015 16:18   US Abdomen Complete  09/08/2015  CLINICAL DATA:  Left mid quadrant pain for 2 weeks. History of partial colectomy. EXAM: ABDOMEN ULTRASOUND COMPLETE COMPARISON:  12/24/2009 renal ultrasound FINDINGS: Gallbladder: No gallstones or wall thickening visualized. There is a triangular echogenic ridge along the posterior wall of the lower gallbladder which is convincing for fold when correlated with transverse view. No sonographic Murphy sign noted by sonographer. Common bile duct: Diameter: 6 mm Liver: No focal lesion identified. Within normal limits in parenchymal echogenicity. IVC: No abnormality visualized. Pancreas: Visualized portion unremarkable. Spleen: Size and appearance  within normal limits. Right Kidney: Length: 11 cm. Echogenicity within normal limits. No solid mass or hydronephrosis visualized. Simple 16 mm cyst. Left Kidney: Length: 11 cm. Echogenicity within normal limits. No solid mass or hydronephrosis visualized. Simple cysts measuring up to 28 mm. Abdominal aorta: Atheromatous aorta without  aneurysm. Maximal diameter 23 mm. Other findings: Site of pain shows peristalsing bowel which is deep to the abdominal wall. IMPRESSION: 1. No explanation for left flank pain. 2. Simple bilateral renal cysts. 3. Aortic atherosclerosis. Electronically Signed   By: Monte Fantasia M.D.   On: 09/08/2015 09:53   Ct Abdomen Pelvis W Contrast  09/20/2015  CLINICAL DATA:  Left lower quadrant abdominal pain for 2 weeks. EXAM: CT ABDOMEN AND PELVIS WITH CONTRAST TECHNIQUE: Multidetector CT imaging of the abdomen and pelvis was performed using the standard protocol following bolus administration of intravenous contrast. CONTRAST:  163mL ISOVUE-300 IOPAMIDOL (ISOVUE-300) INJECTION 61% COMPARISON:  CT scan of March 01, 2007. FINDINGS: Severe multilevel degenerative disc disease is noted in the lumbar spine. Visualized lung bases are unremarkable. Status post left total hip arthroplasty. Severe degenerative joint disease of right hip is noted. No gallstones are noted. The liver and spleen appear normal. 27 x 25 mm ill-defined low density is seen in pancreatic body resulting in severe dilatation of distal pancreatic duct. This is consistent with malignancy. Adrenal glands appear normal. Bilateral renal cysts are noted. No hydronephrosis or renal obstruction is noted. There is no evidence of bowel obstruction. Stool is noted throughout the colon. Atherosclerosis of abdominal aorta is noted without aneurysm formation. Sigmoid diverticulosis is noted without inflammation. Urinary bladder appears normal. No abnormal fluid collection is noted. IMPRESSION: Aortic atherosclerosis. Sigmoid  diverticulosis without inflammation. 27 x 25 mm mass seen in pancreatic body resulting in dilatation of pancreatic duct ; this is consistent with pancreatic malignancy. These results will be called to the ordering clinician or representative by the Radiologist Assistant, and communication documented in the PACS or zVision Dashboard. Electronically Signed   By: Marijo Conception, M.D.   On: 09/20/2015 15:07   Ct Abdomen W Wo Contrast  10/06/2015  CLINICAL DATA:  Pancreatic cancer. EXAM: CT CHEST WITH CONTRAST CT ABDOMEN WITH AND WITHOUT CONTRAST TECHNIQUE: Multidetector CT imaging of the chest was performed during intravenous contrast administration. Multidetector CT imaging of the abdomen and pelvis was performed following the standard protocol before and during bolus administration of intravenous contrast. CONTRAST:  148mL ISOVUE-300 IOPAMIDOL (ISOVUE-300) INJECTION 61% COMPARISON:  09/20/2015. FINDINGS: CT CHEST FINDINGS Mediastinum/Nodes: There is no axillary lymphadenopathy. No mediastinal lymphadenopathy. There is no hilar lymphadenopathy. The heart size is normal. No pericardial effusion. The esophagus has normal imaging features. Lungs/Pleura: Centrilobular emphysema noted. 3 mm right upper lobe pulmonary nodule identified image 21 series 8. 5 mm calcified granuloma noted left upper lobe. Subsegmental atelectasis noted lower lobes bilaterally. Musculoskeletal: Bone windows reveal no worrisome lytic or sclerotic osseous lesions. CT ABDOMEN FINDINGS Hepatobiliary: No focal abnormality within the liver parenchyma. There is no evidence for gallstones, gallbladder wall thickening, or pericholecystic fluid. No intrahepatic or extrahepatic biliary dilation. Pancreas: Hypovascular pancreatic body mass appears slightly more prominent in the interval although reproducible measurement is difficult given the irregular margins of the lesion. The hypovascular area measures 4.0 x 2.7 cm today compared to 3.7 x 2.7 when I  remeasure it at the same level and in the same dimensions on the prior study. Spleen: No splenomegaly. No focal mass lesion. Adrenals/Urinary Tract: No adrenal nodule or mass. Stable appearance bilateral renal cysts. Stomach/Bowel: Stomach is nondistended. No gastric wall thickening. No evidence of outlet obstruction. Duodenum is normally positioned as is the ligament of Treitz. Visualize small bowel of the abdomen is unremarkable. Diverticuli are seen in the abdominal segments of the colon. Vascular/Lymphatic: There is abdominal aortic  atherosclerosis without aneurysm. No gastrohepatic ligament lymphadenopathy. 1.4 cm short axis hepatoduodenal ligament lymph node is stable. Tumor encases and attenuates the left gastric artery at its origin. Circumferential tumor encasement of the splenic artery is also evident. Fat plane around the SMA is relatively well preserved although tumor does abut the anterior wall of the proximal SMA. Other: Small volume intraperitoneal free fluid seen adjacent to the liver. Musculoskeletal: Bone windows reveal no worrisome lytic or sclerotic osseous lesions. IMPRESSION: 1. No substantial change in the hypovascular pancreatic lesion consistent with adenocarcinoma. Main duct in the tail the pancreas remains dilated. 2. 3 mm nonspecific right upper lobe pulmonary nodule. Attention on follow-up recommended. 3. Stable 14 mm short axis hepatoduodenal ligament lymph node. This is borderline enlarged and attention on follow-up recommended as metastatic involvement not excluded. Electronically Signed   By: Misty Stanley M.D.   On: 10/06/2015 16:18   Upper EUS 09/30/2015 Dr. Ardis Hughs  Endosonographic Finding 1. An irregular mass was identified in the pancreatic body. The mass was hypoechoic. The mass measured 25 mm in maximal cross-sectional diameter. The endosonographic borders were poorlydefined. The mass directly abuts the SMA for about 1.5cm and also abuts the Celiac trunk for a  short distance. The remainder of the pancreatic parenchyma was normal. Upstream from the mass, the main pancreatic duct was dilated, a tortuous/ectatic duct and a maximum duct diameter of 5 mm. Fine needle aspiration for cytology was performed. Color Doppler imaging was utilized prior to needle puncture to confirm a lack of significant vascular structures within the needle path. Three passes were made with the 25 gauge needle using a transgastric approach. Some passes were made with a stylet. A cytotechnologist was present to evaluate the adequacy of the specimen. 2. No peripancreatic adenopathy 3. CBD was normal, non-dilated 4. Gallbladder was normal 5. Limited views of liver, spleen, portal and splenic vessels were all normal.  - Schatzki's ring (vs focal peptic stricture) at the GE junction - 2.5cm mass in the body of pancreas causing upstream dilation of the main pancreatic duct, directly abutting the SMA and celiac trunk. Preliminary cytology review from the mass was positive for malignancy (likely adenocarcinoma)   ASSESSMENT & PLAN: 80 year old African-American male, presented with epigastric pain for a month.  1. Primary pancreatic cancer, adenocarcinoma in the body of pancreas,cT2N1M0, stage IIB  -I reviewed his CT scan findings, EUS findings and pericardial mass biopsy pathology results with pt and his wife in details. -His CT scan showed no evidence of December metastasis, there is a 14 mm hepatoduodenal node on CT which is suspicious for nodal metastasis, EUS was negative for peripancreatic adenopathy.  -CT abdomen with contrast (pancreatic protocol) showed the pancreatic mass abuts the SMA. His images were reviewed in our tumor board yesterday, Dr. Barry Dienes feels his pancreas cancer is borderline resectable, and recommend neoadjuvant chemo. -We discussed the role of adjuvant therapy before and after surgery. We reviewed the data of different chemotherapy regimens in pancreatic  cancer, including single agent gemcitabine, gemcitabine and Abraxane, and FOLFIRINOX. Given his advanced age, but preserved performance status and limited medical comorbidities, I recommend him to have neoadjuvant chemotherapy with gemcitabine and Abraxane weekly, 2-3 weeks on, one-week off.  --Chemotherapy consent: Side effects including but does not not limited to, fatigue, nausea, vomiting, diarrhea, hair loss, neuropathy, fluid retention, renal and kidney dysfunction, neutropenic fever, needed for blood transfusion, bleeding, were discussed with patient in great detail. He agrees to proceed. -We'll tentatively start his chemotherapy next week -He  is scheduled to see Dr. Barry Dienes next Monday  2. DM -He is not on medication, however his recent HbA1c was 7.9 IN 05/2015 -I recommend him to see his primary care physician Dr. Quay Burow, and consider restart metformin which he was on before -We discussed the impact of chemotherapy, especially premedication dexamethasone, on his blood glucose. We will try to use minimal or no steroids before chemotherapy duties diabetes  3. Abdominal pain -secondary to #1 -He takes Tylenol and ibuprofen as needed, not very often, we'll continue -If his pain gets worse, we'll add on tramadol  4. Prostate cancer, untreated  -His prostate cancer has been monitored by his urologist, treatment was not recommended -will follow up clinically    Recommendations: Based on information available as of today's consult. Recommendations may change depending on the results of further tests or exams. 1) Chemotherapy weekly Gemzar/Abraxane (3 on/1 off) x 2 months and re scan 2) Surgery per Dr. Barry Dienes if able to resect tumor  3) Keep bowels soft with Colace (OTC) 1-2/day & call if pain worsens or not controlled 4) Still with treatment/surgery can be up to 50% cancer can recur-depends upon how aggressive the cancer tumor  is ____________________________________________________________________________ Next Steps: 1) Chemo teaching class and nutrition referral. Labs today. 2) See Dr. Barry Dienes on 7/17 as scheduled-will check into getting port placed next week Contact your PCP to discuss medication for your diabetes  3) I will see you back in 2 weeks before week 2 chemo  4) you may need a port placed   Orders Placed This Encounter  Procedures  . CT Abdomen W Contrast    Standing Status: Future     Number of Occurrences:      Standing Expiration Date: 10/05/2016    Scheduling Instructions:     Pancreatic protocol    Order Specific Question:  If indicated for the ordered procedure, I authorize the administration of contrast media per Radiology protocol    Answer:  Yes    Order Specific Question:  Reason for Exam (SYMPTOM  OR DIAGNOSIS REQUIRED)    Answer:  evaluate pancreatic mass resectability    Order Specific Question:  Preferred imaging location?    Answer:  [facility]  . CBC with Differential    Standing Status: Standing     Number of Occurrences: 30     Standing Expiration Date: 10/07/2018  . Comprehensive metabolic panel    Standing Status: Standing     Number of Occurrences: 30     Standing Expiration Date: 10/07/2018  . CA 19.9    Standing Status: Standing     Number of Occurrences: 30     Standing Expiration Date: 10/07/2018  . Ambulatory referral to Nutrition and Diabetic E    Referral Priority:  Routine    Referral Type:  Consultation    Referral Reason:  Specialty Services Required    Number of Visits Requested:  1    All questions were answered. The patient knows to call the clinic with any problems, questions or concerns. I spent 55 minutes counseling the patient face to face. The total time spent in the appointment was 60 minutes and more than 50% was on counseling.     Truitt Merle, MD 10/07/2015 4:58 PM

## 2015-10-07 NOTE — Progress Notes (Signed)
Oncology Nurse Navigator Documentation  Oncology Nurse Navigator Flowsheets 10/07/2015  Navigator Location [facility]  Navigator Encounter Type Initial MedOnc  Telephone -  Abnormal Finding Date -  Confirmed Diagnosis Date -  Patient Visit Type MedOnc;Initial  Treatment Phase Pre-Tx/Tx Discussion  Barriers/Navigation Needs Education  Education Understanding Cancer/ Treatment Options;Coping with Diagnosis/ Prognosis;Pain/ Symptom Management;Newly Diagnosed Cancer Education;Preparing for Upcoming Surgery/ Treatment  Interventions Referrals;Coordination of Care;Education Method  Referrals Nutrition/dietician  Coordination of Care Other--message to Dr. Barry Dienes for need for port  Education Method Verbal;Written;Teach-back  Support Groups/Services GI Support Group;[place];Other--[facility]  Acuity Level 2  Time Spent with Patient 73  Met with patient and wife, Deloris during new patient visit. Explained the role of the GI Nurse Navigator and provided New Patient Packet with information on: 1. Pancreas cancer--PAC, CA 19-9, anatomy of pancreas 2. Support groups 3. Advanced Directives 4. Fall Safety Plan Answered questions, reviewed current treatment plan using TEACH back and provided emotional support. Provided copy of current treatment plan.  Lionel and his wife have been married for 54+ years. They moved to [place] from [place] after retirement. 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 He served several years in the [employer]. He and his wife have one son and one 33 yo granddaughter. He enjoys working outdoor and working around the house fixing things. He rides a bike couple miles/day weather permitting.  Currently his pain is controlled with Aleve and he is having some mild constipation-will try otc colace 1-2/day. He agrees to call his PCP regarding need to get his diabetes under better control since his chemotherapy will include steroids  weekly as well as surgery needs good control. Will plan to begin his chemotherapy the week of 10/11/15.  Merceda Elks, RN, BSN GI Oncology [place]

## 2015-10-08 ENCOUNTER — Telehealth: Payer: Self-pay | Admitting: Hematology

## 2015-10-08 LAB — CANCER ANTIGEN 19-9: CAN 19-9: 516 U/mL — AB (ref 0–35)

## 2015-10-08 NOTE — Telephone Encounter (Signed)
Gave patient avs report and appointments for July and August prior to leaving yesterday. Confirmed with desk nurse yesterday no imaging studies needed at this time - patient aware.

## 2015-10-11 ENCOUNTER — Encounter (HOSPITAL_COMMUNITY): Payer: Self-pay | Admitting: *Deleted

## 2015-10-11 ENCOUNTER — Other Ambulatory Visit: Payer: Self-pay | Admitting: Emergency Medicine

## 2015-10-11 ENCOUNTER — Other Ambulatory Visit: Payer: Self-pay | Admitting: *Deleted

## 2015-10-11 ENCOUNTER — Telehealth: Payer: Self-pay | Admitting: Hematology

## 2015-10-11 ENCOUNTER — Other Ambulatory Visit: Payer: Self-pay | Admitting: General Surgery

## 2015-10-11 ENCOUNTER — Encounter: Payer: Self-pay | Admitting: Hematology

## 2015-10-11 DIAGNOSIS — E119 Type 2 diabetes mellitus without complications: Secondary | ICD-10-CM

## 2015-10-11 DIAGNOSIS — I10 Essential (primary) hypertension: Secondary | ICD-10-CM

## 2015-10-11 DIAGNOSIS — E785 Hyperlipidemia, unspecified: Secondary | ICD-10-CM

## 2015-10-11 DIAGNOSIS — Z0000 Encounter for general adult medical examination without abnormal findings: Secondary | ICD-10-CM

## 2015-10-11 DIAGNOSIS — C251 Malignant neoplasm of body of pancreas: Secondary | ICD-10-CM | POA: Diagnosis not present

## 2015-10-11 MED ORDER — HYDROCHLOROTHIAZIDE 12.5 MG PO CAPS: 12.5000 mg | ORAL_CAPSULE | Freq: Every day | ORAL | Status: AC

## 2015-10-11 NOTE — H&P (Signed)
[name] 10/11/2015 11:14 AM Location: [facility] Patient #: [id number] DOB: 02-Sep-1935 Married / Language: English / Race: Black or African American Male   History of Present Illness [name] MD; 10/11/2015 1:04 PM) The patient is a 80 year old male who presents with pancreatic cancer. Patient is a 80 year old male referred by Dr. Burr Medico for consultation regarding his new pancreatic cancer. The patient presented with a month of abdominal pain radiating to the back. He also was having a little bit more fatigued than normal. He is retired from working in the [employer]. Is able to manage his own yard work and bike several miles a day. He denies any weight loss. He is usually able to control his abdominal pain with Aleve, but this is the more difficult. He had a CT scan that demonstrated a pancreatic body mass. He was subsequently referred to Dr. Ardis Hughs for endoscopic ultrasound. Fine-needle aspiration showed adenocarcinoma. He is a survivor from prostate cancer. He is also diabetic. He has been previously on metformin but now is managing with diet alone. He denies change in his appetite or diarrhea.  Family history is significant for hypertension in his father as well as kidney disease. Has no family history of cancer.    LABORATORY RESULTS: Available labs are reviewed  Recent Results (from the past 2160 hour(s)) Comprehensive metabolic panel Status: Abnormal Collection Time: 09/07/15 3:13 PM Result Value Ref Range Sodium 144 135 - 145 mEq/L Potassium 3.4 (L) 3.5 - 5.1 mEq/L Chloride 103 96 - 112 mEq/L CO2 32 19 - 32 mEq/L Glucose, Bld 191 (H) 70 - 99 mg/dL BUN 24 (H) 6 - 23 mg/dL Creatinine, Ser 1.26 0.40 - 1.50 mg/dL Total Bilirubin 0.5 0.2 - 1.2 mg/dL Alkaline Phosphatase 60 39 - 117 U/L AST 23 0 - 37 U/L ALT 19 0 - 53 U/L Total Protein 7.0 6.0 - 8.3 g/dL Albumin 4.2 3.5 - 5.2 g/dL Calcium 9.4 8.4 - 10.5 mg/dL GFR 71.26 >60.00  mL/min CBC with Differential/Platelet Status: Abnormal Collection Time: 09/07/15 3:13 PM Result Value Ref Range WBC 6.2 4.0 - 10.5 K/uL RBC 5.20 4.22 - 5.81 Mil/uL Hemoglobin 14.1 13.0 - 17.0 g/dL HCT 42.1 39.0 - 52.0 % MCV 81.0 78.0 - 100.0 fl MCHC 33.4 30.0 - 36.0 g/dL RDW 14.5 11.5 - 15.5 % Platelets 178.0 150.0 - 400.0 K/uL Neutrophils Relative % 61.4 43.0 - 77.0 % Lymphocytes Relative 20.8 12.0 - 46.0 % Monocytes Relative 10.0 3.0 - 12.0 % Eosinophils Relative 7.5 (H) 0.0 - 5.0 % Basophils Relative 0.3 0.0 - 3.0 % Neutro Abs 3.8 1.4 - 7.7 K/uL Lymphs Abs 1.3 0.7 - 4.0 K/uL Monocytes Absolute 0.6 0.1 - 1.0 K/uL Eosinophils Absolute 0.5 0.0 - 0.7 K/uL Basophils Absolute 0.0 0.0 - 0.1 K/uL Lipase Status: None Collection Time: 09/07/15 3:13 PM Result Value Ref Range Lipase 37.0 11.0 - 59.0 U/L Amylase Status: None Collection Time: 09/07/15 3:13 PM Result Value Ref Range Amylase 97 27 - 131 U/L Urinalysis, Routine w reflex microscopic Status: Abnormal Collection Time: 09/07/15 3:13 PM Result Value Ref Range Color, Urine YELLOW Yellow;Lt. Yellow APPearance CLEAR Clear Specific Gravity, Urine 1.020 1.000-1.030 pH 6.0 5.0 - 8.0 Total Protein, Urine TRACE (A) Negative Urine Glucose NEGATIVE Negative Ketones, ur TRACE (A) Negative Bilirubin Urine NEGATIVE Negative Hgb urine dipstick NEGATIVE Negative Urobilinogen, UA 0.2 0.0 - 1.0 Leukocytes, UA NEGATIVE Negative Nitrite NEGATIVE Negative WBC, UA 3-6/hpf (A) 0-2/hpf RBC / HPF 0-2/hpf 0-2/hpf Urine Culture Status: None Collection  Time: 09/07/15 3:13 PM Result Value Ref Range Colony Count 2,000 COLONIES/ML Organism ID, Bacteria Insignificant Growth Urine Microalbumin w/creat. ratio Status: Abnormal Collection Time: 09/07/15 3:13 PM Result Value Ref Range Microalb, Ur 5.3 (H) 0.0 - 1.9 mg/dL Creatinine,U 224.7 mg/dL Microalb Creat Ratio 2.4 0.0 - 30.0 mg/g Cancer antigen 19-9 Status:  Abnormal Collection Time: 09/30/15 6:36 AM Result Value Ref Range CA 19-9 370 (H) 0 - 35 U/mL Comment: (NOTE) Roche ECLIA methodology Performed At: [facility] [place], [place] [id number] Lindon Romp MD YN:[id number] Glucose, capillary Status: Abnormal Collection Time: 09/30/15 6:53 AM Result Value Ref Range Glucose-Capillary 152 (H) 65 - 99 mg/dL CBC with Differential Status: None Collection Time: 10/07/15 1:49 PM Result Value Ref Range WBC 7.0 4.0 - 10.3 10e3/uL NEUT# 4.9 1.5 - 6.5 10e3/uL HGB 14.4 13.0 - 17.1 g/dL HCT 42.6 38.4 - 49.9 % Platelets 186 140 - 400 10e3/uL MCV 80.7 79.3 - 98.0 fL MCH 27.3 27.2 - 33.4 pg MCHC 33.8 32.0 - 36.0 g/dL RBC 5.28 4.20 - 5.82 10e6/uL RDW 14.2 11.0 - 14.6 % lymph# 1.2 0.9 - 3.3 10e3/uL MONO# 0.8 0.1 - 0.9 10e3/uL Eosinophils Absolute 0.1 0.0 - 0.5 10e3/uL Basophils Absolute 0.0 0.0 - 0.1 10e3/uL NEUT% 69.7 39.0 - 75.0 % LYMPH% 17.7 14.0 - 49.0 % MONO% 10.7 0.0 - 14.0 % EOS% 1.3 0.0 - 7.0 % BASO% 0.6 0.0 - 2.0 % Comprehensive metabolic panel Status: Abnormal Collection Time: 10/07/15 1:49 PM Result Value Ref Range Sodium 141 136 - 145 mEq/L Potassium 3.6 3.5 - 5.1 mEq/L Chloride 102 98 - 109 mEq/L CO2 28 22 - 29 mEq/L Glucose 137 70 - 140 mg/dl Comment: Glucose reference range is for nonfasting patients. Fasting glucose reference range is 70- 100. BUN 22.9 7.0 - 26.0 mg/dL Creatinine 1.3 0.7 - 1.3 mg/dL Total Bilirubin 0.47 0.20 - 1.20 mg/dL Alkaline Phosphatase 78 40 - 150 U/L AST 25 5 - 34 U/L ALT 19 0 - 55 U/L Total Protein 7.6 6.4 - 8.3 g/dL Albumin 3.8 3.5 - 5.0 g/dL Calcium 9.5 8.4 - 10.4 mg/dL Anion Gap 11 3 - 11 mEq/L EGFR 61 (L) >90 ml/min/1.73 m2 Comment: eGFR is calculated using the CKD-EPI Creatinine Equation (2009) CA 19.9 Status: Abnormal Collection Time: 10/07/15 1:49 PM Result Value Ref Range CA 19-9 516 (H) 0 - 35 U/mL Comment: Roche ECLIA  methodology    RADIOLOGY RESULTS: See E-Chart or I-Site for most recent results. Images and reports are reviewed.  Ct Chest W Contrast  10/06/2015 CLINICAL DATA: Pancreatic cancer. EXAM: CT CHEST WITH CONTRAST CT ABDOMEN WITH AND WITHOUT CONTRAST TECHNIQUE: Multidetector CT imaging of the chest was performed during intravenous contrast administration. Multidetector CT imaging of the abdomen and pelvis was performed following the standard protocol before and during bolus administration of intravenous contrast. CONTRAST: 125m ISOVUE-300 IOPAMIDOL (ISOVUE-300) INJECTION 61% COMPARISON: 09/20/2015. FINDINGS: CT CHEST FINDINGS Mediastinum/Nodes: There is no axillary lymphadenopathy. No mediastinal lymphadenopathy. There is no hilar lymphadenopathy. The heart size is normal. No pericardial effusion. The esophagus has normal imaging features. Lungs/Pleura: Centrilobular emphysema noted. 3 mm right upper lobe pulmonary nodule identified image 21 series 8. 5 mm calcified granuloma noted left upper lobe. Subsegmental atelectasis noted lower lobes bilaterally. Musculoskeletal: Bone windows reveal no worrisome lytic or sclerotic osseous lesions. CT ABDOMEN FINDINGS Hepatobiliary: No focal abnormality within the liver parenchyma. There is no evidence for gallstones, gallbladder wall thickening, or pericholecystic fluid. No intrahepatic or extrahepatic biliary dilation. Pancreas: Hypovascular pancreatic body  mass appears slightly more prominent in the interval although reproducible measurement is difficult given the irregular margins of the lesion. The hypovascular area measures 4.0 x 2.7 cm today compared to 3.7 x 2.7 when I remeasure it at the same level and in the same dimensions on the prior study. Spleen: No splenomegaly. No focal mass lesion. Adrenals/Urinary Tract: No adrenal nodule or mass. Stable appearance bilateral renal cysts. Stomach/Bowel: Stomach is nondistended. No gastric wall thickening. No  evidence of outlet obstruction. Duodenum is normally positioned as is the ligament of Treitz. Visualize small bowel of the abdomen is unremarkable. Diverticuli are seen in the abdominal segments of the colon. Vascular/Lymphatic: There is abdominal aortic atherosclerosis without aneurysm. No gastrohepatic ligament lymphadenopathy. 1.4 cm short axis hepatoduodenal ligament lymph node is stable. Tumor encases and attenuates the left gastric artery at its origin. Circumferential tumor encasement of the splenic artery is also evident. Fat plane around the SMA is relatively well preserved although tumor does abut the anterior wall of the proximal SMA. Other: Small volume intraperitoneal free fluid seen adjacent to the liver. Musculoskeletal: Bone windows reveal no worrisome lytic or sclerotic osseous lesions. IMPRESSION: 1. No substantial change in the hypovascular pancreatic lesion consistent with adenocarcinoma. Main duct in the tail the pancreas remains dilated. 2. 3 mm nonspecific right upper lobe pulmonary nodule. Attention on follow-up recommended. 3. Stable 14 mm short axis hepatoduodenal ligament lymph node. This is borderline enlarged and attention on follow-up recommended as metastatic involvement not excluded. Electronically Signed By: Misty Stanley M.D. On: 10/06/2015 16:18  Ct Abdomen Pelvis W Contrast  09/20/2015 CLINICAL DATA: Left lower quadrant abdominal pain for 2 weeks. EXAM: CT ABDOMEN AND PELVIS WITH CONTRAST TECHNIQUE: Multidetector CT imaging of the abdomen and pelvis was performed using the standard protocol following bolus administration of intravenous contrast. CONTRAST: 169m ISOVUE-300 IOPAMIDOL (ISOVUE-300) INJECTION 61% COMPARISON: CT scan of March 01, 2007. FINDINGS: Severe multilevel degenerative disc disease is noted in the lumbar spine. Visualized lung bases are unremarkable. Status post left total hip arthroplasty. Severe degenerative joint disease of right hip is noted. No  gallstones are noted. The liver and spleen appear normal. 27 x 25 mm ill-defined low density is seen in pancreatic body resulting in severe dilatation of distal pancreatic duct. This is consistent with malignancy. Adrenal glands appear normal. Bilateral renal cysts are noted. No hydronephrosis or renal obstruction is noted. There is no evidence of bowel obstruction. Stool is noted throughout the colon. Atherosclerosis of abdominal aorta is noted without aneurysm formation. Sigmoid diverticulosis is noted without inflammation. Urinary bladder appears normal. No abnormal fluid collection is noted. IMPRESSION: Aortic atherosclerosis. Sigmoid diverticulosis without inflammation. 27 x 25 mm mass seen in pancreatic body resulting in dilatation of pancreatic duct ; this is consistent with pancreatic malignancy. These results will be called to the ordering clinician or representative by the Radiologist Assistant, and communication documented in the PACS or zVision Dashboard. Electronically Signed By: JMarijo Conception M.D. On: 09/20/2015 15:07  Ct Abdomen W Wo Contrast  10/06/2015 CLINICAL DATA: Pancreatic cancer. EXAM: CT CHEST WITH CONTRAST CT ABDOMEN WITH AND WITHOUT CONTRAST TECHNIQUE: Multidetector CT imaging of the chest was performed during intravenous contrast administration. Multidetector CT imaging of the abdomen and pelvis was performed following the standard protocol before and during bolus administration of intravenous contrast. CONTRAST: 1044mISOVUE-300 IOPAMIDOL (ISOVUE-300) INJECTION 61% COMPARISON: 09/20/2015. FINDINGS: CT CHEST FINDINGS Mediastinum/Nodes: There is no axillary lymphadenopathy. No mediastinal lymphadenopathy. There is no hilar lymphadenopathy. The heart size is normal.  No pericardial effusion. The esophagus has normal imaging features. Lungs/Pleura: Centrilobular emphysema noted. 3 mm right upper lobe pulmonary nodule identified image 21 series 8. 5 mm calcified granuloma noted  left upper lobe. Subsegmental atelectasis noted lower lobes bilaterally. Musculoskeletal: Bone windows reveal no worrisome lytic or sclerotic osseous lesions. CT ABDOMEN FINDINGS Hepatobiliary: No focal abnormality within the liver parenchyma. There is no evidence for gallstones, gallbladder wall thickening, or pericholecystic fluid. No intrahepatic or extrahepatic biliary dilation. Pancreas: Hypovascular pancreatic body mass appears slightly more prominent in the interval although reproducible measurement is difficult given the irregular margins of the lesion. The hypovascular area measures 4.0 x 2.7 cm today compared to 3.7 x 2.7 when I remeasure it at the same level and in the same dimensions on the prior study. Spleen: No splenomegaly. No focal mass lesion. Adrenals/Urinary Tract: No adrenal nodule or mass. Stable appearance bilateral renal cysts. Stomach/Bowel: Stomach is nondistended. No gastric wall thickening. No evidence of outlet obstruction. Duodenum is normally positioned as is the ligament of Treitz. Visualize small bowel of the abdomen is unremarkable. Diverticuli are seen in the abdominal segments of the colon. Vascular/Lymphatic: There is abdominal aortic atherosclerosis without aneurysm. No gastrohepatic ligament lymphadenopathy. 1.4 cm short axis hepatoduodenal ligament lymph node is stable. Tumor encases and attenuates the left gastric artery at its origin. Circumferential tumor encasement of the splenic artery is also evident. Fat plane around the SMA is relatively well preserved although tumor does abut the anterior wall of the proximal SMA. Other: Small volume intraperitoneal free fluid seen adjacent to the liver. Musculoskeletal: Bone windows reveal no worrisome lytic or sclerotic osseous lesions. IMPRESSION: 1. No substantial change in the hypovascular pancreatic lesion consistent with adenocarcinoma. Main duct in the tail the pancreas remains dilated. 2. 3 mm nonspecific right upper lobe  pulmonary nodule. Attention on follow-up recommended. 3. Stable 14 mm short axis hepatoduodenal ligament lymph node. This is borderline enlarged and attention on follow-up recommended as metastatic involvement not excluded. Electronically Signed By: Misty Stanley M.D. On: 10/06/2015 16:18     Other Problems Elbert Ewings, CMA; 10/11/2015 11:14 AM) Arthritis Diabetes Mellitus Diverticulosis Enlarged Prostate Gastroesophageal Reflux Disease High blood pressure Inguinal Hernia Kidney Stone Prostate Cancer  Past Surgical History Elbert Ewings, CMA; 10/11/2015 11:14 AM) Appendectomy Colon Removal - Partial Foot Surgery Right. Hip Surgery Left. Open Inguinal Hernia Surgery Bilateral. Oral Surgery Shoulder Surgery Right.  Diagnostic Studies History Elbert Ewings, [place]; 10/11/2015 11:14 AM) Colonoscopy 5-10 years ago  Allergies Elbert Ewings, CMA; 10/11/2015 11:16 AM) Cipro *FLUOROQUINOLONES* Benazepril HCl *CHEMICALS* ACE Inhibitors  Medication History Elbert Ewings, CMA; 10/11/2015 11:17 AM) AmLODIPine Besylate (10MG Tablet, Oral) Active. HydroCHLOROthiazide (12.5MG Capsule, Oral) Active. Pravastatin Sodium (20MG Tablet, Oral) Active. Multiple Vitamin (Oral) Active. Aleve (220MG Capsule, Oral) Active. Aspirin (81MG Tablet, Oral) Active. MetFORMIN HCl (500MG Tablet, Oral) Active. Latanoprost (0.005% Solution, Ophthalmic) Active. Medications Reconciled  Social History Elbert Ewings, [place]; 10/11/2015 11:14 AM) Alcohol use Occasional alcohol use. Caffeine use Coffee. No drug use Tobacco use Former smoker.  Family History Elbert Ewings, [place]; 10/11/2015 11:14 AM) Family history unknown First Degree Relatives    Review of Systems Elbert Ewings CMA; 10/11/2015 11:14 AM) General Not Present- Appetite Loss, Chills, Fatigue, Fever, Night Sweats, Weight Gain and Weight Loss. Skin Not Present- Change in Wart/Mole, Dryness, Hives, Jaundice, New Lesions,  Non-Healing Wounds, Rash and Ulcer. HEENT Present- Wears glasses/contact lenses. Not Present- Earache, Hearing Loss, Hoarseness, Nose Bleed, Oral Ulcers, Ringing in the Ears, Seasonal Allergies, Sinus Pain, Sore Throat,  Visual Disturbances and Yellow Eyes. Respiratory Not Present- Bloody sputum, Chronic Cough, Difficulty Breathing, Snoring and Wheezing. Breast Not Present- Breast Mass, Breast Pain, Nipple Discharge and Skin Changes. Cardiovascular Not Present- Chest Pain, Difficulty Breathing Lying Down, Leg Cramps, Palpitations, Rapid Heart Rate, Shortness of Breath and Swelling of Extremities. Gastrointestinal Present- Abdominal Pain, Excessive gas and Indigestion. Not Present- Bloating, Bloody Stool, Change in Bowel Habits, Chronic diarrhea, Constipation, Difficulty Swallowing, Gets full quickly at meals, Hemorrhoids, Nausea, Rectal Pain and Vomiting. Male Genitourinary Not Present- Blood in Urine, Change in Urinary Stream, Frequency, Impotence, Nocturia, Painful Urination, Urgency and Urine Leakage. Musculoskeletal Present- Back Pain and Swelling of Extremities. Not Present- Joint Pain, Joint Stiffness, Muscle Pain and Muscle Weakness. Neurological Not Present- Decreased Memory, Fainting, Headaches, Numbness, Seizures, Tingling, Tremor, Trouble walking and Weakness. Psychiatric Not Present- Anxiety, Bipolar, Change in Sleep Pattern, Depression, Fearful and Frequent crying. Endocrine Present- New Diabetes. Not Present- Cold Intolerance, Excessive Hunger, Hair Changes, Heat Intolerance and Hot flashes. Hematology Not Present- Blood Thinners, Easy Bruising, Excessive bleeding, Gland problems, HIV and Persistent Infections.  Vitals Elbert Ewings CMA; 10/11/2015 11:17 AM) 10/11/2015 11:17 AM Weight: 129 lb Height: 70in Body Surface Area: 1.73 m Body Mass Index: 18.51 kg/m  Temp.: 97.80F(Temporal)  Pulse: 85 (Regular)  BP: 126/76 (Sitting, Left Arm, Standard)       Assessment &  Plan [name] MD; 10/11/2015 1:07 PM) PRIMARY CANCER OF BODY OF PANCREAS (C25.1) Impression: The patient has a new diagnosis of borderline resectable pancreatic cancer. This is cT2N1M0, stage IIB pancreatic cancer. The mass does not appear to involve the celiac axis directly, but it does involve the left gastric artery and the splenic artery. It appears to be encasing the left gastric artery. It is also abutting the superior mesenteric artery anteriorly. He is going to get chemotherapy and then reassess. I suspect that he likely may not become resectable. If that is the case, we would pursue SBRT.  He also may not elect to have surgery due to risk benefit ratio. He is 28 and gets around well. I suspect that he would become much more frail with surgery.  In any event, I discussed the risks of Port-A-Cath placement with him. I reviewed the risk of leaving, infection, malfunction, possible need for additional surgeries or procedures, possible risk of pneumothorax. He understands and wishes to proceed. We'll make every effort to get that done this week. Current Plans Pt Education - [facility] port insertion education Schedule for Surgery   Signed by [name], MD (10/11/2015 1:08 PM)

## 2015-10-11 NOTE — Progress Notes (Signed)
per [facility] it needs prior auth-left mess. sent to covermymeds

## 2015-10-11 NOTE — Telephone Encounter (Signed)
spoke w/ pt confirmed 7/24 nut apt... no availability for 7/18

## 2015-10-12 ENCOUNTER — Encounter: Payer: Self-pay | Admitting: *Deleted

## 2015-10-12 ENCOUNTER — Other Ambulatory Visit: Payer: Medicare Other

## 2015-10-12 ENCOUNTER — Telehealth: Payer: Self-pay | Admitting: *Deleted

## 2015-10-12 ENCOUNTER — Other Ambulatory Visit: Payer: Self-pay | Admitting: *Deleted

## 2015-10-12 DIAGNOSIS — C259 Malignant neoplasm of pancreas, unspecified: Secondary | ICD-10-CM

## 2015-10-12 MED ORDER — LIDOCAINE-PRILOCAINE 2.5-2.5 % EX CREA: 1.0000 "application " | TOPICAL_CREAM | CUTANEOUS | Status: AC | PRN

## 2015-10-12 NOTE — H&P (Deleted)
[name] 10/11/2015 11:14 AM Location: [facility] Patient #: [id number] DOB: 1935/05/10 Married / Language: English / Race: Black or African American Male   History of Present Illness [name] MD; 10/11/2015 1:04 PM) The patient is a 80 year old male who presents with pancreatic cancer. Patient is a 80 year old male referred by Dr. Burr Medico for consultation regarding his new pancreatic cancer. The patient presented with a month of abdominal pain radiating to the back. He also was having a little bit more fatigued than normal. He is retired from working in the [employer]. Is able to manage his own yard work and bike several miles a day. He denies any weight loss. He is usually able to control his abdominal pain with Aleve, but this is the more difficult. He had a CT scan that demonstrated a pancreatic body mass. He was subsequently referred to Dr. Ardis Hughs for endoscopic ultrasound. Fine-needle aspiration showed adenocarcinoma. He is a survivor from prostate cancer. He is also diabetic. He has been previously on metformin but now is managing with diet alone. He denies change in his appetite or diarrhea.  Family history is significant for hypertension in his father as well as kidney disease. Has no family history of cancer.    LABORATORY RESULTS: Available labs are reviewed  Recent Results (from the past 2160 hour(s)) Comprehensive metabolic panel Status: Abnormal Collection Time: 09/07/15 3:13 PM Result Value Ref Range Sodium 144 135 - 145 mEq/L Potassium 3.4 (L) 3.5 - 5.1 mEq/L Chloride 103 96 - 112 mEq/L CO2 32 19 - 32 mEq/L Glucose, Bld 191 (H) 70 - 99 mg/dL BUN 24 (H) 6 - 23 mg/dL Creatinine, Ser 1.26 0.40 - 1.50 mg/dL Total Bilirubin 0.5 0.2 - 1.2 mg/dL Alkaline Phosphatase 60 39 - 117 U/L AST 23 0 - 37 U/L ALT 19 0 - 53 U/L Total Protein 7.0 6.0 - 8.3 g/dL Albumin 4.2 3.5 - 5.2 g/dL Calcium 9.4 8.4 - 10.5 mg/dL GFR 71.26 >60.00  mL/min CBC with Differential/Platelet Status: Abnormal Collection Time: 09/07/15 3:13 PM Result Value Ref Range WBC 6.2 4.0 - 10.5 K/uL RBC 5.20 4.22 - 5.81 Mil/uL Hemoglobin 14.1 13.0 - 17.0 g/dL HCT 42.1 39.0 - 52.0 % MCV 81.0 78.0 - 100.0 fl MCHC 33.4 30.0 - 36.0 g/dL RDW 14.5 11.5 - 15.5 % Platelets 178.0 150.0 - 400.0 K/uL Neutrophils Relative % 61.4 43.0 - 77.0 % Lymphocytes Relative 20.8 12.0 - 46.0 % Monocytes Relative 10.0 3.0 - 12.0 % Eosinophils Relative 7.5 (H) 0.0 - 5.0 % Basophils Relative 0.3 0.0 - 3.0 % Neutro Abs 3.8 1.4 - 7.7 K/uL Lymphs Abs 1.3 0.7 - 4.0 K/uL Monocytes Absolute 0.6 0.1 - 1.0 K/uL Eosinophils Absolute 0.5 0.0 - 0.7 K/uL Basophils Absolute 0.0 0.0 - 0.1 K/uL Lipase Status: None Collection Time: 09/07/15 3:13 PM Result Value Ref Range Lipase 37.0 11.0 - 59.0 U/L Amylase Status: None Collection Time: 09/07/15 3:13 PM Result Value Ref Range Amylase 97 27 - 131 U/L Urinalysis, Routine w reflex microscopic Status: Abnormal Collection Time: 09/07/15 3:13 PM Result Value Ref Range Color, Urine YELLOW Yellow;Lt. Yellow APPearance CLEAR Clear Specific Gravity, Urine 1.020 1.000-1.030 pH 6.0 5.0 - 8.0 Total Protein, Urine TRACE (A) Negative Urine Glucose NEGATIVE Negative Ketones, ur TRACE (A) Negative Bilirubin Urine NEGATIVE Negative Hgb urine dipstick NEGATIVE Negative Urobilinogen, UA 0.2 0.0 - 1.0 Leukocytes, UA NEGATIVE Negative Nitrite NEGATIVE Negative WBC, UA 3-6/hpf (A) 0-2/hpf RBC / HPF 0-2/hpf 0-2/hpf Urine Culture Status: None Collection  Time: 09/07/15 3:13 PM Result Value Ref Range Colony Count 2,000 COLONIES/ML Organism ID, Bacteria Insignificant Growth Urine Microalbumin w/creat. ratio Status: Abnormal Collection Time: 09/07/15 3:13 PM Result Value Ref Range Microalb, Ur 5.3 (H) 0.0 - 1.9 mg/dL Creatinine,U 224.7 mg/dL Microalb Creat Ratio 2.4 0.0 - 30.0 mg/g Cancer antigen 19-9 Status:  Abnormal Collection Time: 09/30/15 6:36 AM Result Value Ref Range CA 19-9 370 (H) 0 - 35 U/mL Comment: (NOTE) Roche ECLIA methodology Performed At: [facility] [place], [place] [id number] Lindon Romp MD YN:[id number] Glucose, capillary Status: Abnormal Collection Time: 09/30/15 6:53 AM Result Value Ref Range Glucose-Capillary 152 (H) 65 - 99 mg/dL CBC with Differential Status: None Collection Time: 10/07/15 1:49 PM Result Value Ref Range WBC 7.0 4.0 - 10.3 10e3/uL NEUT# 4.9 1.5 - 6.5 10e3/uL HGB 14.4 13.0 - 17.1 g/dL HCT 42.6 38.4 - 49.9 % Platelets 186 140 - 400 10e3/uL MCV 80.7 79.3 - 98.0 fL MCH 27.3 27.2 - 33.4 pg MCHC 33.8 32.0 - 36.0 g/dL RBC 5.28 4.20 - 5.82 10e6/uL RDW 14.2 11.0 - 14.6 % lymph# 1.2 0.9 - 3.3 10e3/uL MONO# 0.8 0.1 - 0.9 10e3/uL Eosinophils Absolute 0.1 0.0 - 0.5 10e3/uL Basophils Absolute 0.0 0.0 - 0.1 10e3/uL NEUT% 69.7 39.0 - 75.0 % LYMPH% 17.7 14.0 - 49.0 % MONO% 10.7 0.0 - 14.0 % EOS% 1.3 0.0 - 7.0 % BASO% 0.6 0.0 - 2.0 % Comprehensive metabolic panel Status: Abnormal Collection Time: 10/07/15 1:49 PM Result Value Ref Range Sodium 141 136 - 145 mEq/L Potassium 3.6 3.5 - 5.1 mEq/L Chloride 102 98 - 109 mEq/L CO2 28 22 - 29 mEq/L Glucose 137 70 - 140 mg/dl Comment: Glucose reference range is for nonfasting patients. Fasting glucose reference range is 70- 100. BUN 22.9 7.0 - 26.0 mg/dL Creatinine 1.3 0.7 - 1.3 mg/dL Total Bilirubin 0.47 0.20 - 1.20 mg/dL Alkaline Phosphatase 78 40 - 150 U/L AST 25 5 - 34 U/L ALT 19 0 - 55 U/L Total Protein 7.6 6.4 - 8.3 g/dL Albumin 3.8 3.5 - 5.0 g/dL Calcium 9.5 8.4 - 10.4 mg/dL Anion Gap 11 3 - 11 mEq/L EGFR 61 (L) >90 ml/min/1.73 m2 Comment: eGFR is calculated using the CKD-EPI Creatinine Equation (2009) CA 19.9 Status: Abnormal Collection Time: 10/07/15 1:49 PM Result Value Ref Range CA 19-9 516 (H) 0 - 35 U/mL Comment: Roche ECLIA  methodology    RADIOLOGY RESULTS: See E-Chart or I-Site for most recent results. Images and reports are reviewed.  Ct Chest W Contrast  10/06/2015 CLINICAL DATA: Pancreatic cancer. EXAM: CT CHEST WITH CONTRAST CT ABDOMEN WITH AND WITHOUT CONTRAST TECHNIQUE: Multidetector CT imaging of the chest was performed during intravenous contrast administration. Multidetector CT imaging of the abdomen and pelvis was performed following the standard protocol before and during bolus administration of intravenous contrast. CONTRAST: 125m ISOVUE-300 IOPAMIDOL (ISOVUE-300) INJECTION 61% COMPARISON: 09/20/2015. FINDINGS: CT CHEST FINDINGS Mediastinum/Nodes: There is no axillary lymphadenopathy. No mediastinal lymphadenopathy. There is no hilar lymphadenopathy. The heart size is normal. No pericardial effusion. The esophagus has normal imaging features. Lungs/Pleura: Centrilobular emphysema noted. 3 mm right upper lobe pulmonary nodule identified image 21 series 8. 5 mm calcified granuloma noted left upper lobe. Subsegmental atelectasis noted lower lobes bilaterally. Musculoskeletal: Bone windows reveal no worrisome lytic or sclerotic osseous lesions. CT ABDOMEN FINDINGS Hepatobiliary: No focal abnormality within the liver parenchyma. There is no evidence for gallstones, gallbladder wall thickening, or pericholecystic fluid. No intrahepatic or extrahepatic biliary dilation. Pancreas: Hypovascular pancreatic body  mass appears slightly more prominent in the interval although reproducible measurement is difficult given the irregular margins of the lesion. The hypovascular area measures 4.0 x 2.7 cm today compared to 3.7 x 2.7 when I remeasure it at the same level and in the same dimensions on the prior study. Spleen: No splenomegaly. No focal mass lesion. Adrenals/Urinary Tract: No adrenal nodule or mass. Stable appearance bilateral renal cysts. Stomach/Bowel: Stomach is nondistended. No gastric wall thickening. No  evidence of outlet obstruction. Duodenum is normally positioned as is the ligament of Treitz. Visualize small bowel of the abdomen is unremarkable. Diverticuli are seen in the abdominal segments of the colon. Vascular/Lymphatic: There is abdominal aortic atherosclerosis without aneurysm. No gastrohepatic ligament lymphadenopathy. 1.4 cm short axis hepatoduodenal ligament lymph node is stable. Tumor encases and attenuates the left gastric artery at its origin. Circumferential tumor encasement of the splenic artery is also evident. Fat plane around the SMA is relatively well preserved although tumor does abut the anterior wall of the proximal SMA. Other: Small volume intraperitoneal free fluid seen adjacent to the liver. Musculoskeletal: Bone windows reveal no worrisome lytic or sclerotic osseous lesions. IMPRESSION: 1. No substantial change in the hypovascular pancreatic lesion consistent with adenocarcinoma. Main duct in the tail the pancreas remains dilated. 2. 3 mm nonspecific right upper lobe pulmonary nodule. Attention on follow-up recommended. 3. Stable 14 mm short axis hepatoduodenal ligament lymph node. This is borderline enlarged and attention on follow-up recommended as metastatic involvement not excluded. Electronically Signed By: Eric Mansell M.D. On: 10/06/2015 16:18  Ct Abdomen Pelvis W Contrast  09/20/2015 CLINICAL DATA: Left lower quadrant abdominal pain for 2 weeks. EXAM: CT ABDOMEN AND PELVIS WITH CONTRAST TECHNIQUE: Multidetector CT imaging of the abdomen and pelvis was performed using the standard protocol following bolus administration of intravenous contrast. CONTRAST: 100mL ISOVUE-300 IOPAMIDOL (ISOVUE-300) INJECTION 61% COMPARISON: CT scan of March 01, 2007. FINDINGS: Severe multilevel degenerative disc disease is noted in the lumbar spine. Visualized lung bases are unremarkable. Status post left total hip arthroplasty. Severe degenerative joint disease of right hip is noted. No  gallstones are noted. The liver and spleen appear normal. 27 x 25 mm ill-defined low density is seen in pancreatic body resulting in severe dilatation of distal pancreatic duct. This is consistent with malignancy. Adrenal glands appear normal. Bilateral renal cysts are noted. No hydronephrosis or renal obstruction is noted. There is no evidence of bowel obstruction. Stool is noted throughout the colon. Atherosclerosis of abdominal aorta is noted without aneurysm formation. Sigmoid diverticulosis is noted without inflammation. Urinary bladder appears normal. No abnormal fluid collection is noted. IMPRESSION: Aortic atherosclerosis. Sigmoid diverticulosis without inflammation. 27 x 25 mm mass seen in pancreatic body resulting in dilatation of pancreatic duct ; this is consistent with pancreatic malignancy. These results will be called to the ordering clinician or representative by the Radiologist Assistant, and communication documented in the PACS or zVision Dashboard. Electronically Signed By: James Green Jr, M.D. On: 09/20/2015 15:07  Ct Abdomen W Wo Contrast  10/06/2015 CLINICAL DATA: Pancreatic cancer. EXAM: CT CHEST WITH CONTRAST CT ABDOMEN WITH AND WITHOUT CONTRAST TECHNIQUE: Multidetector CT imaging of the chest was performed during intravenous contrast administration. Multidetector CT imaging of the abdomen and pelvis was performed following the standard protocol before and during bolus administration of intravenous contrast. CONTRAST: 100mL ISOVUE-300 IOPAMIDOL (ISOVUE-300) INJECTION 61% COMPARISON: 09/20/2015. FINDINGS: CT CHEST FINDINGS Mediastinum/Nodes: There is no axillary lymphadenopathy. No mediastinal lymphadenopathy. There is no hilar lymphadenopathy. The heart size is normal.   No pericardial effusion. The esophagus has normal imaging features. Lungs/Pleura: Centrilobular emphysema noted. 3 mm right upper lobe pulmonary nodule identified image 21 series 8. 5 mm calcified granuloma noted  left upper lobe. Subsegmental atelectasis noted lower lobes bilaterally. Musculoskeletal: Bone windows reveal no worrisome lytic or sclerotic osseous lesions. CT ABDOMEN FINDINGS Hepatobiliary: No focal abnormality within the liver parenchyma. There is no evidence for gallstones, gallbladder wall thickening, or pericholecystic fluid. No intrahepatic or extrahepatic biliary dilation. Pancreas: Hypovascular pancreatic body mass appears slightly more prominent in the interval although reproducible measurement is difficult given the irregular margins of the lesion. The hypovascular area measures 4.0 x 2.7 cm today compared to 3.7 x 2.7 when I remeasure it at the same level and in the same dimensions on the prior study. Spleen: No splenomegaly. No focal mass lesion. Adrenals/Urinary Tract: No adrenal nodule or mass. Stable appearance bilateral renal cysts. Stomach/Bowel: Stomach is nondistended. No gastric wall thickening. No evidence of outlet obstruction. Duodenum is normally positioned as is the ligament of Treitz. Visualize small bowel of the abdomen is unremarkable. Diverticuli are seen in the abdominal segments of the colon. Vascular/Lymphatic: There is abdominal aortic atherosclerosis without aneurysm. No gastrohepatic ligament lymphadenopathy. 1.4 cm short axis hepatoduodenal ligament lymph node is stable. Tumor encases and attenuates the left gastric artery at its origin. Circumferential tumor encasement of the splenic artery is also evident. Fat plane around the SMA is relatively well preserved although tumor does abut the anterior wall of the proximal SMA. Other: Small volume intraperitoneal free fluid seen adjacent to the liver. Musculoskeletal: Bone windows reveal no worrisome lytic or sclerotic osseous lesions. IMPRESSION: 1. No substantial change in the hypovascular pancreatic lesion consistent with adenocarcinoma. Main duct in the tail the pancreas remains dilated. 2. 3 mm nonspecific right upper lobe  pulmonary nodule. Attention on follow-up recommended. 3. Stable 14 mm short axis hepatoduodenal ligament lymph node. This is borderline enlarged and attention on follow-up recommended as metastatic involvement not excluded. Electronically Signed By: Misty Stanley M.D. On: 10/06/2015 16:18     Other Problems Elbert Ewings, CMA; 10/11/2015 11:14 AM) Arthritis Diabetes Mellitus Diverticulosis Enlarged Prostate Gastroesophageal Reflux Disease High blood pressure Inguinal Hernia Kidney Stone Prostate Cancer  Past Surgical History Elbert Ewings, CMA; 10/11/2015 11:14 AM) Appendectomy Colon Removal - Partial Foot Surgery Right. Hip Surgery Left. Open Inguinal Hernia Surgery Bilateral. Oral Surgery Shoulder Surgery Right.  Diagnostic Studies History Elbert Ewings, [place]; 10/11/2015 11:14 AM) Colonoscopy 5-10 years ago  Allergies Elbert Ewings, CMA; 10/11/2015 11:16 AM) Cipro *FLUOROQUINOLONES* Benazepril HCl *CHEMICALS* ACE Inhibitors  Medication History Elbert Ewings, CMA; 10/11/2015 11:17 AM) AmLODIPine Besylate (10MG Tablet, Oral) Active. HydroCHLOROthiazide (12.5MG Capsule, Oral) Active. Pravastatin Sodium (20MG Tablet, Oral) Active. Multiple Vitamin (Oral) Active. Aleve (220MG Capsule, Oral) Active. Aspirin (81MG Tablet, Oral) Active. MetFORMIN HCl (500MG Tablet, Oral) Active. Latanoprost (0.005% Solution, Ophthalmic) Active. Medications Reconciled  Social History Elbert Ewings, [place]; 10/11/2015 11:14 AM) Alcohol use Occasional alcohol use. Caffeine use Coffee. No drug use Tobacco use Former smoker.  Family History Elbert Ewings, [place]; 10/11/2015 11:14 AM) Family history unknown First Degree Relatives    Review of Systems Elbert Ewings CMA; 10/11/2015 11:14 AM) General Not Present- Appetite Loss, Chills, Fatigue, Fever, Night Sweats, Weight Gain and Weight Loss. Skin Not Present- Change in Wart/Mole, Dryness, Hives, Jaundice, New Lesions,  Non-Healing Wounds, Rash and Ulcer. HEENT Present- Wears glasses/contact lenses. Not Present- Earache, Hearing Loss, Hoarseness, Nose Bleed, Oral Ulcers, Ringing in the Ears, Seasonal Allergies, Sinus Pain, Sore Throat,  Visual Disturbances and Yellow Eyes. Respiratory Not Present- Bloody sputum, Chronic Cough, Difficulty Breathing, Snoring and Wheezing. Breast Not Present- Breast Mass, Breast Pain, Nipple Discharge and Skin Changes. Cardiovascular Not Present- Chest Pain, Difficulty Breathing Lying Down, Leg Cramps, Palpitations, Rapid Heart Rate, Shortness of Breath and Swelling of Extremities. Gastrointestinal Present- Abdominal Pain, Excessive gas and Indigestion. Not Present- Bloating, Bloody Stool, Change in Bowel Habits, Chronic diarrhea, Constipation, Difficulty Swallowing, Gets full quickly at meals, Hemorrhoids, Nausea, Rectal Pain and Vomiting. Male Genitourinary Not Present- Blood in Urine, Change in Urinary Stream, Frequency, Impotence, Nocturia, Painful Urination, Urgency and Urine Leakage. Musculoskeletal Present- Back Pain and Swelling of Extremities. Not Present- Joint Pain, Joint Stiffness, Muscle Pain and Muscle Weakness. Neurological Not Present- Decreased Memory, Fainting, Headaches, Numbness, Seizures, Tingling, Tremor, Trouble walking and Weakness. Psychiatric Not Present- Anxiety, Bipolar, Change in Sleep Pattern, Depression, Fearful and Frequent crying. Endocrine Present- New Diabetes. Not Present- Cold Intolerance, Excessive Hunger, Hair Changes, Heat Intolerance and Hot flashes. Hematology Not Present- Blood Thinners, Easy Bruising, Excessive bleeding, Gland problems, HIV and Persistent Infections.  Vitals Elbert Ewings CMA; 10/11/2015 11:17 AM) 10/11/2015 11:17 AM Weight: 129 lb Height: 70in Body Surface Area: 1.73 m Body Mass Index: 18.51 kg/m  Temp.: 97.80F(Temporal)  Pulse: 85 (Regular)  BP: 126/76 (Sitting, Left Arm, Standard)       Assessment &  Plan [name] MD; 10/11/2015 1:07 PM) PRIMARY CANCER OF BODY OF PANCREAS (C25.1) Impression: The patient has a new diagnosis of borderline resectable pancreatic cancer. This is cT2N1M0, stage IIB pancreatic cancer. The mass does not appear to involve the celiac axis directly, but it does involve the left gastric artery and the splenic artery. It appears to be encasing the left gastric artery. It is also abutting the superior mesenteric artery anteriorly. He is going to get chemotherapy and then reassess. I suspect that he likely may not become resectable. If that is the case, we would pursue SBRT.  He also may not elect to have surgery due to risk benefit ratio. He is 28 and gets around well. I suspect that he would become much more frail with surgery.  In any event, I discussed the risks of Port-A-Cath placement with him. I reviewed the risk of leaving, infection, malfunction, possible need for additional surgeries or procedures, possible risk of pneumothorax. He understands and wishes to proceed. We'll make every effort to get that done this week. Current Plans Pt Education - ccs port insertion education Schedule for Surgery   Signed by [name], MD (10/11/2015 1:08 PM)

## 2015-10-12 NOTE — Telephone Encounter (Signed)
Oncology Nurse Navigator Documentation  Oncology Nurse Navigator Flowsheets 10/12/2015  Navigator Location [facility]  Navigator Encounter Type Telephone  Telephone Incoming Call;Outgoing Call;Medication Assistance--was told by pharmacy that scripts need to be resubmitted and they need EMLA cream  Abnormal Finding Date -  Confirmed Diagnosis Date -  Patient Visit Type -  Treatment Phase Pre-Tx/Tx Discussion  Barriers/Navigation Needs Family concerns  Education Other  Interventions Medication assistance-Zofran needs PA from insurance  Referrals -  Coordination of Care -  Education Method Verbal-informed wife of PA process. She will be called by pharmacy when ready  Support Groups/Services -  Acuity -  Time Spent with Patient Forest the phone # for triage to send the PA request instead of main fax #. Notified Juliann Pulse in triage to look for this and forward to MD.

## 2015-10-13 ENCOUNTER — Ambulatory Visit (HOSPITAL_COMMUNITY): Payer: Medicare Other | Admitting: Certified Registered Nurse Anesthetist

## 2015-10-13 ENCOUNTER — Telehealth: Payer: Self-pay | Admitting: *Deleted

## 2015-10-13 ENCOUNTER — Ambulatory Visit (HOSPITAL_COMMUNITY): Payer: Medicare Other

## 2015-10-13 ENCOUNTER — Encounter: Payer: Self-pay | Admitting: Hematology

## 2015-10-13 ENCOUNTER — Other Ambulatory Visit: Payer: Self-pay

## 2015-10-13 ENCOUNTER — Encounter (HOSPITAL_COMMUNITY)
Admission: RE | Disposition: A | Discharge status: Home / Self Care | Payer: Self-pay | Source: Ambulatory Visit | Attending: General Surgery

## 2015-10-13 ENCOUNTER — Encounter (HOSPITAL_COMMUNITY): Payer: Self-pay | Admitting: *Deleted

## 2015-10-13 ENCOUNTER — Ambulatory Visit (HOSPITAL_COMMUNITY)
Admission: RE | Admit: 2015-10-13 | Discharge: 2015-10-13 | Disposition: A | Discharge status: Home / Self Care | Payer: Medicare Other | Source: Ambulatory Visit | Attending: General Surgery | Admitting: General Surgery

## 2015-10-13 ENCOUNTER — Encounter: Payer: Medicare Other | Admitting: Nutrition

## 2015-10-13 ENCOUNTER — Other Ambulatory Visit: Payer: Medicare Other

## 2015-10-13 DIAGNOSIS — M1990 Unspecified osteoarthritis, unspecified site: Secondary | ICD-10-CM | POA: Diagnosis not present

## 2015-10-13 DIAGNOSIS — J449 Chronic obstructive pulmonary disease, unspecified: Secondary | ICD-10-CM | POA: Insufficient documentation

## 2015-10-13 DIAGNOSIS — Z791 Long term (current) use of non-steroidal anti-inflammatories (NSAID): Secondary | ICD-10-CM | POA: Diagnosis not present

## 2015-10-13 DIAGNOSIS — Z87891 Personal history of nicotine dependence: Secondary | ICD-10-CM | POA: Diagnosis not present

## 2015-10-13 DIAGNOSIS — Z95828 Presence of other vascular implants and grafts: Secondary | ICD-10-CM

## 2015-10-13 DIAGNOSIS — I10 Essential (primary) hypertension: Secondary | ICD-10-CM | POA: Insufficient documentation

## 2015-10-13 DIAGNOSIS — Z8546 Personal history of malignant neoplasm of prostate: Secondary | ICD-10-CM | POA: Diagnosis not present

## 2015-10-13 DIAGNOSIS — C259 Malignant neoplasm of pancreas, unspecified: Secondary | ICD-10-CM | POA: Diagnosis not present

## 2015-10-13 DIAGNOSIS — Z7984 Long term (current) use of oral hypoglycemic drugs: Secondary | ICD-10-CM | POA: Insufficient documentation

## 2015-10-13 DIAGNOSIS — Z79899 Other long term (current) drug therapy: Secondary | ICD-10-CM | POA: Diagnosis not present

## 2015-10-13 DIAGNOSIS — Z7982 Long term (current) use of aspirin: Secondary | ICD-10-CM | POA: Diagnosis not present

## 2015-10-13 DIAGNOSIS — E119 Type 2 diabetes mellitus without complications: Secondary | ICD-10-CM | POA: Insufficient documentation

## 2015-10-13 DIAGNOSIS — C251 Malignant neoplasm of body of pancreas: Secondary | ICD-10-CM | POA: Insufficient documentation

## 2015-10-13 HISTORY — DX: Benign prostatic hyperplasia without lower urinary tract symptoms: N40.0

## 2015-10-13 HISTORY — PX: PORTACATH PLACEMENT: SHX2246

## 2015-10-13 LAB — GLUCOSE, CAPILLARY
GLUCOSE-CAPILLARY: 115 mg/dL — AB (ref 65–99)
Glucose-Capillary: 129 mg/dL — ABNORMAL HIGH (ref 65–99)

## 2015-10-13 SURGERY — INSERTION, TUNNELED CENTRAL VENOUS DEVICE, WITH PORT
Anesthesia: General | Site: Chest | Laterality: Left

## 2015-10-13 MED ORDER — BUPIVACAINE-EPINEPHRINE 0.25% -1:200000 IJ SOLN
INTRAMUSCULAR | Status: DC | PRN
  Administered 2015-10-13: 5 mL

## 2015-10-13 MED ORDER — LIDOCAINE HCL 1 % IJ SOLN
INTRAMUSCULAR | Status: DC | PRN
Start: 2015-10-13 — End: 2015-10-13
  Administered 2015-10-13: 5 mL

## 2015-10-13 MED ORDER — CHLORHEXIDINE GLUCONATE CLOTH 2 % EX PADS: 6.0000 | MEDICATED_PAD | Freq: Once | CUTANEOUS | Status: DC

## 2015-10-13 MED ORDER — MEPERIDINE HCL 50 MG/ML IJ SOLN: 6.2500 mg | INTRAMUSCULAR | Status: DC | PRN

## 2015-10-13 MED ORDER — CEFAZOLIN SODIUM-DEXTROSE 2-4 GM/100ML-% IV SOLN
INTRAVENOUS | Status: AC
  Filled 2015-10-13: qty 100

## 2015-10-13 MED ORDER — MIDAZOLAM HCL 2 MG/2ML IJ SOLN: 0.5000 mg | Freq: Once | INTRAMUSCULAR | Status: DC | PRN

## 2015-10-13 MED ORDER — 0.9 % SODIUM CHLORIDE (POUR BTL) OPTIME
TOPICAL | Status: DC | PRN
  Administered 2015-10-13: 1000 mL

## 2015-10-13 MED ORDER — BUPIVACAINE-EPINEPHRINE 0.25% -1:200000 IJ SOLN
INTRAMUSCULAR | Status: AC
  Filled 2015-10-13: qty 1

## 2015-10-13 MED ORDER — LIDOCAINE HCL (CARDIAC) 20 MG/ML IV SOLN
INTRAVENOUS | Status: DC | PRN
  Administered 2015-10-13: 75 mg via INTRAVENOUS

## 2015-10-13 MED ORDER — HEPARIN SOD (PORK) LOCK FLUSH 100 UNIT/ML IV SOLN
INTRAVENOUS | Status: AC
  Filled 2015-10-13: qty 5

## 2015-10-13 MED ORDER — ONDANSETRON HCL 4 MG/2ML IJ SOLN
INTRAMUSCULAR | Status: DC | PRN
  Administered 2015-10-13: 4 mg via INTRAVENOUS

## 2015-10-13 MED ORDER — LIDOCAINE HCL 1 % IJ SOLN
INTRAMUSCULAR | Status: AC
Start: 2015-10-13 — End: 2015-10-13
  Filled 2015-10-13: qty 20

## 2015-10-13 MED ORDER — FENTANYL CITRATE (PF) 100 MCG/2ML IJ SOLN
INTRAMUSCULAR | Status: AC
  Filled 2015-10-13: qty 2

## 2015-10-13 MED ORDER — HEPARIN SOD (PORK) LOCK FLUSH 100 UNIT/ML IV SOLN
INTRAVENOUS | Status: DC | PRN
  Administered 2015-10-13: 500 [IU]

## 2015-10-13 MED ORDER — HEPARIN SODIUM (PORCINE) 5000 UNIT/ML IJ SOLN
Freq: Once | INTRAMUSCULAR | Status: AC
  Administered 2015-10-13: 11:00:00
  Filled 2015-10-13: qty 1.2

## 2015-10-13 MED ORDER — PROPOFOL 10 MG/ML IV BOLUS
INTRAVENOUS | Status: AC
  Filled 2015-10-13: qty 20

## 2015-10-13 MED ORDER — FENTANYL CITRATE (PF) 100 MCG/2ML IJ SOLN
INTRAMUSCULAR | Status: DC | PRN
  Administered 2015-10-13: 50 ug via INTRAVENOUS
  Administered 2015-10-13 (×2): 25 ug via INTRAVENOUS

## 2015-10-13 MED ORDER — HYDROCODONE-ACETAMINOPHEN 7.5-325 MG PO TABS: 1.0000 | ORAL_TABLET | Freq: Once | ORAL | Status: DC | PRN

## 2015-10-13 MED ORDER — PROMETHAZINE HCL 25 MG/ML IJ SOLN: 6.2500 mg | INTRAMUSCULAR | Status: DC | PRN

## 2015-10-13 MED ORDER — CEFAZOLIN SODIUM-DEXTROSE 2-4 GM/100ML-% IV SOLN
2.0000 g | INTRAVENOUS | Status: AC
  Administered 2015-10-13: 2 g via INTRAVENOUS

## 2015-10-13 MED ORDER — MIDAZOLAM HCL 2 MG/2ML IJ SOLN
INTRAMUSCULAR | Status: AC
  Filled 2015-10-13: qty 2

## 2015-10-13 MED ORDER — MIDAZOLAM HCL 5 MG/5ML IJ SOLN
INTRAMUSCULAR | Status: DC | PRN
  Administered 2015-10-13: 2 mg via INTRAVENOUS

## 2015-10-13 MED ORDER — LACTATED RINGERS IV SOLN
INTRAVENOUS | Status: DC
  Administered 2015-10-13: 1000 mL via INTRAVENOUS

## 2015-10-13 MED ORDER — PROPOFOL 10 MG/ML IV BOLUS
INTRAVENOUS | Status: DC | PRN
  Administered 2015-10-13: 150 mg via INTRAVENOUS

## 2015-10-13 MED ORDER — OXYCODONE HCL 5 MG PO TABS: 5.0000 mg | ORAL_TABLET | Freq: Four times a day (QID) | ORAL | Status: DC | PRN

## 2015-10-13 SURGICAL SUPPLY — 38 items
ADH SKN CLS APL DERMABOND .7 (GAUZE/BANDAGES/DRESSINGS) ×1
BAG DECANTER FOR FLEXI CONT (MISCELLANEOUS) ×3 IMPLANT
BLADE HEX COATED 2.75 (ELECTRODE) ×3 IMPLANT
BLADE SURG 15 STRL LF DISP TIS (BLADE) ×1 IMPLANT
BLADE SURG 15 STRL SS (BLADE) ×3
BLADE SURG SZ11 CARB STEEL (BLADE) ×3 IMPLANT
CHLORAPREP W/TINT 26ML (MISCELLANEOUS) ×3 IMPLANT
COVER SURGICAL LIGHT HANDLE (MISCELLANEOUS) ×3 IMPLANT
DECANTER SPIKE VIAL GLASS SM (MISCELLANEOUS) ×3 IMPLANT
DERMABOND ADVANCED (GAUZE/BANDAGES/DRESSINGS) ×2
DERMABOND ADVANCED .7 DNX12 (GAUZE/BANDAGES/DRESSINGS) IMPLANT
DRAPE C-ARM 42X120 X-RAY (DRAPES) ×3 IMPLANT
DRAPE LAPAROTOMY TRNSV 102X78 (DRAPE) ×3 IMPLANT
DRAPE UTILITY XL STRL (DRAPES) ×3 IMPLANT
DRSG TEGADERM 4X4.75 (GAUZE/BANDAGES/DRESSINGS) ×2 IMPLANT
ELECT PENCIL ROCKER SW 15FT (MISCELLANEOUS) ×3 IMPLANT
ELECT REM PT RETURN 9FT ADLT (ELECTROSURGICAL) ×3
ELECTRODE REM PT RTRN 9FT ADLT (ELECTROSURGICAL) ×1 IMPLANT
GAUZE SPONGE 4X4 12PLY STRL (GAUZE/BANDAGES/DRESSINGS) ×2 IMPLANT
GAUZE SPONGE 4X4 16PLY XRAY LF (GAUZE/BANDAGES/DRESSINGS) ×3 IMPLANT
GLOVE BIO SURGEON STRL SZ 6 (GLOVE) ×3 IMPLANT
GLOVE INDICATOR 6.5 STRL GRN (GLOVE) ×3 IMPLANT
GOWN STRL REUS W/TWL 2XL LVL3 (GOWN DISPOSABLE) ×3 IMPLANT
GOWN STRL REUS W/TWL XL LVL3 (GOWN DISPOSABLE) ×3 IMPLANT
KIT BASIN OR (CUSTOM PROCEDURE TRAY) ×3 IMPLANT
KIT PORT POWER 8FR ISP CVUE (Catheter) ×2 IMPLANT
LIQUID BAND (GAUZE/BANDAGES/DRESSINGS) ×3 IMPLANT
NEEDLE HYPO 22GX1.5 SAFETY (NEEDLE) ×3 IMPLANT
PACK BASIC VI WITH GOWN DISP (CUSTOM PROCEDURE TRAY) ×3 IMPLANT
SUT MNCRL AB 4-0 PS2 18 (SUTURE) ×3 IMPLANT
SUT PROLENE 2 0 SH DA (SUTURE) ×6 IMPLANT
SUT VIC AB 3-0 SH 27 (SUTURE) ×3
SUT VIC AB 3-0 SH 27X BRD (SUTURE) ×1 IMPLANT
SYR CONTROL 10ML LL (SYRINGE) ×3 IMPLANT
SYRINGE 10CC LL (SYRINGE) ×3 IMPLANT
TOWEL OR 17X26 10 PK STRL BLUE (TOWEL DISPOSABLE) ×3 IMPLANT
TOWEL OR NON WOVEN STRL DISP B (DISPOSABLE) ×3 IMPLANT
YANKAUER SUCT BULB TIP 10FT TU (MISCELLANEOUS) IMPLANT

## 2015-10-13 NOTE — Anesthesia Preprocedure Evaluation (Addendum)
Anesthesia Evaluation  Patient identified by MRN, date of birth, ID band Patient awake    Reviewed: Allergy & Precautions, NPO status , Patient's Chart, lab work & pertinent test results  History of Anesthesia Complications Negative for: history of anesthetic complications  Airway Mallampati: I  TM Distance: >3 FB Neck ROM: Full    Dental  (+) Dental Advisory Given   Pulmonary COPD, former smoker,    breath sounds clear to auscultation       Cardiovascular hypertension, Pt. on medications (-) angina Rhythm:Regular Rate:Normal     Neuro/Psych negative neurological ROS     GI/Hepatic Neg liver ROS, GERD  Controlled,Pancreatic cancer   Endo/Other  diabetes (glu 129)  Renal/GU negative Renal ROS     Musculoskeletal   Abdominal   Peds  Hematology   Anesthesia Other Findings   Reproductive/Obstetrics                            Anesthesia Physical Anesthesia Plan  ASA: III  Anesthesia Plan: General   Post-op Pain Management:    Induction: Intravenous  Airway Management Planned: LMA  Additional Equipment:   Intra-op Plan:   Post-operative Plan:   Informed Consent: I have reviewed the patients History and Physical, chart, labs and discussed the procedure including the risks, benefits and alternatives for the proposed anesthesia with the patient or authorized representative who has indicated his/her understanding and acceptance.   Dental advisory given  Plan Discussed with: CRNA and Surgeon  Anesthesia Plan Comments: (Plan routine monitors, GA- LMA OK)        Anesthesia Quick Evaluation

## 2015-10-13 NOTE — Anesthesia Procedure Notes (Signed)
Procedure Name: LMA Insertion Date/Time: 10/13/2015 10:47 AM Performed by: Lollie Sails Pre-anesthesia Checklist: Patient identified, Emergency Drugs available, Suction available, Patient being monitored and Timeout performed Patient Re-evaluated:Patient Re-evaluated prior to inductionOxygen Delivery Method: Circle system utilized Preoxygenation: Pre-oxygenation with 100% oxygen Intubation Type: IV induction Ventilation: Mask ventilation without difficulty LMA: LMA inserted LMA Size: 5.0 Number of attempts: 1 Placement Confirmation: positive ETCO2 and breath sounds checked- equal and bilateral Tube secured with: Tape Dental Injury: Teeth and Oropharynx as per pre-operative assessment

## 2015-10-13 NOTE — Telephone Encounter (Signed)
Oncology Nurse Navigator Documentation  Oncology Nurse Navigator Flowsheets 10/13/2015  Navigator Location [facility]  Navigator Encounter Type Telephone  Telephone Incoming Call;Medication Assistance  Abnormal Finding Date -  Confirmed Diagnosis Date -  Patient Visit Type -  Treatment Phase -  Barriers/Navigation Needs -Pharmacy left VM that fax #s given return as "busy". Provided with information needed to obtain PA for his Zofran  Education -  Interventions Other--forwarded this to managed care.  Referrals -  Coordination of Care -Phone # for PA [phone] and his JL:[id number]. Requests return call when PA is completed.  Education Method -  Support Groups/Services -  Acuity -  Time Spent with Patient 15

## 2015-10-13 NOTE — Discharge Instructions (Addendum)
[place] Office Phone Number [phone]   POST OP INSTRUCTIONS  Always review your discharge instruction sheet given to you by the facility where your surgery was performed.  IF YOU HAVE DISABILITY OR FAMILY LEAVE FORMS, YOU MUST BRING THEM TO THE OFFICE FOR PROCESSING.  DO NOT GIVE THEM TO YOUR DOCTOR.  1. A prescription for pain medication may be given to you upon discharge.  Take your pain medication as prescribed, if needed.  If narcotic pain medicine is not needed, then you may take acetaminophen (Tylenol) or ibuprofen (Advil) as needed. 2. Take your usually prescribed medications unless otherwise directed 3. If you need a refill on your pain medication, please contact your pharmacy.  They will contact our office to request authorization.  Prescriptions will not be filled after 5pm or on week-ends. 4. You should eat very light the first 24 hours after surgery, such as soup, crackers, pudding, etc.  Resume your normal diet the day after surgery 5. It is common to experience some constipation if taking pain medication after surgery.  Increasing fluid intake and taking a stool softener will usually help or prevent this problem from occurring.  A mild laxative (Milk of Magnesia or Miralax) should be taken according to package directions if there are no bowel movements after 48 hours. 6. You may shower in 48 hours.  The surgical glue will flake off in 2-3 weeks.   7. ACTIVITIES:  No strenuous activity or heavy lifting for 1 week.   a. You may drive when you no longer are taking prescription pain medication, you can comfortably wear a seatbelt, and you can safely maneuver your car and apply brakes. b. RETURN TO WORK:  __________to be determined._______________ Dennis Bast should see your doctor in the office for a follow-up appointment approximately three-four weeks after your surgery.    WHEN TO CALL YOUR DOCTOR: 1. Fever over 101.0 2. Nausea and/or vomiting. 3. Extreme swelling  or bruising. 4. Continued bleeding from incision. 5. Increased pain, redness, or drainage from the incision.  The clinic staff is available to answer your questions during regular business hours.  Please dont hesitate to call and ask to speak to one of the nurses for clinical concerns.  If you have a medical emergency, go to the nearest emergency room or call 911.  A surgeon from [facility] is always on call at the hospital.  For further questions, please visit [email or web address]      General Anesthesia, Adult, Care After Refer to this sheet in the next few weeks. These instructions provide you with information on caring for yourself after your procedure. Your health care provider may also give you more specific instructions. Your treatment has been planned according to current medical practices, but problems sometimes occur. Call your health care provider if you have any problems or questions after your procedure. WHAT TO EXPECT AFTER THE PROCEDURE After the procedure, it is typical to experience:  Sleepiness.  Nausea and vomiting. HOME CARE INSTRUCTIONS  For the first 24 hours after general anesthesia:  Have a responsible person with you.  Do not drive a car. If you are alone, do not take public transportation.  Do not drink alcohol.  Do not take medicine that has not been prescribed by your health care provider.  Do not sign important papers or make important decisions.  You may resume a normal diet and activities as directed by your health care provider.  If you have questions or problems that seem related  to general anesthesia, call the hospital and ask for the anesthetist or anesthesiologist on call. SEEK MEDICAL CARE IF:  You have nausea and vomiting that continue the day after anesthesia.  You develop a rash. SEEK IMMEDIATE MEDICAL CARE IF:   You have difficulty breathing.  You have chest pain.  You have any allergic problems.   This  information is not intended to replace advice given to you by your health care provider. Make sure you discuss any questions you have with your health care provider.   Document Released: 06/19/2000 Document Revised: 04/03/2014 Document Reviewed: 07/12/2011 Elsevier Interactive Patient Education [employer].

## 2015-10-13 NOTE — Transfer of Care (Signed)
Immediate Anesthesia Transfer of Care Note  Patient: [name]  Procedure(s) Performed: Procedure(s): INSERTION PORT-A-CATH (Left)  Patient Location: PACU  Anesthesia Type:General  Level of Consciousness: awake, sedated and responds to stimulation  Airway & Oxygen Therapy: Patient Spontanous Breathing and Patient connected to face mask oxygen  Post-op Assessment: Report given to RN and Post -op Vital signs reviewed and stable  Post vital signs: Reviewed and stable  Last Vitals:  Filed Vitals:   10/13/15 0900  BP: 142/73  Pulse: 89  Temp: 36.7 C  Resp: 18    Last Pain: There were no vitals filed for this visit.       Complications: No apparent anesthesia complications

## 2015-10-13 NOTE — Anesthesia Postprocedure Evaluation (Signed)
Anesthesia Post Note  Patient: [name]  Procedure(s) Performed: Procedure(s) (LRB): INSERTION PORT-A-CATH (Left)  Patient location during evaluation: PACU Anesthesia Type: General Level of consciousness: awake and alert, oriented and patient cooperative Pain management: pain level controlled Vital Signs Assessment: post-procedure vital signs reviewed and stable Respiratory status: spontaneous breathing, nonlabored ventilation and respiratory function stable Cardiovascular status: blood pressure returned to baseline and stable Postop Assessment: no signs of nausea or vomiting Anesthetic complications: no    Last Vitals:  Filed Vitals:   10/13/15 1254 10/13/15 1320  BP: 146/66 136/67  Pulse: 77 81  Temp: 36.4 C   Resp: 16 16    Last Pain:  Filed Vitals:   10/13/15 1415  PainSc: 0-No pain                 Lyndsay Talamante,E. Harshitha Fretz

## 2015-10-13 NOTE — Op Note (Signed)
PREOPERATIVE DIAGNOSIS:  Pancreatic body cancer, cT2N1     POSTOPERATIVE DIAGNOSIS:  Same     PROCEDURE: Left subclavian port placement, Bard ClearVue  Power Port, MRI safe, 8-French.      SURGEON:  [name], MD      ANESTHESIA:  General   FINDINGS:  Good venous return, easy flush, and tip of the catheter and   SVC 27.5 cm.      SPECIMEN:  None.      ESTIMATED BLOOD LOSS:  Minimal.      COMPLICATIONS:  None known.      PROCEDURE:  Pt was identified in the holding area and taken to   the operating room, where patient was placed supine on the operating room   table.  General anesthesia was induced.  Patient's arms were tucked and the upper   chest and neck were prepped and draped in sterile fashion.  Time-out was   performed according to the surgical safety check list.  When all was   correct, we continued.   Local anesthetic was administered over this   area at the angle of the clavicle.  The vein was accessed with 2 passes of the needle. There was good venous return and the wire passed easily with no ectopy.   Fluoroscopy was used to confirm that the wire was in the vena cava.      The patient was placed back level and the area for the pocket was anethetized   with local anesthetic.  A 3-cm transverse incision was made with a #15   blade.  Cautery was used to divide the subcutaneous tissues down to the   pectoralis muscle.  An [employer] retractor was used to elevate the skin   while a pocket was created on top of the pectoralis fascia.  The port   was placed into the pocket to confirm that it was of adequate size.  The   catheter was preattached to the port.  The port was then secured to the   pectoralis fascia with four 2-0 Prolene sutures.  These were clamped and   not tied down yet.    The catheter was tunneled through to the wire exit   site.  The catheter was placed along the wire to determine what length it should be to be in the SVC.  The catheter was cut at 27.5  cm.  The tunneler sheath and dilator were passed over the wire and the dilator and wire were removed.  The catheter was advanced through the tunneler sheath and the tunneler sheath was pulled away.  Care was taken to keep the catheter in the tunneler sheath as this occurred. This was advanced and the tunneler sheath was removed.  There was good venous   return and easy flush of the catheter.  The Prolene sutures were tied   down to the pectoral fascia.  The skin was reapproximated using 3-0   Vicryl interrupted deep dermal sutures.    Fluoroscopy was used to re-confirm good position of the catheter.  The skin   was then closed using 4-0 Monocryl in a subcuticular fashion.  The port was flushed with concentrated heparin flush as well.  The wounds were then cleaned, dried, and dressed with Dermabond.  The patient was awakened from anesthesia and taken to the PACU in stable condition.  Needle, sponge, and instrument counts were correct.               [name], MD

## 2015-10-13 NOTE — Progress Notes (Signed)
X-ray results noted 

## 2015-10-13 NOTE — H&P (View-Only) (Signed)
[name] 10/11/2015 11:14 AM Location: [facility] Patient #: [id number] DOB: 04/08/1935 Married / Language: English / Race: Black or African American Male   History of Present Illness ([name] Krysiak MD; 10/11/2015 1:04 PM) The patient is a 79 year old male who presents with pancreatic cancer. Patient is a 79-year-old male referred by Dr. Feng for consultation regarding his new pancreatic cancer. The patient presented with a month of abdominal pain radiating to the back. He also was having a little bit more fatigued than normal. He is retired from working in the [place], [place]. Is able to manage his own yard work and bike several miles a day. He denies any weight loss. He is usually able to control his abdominal pain with Aleve, but this is the more difficult. He had a CT scan that demonstrated a pancreatic body mass. He was subsequently referred to Dr. Jacobs for endoscopic ultrasound. Fine-needle aspiration showed adenocarcinoma. He is a survivor from prostate cancer. He is also diabetic. He has been previously on metformin but now is managing with diet alone. He denies change in his appetite or diarrhea.  Family history is significant for hypertension in his father as well as kidney disease. Has no family history of cancer.    LABORATORY RESULTS: Available labs are reviewed  Recent Results (from the past 2160 hour(s)) Comprehensive metabolic panel Status: Abnormal Collection Time: 09/07/15 3:13 PM Result Value Ref Range Sodium 144 135 - 145 mEq/L Potassium 3.4 (L) 3.5 - 5.1 mEq/L Chloride 103 96 - 112 mEq/L CO2 32 19 - 32 mEq/L Glucose, Bld 191 (H) 70 - 99 mg/dL BUN 24 (H) 6 - 23 mg/dL Creatinine, Ser 1.26 0.40 - 1.50 mg/dL Total Bilirubin 0.5 0.2 - 1.2 mg/dL Alkaline Phosphatase 60 39 - 117 U/L AST 23 0 - 37 U/L ALT 19 0 - 53 U/L Total Protein 7.0 6.0 - 8.3 g/dL Albumin 4.2 3.5 - 5.2 g/dL Calcium 9.4 8.4 - 10.5 mg/dL GFR 71.26 >60.00  mL/min CBC with Differential/Platelet Status: Abnormal Collection Time: 09/07/15 3:13 PM Result Value Ref Range WBC 6.2 4.0 - 10.5 K/uL RBC 5.20 4.22 - 5.81 Mil/uL Hemoglobin 14.1 13.0 - 17.0 g/dL HCT 42.1 39.0 - 52.0 % MCV 81.0 78.0 - 100.0 fl MCHC 33.4 30.0 - 36.0 g/dL RDW 14.5 11.5 - 15.5 % Platelets 178.0 150.0 - 400.0 K/uL Neutrophils Relative % 61.4 43.0 - 77.0 % Lymphocytes Relative 20.8 12.0 - 46.0 % Monocytes Relative 10.0 3.0 - 12.0 % Eosinophils Relative 7.5 (H) 0.0 - 5.0 % Basophils Relative 0.3 0.0 - 3.0 % Neutro Abs 3.8 1.4 - 7.7 K/uL Lymphs Abs 1.3 0.7 - 4.0 K/uL Monocytes Absolute 0.6 0.1 - 1.0 K/uL Eosinophils Absolute 0.5 0.0 - 0.7 K/uL Basophils Absolute 0.0 0.0 - 0.1 K/uL Lipase Status: None Collection Time: 09/07/15 3:13 PM Result Value Ref Range Lipase 37.0 11.0 - 59.0 U/L Amylase Status: None Collection Time: 09/07/15 3:13 PM Result Value Ref Range Amylase 97 27 - 131 U/L Urinalysis, Routine w reflex microscopic Status: Abnormal Collection Time: 09/07/15 3:13 PM Result Value Ref Range Color, Urine YELLOW Yellow;Lt. Yellow APPearance CLEAR Clear Specific Gravity, Urine 1.020 1.000-1.030 pH 6.0 5.0 - 8.0 Total Protein, Urine TRACE (A) Negative Urine Glucose NEGATIVE Negative Ketones, ur TRACE (A) Negative Bilirubin Urine NEGATIVE Negative Hgb urine dipstick NEGATIVE Negative Urobilinogen, UA 0.2 0.0 - 1.0 Leukocytes, UA NEGATIVE Negative Nitrite NEGATIVE Negative WBC, UA 3-6/hpf (A) 0-2/hpf RBC / HPF 0-2/hpf 0-2/hpf Urine Culture Status: None Collection   Time: 09/07/15 3:13 PM Result Value Ref Range Colony Count 2,000 COLONIES/ML Organism ID, Bacteria Insignificant Growth Urine Microalbumin w/creat. ratio Status: Abnormal Collection Time: 09/07/15 3:13 PM Result Value Ref Range Microalb, Ur 5.3 (H) 0.0 - 1.9 mg/dL Creatinine,U 224.7 mg/dL Microalb Creat Ratio 2.4 0.0 - 30.0 mg/g Cancer antigen 19-9 Status:  Abnormal Collection Time: 09/30/15 6:36 AM Result Value Ref Range CA 19-9 370 (H) 0 - 35 U/mL Comment: (NOTE) Roche ECLIA methodology Performed At: [facility] [address] , [place] [id number] [place] William F MD Ph:[phone] Glucose, capillary Status: Abnormal Collection Time: 09/30/15 6:53 AM Result Value Ref Range Glucose-Capillary 152 (H) 65 - 99 mg/dL CBC with Differential Status: None Collection Time: 10/07/15 1:49 PM Result Value Ref Range WBC 7.0 4.0 - 10.3 10e3/uL NEUT# 4.9 1.5 - 6.5 10e3/uL HGB 14.4 13.0 - 17.1 g/dL HCT 42.6 38.4 - 49.9 % Platelets 186 140 - 400 10e3/uL MCV 80.7 79.3 - 98.0 fL MCH 27.3 27.2 - 33.4 pg MCHC 33.8 32.0 - 36.0 g/dL RBC 5.28 4.20 - 5.82 10e6/uL RDW 14.2 11.0 - 14.6 % lymph# 1.2 0.9 - 3.3 10e3/uL MONO# 0.8 0.1 - 0.9 10e3/uL Eosinophils Absolute 0.1 0.0 - 0.5 10e3/uL Basophils Absolute 0.0 0.0 - 0.1 10e3/uL NEUT% 69.7 39.0 - 75.0 % LYMPH% 17.7 14.0 - 49.0 % MONO% 10.7 0.0 - 14.0 % EOS% 1.3 0.0 - 7.0 % BASO% 0.6 0.0 - 2.0 % Comprehensive metabolic panel Status: Abnormal Collection Time: 10/07/15 1:49 PM Result Value Ref Range Sodium 141 136 - 145 mEq/L Potassium 3.6 3.5 - 5.1 mEq/L Chloride 102 98 - 109 mEq/L CO2 28 22 - 29 mEq/L Glucose 137 70 - 140 mg/dl Comment: Glucose reference range is for nonfasting patients. Fasting glucose reference range is 70- 100. BUN 22.9 7.0 - 26.0 mg/dL Creatinine 1.3 0.7 - 1.3 mg/dL Total Bilirubin 0.47 0.20 - 1.20 mg/dL Alkaline Phosphatase 78 40 - 150 U/L AST 25 5 - 34 U/L ALT 19 0 - 55 U/L Total Protein 7.6 6.4 - 8.3 g/dL Albumin 3.8 3.5 - 5.0 g/dL Calcium 9.5 8.4 - 10.4 mg/dL Anion Gap 11 3 - 11 mEq/L EGFR 61 (L) >90 ml/min/1.73 m2 Comment: eGFR is calculated using the CKD-EPI Creatinine Equation (2009) CA 19.9 Status: Abnormal Collection Time: 10/07/15 1:49 PM Result Value Ref Range CA 19-9 516 (H) 0 - 35 U/mL Comment: Roche ECLIA  methodology    RADIOLOGY RESULTS: See E-Chart or I-Site for most recent results. Images and reports are reviewed.  Ct Chest W Contrast  10/06/2015 CLINICAL DATA: Pancreatic cancer. EXAM: CT CHEST WITH CONTRAST CT ABDOMEN WITH AND WITHOUT CONTRAST TECHNIQUE: Multidetector CT imaging of the chest was performed during intravenous contrast administration. Multidetector CT imaging of the abdomen and pelvis was performed following the standard protocol before and during bolus administration of intravenous contrast. CONTRAST: 100mL ISOVUE-300 IOPAMIDOL (ISOVUE-300) INJECTION 61% COMPARISON: 09/20/2015. FINDINGS: CT CHEST FINDINGS Mediastinum/Nodes: There is no axillary lymphadenopathy. No mediastinal lymphadenopathy. There is no hilar lymphadenopathy. The heart size is normal. No pericardial effusion. The esophagus has normal imaging features. Lungs/Pleura: Centrilobular emphysema noted. 3 mm right upper lobe pulmonary nodule identified image 21 series 8. 5 mm calcified granuloma noted left upper lobe. Subsegmental atelectasis noted lower lobes bilaterally. Musculoskeletal: Bone windows reveal no worrisome lytic or sclerotic osseous lesions. CT ABDOMEN FINDINGS Hepatobiliary: No focal abnormality within the liver parenchyma. There is no evidence for gallstones, gallbladder wall thickening, or pericholecystic fluid. No intrahepatic or extrahepatic biliary dilation. Pancreas: Hypovascular pancreatic body   mass appears slightly more prominent in the interval although reproducible measurement is difficult given the irregular margins of the lesion. The hypovascular area measures 4.0 x 2.7 cm today compared to 3.7 x 2.7 when I remeasure it at the same level and in the same dimensions on the prior study. Spleen: No splenomegaly. No focal mass lesion. Adrenals/Urinary Tract: No adrenal nodule or mass. Stable appearance bilateral renal cysts. Stomach/Bowel: Stomach is nondistended. No gastric wall thickening. No  evidence of outlet obstruction. Duodenum is normally positioned as is the ligament of Treitz. Visualize small bowel of the abdomen is unremarkable. Diverticuli are seen in the abdominal segments of the colon. Vascular/Lymphatic: There is abdominal aortic atherosclerosis without aneurysm. No gastrohepatic ligament lymphadenopathy. 1.4 cm short axis hepatoduodenal ligament lymph node is stable. Tumor encases and attenuates the left gastric artery at its origin. Circumferential tumor encasement of the splenic artery is also evident. Fat plane around the SMA is relatively well preserved although tumor does abut the anterior wall of the proximal SMA. Other: Small volume intraperitoneal free fluid seen adjacent to the liver. Musculoskeletal: Bone windows reveal no worrisome lytic or sclerotic osseous lesions. IMPRESSION: 1. No substantial change in the hypovascular pancreatic lesion consistent with adenocarcinoma. Main duct in the tail the pancreas remains dilated. 2. 3 mm nonspecific right upper lobe pulmonary nodule. Attention on follow-up recommended. 3. Stable 14 mm short axis hepatoduodenal ligament lymph node. This is borderline enlarged and attention on follow-up recommended as metastatic involvement not excluded. Electronically Signed By: Eric Mansell M.D. On: 10/06/2015 16:18  Ct Abdomen Pelvis W Contrast  09/20/2015 CLINICAL DATA: Left lower quadrant abdominal pain for 2 weeks. EXAM: CT ABDOMEN AND PELVIS WITH CONTRAST TECHNIQUE: Multidetector CT imaging of the abdomen and pelvis was performed using the standard protocol following bolus administration of intravenous contrast. CONTRAST: 100mL ISOVUE-300 IOPAMIDOL (ISOVUE-300) INJECTION 61% COMPARISON: CT scan of March 01, 2007. FINDINGS: Severe multilevel degenerative disc disease is noted in the lumbar spine. Visualized lung bases are unremarkable. Status post left total hip arthroplasty. Severe degenerative joint disease of right hip is noted. No  gallstones are noted. The liver and spleen appear normal. 27 x 25 mm ill-defined low density is seen in pancreatic body resulting in severe dilatation of distal pancreatic duct. This is consistent with malignancy. Adrenal glands appear normal. Bilateral renal cysts are noted. No hydronephrosis or renal obstruction is noted. There is no evidence of bowel obstruction. Stool is noted throughout the colon. Atherosclerosis of abdominal aorta is noted without aneurysm formation. Sigmoid diverticulosis is noted without inflammation. Urinary bladder appears normal. No abnormal fluid collection is noted. IMPRESSION: Aortic atherosclerosis. Sigmoid diverticulosis without inflammation. 27 x 25 mm mass seen in pancreatic body resulting in dilatation of pancreatic duct ; this is consistent with pancreatic malignancy. These results will be called to the ordering clinician or representative by the Radiologist Assistant, and communication documented in the PACS or zVision Dashboard. Electronically Signed By: James Green Jr, M.D. On: 09/20/2015 15:07  Ct Abdomen W Wo Contrast  10/06/2015 CLINICAL DATA: Pancreatic cancer. EXAM: CT CHEST WITH CONTRAST CT ABDOMEN WITH AND WITHOUT CONTRAST TECHNIQUE: Multidetector CT imaging of the chest was performed during intravenous contrast administration. Multidetector CT imaging of the abdomen and pelvis was performed following the standard protocol before and during bolus administration of intravenous contrast. CONTRAST: 100mL ISOVUE-300 IOPAMIDOL (ISOVUE-300) INJECTION 61% COMPARISON: 09/20/2015. FINDINGS: CT CHEST FINDINGS Mediastinum/Nodes: There is no axillary lymphadenopathy. No mediastinal lymphadenopathy. There is no hilar lymphadenopathy. The heart size is normal.   No pericardial effusion. The esophagus has normal imaging features. Lungs/Pleura: Centrilobular emphysema noted. 3 mm right upper lobe pulmonary nodule identified image 21 series 8. 5 mm calcified granuloma noted  left upper lobe. Subsegmental atelectasis noted lower lobes bilaterally. Musculoskeletal: Bone windows reveal no worrisome lytic or sclerotic osseous lesions. CT ABDOMEN FINDINGS Hepatobiliary: No focal abnormality within the liver parenchyma. There is no evidence for gallstones, gallbladder wall thickening, or pericholecystic fluid. No intrahepatic or extrahepatic biliary dilation. Pancreas: Hypovascular pancreatic body mass appears slightly more prominent in the interval although reproducible measurement is difficult given the irregular margins of the lesion. The hypovascular area measures 4.0 x 2.7 cm today compared to 3.7 x 2.7 when I remeasure it at the same level and in the same dimensions on the prior study. Spleen: No splenomegaly. No focal mass lesion. Adrenals/Urinary Tract: No adrenal nodule or mass. Stable appearance bilateral renal cysts. Stomach/Bowel: Stomach is nondistended. No gastric wall thickening. No evidence of outlet obstruction. Duodenum is normally positioned as is the ligament of Treitz. Visualize small bowel of the abdomen is unremarkable. Diverticuli are seen in the abdominal segments of the colon. Vascular/Lymphatic: There is abdominal aortic atherosclerosis without aneurysm. No gastrohepatic ligament lymphadenopathy. 1.4 cm short axis hepatoduodenal ligament lymph node is stable. Tumor encases and attenuates the left gastric artery at its origin. Circumferential tumor encasement of the splenic artery is also evident. Fat plane around the SMA is relatively well preserved although tumor does abut the anterior wall of the proximal SMA. Other: Small volume intraperitoneal free fluid seen adjacent to the liver. Musculoskeletal: Bone windows reveal no worrisome lytic or sclerotic osseous lesions. IMPRESSION: 1. No substantial change in the hypovascular pancreatic lesion consistent with adenocarcinoma. Main duct in the tail the pancreas remains dilated. 2. 3 mm nonspecific right upper lobe  pulmonary nodule. Attention on follow-up recommended. 3. Stable 14 mm short axis hepatoduodenal ligament lymph node. This is borderline enlarged and attention on follow-up recommended as metastatic involvement not excluded. Electronically Signed By: Eric Mansell M.D. On: 10/06/2015 16:18     Other Problems (Ashley Beck, CMA; 10/11/2015 11:14 AM) Arthritis Diabetes Mellitus Diverticulosis Enlarged Prostate Gastroesophageal Reflux Disease High blood pressure Inguinal Hernia Kidney Stone Prostate Cancer  Past Surgical History (Ashley Beck, CMA; 10/11/2015 11:14 AM) Appendectomy Colon Removal - Partial Foot Surgery Right. Hip Surgery Left. Open Inguinal Hernia Surgery Bilateral. Oral Surgery Shoulder Surgery Right.  Diagnostic Studies History (Ashley Beck, CMA; 10/11/2015 11:14 AM) Colonoscopy 5-10 years ago  Allergies (Ashley Beck, CMA; 10/11/2015 11:16 AM) Cipro *FLUOROQUINOLONES* Benazepril HCl *CHEMICALS* ACE Inhibitors  Medication History (Ashley Beck, CMA; 10/11/2015 11:17 AM) AmLODIPine Besylate (10MG Tablet, Oral) Active. HydroCHLOROthiazide (12.5MG Capsule, Oral) Active. Pravastatin Sodium (20MG Tablet, Oral) Active. Multiple Vitamin (Oral) Active. Aleve (220MG Capsule, Oral) Active. Aspirin (81MG Tablet, Oral) Active. MetFORMIN HCl (500MG Tablet, Oral) Active. Latanoprost (0.005% Solution, Ophthalmic) Active. Medications Reconciled  Social History (Ashley Beck, CMA; 10/11/2015 11:14 AM) Alcohol use Occasional alcohol use. Caffeine use Coffee. No drug use Tobacco use Former smoker.  Family History (Ashley Beck, CMA; 10/11/2015 11:14 AM) Family history unknown First Degree Relatives    Review of Systems (Ashley Beck CMA; 10/11/2015 11:14 AM) General Not Present- Appetite Loss, Chills, Fatigue, Fever, Night Sweats, Weight Gain and Weight Loss. Skin Not Present- Change in Wart/Mole, Dryness, Hives, Jaundice, New Lesions,  Non-Healing Wounds, Rash and Ulcer. HEENT Present- Wears glasses/contact lenses. Not Present- Earache, Hearing Loss, Hoarseness, Nose Bleed, Oral Ulcers, Ringing in the Ears, Seasonal Allergies, Sinus Pain, Sore Throat,   Visual Disturbances and Yellow Eyes. Respiratory Not Present- Bloody sputum, Chronic Cough, Difficulty Breathing, Snoring and Wheezing. Breast Not Present- Breast Mass, Breast Pain, Nipple Discharge and Skin Changes. Cardiovascular Not Present- Chest Pain, Difficulty Breathing Lying Down, Leg Cramps, Palpitations, Rapid Heart Rate, Shortness of Breath and Swelling of Extremities. Gastrointestinal Present- Abdominal Pain, Excessive gas and Indigestion. Not Present- Bloating, Bloody Stool, Change in Bowel Habits, Chronic diarrhea, Constipation, Difficulty Swallowing, Gets full quickly at meals, Hemorrhoids, Nausea, Rectal Pain and Vomiting. Male Genitourinary Not Present- Blood in Urine, Change in Urinary Stream, Frequency, Impotence, Nocturia, Painful Urination, Urgency and Urine Leakage. Musculoskeletal Present- Back Pain and Swelling of Extremities. Not Present- Joint Pain, Joint Stiffness, Muscle Pain and Muscle Weakness. Neurological Not Present- Decreased Memory, Fainting, Headaches, Numbness, Seizures, Tingling, Tremor, Trouble walking and Weakness. Psychiatric Not Present- Anxiety, Bipolar, Change in Sleep Pattern, Depression, Fearful and Frequent crying. Endocrine Present- New Diabetes. Not Present- Cold Intolerance, Excessive Hunger, Hair Changes, Heat Intolerance and Hot flashes. Hematology Not Present- Blood Thinners, Easy Bruising, Excessive bleeding, Gland problems, HIV and Persistent Infections.  Vitals (Ashley Beck CMA; 10/11/2015 11:17 AM) 10/11/2015 11:17 AM Weight: 129 lb Height: 70in Body Surface Area: 1.73 m Body Mass Index: 18.51 kg/m  Temp.: 97.9F(Temporal)  Pulse: 85 (Regular)  BP: 126/76 (Sitting, Left Arm, Standard)       Assessment &  Plan (Anah Billard MD; 10/11/2015 1:07 PM) PRIMARY CANCER OF BODY OF PANCREAS (C25.1) Impression: The patient has a new diagnosis of borderline resectable pancreatic cancer. This is cT2N1M0, stage IIB pancreatic cancer. The mass does not appear to involve the celiac axis directly, but it does involve the left gastric artery and the splenic artery. It appears to be encasing the left gastric artery. It is also abutting the superior mesenteric artery anteriorly. He is going to get chemotherapy and then reassess. I suspect that he likely may not become resectable. If that is the case, we would pursue SBRT.  He also may not elect to have surgery due to risk benefit ratio. He is 79 and gets around well. I suspect that he would become much more frail with surgery.  In any event, I discussed the risks of Port-A-Cath placement with him. I reviewed the risk of leaving, infection, malfunction, possible need for additional surgeries or procedures, possible risk of pneumothorax. He understands and wishes to proceed. We'll make every effort to get that done this week. Current Plans Pt Education - [facility] port insertion education Schedule for Surgery   Signed by Kodee Ravert, MD (10/11/2015 1:08 PM) 

## 2015-10-13 NOTE — Interval H&P Note (Signed)
History and Physical Interval Note:  10/13/2015 10:12 AM  [name]  has presented today for surgery, with the diagnosis of pancreatic cancer  The various methods of treatment have been discussed with the patient and family. After consideration of risks, benefits and other options for treatment, the patient has consented to  Procedure(s): INSERTION PORT-A-CATH (N/A) as a surgical intervention .  The patient's history has been reviewed, patient examined, no change in status, stable for surgery.  I have reviewed the patient's chart and labs.  Questions were answered to the patient's satisfaction.     Mandee Pluta

## 2015-10-13 NOTE — Progress Notes (Signed)
Portable Upright Chest X-ray done. 

## 2015-10-13 NOTE — Progress Notes (Signed)
Mr [name]  Case [id number] approved-[email or web address]  10/13/15-10/12/16. Will fax also.  I will let [facility] know    [phone]

## 2015-10-14 ENCOUNTER — Other Ambulatory Visit: Payer: Self-pay | Admitting: *Deleted

## 2015-10-14 ENCOUNTER — Encounter (HOSPITAL_COMMUNITY): Payer: Self-pay | Admitting: General Surgery

## 2015-10-15 ENCOUNTER — Ambulatory Visit (HOSPITAL_BASED_OUTPATIENT_CLINIC_OR_DEPARTMENT_OTHER): Payer: Medicare Other

## 2015-10-15 ENCOUNTER — Ambulatory Visit: Payer: Medicare Other | Admitting: Hematology

## 2015-10-15 ENCOUNTER — Other Ambulatory Visit (HOSPITAL_BASED_OUTPATIENT_CLINIC_OR_DEPARTMENT_OTHER): Payer: Medicare Other

## 2015-10-15 VITALS — BP 143/70 | HR 86 | Temp 98.3°F | Resp 16

## 2015-10-15 DIAGNOSIS — C259 Malignant neoplasm of pancreas, unspecified: Secondary | ICD-10-CM

## 2015-10-15 DIAGNOSIS — C251 Malignant neoplasm of body of pancreas: Secondary | ICD-10-CM | POA: Diagnosis present

## 2015-10-15 DIAGNOSIS — Z5111 Encounter for antineoplastic chemotherapy: Secondary | ICD-10-CM

## 2015-10-15 LAB — CBC WITH DIFFERENTIAL/PLATELET
BASO%: 0.5 % (ref 0.0–2.0)
BASOS ABS: 0 10*3/uL (ref 0.0–0.1)
EOS ABS: 0.1 10*3/uL (ref 0.0–0.5)
EOS%: 0.9 % (ref 0.0–7.0)
HEMATOCRIT: 41.6 % (ref 38.4–49.9)
HEMOGLOBIN: 13.6 g/dL (ref 13.0–17.1)
LYMPH#: 1.5 10*3/uL (ref 0.9–3.3)
LYMPH%: 18.5 % (ref 14.0–49.0)
MCH: 26.6 pg — AB (ref 27.2–33.4)
MCHC: 32.6 g/dL (ref 32.0–36.0)
MCV: 81.6 fL (ref 79.3–98.0)
MONO#: 0.6 10*3/uL (ref 0.1–0.9)
MONO%: 8 % (ref 0.0–14.0)
NEUT#: 5.8 10*3/uL (ref 1.5–6.5)
NEUT%: 72.1 % (ref 39.0–75.0)
PLATELETS: 189 10*3/uL (ref 140–400)
RBC: 5.1 10*6/uL (ref 4.20–5.82)
RDW: 13.7 % (ref 11.0–14.6)
WBC: 8 10*3/uL (ref 4.0–10.3)

## 2015-10-15 LAB — COMPREHENSIVE METABOLIC PANEL
ALBUMIN: 3.8 g/dL (ref 3.5–5.0)
ALK PHOS: 75 U/L (ref 40–150)
ALT: 13 U/L (ref 0–55)
ANION GAP: 13 meq/L — AB (ref 3–11)
AST: 23 U/L (ref 5–34)
BUN: 18.6 mg/dL (ref 7.0–26.0)
CALCIUM: 9.3 mg/dL (ref 8.4–10.4)
CO2: 25 mEq/L (ref 22–29)
Chloride: 103 mEq/L (ref 98–109)
Creatinine: 1.1 mg/dL (ref 0.7–1.3)
EGFR: 78 mL/min/{1.73_m2} — AB (ref >90)
Glucose: 182 mg/dl — ABNORMAL HIGH (ref 70–140)
POTASSIUM: 3.5 meq/L (ref 3.5–5.1)
Sodium: 141 mEq/L (ref 136–145)
Total Bilirubin: 0.43 mg/dL (ref 0.20–1.20)
Total Protein: 7.2 g/dL (ref 6.4–8.3)

## 2015-10-15 MED ORDER — PROCHLORPERAZINE MALEATE 10 MG PO TABS
10.0000 mg | ORAL_TABLET | Freq: Once | ORAL | Status: AC
  Administered 2015-10-15: 10 mg via ORAL

## 2015-10-15 MED ORDER — SODIUM CHLORIDE 0.9 % IV SOLN
1000.0000 mg/m2 | Freq: Once | INTRAVENOUS | Status: AC
  Administered 2015-10-15: 1748 mg via INTRAVENOUS
  Filled 2015-10-15: qty 45.97

## 2015-10-15 MED ORDER — HEPARIN SOD (PORK) LOCK FLUSH 100 UNIT/ML IV SOLN
500.0000 [IU] | Freq: Once | INTRAVENOUS | Status: AC | PRN
  Administered 2015-10-15: 500 [IU]
  Filled 2015-10-15: qty 5

## 2015-10-15 MED ORDER — SODIUM CHLORIDE 0.9 % IV SOLN
Freq: Once | INTRAVENOUS | Status: AC
  Administered 2015-10-15: 13:00:00 via INTRAVENOUS

## 2015-10-15 MED ORDER — PACLITAXEL PROTEIN-BOUND CHEMO INJECTION 100 MG
120.0000 mg/m2 | Freq: Once | INTRAVENOUS | Status: AC
  Administered 2015-10-15: 200 mg via INTRAVENOUS
  Filled 2015-10-15: qty 40

## 2015-10-15 MED ORDER — SODIUM CHLORIDE 0.9% FLUSH
10.0000 mL | INTRAVENOUS | Status: DC | PRN
  Administered 2015-10-15: 10 mL
  Filled 2015-10-15: qty 10

## 2015-10-15 MED ORDER — PROCHLORPERAZINE MALEATE 10 MG PO TABS
ORAL_TABLET | ORAL | Status: AC
  Filled 2015-10-15: qty 1

## 2015-10-15 NOTE — Patient Instructions (Signed)
[facility] Discharge Instructions for Patients Receiving Chemotherapy  Today you received the following chemotherapy agents Abraxane and Gemzar.  To help prevent nausea and vomiting after your treatment, we encourage you to take your nausea medication.   If you develop nausea and vomiting that is not controlled by your nausea medication, call the clinic.   BELOW ARE SYMPTOMS THAT SHOULD BE REPORTED IMMEDIATELY:  *FEVER GREATER THAN 100.5 F  *CHILLS WITH OR WITHOUT FEVER  NAUSEA AND VOMITING THAT IS NOT CONTROLLED WITH YOUR NAUSEA MEDICATION  *UNUSUAL SHORTNESS OF BREATH  *UNUSUAL BRUISING OR BLEEDING  TENDERNESS IN MOUTH AND THROAT WITH OR WITHOUT PRESENCE OF ULCERS  *URINARY PROBLEMS  *BOWEL PROBLEMS  UNUSUAL RASH Items with * indicate a potential emergency and should be followed up as soon as possible.  Feel free to call the clinic you have any questions or concerns. The clinic phone number is [phone].  Please show the CHEMO ALERT CARD at check-in to the Emergency Department and triage nurse.   

## 2015-10-18 ENCOUNTER — Ambulatory Visit: Payer: Medicare Other | Admitting: Nutrition

## 2015-10-18 ENCOUNTER — Telehealth: Payer: Self-pay | Admitting: *Deleted

## 2015-10-18 NOTE — Telephone Encounter (Signed)
Called [name] for chemotherapy F/U.  Patient is doing well.  Denies n/v.  Denies any new side effects or symptoms.  Bowel's are a little slow so has started Colace.  Bladder is functioning too well having to get up at night and unable to return to sleep.  Eating and drinking well and I instructed to drink 64 oz minimum daily or at least the day before, of and after treatment.  Start drinking water upon awakening, stopping water three hours before bed, emptying bladder before lying down so maybe this will not be the cause of awakening.  Reports he is diabetic and prostate cancer.  Only question at this time is "If I am doing this well now, does that mean I'll do well throughout?"  Advised he very well may but we see m ore cummulative effect from treatments with delayed side effects.  Encouraged to call if needed.  Reviewed how to call after hours in the case of an emergency.

## 2015-10-18 NOTE — Progress Notes (Signed)
80 year old male diagnosed with pancreas cancer.  He is a patient of Dr. Burr Medico.  Past medical history includes hyperlipidemia, hypertension, GERD with stricture, diabetes, and prostate cancer.  Medications include Glucophage, Zofran, Compazine.  Labs were reviewed.  Height: 5 feet 10 inches. Weight: 133 pounds. Usual body weight: 135 pounds. BMI: 19.08.  Patient is status post first chemotherapy treatment on Friday. So far he has no nutrition impact symptoms. Describes himself as a Holiday representative.  He has never weighed more than 135 pounds or so. Really has no appetite but is forcing himself to eat.  Nutrition diagnosis: Food and nutrition related knowledge deficit related to new diagnosis of pancreas cancer and associated treatments as evidenced by no prior need for nutrition related information.  Intervention: Educated patient to consume high-calorie high-protein foods in small frequent meals and snacks. Reviewed increasing calories and protein fact sheet. Encouraged patient to attempt weight maintenance. Reviewed role of oral nutrition supplements and provided samples and coupons. Questions were answered.  Teach back method was used.  Monitoring, evaluation, goals:  Patient will tolerate increased calories and protein to promote weight maintenance.  Next visit: To be scheduled as needed.  **Disclaimer: This note was dictated with voice recognition software. Similar sounding words can inadvertently be transcribed and this note may contain transcription errors which may not have been corrected upon publication of note.**

## 2015-10-22 ENCOUNTER — Other Ambulatory Visit (HOSPITAL_BASED_OUTPATIENT_CLINIC_OR_DEPARTMENT_OTHER): Payer: Medicare Other

## 2015-10-22 ENCOUNTER — Ambulatory Visit (HOSPITAL_BASED_OUTPATIENT_CLINIC_OR_DEPARTMENT_OTHER): Payer: Medicare Other | Admitting: Hematology

## 2015-10-22 ENCOUNTER — Ambulatory Visit (HOSPITAL_BASED_OUTPATIENT_CLINIC_OR_DEPARTMENT_OTHER): Payer: Medicare Other

## 2015-10-22 VITALS — BP 149/65 | HR 82 | Temp 97.7°F | Resp 17 | Ht 70.0 in | Wt 129.9 lb

## 2015-10-22 DIAGNOSIS — I10 Essential (primary) hypertension: Secondary | ICD-10-CM

## 2015-10-22 DIAGNOSIS — Z5111 Encounter for antineoplastic chemotherapy: Secondary | ICD-10-CM

## 2015-10-22 DIAGNOSIS — C259 Malignant neoplasm of pancreas, unspecified: Secondary | ICD-10-CM

## 2015-10-22 DIAGNOSIS — C251 Malignant neoplasm of body of pancreas: Secondary | ICD-10-CM

## 2015-10-22 DIAGNOSIS — C61 Malignant neoplasm of prostate: Secondary | ICD-10-CM | POA: Diagnosis not present

## 2015-10-22 DIAGNOSIS — E119 Type 2 diabetes mellitus without complications: Secondary | ICD-10-CM

## 2015-10-22 DIAGNOSIS — G893 Neoplasm related pain (acute) (chronic): Secondary | ICD-10-CM | POA: Diagnosis not present

## 2015-10-22 LAB — CBC WITH DIFFERENTIAL/PLATELET
BASO%: 0.5 % (ref 0.0–2.0)
Basophils Absolute: 0 10*3/uL (ref 0.0–0.1)
EOS%: 1.1 % (ref 0.0–7.0)
Eosinophils Absolute: 0 10*3/uL (ref 0.0–0.5)
HEMATOCRIT: 39.8 % (ref 38.4–49.9)
HGB: 13 g/dL (ref 13.0–17.1)
LYMPH#: 0.9 10*3/uL (ref 0.9–3.3)
LYMPH%: 29.1 % (ref 14.0–49.0)
MCH: 26.6 pg — AB (ref 27.2–33.4)
MCHC: 32.7 g/dL (ref 32.0–36.0)
MCV: 81.2 fL (ref 79.3–98.0)
MONO#: 0.3 10*3/uL (ref 0.1–0.9)
MONO%: 7.8 % (ref 0.0–14.0)
NEUT#: 2 10*3/uL (ref 1.5–6.5)
NEUT%: 61.5 % (ref 39.0–75.0)
PLATELETS: 145 10*3/uL (ref 140–400)
RBC: 4.9 10*6/uL (ref 4.20–5.82)
RDW: 13.4 % (ref 11.0–14.6)
WBC: 3.3 10*3/uL — ABNORMAL LOW (ref 4.0–10.3)

## 2015-10-22 LAB — COMPREHENSIVE METABOLIC PANEL
ALT: 25 U/L (ref 0–55)
ANION GAP: 10 meq/L (ref 3–11)
AST: 26 U/L (ref 5–34)
Albumin: 3.3 g/dL — ABNORMAL LOW (ref 3.5–5.0)
Alkaline Phosphatase: 63 U/L (ref 40–150)
BUN: 22.2 mg/dL (ref 7.0–26.0)
CALCIUM: 8.9 mg/dL (ref 8.4–10.4)
CHLORIDE: 103 meq/L (ref 98–109)
CO2: 26 mEq/L (ref 22–29)
Creatinine: 1.3 mg/dL (ref 0.7–1.3)
EGFR: 63 mL/min/{1.73_m2} — AB (ref >90)
Glucose: 308 mg/dl — ABNORMAL HIGH (ref 70–140)
POTASSIUM: 3.8 meq/L (ref 3.5–5.1)
Sodium: 139 mEq/L (ref 136–145)
Total Bilirubin: 0.39 mg/dL (ref 0.20–1.20)
Total Protein: 6.9 g/dL (ref 6.4–8.3)

## 2015-10-22 MED ORDER — PACLITAXEL PROTEIN-BOUND CHEMO INJECTION 100 MG
120.0000 mg/m2 | Freq: Once | INTRAVENOUS | Status: AC
  Administered 2015-10-22: 200 mg via INTRAVENOUS
  Filled 2015-10-22: qty 40

## 2015-10-22 MED ORDER — SODIUM CHLORIDE 0.9 % IV SOLN
Freq: Once | INTRAVENOUS | Status: AC
  Administered 2015-10-22: 12:00:00 via INTRAVENOUS

## 2015-10-22 MED ORDER — PROCHLORPERAZINE MALEATE 10 MG PO TABS
ORAL_TABLET | ORAL | Status: AC
  Filled 2015-10-22: qty 1

## 2015-10-22 MED ORDER — SODIUM CHLORIDE 0.9% FLUSH
10.0000 mL | INTRAVENOUS | Status: DC | PRN
  Administered 2015-10-22: 10 mL
  Filled 2015-10-22: qty 10

## 2015-10-22 MED ORDER — SODIUM CHLORIDE 0.9 % IV SOLN
1000.0000 mg/m2 | Freq: Once | INTRAVENOUS | Status: AC
  Administered 2015-10-22: 1748 mg via INTRAVENOUS
  Filled 2015-10-22: qty 45.97

## 2015-10-22 MED ORDER — HEPARIN SOD (PORK) LOCK FLUSH 100 UNIT/ML IV SOLN
500.0000 [IU] | Freq: Once | INTRAVENOUS | Status: AC | PRN
  Administered 2015-10-22: 500 [IU]
  Filled 2015-10-22: qty 5

## 2015-10-22 MED ORDER — PROCHLORPERAZINE MALEATE 10 MG PO TABS
10.0000 mg | ORAL_TABLET | Freq: Once | ORAL | Status: AC
  Administered 2015-10-22: 10 mg via ORAL

## 2015-10-22 NOTE — Patient Instructions (Signed)
[facility] Discharge Instructions for Patients Receiving Chemotherapy  Today you received the following chemotherapy agents:  Abraxane, Gemzar  To help prevent nausea and vomiting after your treatment, we encourage you to take your nausea medication as prescribed.   If you develop nausea and vomiting that is not controlled by your nausea medication, call the clinic.   BELOW ARE SYMPTOMS THAT SHOULD BE REPORTED IMMEDIATELY:  *FEVER GREATER THAN 100.5 F  *CHILLS WITH OR WITHOUT FEVER  NAUSEA AND VOMITING THAT IS NOT CONTROLLED WITH YOUR NAUSEA MEDICATION  *UNUSUAL SHORTNESS OF BREATH  *UNUSUAL BRUISING OR BLEEDING  TENDERNESS IN MOUTH AND THROAT WITH OR WITHOUT PRESENCE OF ULCERS  *URINARY PROBLEMS  *BOWEL PROBLEMS  UNUSUAL RASH Items with * indicate a potential emergency and should be followed up as soon as possible.  Feel free to call the clinic you have any questions or concerns. The clinic phone number is [phone].  Please show the CHEMO ALERT CARD at check-in to the Emergency Department and triage nurse.   

## 2015-10-22 NOTE — Progress Notes (Signed)
[place]  [phone] [phone]  Clinic follow up Note   Patient Care Team: Binnie Rail, MD as PCP - General (Internal Medicine) [name], MD as Consulting Physician (General Surgery) 10/22/2015  Referring physician: Dr. Ardis Hughs  CHIEF COMPLAINTS:  Follow up pancreatic cancer  Oncology History   Primary pancreatic cancer [facility])   Staging form: Pancreas, AJCC 7th Edition     Clinical stage from 09/30/2015: Stage IIB (T2, N1, M0) - Signed by Truitt Merle, MD on 10/07/2015       Primary pancreatic cancer ([place])   09/30/2015 Initial Diagnosis    Primary pancreatic cancer ([place])     09/30/2015 Initial Biopsy     endoscopic pancreatic body mass fine-needle aspiration show malignant cells consistent with adenocarcinoma.     09/30/2015 Procedure     EUS showe a 2.5 cm pancreatic mass causing a stream the dictation of the main pancreatic duct, and directly abutting the SMA an     10/06/2015 Imaging     CT chest, abdomen and  pelvis showed a  4 cm mass in the body of  pancreas, tumor abut the anterior wall of the SMA, a 1.4cm  he pedal duoligament lymph nodes is suspicious, 35mm RUL lung nodule is nonspecific, no other distant mets     10/15/2015 -  Chemotherapy    neoadjuvant chemo gemcitabine 1000mg /m2 and abraxane 120mg /m2, on day 1, 8 every 21 days        HISTORY OF PRESENTING ILLNESS:  [name] 80 y.o. male is here because of His recently diagnosed pancreatic cancer. He presents to the clinic with his wife today.  He has been having abdominal pain for the past one month, worst in the past few weeks, radiates to back, 3-5/10, intermittent, worse with empty stomack, no nausea, bloating, BM is slightly constipated, once a daily. No melana or hematochezia. He has good appetie and eats well, mild fatigue, he is aboe to do his daily activities without difficulty, such as morning his long. He also exercise regularly, bikes a few miles a day. No  significant weight loss recently.  He was seen by his primary care physician, and a CT chest and abdomen was done on 09/20/2015 which showed a 2.7 x 2.5 cm mass in the pancreatic body with associated pancreatic duct dilatation. He was referred to see GI Dr. Ardis Hughs, and underwent EGD and EUS, backward a mass fine-needle biopsy, which showed adenocarcinoma.  He was diagnosed with prosttae can 5 years ago, has been monitored, no treatment, he has low PSA level.  He is diabetic, he is to be on metformin, has been off for a while because his sugar has been relatively controlled with diet alone. His recent HbA1c was some 7.9 in 05/2015.   CURRENT THERAPY: neoadjuvant chemo gemcitabine 1000mg /m2 and abraxane 120mg /m2, on day 1, 8 and 15 every 28 days   INTERIM HISTORY: [name] returns for follow up and second week chemo. He tolerated the first week chemotherapy very well, had a mild fatigue, no significant nausea, diarrhea, or other symptoms. His abdominal pain has improved after chemotherapy. He has chronic insomnia, does not sleep well at night. He has occasional constipation, uses stool softener.  MEDICAL HISTORY:  Past Medical History:  Diagnosis Date  . Arthritis    R hip and shoulder  . Complication of anesthesia    Local anesthetic used in office for Prostate biopsy" reaction Tachycardia,nausea, hallucinations, Blood pressure elevated"- No problems once done at hospital  with anesthesia" Thinks Ciprofloxacin was the injection"   . Diabetes mellitus   . Enlarged prostate   . Femoral bruit    R femoral  . GERD with stricture    PMH of  . Hyperlipidemia   . Hypertension   . Macular degeneration    glaucoma suspect  . PONV (postoperative nausea and vomiting)   . Prostate cancer [facility])    prostate ; Dr Wyvonnia Lora to be monitored x 5 yrs now.  . Squamous papilloma    of esophagus    SURGICAL HISTORY: Past Surgical History:  Procedure Laterality Date  . ANKLE FUSION  2004  .  COLONOSCOPY  05/2012  . esophageal dilation  2002   Dr  Lucio Edward  . ESOPHAGOGASTRODUODENOSCOPY (EGD) WITH PROPOFOL N/A 09/30/2015   Procedure: ESOPHAGOGASTRODUODENOSCOPY (EGD) WITH PROPOFOL;  Surgeon: Milus Banister, MD;  Location: [facility];  Service: Endoscopy;  Laterality: N/A;  . EUS N/A 09/30/2015   Procedure: UPPER ENDOSCOPIC ULTRASOUND (EUS) RADIAL;  Surgeon: Milus Banister, MD;  Location: [facility];  Service: Endoscopy;  Laterality: N/A;  . FINE NEEDLE ASPIRATION N/A 09/30/2015   Procedure: FINE NEEDLE ASPIRATION (FNA) LINEAR;  Surgeon: Milus Banister, MD;  Location: [facility];  Service: Endoscopy;  Laterality: N/A;  . HERNIA REPAIR     bil inguinal hernias  . PARTIAL COLECTOMY     perforated diverticulitis  . PORTACATH PLACEMENT Left 10/13/2015   Procedure: INSERTION PORT-A-CATH;  Surgeon: [name], MD;  Location: [facility];  Service: General;  Laterality: Left;  . PROSTATE BIOPSY  03/29/2012   Procedure: BIOPSY TRANSRECTAL ULTRASONIC PROSTATE (TUBP);  Surgeon: Fredricka Bonine, MD;  [facility];  Service: Urology;  Laterality: N/A;  . ROTATOR CUFF REPAIR  2006  . TOTAL HIP ARTHROPLASTY Left 2012    SOCIAL HISTORY: Social History   Social History  . Marital status: Married    Spouse name: Deloris  . Number of children: 1  . Years of education: N/A   Occupational History  . 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 [employer]  . Smoking status: Former Smoker    Packs/day: 0.50    Years: 40.00    Types: Cigarettes    Quit date: 03/27/1989  . Smokeless tobacco: Never Used     Comment: smoked [place], up to 1 ppd  . Alcohol use No  . Drug use: No  . Sexual activity: Not on file   Other Topics Concern  . 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 He and his wife moved to [place] to be closer to his son.  He has one son and one Granddaughter who is 71.  FAMILY HISTORY: Family History  Problem Relation Age of Onset  . Kidney disease Father     ? Rheumatic fever as child  . Hypertension Father   . Heart attack Paternal Uncle     in 28s  . COPD Paternal Uncle     due to exposures in [facility] 2  . Cancer Neg Hx   . Diabetes Neg Hx   . Stroke Neg Hx   . Colon  cancer Neg Hx   . Kidney disease Paternal Aunt     ? Rheumatic fever as child    ALLERGIES:  is allergic to ace inhibitors; angiotensin receptor blockers; and ciprofloxacin.  MEDICATIONS:  Current Outpatient Prescriptions  Medication Sig Dispense Refill  . amLODipine (NORVASC) 10 MG tablet TAKE 1 TABLET ONCE A DAY 90 tablet 3  . aspirin 81 MG tablet Take 1 tablet (81 mg total) by mouth daily. 30 tablet   . doxazosin (CARDURA) 1 MG tablet TAKE 1 TABLET BY MOUTH EVERY DAY 90 tablet 2  . glucose blood (ONETOUCH VERIO) test strip Check blood sugar once daily 100 each 12  . hydrochlorothiazide (MICROZIDE) 12.5 MG capsule Take 1 capsule (12.5 mg total) by mouth daily. 90 capsule 3  . latanoprost (XALATAN) 0.005 % ophthalmic solution Place 1 drop into both eyes at bedtime.  1  . lidocaine-prilocaine (EMLA) cream Apply 1 application topically as needed. Apply to pac site 1 hour prior to stick and cover with plastic wrap 30 g 11  . metFORMIN (GLUCOPHAGE) 500 MG tablet Take 1 tablet (500 mg total) by mouth 2 (two) times daily with a meal. 180 tablet 3  . multivitamin-lutein (OCUVITE-LUTEIN) CAPS Take 1 capsule by mouth daily.    . naproxen sodium (ANAPROX) 220 MG tablet Take 220 mg by mouth 2 (two) times daily with a meal.    . ondansetron (ZOFRAN) 8 MG tablet Take 1 tablet (8 mg total) by mouth 2 (two) times daily as needed (Nausea or vomiting). (Patient not taking: Reported on 10/11/2015) 30  tablet 1  . ONETOUCH DELICA LANCETS 99991111 MISC Use to check blood sugars once a day Dx E11.9 100 each 3  . ONETOUCH VERIO test strip CHECK BLOOD SUGAR ONCE DAILY. DX250.00 100 each 5  . oxyCODONE (OXY IR/ROXICODONE) 5 MG immediate release tablet Take 1-2 tablets (5-10 mg total) by mouth every 6 (six) hours as needed for moderate pain, severe pain or breakthrough pain. 30 tablet 0  . pravastatin (PRAVACHOL) 20 MG tablet TAKE 1 TABLET AT BEDTIME 90 tablet 3  . prochlorperazine (COMPAZINE) 10 MG tablet Take 1 tablet (10 mg total) by mouth every 6 (six) hours as needed (Nausea or vomiting). (Patient not taking: Reported on 10/11/2015) 30 tablet 1   No current facility-administered medications for this visit.     REVIEW OF SYSTEMS:   Constitutional: Denies fevers, chills or abnormal night sweats Eyes: Denies blurriness of vision, double vision or watery eyes Ears, nose, mouth, throat, and face: Denies mucositis or sore throat Respiratory: Denies cough, dyspnea or wheezes Cardiovascular: Denies palpitation, chest discomfort or lower extremity swelling Gastrointestinal:  Denies nausea, heartburn or change in bowel habits, (+) epigastric pain Skin: Denies abnormal skin rashes Lymphatics: Denies new lymphadenopathy or easy bruising Neurological:Denies numbness, tingling or new weaknesses Behavioral/Psych: Mood is stable, no new changes  All other systems were reviewed with the patient and are negative.  PHYSICAL EXAMINATION: ECOG PERFORMANCE STATUS: 1 - Symptomatic but completely ambulatory  Vitals:   10/22/15 1126  BP: (!) 149/65  Pulse: 82  Resp: 17  Temp: 97.7 F (36.5 C)   Filed Weights   10/22/15 1126  Weight: 129 lb 14.4 oz (58.9 kg)    GENERAL:alert, no distress and comfortable SKIN: skin color, texture, turgor are normal, no rashes or significant lesions EYES: normal, conjunctiva are pink and non-injected, sclera clear OROPHARYNX:no exudate, no erythema and lips, buccal mucosa,  and tongue normal  NECK: supple, thyroid normal size, non-tender, without  nodularity LYMPH:  no palpable lymphadenopathy in the cervical, axillary or inguinal LUNGS: clear to auscultation and percussion with normal breathing effort HEART: regular rate & rhythm and no murmurs and no lower extremity edema ABDOMEN:abdomen soft, non-tender and normal bowel sounds Musculoskeletal:no cyanosis of digits and no clubbing  PSYCH: alert & oriented x 3 with fluent speech NEURO: no focal motor/sensory deficits  LABORATORY DATA:  I have reviewed the data as listed CBC Latest Ref Rng & Units 10/22/2015 10/15/2015 10/07/2015  WBC 4.0 - 10.3 10e3/uL 3.3(L) 8.0 7.0  Hemoglobin 13.0 - 17.1 g/dL 13.0 13.6 14.4  Hematocrit 38.4 - 49.9 % 39.8 41.6 42.6  Platelets 140 - 400 10e3/uL 145 189 186   CMP Latest Ref Rng & Units 10/22/2015 10/15/2015 10/07/2015  Glucose 70 - 140 mg/dl 308(H) 182(H) 137  BUN 7.0 - 26.0 mg/dL 22.2 18.6 22.9  Creatinine 0.7 - 1.3 mg/dL 1.3 1.1 1.3  Sodium 136 - 145 mEq/L 139 141 141  Potassium 3.5 - 5.1 mEq/L 3.8 3.5 3.6  Chloride 96 - 112 mEq/L - - -  CO2 22 - 29 mEq/L 26 25 28   Calcium 8.4 - 10.4 mg/dL 8.9 9.3 9.5  Total Protein 6.4 - 8.3 g/dL 6.9 7.2 7.6  Total Bilirubin 0.20 - 1.20 mg/dL 0.39 0.43 0.47  Alkaline Phos 40 - 150 U/L 63 75 78  AST 5 - 34 U/L 26 23 25   ALT 0 - 55 U/L 25 13 19    CA19.9 (0-35U/ml) 09/30/2015: 370 10/07/2015: 516   PATHOLOGY REPORT Diagnosis 09/30/2015 FINE NEEDLE ASPIRATION, ENDOSCOPIC PANCREATIC BODY (SPECIMEN 1 OF 1 COLLECTED 09/30/2015) MALIGNANT CELLS PRESENT, CONSISTENT WITH ADENOCARCINOMA. SEE COMMENT. COMMENT: THE CASE WAS DISCUSSED WITH DR. Ardis Hughs ON 10/01/2015.  RADIOGRAPHIC STUDIES: I have personally reviewed the radiological images as listed and agreed with the findings in the report. Ct Chest W Contrast  Result Date: 10/06/2015 CLINICAL DATA:  Pancreatic cancer. EXAM: CT CHEST WITH CONTRAST CT ABDOMEN WITH AND WITHOUT CONTRAST TECHNIQUE:  Multidetector CT imaging of the chest was performed during intravenous contrast administration. Multidetector CT imaging of the abdomen and pelvis was performed following the standard protocol before and during bolus administration of intravenous contrast. CONTRAST:  158mL ISOVUE-300 IOPAMIDOL (ISOVUE-300) INJECTION 61% COMPARISON:  09/20/2015. FINDINGS: CT CHEST FINDINGS Mediastinum/Nodes: There is no axillary lymphadenopathy. No mediastinal lymphadenopathy. There is no hilar lymphadenopathy. The heart size is normal. No pericardial effusion. The esophagus has normal imaging features. Lungs/Pleura: Centrilobular emphysema noted. 3 mm right upper lobe pulmonary nodule identified image 21 series 8. 5 mm calcified granuloma noted left upper lobe. Subsegmental atelectasis noted lower lobes bilaterally. Musculoskeletal: Bone windows reveal no worrisome lytic or sclerotic osseous lesions. CT ABDOMEN FINDINGS Hepatobiliary: No focal abnormality within the liver parenchyma. There is no evidence for gallstones, gallbladder wall thickening, or pericholecystic fluid. No intrahepatic or extrahepatic biliary dilation. Pancreas: Hypovascular pancreatic body mass appears slightly more prominent in the interval although reproducible measurement is difficult given the irregular margins of the lesion. The hypovascular area measures 4.0 x 2.7 cm today compared to 3.7 x 2.7 when I remeasure it at the same level and in the same dimensions on the prior study. Spleen: No splenomegaly. No focal mass lesion. Adrenals/Urinary Tract: No adrenal nodule or mass. Stable appearance bilateral renal cysts. Stomach/Bowel: Stomach is nondistended. No gastric wall thickening. No evidence of outlet obstruction. Duodenum is normally positioned as is the ligament of Treitz. Visualize small bowel of the abdomen is unremarkable. Diverticuli are seen in the abdominal segments  of the colon. Vascular/Lymphatic: There is abdominal aortic atherosclerosis  without aneurysm. No gastrohepatic ligament lymphadenopathy. 1.4 cm short axis hepatoduodenal ligament lymph node is stable. Tumor encases and attenuates the left gastric artery at its origin. Circumferential tumor encasement of the splenic artery is also evident. Fat plane around the SMA is relatively well preserved although tumor does abut the anterior wall of the proximal SMA. Other: Small volume intraperitoneal free fluid seen adjacent to the liver. Musculoskeletal: Bone windows reveal no worrisome lytic or sclerotic osseous lesions. IMPRESSION: 1. No substantial change in the hypovascular pancreatic lesion consistent with adenocarcinoma. Main duct in the tail the pancreas remains dilated. 2. 3 mm nonspecific right upper lobe pulmonary nodule. Attention on follow-up recommended. 3. Stable 14 mm short axis hepatoduodenal ligament lymph node. This is borderline enlarged and attention on follow-up recommended as metastatic involvement not excluded. Electronically Signed   By: Misty Stanley M.D.   On: 10/06/2015 16:18   Ct Abdomen W Wo Contrast  Result Date: 10/06/2015 CLINICAL DATA:  Pancreatic cancer. EXAM: CT CHEST WITH CONTRAST CT ABDOMEN WITH AND WITHOUT CONTRAST TECHNIQUE: Multidetector CT imaging of the chest was performed during intravenous contrast administration. Multidetector CT imaging of the abdomen and pelvis was performed following the standard protocol before and during bolus administration of intravenous contrast. CONTRAST:  151mL ISOVUE-300 IOPAMIDOL (ISOVUE-300) INJECTION 61% COMPARISON:  09/20/2015. FINDINGS: CT CHEST FINDINGS Mediastinum/Nodes: There is no axillary lymphadenopathy. No mediastinal lymphadenopathy. There is no hilar lymphadenopathy. The heart size is normal. No pericardial effusion. The esophagus has normal imaging features. Lungs/Pleura: Centrilobular emphysema noted. 3 mm right upper lobe pulmonary nodule identified image 21 series 8. 5 mm calcified granuloma noted left  upper lobe. Subsegmental atelectasis noted lower lobes bilaterally. Musculoskeletal: Bone windows reveal no worrisome lytic or sclerotic osseous lesions. CT ABDOMEN FINDINGS Hepatobiliary: No focal abnormality within the liver parenchyma. There is no evidence for gallstones, gallbladder wall thickening, or pericholecystic fluid. No intrahepatic or extrahepatic biliary dilation. Pancreas: Hypovascular pancreatic body mass appears slightly more prominent in the interval although reproducible measurement is difficult given the irregular margins of the lesion. The hypovascular area measures 4.0 x 2.7 cm today compared to 3.7 x 2.7 when I remeasure it at the same level and in the same dimensions on the prior study. Spleen: No splenomegaly. No focal mass lesion. Adrenals/Urinary Tract: No adrenal nodule or mass. Stable appearance bilateral renal cysts. Stomach/Bowel: Stomach is nondistended. No gastric wall thickening. No evidence of outlet obstruction. Duodenum is normally positioned as is the ligament of Treitz. Visualize small bowel of the abdomen is unremarkable. Diverticuli are seen in the abdominal segments of the colon. Vascular/Lymphatic: There is abdominal aortic atherosclerosis without aneurysm. No gastrohepatic ligament lymphadenopathy. 1.4 cm short axis hepatoduodenal ligament lymph node is stable. Tumor encases and attenuates the left gastric artery at its origin. Circumferential tumor encasement of the splenic artery is also evident. Fat plane around the SMA is relatively well preserved although tumor does abut the anterior wall of the proximal SMA. Other: Small volume intraperitoneal free fluid seen adjacent to the liver. Musculoskeletal: Bone windows reveal no worrisome lytic or sclerotic osseous lesions. IMPRESSION: 1. No substantial change in the hypovascular pancreatic lesion consistent with adenocarcinoma. Main duct in the tail the pancreas remains dilated. 2. 3 mm nonspecific right upper lobe  pulmonary nodule. Attention on follow-up recommended. 3. Stable 14 mm short axis hepatoduodenal ligament lymph node. This is borderline enlarged and attention on follow-up recommended as metastatic involvement not excluded. Electronically Signed  By: Misty Stanley M.D.   On: 10/06/2015 16:18   Dg Chest Port 1 View  Result Date: 10/13/2015 CLINICAL DATA:  Port-A-Cath placement.  Pancreatic carcinoma EXAM: DG C-ARM 1-60 MIN-NO REPORT; PORTABLE CHEST - 1 VIEW COMPARISON:  Chest radiograph November 30, 2009 and chest CT October 06, 2015 FINDINGS: Port-A-Cath tip is in the superior vena cava in. No pneumothorax. No edema or consolidation. Heart size and pulmonary vascularity are normal. No adenopathy. There is atherosclerotic calcification in the proximal descending thoracic aorta. There are no demonstrable blastic or lytic bone lesions evident. IMPRESSION: Port-A-Cath tip in superior vena cava. No pneumothorax. No edema or consolidation. There is aortic atherosclerosis. Electronically Signed   By: Lowella Grip III M.D.   On: 10/13/2015 11:53   Dg C-arm 1-60 Min-no Report  Result Date: 10/13/2015 CLINICAL DATA: port C-ARM 1-60 MINUTES Fluoroscopy was utilized by the requesting physician.  No radiographic interpretation.   Upper EUS 09/30/2015 Dr. Ardis Hughs  Endosonographic Finding 1. An irregular mass was identified in the pancreatic body. The mass was hypoechoic. The mass measured 25 mm in maximal cross-sectional diameter. The endosonographic borders were poorlydefined. The mass directly abuts the SMA for about 1.5cm and also abuts the Celiac trunk for a short distance. The remainder of the pancreatic parenchyma was normal. Upstream from the mass, the main pancreatic duct was dilated, a tortuous/ectatic duct and a maximum duct diameter of 5 mm. Fine needle aspiration for cytology was performed. Color Doppler imaging was utilized prior to needle puncture to confirm a lack of significant vascular  structures within the needle path. Three passes were made with the 25 gauge needle using a transgastric approach. Some passes were made with a stylet. A cytotechnologist was present to evaluate the adequacy of the specimen. 2. No peripancreatic adenopathy 3. CBD was normal, non-dilated 4. Gallbladder was normal 5. Limited views of liver, spleen, portal and splenic vessels were all normal.  - Schatzki's ring (vs focal peptic stricture) at the GE junction - 2.5cm mass in the body of pancreas causing upstream dilation of the main pancreatic duct, directly abutting the SMA and celiac trunk. Preliminary cytology review from the mass was positive for malignancy (likely adenocarcinoma)   ASSESSMENT & PLAN: 80 year old African-American male, presented with epigastric pain for a month.  1. Primary pancreatic cancer, adenocarcinoma in the body of pancreas,cT2N1M0, stage IIB  -I reviewed his CT scan findings, EUS findings and pancreatic mass biopsy pathology results with pt and his wife in details. -His CT scan showed no evidence of distant metastasis, there is a 14 mm hepatoduodenal node on CT which is suspicious for nodal metastasis, EUS was negative for peripancreatic adenopathy.  -CT abdomen with contrast (pancreatic protocol) showed the pancreatic mass abuts the SMA. His images were reviewed in our tumor board yesterday, Dr. Barry Dienes feels his pancreas cancer is borderline resectable, and recommend neoadjuvant chemo. -We discussed the role of chemotherapy before and after surgery. We reviewed the data of different chemotherapy regimens in pancreatic cancer, including single agent gemcitabine, gemcitabine and Abraxane, and FOLFIRINOX. Given his advanced age, but preserved performance status and limited medical comorbidities, I recommend him to have neoadjuvant chemotherapy with gemcitabine and Abraxane weekly, 2-3 weeks on, one-week off.  -He tolerated the first week chemotherapy well, lab reviewed,  adequate for treatment, we'll proceed to week 2 gemcitabine and Abraxane.  2. DM -He is not on medication, however his recent HbA1c was 7.9 in 05/2015 -I recommend him to see his primary care physician Dr.  Burns, and consider restart metformin which he was on before -We discussed the impact of chemotherapy, especially premedication dexamethasone, on his blood glucose. We will try to use minimal or no steroids before chemotherapy duties diabetes  3. Abdominal pain -secondary to #1 -He takes Tylenol and ibuprofen as needed, not very often, we'll continue -If his pain gets worse, we'll add on tramadol  4. Prostate cancer, untreated  -His prostate cancer has been monitored by his urologist, treatment was not recommended -will follow up clinically   Plan -Lab reviewed, adequate for treatment, we'll proceed with cycle 1 week 2 chemotherapy, and he will continue day 15 treatment next week -I'll see him back in 2 weeks before cycle 2 day 1 chemo    All questions were answered. The patient knows to call the clinic with any problems, questions or concerns. I spent 25 minutes counseling the patient face to face. The total time spent in the appointment was 30 minutes and more than 50% was on counseling.     Truitt Merle, MD 10/22/2015 7:42 AM

## 2015-10-23 ENCOUNTER — Encounter: Payer: Self-pay | Admitting: Hematology

## 2015-10-29 ENCOUNTER — Ambulatory Visit (HOSPITAL_BASED_OUTPATIENT_CLINIC_OR_DEPARTMENT_OTHER): Payer: Medicare Other

## 2015-10-29 ENCOUNTER — Other Ambulatory Visit (HOSPITAL_BASED_OUTPATIENT_CLINIC_OR_DEPARTMENT_OTHER): Payer: Medicare Other

## 2015-10-29 VITALS — BP 155/77 | HR 90 | Temp 98.0°F | Resp 18

## 2015-10-29 DIAGNOSIS — C259 Malignant neoplasm of pancreas, unspecified: Secondary | ICD-10-CM

## 2015-10-29 DIAGNOSIS — C251 Malignant neoplasm of body of pancreas: Secondary | ICD-10-CM

## 2015-10-29 DIAGNOSIS — Z5111 Encounter for antineoplastic chemotherapy: Secondary | ICD-10-CM | POA: Diagnosis present

## 2015-10-29 LAB — COMPREHENSIVE METABOLIC PANEL
ALT: 22 U/L (ref 0–55)
AST: 21 U/L (ref 5–34)
Albumin: 3.5 g/dL (ref 3.5–5.0)
Alkaline Phosphatase: 70 U/L (ref 40–150)
Anion Gap: 12 mEq/L — ABNORMAL HIGH (ref 3–11)
BUN: 19.7 mg/dL (ref 7.0–26.0)
CHLORIDE: 102 meq/L (ref 98–109)
CO2: 25 mEq/L (ref 22–29)
Calcium: 9.3 mg/dL (ref 8.4–10.4)
Creatinine: 1.1 mg/dL (ref 0.7–1.3)
EGFR: 73 mL/min/{1.73_m2} — AB (ref >90)
GLUCOSE: 205 mg/dL — AB (ref 70–140)
POTASSIUM: 3.5 meq/L (ref 3.5–5.1)
Sodium: 140 mEq/L (ref 136–145)
Total Bilirubin: <0.3 mg/dL (ref 0.20–1.20)
Total Protein: 7.1 g/dL (ref 6.4–8.3)

## 2015-10-29 LAB — CBC WITH DIFFERENTIAL/PLATELET
BASO%: 0.2 % (ref 0.0–2.0)
BASOS ABS: 0 10*3/uL (ref 0.0–0.1)
EOS%: 0.6 % (ref 0.0–7.0)
Eosinophils Absolute: 0 10*3/uL (ref 0.0–0.5)
HCT: 39.4 % (ref 38.4–49.9)
HGB: 12.8 g/dL — ABNORMAL LOW (ref 13.0–17.1)
LYMPH%: 18.7 % (ref 14.0–49.0)
MCH: 26.6 pg — AB (ref 27.2–33.4)
MCHC: 32.5 g/dL (ref 32.0–36.0)
MCV: 82.1 fL (ref 79.3–98.0)
MONO#: 0.3 10*3/uL (ref 0.1–0.9)
MONO%: 6.3 % (ref 0.0–14.0)
NEUT#: 3 10*3/uL (ref 1.5–6.5)
NEUT%: 74.2 % (ref 39.0–75.0)
Platelets: 129 10*3/uL — ABNORMAL LOW (ref 140–400)
RBC: 4.8 10*6/uL (ref 4.20–5.82)
RDW: 13.2 % (ref 11.0–14.6)
WBC: 4 10*3/uL (ref 4.0–10.3)
lymph#: 0.8 10*3/uL — ABNORMAL LOW (ref 0.9–3.3)

## 2015-10-29 MED ORDER — HEPARIN SOD (PORK) LOCK FLUSH 100 UNIT/ML IV SOLN
500.0000 [IU] | Freq: Once | INTRAVENOUS | Status: AC | PRN
  Administered 2015-10-29: 500 [IU]
  Filled 2015-10-29: qty 5

## 2015-10-29 MED ORDER — PROCHLORPERAZINE MALEATE 10 MG PO TABS
10.0000 mg | ORAL_TABLET | Freq: Once | ORAL | Status: AC
  Administered 2015-10-29: 10 mg via ORAL

## 2015-10-29 MED ORDER — SODIUM CHLORIDE 0.9 % IV SOLN
Freq: Once | INTRAVENOUS | Status: AC
  Administered 2015-10-29: 11:00:00 via INTRAVENOUS

## 2015-10-29 MED ORDER — SODIUM CHLORIDE 0.9% FLUSH
10.0000 mL | INTRAVENOUS | Status: DC | PRN
  Administered 2015-10-29: 10 mL
  Filled 2015-10-29: qty 10

## 2015-10-29 MED ORDER — PROCHLORPERAZINE MALEATE 10 MG PO TABS
ORAL_TABLET | ORAL | Status: AC
  Filled 2015-10-29: qty 1

## 2015-10-29 MED ORDER — PACLITAXEL PROTEIN-BOUND CHEMO INJECTION 100 MG
120.0000 mg/m2 | Freq: Once | INTRAVENOUS | Status: AC
  Administered 2015-10-29: 200 mg via INTRAVENOUS
  Filled 2015-10-29: qty 40

## 2015-10-29 MED ORDER — SODIUM CHLORIDE 0.9 % IV SOLN
1000.0000 mg/m2 | Freq: Once | INTRAVENOUS | Status: AC
  Administered 2015-10-29: 1748 mg via INTRAVENOUS
  Filled 2015-10-29: qty 45.97

## 2015-10-29 NOTE — Patient Instructions (Signed)
[facility] Discharge Instructions for Patients Receiving Chemotherapy  Today you received the following chemotherapy agents:  Abraxane and Gemzar.  To help prevent nausea and vomiting after your treatment, we encourage you to take your nausea medication as prescribed.   If you develop nausea and vomiting that is not controlled by your nausea medication, call the clinic.   BELOW ARE SYMPTOMS THAT SHOULD BE REPORTED IMMEDIATELY:  *FEVER GREATER THAN 100.5 F  *CHILLS WITH OR WITHOUT FEVER  NAUSEA AND VOMITING THAT IS NOT CONTROLLED WITH YOUR NAUSEA MEDICATION  *UNUSUAL SHORTNESS OF BREATH  *UNUSUAL BRUISING OR BLEEDING  TENDERNESS IN MOUTH AND THROAT WITH OR WITHOUT PRESENCE OF ULCERS  *URINARY PROBLEMS  *BOWEL PROBLEMS  UNUSUAL RASH Items with * indicate a potential emergency and should be followed up as soon as possible.  Feel free to call the clinic you have any questions or concerns. The clinic phone number is [phone].  Please show the CHEMO ALERT CARD at check-in to the Emergency Department and triage nurse.   

## 2015-10-30 LAB — CANCER ANTIGEN 19-9: CA 19-9: 381 U/mL — ABNORMAL HIGH (ref 0–35)

## 2015-10-31 ENCOUNTER — Other Ambulatory Visit: Payer: Self-pay | Admitting: Internal Medicine

## 2015-10-31 DIAGNOSIS — E119 Type 2 diabetes mellitus without complications: Secondary | ICD-10-CM

## 2015-10-31 DIAGNOSIS — Z0000 Encounter for general adult medical examination without abnormal findings: Secondary | ICD-10-CM

## 2015-10-31 DIAGNOSIS — E785 Hyperlipidemia, unspecified: Secondary | ICD-10-CM

## 2015-10-31 DIAGNOSIS — I10 Essential (primary) hypertension: Secondary | ICD-10-CM

## 2015-11-01 ENCOUNTER — Ambulatory Visit: Payer: Medicare Other | Admitting: Hematology

## 2015-11-01 ENCOUNTER — Other Ambulatory Visit: Payer: Self-pay | Admitting: Hematology

## 2015-11-04 ENCOUNTER — Encounter: Payer: Self-pay | Admitting: Hematology

## 2015-11-05 ENCOUNTER — Other Ambulatory Visit: Payer: Self-pay | Admitting: Hematology

## 2015-11-07 ENCOUNTER — Telehealth: Payer: Self-pay | Admitting: Hematology

## 2015-11-07 NOTE — Telephone Encounter (Addendum)
Lvm advising 8/17 appt @ 3pm then vm cut off. Mailed appt calendar for 8/18, 8/25, + 9/1.

## 2015-11-11 ENCOUNTER — Encounter: Payer: Self-pay | Admitting: Hematology

## 2015-11-11 ENCOUNTER — Other Ambulatory Visit (HOSPITAL_BASED_OUTPATIENT_CLINIC_OR_DEPARTMENT_OTHER): Payer: Medicare Other

## 2015-11-11 ENCOUNTER — Ambulatory Visit (HOSPITAL_BASED_OUTPATIENT_CLINIC_OR_DEPARTMENT_OTHER): Payer: Medicare Other | Admitting: Hematology

## 2015-11-11 VITALS — BP 123/52 | HR 84 | Temp 97.7°F | Resp 18 | Ht 70.0 in | Wt 126.5 lb

## 2015-11-11 DIAGNOSIS — C259 Malignant neoplasm of pancreas, unspecified: Secondary | ICD-10-CM

## 2015-11-11 DIAGNOSIS — C251 Malignant neoplasm of body of pancreas: Secondary | ICD-10-CM | POA: Diagnosis not present

## 2015-11-11 DIAGNOSIS — E119 Type 2 diabetes mellitus without complications: Secondary | ICD-10-CM | POA: Diagnosis not present

## 2015-11-11 DIAGNOSIS — I10 Essential (primary) hypertension: Secondary | ICD-10-CM

## 2015-11-11 DIAGNOSIS — C61 Malignant neoplasm of prostate: Secondary | ICD-10-CM

## 2015-11-11 LAB — COMPREHENSIVE METABOLIC PANEL
ALT: 16 U/L (ref 0–55)
ANION GAP: 10 meq/L (ref 3–11)
AST: 20 U/L (ref 5–34)
Albumin: 3.1 g/dL — ABNORMAL LOW (ref 3.5–5.0)
Alkaline Phosphatase: 72 U/L (ref 40–150)
BUN: 17.1 mg/dL (ref 7.0–26.0)
CALCIUM: 9.3 mg/dL (ref 8.4–10.4)
CHLORIDE: 103 meq/L (ref 98–109)
CO2: 29 meq/L (ref 22–29)
CREATININE: 1.1 mg/dL (ref 0.7–1.3)
EGFR: 78 mL/min/{1.73_m2} — ABNORMAL LOW (ref >90)
Glucose: 134 mg/dl (ref 70–140)
POTASSIUM: 3.3 meq/L — AB (ref 3.5–5.1)
Sodium: 142 mEq/L (ref 136–145)
Total Bilirubin: 0.35 mg/dL (ref 0.20–1.20)
Total Protein: 6.8 g/dL (ref 6.4–8.3)

## 2015-11-11 LAB — CBC WITH DIFFERENTIAL/PLATELET
BASO%: 0.8 % (ref 0.0–2.0)
BASOS ABS: 0.1 10*3/uL (ref 0.0–0.1)
EOS ABS: 0.4 10*3/uL (ref 0.0–0.5)
EOS%: 4.7 % (ref 0.0–7.0)
HCT: 36.6 % — ABNORMAL LOW (ref 38.4–49.9)
HEMOGLOBIN: 12 g/dL — AB (ref 13.0–17.1)
LYMPH%: 11.5 % — AB (ref 14.0–49.0)
MCH: 26.7 pg — AB (ref 27.2–33.4)
MCHC: 32.8 g/dL (ref 32.0–36.0)
MCV: 81.4 fL (ref 79.3–98.0)
MONO#: 1 10*3/uL — AB (ref 0.1–0.9)
MONO%: 13.2 % (ref 0.0–14.0)
NEUT%: 69.8 % (ref 39.0–75.0)
NEUTROS ABS: 5.5 10*3/uL (ref 1.5–6.5)
PLATELETS: 259 10*3/uL (ref 140–400)
RBC: 4.5 10*6/uL (ref 4.20–5.82)
RDW: 13.8 % (ref 11.0–14.6)
WBC: 7.9 10*3/uL (ref 4.0–10.3)
lymph#: 0.9 10*3/uL (ref 0.9–3.3)

## 2015-11-11 NOTE — Progress Notes (Signed)
[facility]  [phone] [phone]  Clinic follow up Note   Patient Care Team: Binnie Rail, MD as PCP - General (Internal Medicine) [name], MD as Consulting Physician (General Surgery) 11/11/2015  CHIEF COMPLAINTS:  Follow up pancreatic cancer  Oncology History   Primary pancreatic cancer [facility]   Staging form: Pancreas, AJCC 7th Edition     Clinical stage from 09/30/2015: Stage IIB (T2, N1, M0) - Signed by Truitt Merle, MD on 10/07/2015       Primary pancreatic cancer ([place])   09/30/2015 Initial Diagnosis    Primary pancreatic cancer ([place])      09/30/2015 Initial Biopsy     endoscopic pancreatic body mass fine-needle aspiration show malignant cells consistent with adenocarcinoma.      09/30/2015 Procedure     EUS showe a 2.5 cm pancreatic mass causing a stream the dictation of the main pancreatic duct, and directly abutting the SMA an      10/06/2015 Imaging     CT chest, abdomen and  pelvis showed a  4 cm mass in the body of  pancreas, tumor abut the anterior wall of the SMA, a 1.4cm  he pedal duoligament lymph nodes is suspicious, 4mm RUL lung nodule is nonspecific, no other distant mets      10/15/2015 -  Chemotherapy    neoadjuvant chemo gemcitabine 1000mg /m2 and abraxane 120mg /m2, on day 1, 8 every 21 days         HISTORY OF PRESENTING ILLNESS:  [name] 80 y.o. male is here because of His recently diagnosed pancreatic cancer. He presents to the clinic with his wife today.  He has been having abdominal pain for the past one month, worst in the past few weeks, radiates to back, 3-5/10, intermittent, worse with empty stomack, no nausea, bloating, BM is slightly constipated, once a daily. No melana or hematochezia. He has good appetie and eats well, mild fatigue, he is aboe to do his daily activities without difficulty, such as morning his long. He also exercise regularly, bikes a few miles a day. No significant weight loss  recently.  He was seen by his primary care physician, and a CT chest and abdomen was done on 09/20/2015 which showed a 2.7 x 2.5 cm mass in the pancreatic body with associated pancreatic duct dilatation. He was referred to see GI Dr. Ardis Hughs, and underwent EGD and EUS, backward a mass fine-needle biopsy, which showed adenocarcinoma.  He was diagnosed with prosttae can 5 years ago, has been monitored, no treatment, he has low PSA level.  He is diabetic, he is to be on metformin, has been off for a while because his sugar has been relatively controlled with diet alone. His recent HbA1c was some 7.9 in 05/2015.   CURRENT THERAPY: neoadjuvant chemo gemcitabine 1000mg /m2 and abraxane 120mg /m2, on day 1, 8 and 15 every 28 days   INTERIM HISTORY: [name] returns for follow up. He tolerated the first cycle very well overall, he had mild fatigue, nausea and slightly decreased appetite after chemo, but recovers well. He felt overall much better when he was off chemo last week. His abdominal pain has resolved. No other complains.   MEDICAL HISTORY:  Past Medical History:  Diagnosis Date  . Arthritis    R hip and shoulder  . Complication of anesthesia    Local anesthetic used in office for Prostate biopsy" reaction Tachycardia,nausea, hallucinations, Blood pressure elevated"- No problems once done at hospital with anesthesia" Thinks Ciprofloxacin  was the injection"   . Diabetes mellitus   . Enlarged prostate   . Femoral bruit    R femoral  . GERD with stricture    PMH of  . Hyperlipidemia   . Hypertension   . Macular degeneration    glaucoma suspect  . PONV (postoperative nausea and vomiting)   . Prostate cancer [facility]    prostate ; Dr Wyvonnia Lora to be monitored x 5 yrs now.  . Squamous papilloma    of esophagus    SURGICAL HISTORY: Past Surgical History:  Procedure Laterality Date  . ANKLE FUSION  2004  . COLONOSCOPY  05/2012  . esophageal dilation  2002   Dr  Lucio Edward  .  ESOPHAGOGASTRODUODENOSCOPY (EGD) WITH PROPOFOL N/A 09/30/2015   Procedure: ESOPHAGOGASTRODUODENOSCOPY (EGD) WITH PROPOFOL;  Surgeon: Milus Banister, MD;  Location: [facility];  Service: Endoscopy;  Laterality: N/A;  . EUS N/A 09/30/2015   Procedure: UPPER ENDOSCOPIC ULTRASOUND (EUS) RADIAL;  Surgeon: Milus Banister, MD;  Location: [facility];  Service: Endoscopy;  Laterality: N/A;  . FINE NEEDLE ASPIRATION N/A 09/30/2015   Procedure: FINE NEEDLE ASPIRATION (FNA) LINEAR;  Surgeon: Milus Banister, MD;  Location: [facility];  Service: Endoscopy;  Laterality: N/A;  . HERNIA REPAIR     bil inguinal hernias  . PARTIAL COLECTOMY     perforated diverticulitis  . PORTACATH PLACEMENT Left 10/13/2015   Procedure: INSERTION PORT-A-CATH;  Surgeon: [name], MD;  Location: [facility];  Service: General;  Laterality: Left;  . PROSTATE BIOPSY  03/29/2012   Procedure: BIOPSY TRANSRECTAL ULTRASONIC PROSTATE (TUBP);  Surgeon: Fredricka Bonine, MD;  [facility];  Service: Urology;  Laterality: N/A;  . ROTATOR CUFF REPAIR  2006  . TOTAL HIP ARTHROPLASTY Left 2012    SOCIAL HISTORY: Social History   Social History  . Marital status: Married    Spouse name: Deloris  . Number of children: 1  . Years of education: N/A   Occupational History  . Retired     Ecologist in [place]  . Smoking status: Former Smoker    Packs/day: 0.50    Years: 40.00    Types: Cigarettes    Quit date: 03/27/1989  . Smokeless tobacco: Never Used     Comment: smoked [place], up to 1 ppd  . Alcohol use No  . Drug use: No  . Sexual activity: Not on file   Other Topics Concern  . 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 He and his wife moved to [place] to be closer to his son.  He has one son and one Granddaughter who is 19.  FAMILY HISTORY: Family History  Problem Relation Age of Onset  . Kidney disease Father     ? Rheumatic fever as child  . Hypertension Father   . Heart attack Paternal Uncle     in 64s  . COPD Paternal Uncle     due to exposures in [facility]  . Kidney disease Paternal Aunt     ? Rheumatic fever as child  . Cancer Neg Hx   . Diabetes Neg  Hx   . Stroke Neg Hx   . Colon cancer Neg Hx     ALLERGIES:  is allergic to ace inhibitors; angiotensin receptor blockers; and ciprofloxacin.  MEDICATIONS:  Current Outpatient Prescriptions  Medication Sig Dispense Refill  . amLODipine (NORVASC) 10 MG tablet Take 1 tablet (10 mg total) by mouth daily. 90 tablet 3  . aspirin 81 MG tablet Take 1 tablet (81 mg total) by mouth daily. 30 tablet   . doxazosin (CARDURA) 1 MG tablet TAKE 1 TABLET BY MOUTH EVERY DAY 90 tablet 2  . glucose blood (ONETOUCH VERIO) test strip Check blood sugar once daily 100 each 12  . hydrochlorothiazide (MICROZIDE) 12.5 MG capsule Take 1 capsule (12.5 mg total) by mouth daily. 90 capsule 3  . latanoprost (XALATAN) 0.005 % ophthalmic solution Place 1 drop into both eyes at bedtime.  1  . lidocaine-prilocaine (EMLA) cream Apply 1 application topically as needed. Apply to pac site 1 hour prior to stick and cover with plastic wrap 30 g 11  . metFORMIN (GLUCOPHAGE) 500 MG tablet Take 1 tablet (500 mg total) by mouth 2 (two) times daily with a meal. 180 tablet 3  . multivitamin-lutein (OCUVITE-LUTEIN) CAPS Take 1 capsule by mouth daily.    . naproxen sodium (ANAPROX) 220 MG tablet Take 220 mg by mouth 2 (two) times daily with a meal.    . ondansetron (ZOFRAN) 8 MG tablet Take 1 tablet (8 mg total) by mouth 2 (two) times daily as needed (Nausea or vomiting). (Patient not taking: Reported on 10/11/2015) 30 tablet 1  . ONETOUCH DELICA LANCETS 99991111 MISC Use to  check blood sugars once a day Dx E11.9 100 each 3  . ONETOUCH VERIO test strip CHECK BLOOD SUGAR ONCE DAILY. DX250.00 100 each 5  . oxyCODONE (OXY IR/ROXICODONE) 5 MG immediate release tablet Take 1-2 tablets (5-10 mg total) by mouth every 6 (six) hours as needed for moderate pain, severe pain or breakthrough pain. 30 tablet 0  . pravastatin (PRAVACHOL) 20 MG tablet Take 1 tablet (20 mg total) by mouth at bedtime. 90 tablet 3  . prochlorperazine (COMPAZINE) 10 MG tablet Take 1 tablet (10 mg total) by mouth every 6 (six) hours as needed (Nausea or vomiting). (Patient not taking: Reported on 10/11/2015) 30 tablet 1   No current facility-administered medications for this visit.     REVIEW OF SYSTEMS:   Constitutional: Denies fevers, chills or abnormal night sweats Eyes: Denies blurriness of vision, double vision or watery eyes Ears, nose, mouth, throat, and face: Denies mucositis or sore throat Respiratory: Denies cough, dyspnea or wheezes Cardiovascular: Denies palpitation, chest discomfort or lower extremity swelling Gastrointestinal:  Denies nausea, heartburn or change in bowel habits, (+) epigastric pain Skin: Denies abnormal skin rashes Lymphatics: Denies new lymphadenopathy or easy bruising Neurological:Denies numbness, tingling or new weaknesses Behavioral/Psych: Mood is stable, no new changes  All other systems were reviewed with the patient and are negative.  PHYSICAL EXAMINATION: ECOG PERFORMANCE STATUS: 1 - Symptomatic but completely ambulatory  Vitals:   11/11/15 1539  BP: (!) 123/52  Pulse: 84  Resp: 18  Temp: 97.7 F (36.5 C)   Filed Weights   11/11/15 1539  Weight: 126 lb 8 oz (57.4 kg)    GENERAL:alert, no distress and comfortable SKIN: skin color, texture, turgor are normal, no rashes or significant lesions EYES: normal, conjunctiva are pink and non-injected, sclera clear OROPHARYNX:no exudate, no erythema and lips, buccal mucosa, and tongue normal  NECK:  supple, thyroid  normal size, non-tender, without nodularity LYMPH:  no palpable lymphadenopathy in the cervical, axillary or inguinal LUNGS: clear to auscultation and percussion with normal breathing effort HEART: regular rate & rhythm and no murmurs and no lower extremity edema ABDOMEN:abdomen soft, non-tender and normal bowel sounds Musculoskeletal:no cyanosis of digits and no clubbing  PSYCH: alert & oriented x 3 with fluent speech NEURO: no focal motor/sensory deficits  LABORATORY DATA:  I have reviewed the data as listed CBC Latest Ref Rng & Units 11/11/2015 10/29/2015 10/22/2015  WBC 4.0 - 10.3 10e3/uL 7.9 4.0 3.3(L)  Hemoglobin 13.0 - 17.1 g/dL 12.0(L) 12.8(L) 13.0  Hematocrit 38.4 - 49.9 % 36.6(L) 39.4 39.8  Platelets 140 - 400 10e3/uL 259 129(L) 145   CMP Latest Ref Rng & Units 11/11/2015 10/29/2015 10/22/2015  Glucose 70 - 140 mg/dl 134 205(H) 308(H)  BUN 7.0 - 26.0 mg/dL 17.1 19.7 22.2  Creatinine 0.7 - 1.3 mg/dL 1.1 1.1 1.3  Sodium 136 - 145 mEq/L 142 140 139  Potassium 3.5 - 5.1 mEq/L 3.3(L) 3.5 3.8  Chloride 96 - 112 mEq/L - - -  CO2 22 - 29 mEq/L 29 25 26   Calcium 8.4 - 10.4 mg/dL 9.3 9.3 8.9  Total Protein 6.4 - 8.3 g/dL 6.8 7.1 6.9  Total Bilirubin 0.20 - 1.20 mg/dL 0.35 <0.30 0.39  Alkaline Phos 40 - 150 U/L 72 70 63  AST 5 - 34 U/L 20 21 26   ALT 0 - 55 U/L 16 22 25    CA19.9 (0-35U/ml) 09/30/2015: 370 10/07/2015: 516 10/29/2015: 381   PATHOLOGY REPORT Diagnosis 09/30/2015 FINE NEEDLE ASPIRATION, ENDOSCOPIC PANCREATIC BODY (SPECIMEN 1 OF 1 COLLECTED 09/30/2015) MALIGNANT CELLS PRESENT, CONSISTENT WITH ADENOCARCINOMA. SEE COMMENT. COMMENT: THE CASE WAS DISCUSSED WITH DR. Ardis Hughs ON 10/01/2015.  RADIOGRAPHIC STUDIES: I have personally reviewed the radiological images as listed and agreed with the findings in the report. Dg Chest Port 1 View  Result Date: 10/13/2015 CLINICAL DATA:  Port-A-Cath placement.  Pancreatic carcinoma EXAM: DG C-ARM 1-60 MIN-NO REPORT; PORTABLE  CHEST - 1 VIEW COMPARISON:  Chest radiograph November 30, 2009 and chest CT October 06, 2015 FINDINGS: Port-A-Cath tip is in the superior vena cava in. No pneumothorax. No edema or consolidation. Heart size and pulmonary vascularity are normal. No adenopathy. There is atherosclerotic calcification in the proximal descending thoracic aorta. There are no demonstrable blastic or lytic bone lesions evident. IMPRESSION: Port-A-Cath tip in superior vena cava. No pneumothorax. No edema or consolidation. There is aortic atherosclerosis. Electronically Signed   By: Lowella Grip III M.D.   On: 10/13/2015 11:53   Dg C-arm 1-60 Min-no Report  Result Date: 10/13/2015 CLINICAL DATA:  Port-A-Cath placement.  Pancreatic carcinoma EXAM: DG C-ARM 1-60 MIN-NO REPORT; PORTABLE CHEST - 1 VIEW COMPARISON:  Chest radiograph November 30, 2009 and chest CT October 06, 2015 FINDINGS: Port-A-Cath tip is in the superior vena cava in. No pneumothorax. No edema or consolidation. Heart size and pulmonary vascularity are normal. No adenopathy. There is atherosclerotic calcification in the proximal descending thoracic aorta. There are no demonstrable blastic or lytic bone lesions evident. IMPRESSION: Port-A-Cath tip in superior vena cava. No pneumothorax. No edema or consolidation. There is aortic atherosclerosis. Electronically Signed   By: Lowella Grip III M.D.   On: 10/13/2015 11:53  Upper EUS 09/30/2015 Dr. Ardis Hughs  Endosonographic Finding 1. An irregular mass was identified in the pancreatic body. The mass was hypoechoic. The mass measured 25 mm in maximal cross-sectional diameter. The endosonographic borders were poorlydefined. The mass directly  abuts the SMA for about 1.5cm and also abuts the Celiac trunk for a short distance. The remainder of the pancreatic parenchyma was normal. Upstream from the mass, the main pancreatic duct was dilated, a tortuous/ectatic duct and a maximum duct diameter of 5 mm. Fine needle aspiration for  cytology was performed. Color Doppler imaging was utilized prior to needle puncture to confirm a lack of significant vascular structures within the needle path. Three passes were made with the 25 gauge needle using a transgastric approach. Some passes were made with a stylet. A cytotechnologist was present to evaluate the adequacy of the specimen. 2. No peripancreatic adenopathy 3. CBD was normal, non-dilated 4. Gallbladder was normal 5. Limited views of liver, spleen, portal and splenic vessels were all normal.  - Schatzki's ring (vs focal peptic stricture) at the GE junction - 2.5cm mass in the body of pancreas causing upstream dilation of the main pancreatic duct, directly abutting the SMA and celiac trunk. Preliminary cytology review from the mass was positive for malignancy (likely adenocarcinoma)   ASSESSMENT & PLAN: 80 year old African-American male, presented with epigastric pain for a month.  1. Primary pancreatic cancer, adenocarcinoma in the body of pancreas,cT2N1M0, stage IIB  -I reviewed his CT scan findings, EUS findings and pancreatic mass biopsy pathology results with pt and his wife in details. -His CT scan showed no evidence of distant metastasis, there is a 14 mm hepatoduodenal node on CT which is suspicious for nodal metastasis, EUS was negative for peripancreatic adenopathy.  -CT abdomen with contrast (pancreatic protocol) showed the pancreatic mass abuts the SMA. His images were reviewed in our tumor board yesterday, Dr. Barry Dienes feels his pancreas cancer is borderline resectable, and recommend neoadjuvant chemo. -We discussed the role of chemotherapy before and after surgery. We reviewed the data of different chemotherapy regimens in pancreatic cancer, including single agent gemcitabine, gemcitabine and Abraxane, and FOLFIRINOX. Given his advanced age, but preserved performance status and limited medical comorbidities, I recommend him to have neoadjuvant chemotherapy with  gemcitabine and Abraxane weekly, 3 weeks on, one-week off.  -He tolerated the first cycle chemo very well, his abdominal pain has resolved, CA19.9 has decreased, he is likely responding to chemo.  -He had no significant cytopenia after chemo. Lab resolved reviewed, he will start cycle 2 chemo tomorrow  -Plan to repeat CT scan after second cycle chemo   2. DM -He is not on medication, however his recent HbA1c was 7.9 in 05/2015 -I recommend him to see his primary care physician Dr. Quay Burow, and consider restart metformin which he was on before -We discussed the impact of chemotherapy, especially premedication dexamethasone, on his blood glucose. We will try to use minimal or no steroids before chemotherapy duties diabetes  3. Abdominal pain -secondary to #1 -resolved after he started chemo   4. Prostate cancer, untreated  -His prostate cancer has been monitored by his urologist, treatment was not recommended -will follow up clinically   Plan -Lab reviewed, adequate for treatment, we'll proceed with cycle 2 day 1 chemo tomorrow, and continue weekly for 2 more weeks -repeat CT scan in 3-4 weeks and presented in GI tumor board after scan  -I will see him before cycle 3   All questions were answered. The patient knows to call the clinic with any problems, questions or concerns. I spent 25 minutes counseling the patient face to face. The total time spent in the appointment was 30 minutes and more than 50% was on counseling.  Truitt Merle, MD 11/11/2015 11:25 PM

## 2015-11-12 ENCOUNTER — Ambulatory Visit (HOSPITAL_BASED_OUTPATIENT_CLINIC_OR_DEPARTMENT_OTHER): Payer: Medicare Other

## 2015-11-12 VITALS — BP 122/61 | HR 86 | Temp 98.9°F | Resp 17 | Ht 70.0 in

## 2015-11-12 DIAGNOSIS — Z5111 Encounter for antineoplastic chemotherapy: Secondary | ICD-10-CM

## 2015-11-12 DIAGNOSIS — C251 Malignant neoplasm of body of pancreas: Secondary | ICD-10-CM

## 2015-11-12 DIAGNOSIS — C259 Malignant neoplasm of pancreas, unspecified: Secondary | ICD-10-CM

## 2015-11-12 MED ORDER — PACLITAXEL PROTEIN-BOUND CHEMO INJECTION 100 MG
120.0000 mg/m2 | Freq: Once | INTRAVENOUS | Status: AC
  Administered 2015-11-12: 200 mg via INTRAVENOUS
  Filled 2015-11-12: qty 20

## 2015-11-12 MED ORDER — SODIUM CHLORIDE 0.9 % IV SOLN
1000.0000 mg/m2 | Freq: Once | INTRAVENOUS | Status: AC
  Administered 2015-11-12: 1748 mg via INTRAVENOUS
  Filled 2015-11-12: qty 45.97

## 2015-11-12 MED ORDER — SODIUM CHLORIDE 0.9% FLUSH
10.0000 mL | INTRAVENOUS | Status: DC | PRN
  Administered 2015-11-12: 10 mL
  Filled 2015-11-12: qty 10

## 2015-11-12 MED ORDER — SODIUM CHLORIDE 0.9 % IV SOLN
Freq: Once | INTRAVENOUS | Status: AC
  Administered 2015-11-12: 14:00:00 via INTRAVENOUS

## 2015-11-12 MED ORDER — PROCHLORPERAZINE MALEATE 10 MG PO TABS
ORAL_TABLET | ORAL | Status: AC
  Filled 2015-11-12: qty 1

## 2015-11-12 MED ORDER — PROCHLORPERAZINE MALEATE 10 MG PO TABS
10.0000 mg | ORAL_TABLET | Freq: Once | ORAL | Status: AC
  Administered 2015-11-12: 10 mg via ORAL

## 2015-11-12 MED ORDER — HEPARIN SOD (PORK) LOCK FLUSH 100 UNIT/ML IV SOLN
500.0000 [IU] | Freq: Once | INTRAVENOUS | Status: AC | PRN
  Administered 2015-11-12: 500 [IU]
  Filled 2015-11-12: qty 5

## 2015-11-12 NOTE — Patient Instructions (Addendum)
[place] Discharge Instructions for Patients Receiving Chemotherapy  Today you received the following chemotherapy agents:  Abraxane  To help prevent nausea and vomiting after your treatment, we encourage you to take your nausea medication as ordered per MD.   If you develop nausea and vomiting that is not controlled by your nausea medication, call the clinic.   BELOW ARE SYMPTOMS THAT SHOULD BE REPORTED IMMEDIATELY:  *FEVER GREATER THAN 100.5 F  *CHILLS WITH OR WITHOUT FEVER  NAUSEA AND VOMITING THAT IS NOT CONTROLLED WITH YOUR NAUSEA MEDICATION  *UNUSUAL SHORTNESS OF BREATH  *UNUSUAL BRUISING OR BLEEDING  TENDERNESS IN MOUTH AND THROAT WITH OR WITHOUT PRESENCE OF ULCERS  *URINARY PROBLEMS  *BOWEL PROBLEMS  UNUSUAL RASH Items with * indicate a potential emergency and should be followed up as soon as possible.  Feel free to call the clinic you have any questions or concerns. The clinic phone number is [phone].  Please show the [place] at check-in to the Emergency Department and triage nurse.  Potassium Content of Foods Potassium is a mineral found in many foods and drinks. It helps keep fluids and minerals balanced in your body and affects how steadily your heart beats. Potassium also helps control your blood pressure and keep your muscles and nervous system healthy. Certain health conditions and medicines may change the balance of potassium in your body. When this happens, you can help balance your level of potassium through the foods that you do or do not eat. Your health care provider or dietitian may recommend an amount of potassium that you should have each day. The following lists of foods provide the amount of potassium (in parentheses) per serving in each item. HIGH IN POTASSIUM  The following foods and beverages have 200 mg or more of potassium per serving:  Apricots, 2 raw or 5 dry (200 mg).  Artichoke, 1 medium (345  mg).  Avocado, raw,  each (245 mg).  Banana, 1 medium (425 mg).  Beans, lima, or baked beans, canned,  cup (280 mg).  Beans, white, canned,  cup (595 mg).  Beef roast, 3 oz (320 mg).  Beef, ground, 3 oz (270 mg).  Beets, raw or cooked,  cup (260 mg).  Bran muffin, 2 oz (300 mg).  Broccoli,  cup (230 mg).  Brussels sprouts,  cup (250 mg).  Cantaloupe,  cup (215 mg).  Cereal, 100% bran,  cup (200-400 mg).  Cheeseburger, single, fast food, 1 each (225-400 mg).  Chicken, 3 oz (220 mg).  Clams, canned, 3 oz (535 mg).  Crab, 3 oz (225 mg).  Dates, 5 each (270 mg).  Dried beans and peas,  cup (300-475 mg).  Figs, dried, 2 each (260 mg).  Fish: halibut, tuna, cod, snapper, 3 oz (480 mg).  Fish: salmon, haddock, swordfish, perch, 3 oz (300 mg).  Fish, tuna, canned 3 oz (200 mg).  [place] fries, fast food, 3 oz (470 mg).  Granola with fruit and nuts,  cup (200 mg).  Grapefruit juice,  cup (200 mg).  Greens, beet,  cup (655 mg).  Honeydew melon,  cup (200 mg).  Kale, raw, 1 cup (300 mg).  Kiwi, 1 medium (240 mg).  Kohlrabi, rutabaga, parsnips,  cup (280 mg).  Lentils,  cup (365 mg).  Mango, 1 each (325 mg).  Milk, chocolate, 1 cup (420 mg).  Milk: nonfat, low-fat, whole, buttermilk, 1 cup (350-380 mg).  Molasses, 1 Tbsp (295 mg).  Mushrooms,  cup (280) mg.  Nectarine,  1 each (275 mg).  Nuts: almonds, peanuts, hazelnuts, [place], cashew, mixed, 1 oz (200 mg).  Nuts, pistachios, 1 oz (295 mg).  Orange, 1 each (240 mg).  Orange juice,  cup (235 mg).  Papaya, medium,  fruit (390 mg).  Peanut butter, chunky, 2 Tbsp (240 mg).  Peanut butter, smooth, 2 Tbsp (210 mg).  Pear, 1 medium (200 mg).  Pomegranate, 1 whole (400 mg).  Pomegranate juice,  cup (215 mg).  Pork, 3 oz (350 mg).  Potato chips, salted, 1 oz (465 mg).  Potato, baked with skin, 1 medium (925 mg).  Potatoes, boiled,  cup (255 mg).  Potatoes, mashed,   cup (330 mg).  Prune juice,  cup (370 mg).  Prunes, 5 each (305 mg).  Pudding, chocolate,  cup (230 mg).  Pumpkin, canned,  cup (250 mg).  Raisins, seedless,  cup (270 mg).  Seeds, sunflower or pumpkin, 1 oz (240 mg).  Soy milk, 1 cup (300 mg).  Spinach,  cup (420 mg).  Spinach, canned,  cup (370 mg).  Sweet potato, baked with skin, 1 medium (450 mg).  Swiss chard,  cup (480 mg).  Tomato or vegetable juice,  cup (275 mg).  Tomato sauce or puree,  cup (400-550 mg).  Tomato, raw, 1 medium (290 mg).  Tomatoes, canned,  cup (200-300 mg).  [place], 3 oz (250 mg).  Wheat germ, 1 oz (250 mg).  Winter squash,  cup (250 mg).  Yogurt, plain or fruited, 6 oz (260-435 mg).  Zucchini,  cup (220 mg). MODERATE IN POTASSIUM The following foods and beverages have 50-200 mg of potassium per serving:  Apple, 1 each (150 mg).  Apple juice,  cup (150 mg).  Applesauce,  cup (90 mg).  Apricot nectar,  cup (140 mg).  Asparagus, small spears,  cup or 6 spears (155 mg).  Bagel, cinnamon raisin, 1 each (130 mg).  Bagel, egg or plain, 4 in., 1 each (70 mg).  Beans, green,  cup (90 mg).  Beans, yellow,  cup (190 mg).  Beer, regular, 12 oz (100 mg).  Beets, canned,  cup (125 mg).  Blackberries,  cup (115 mg).  Blueberries,  cup (60 mg).  Bread, whole wheat, 1 slice (70 mg).  Broccoli, raw,  cup (145 mg).  Cabbage,  cup (150 mg).  Carrots, cooked or raw,  cup (180 mg).  Cauliflower, raw,  cup (150 mg).  Celery, raw,  cup (155 mg).  Cereal, bran flakes, cup (120-150 mg).  Cheese, cottage,  cup (110 mg).  Cherries, 10 each (150 mg).  Chocolate, 1 oz bar (165 mg).  Coffee, brewed 6 oz (90 mg).  Corn,  cup or 1 ear (195 mg).  Cucumbers,  cup (80 mg).  Egg, large, 1 each (60 mg).  Eggplant,  cup (60 mg).  Endive, raw, cup (80 mg).  English muffin, 1 each (65 mg).  Fish, orange roughy, 3 oz (150 mg).  Frankfurter,  beef or pork, 1 each (75 mg).  Fruit cocktail,  cup (115 mg).  Grape juice,  cup (170 mg).  Grapefruit,  fruit (175 mg).  Grapes,  cup (155 mg).  Greens: kale, turnip, collard,  cup (110-150 mg).  Ice cream or frozen yogurt, chocolate,  cup (175 mg).  Ice cream or frozen yogurt, vanilla,  cup (120-150 mg).  Lemons, limes, 1 each (80 mg).  Lettuce, all types, 1 cup (100 mg).  Mixed vegetables,  cup (150 mg).  Mushrooms, raw,  cup (110 mg).  Nuts: walnuts,  pecans, or macadamia, 1 oz (125 mg).  Oatmeal,  cup (80 mg).  Okra,  cup (110 mg).  Onions, raw,  cup (120 mg).  Peach, 1 each (185 mg).  Peaches, canned,  cup (120 mg).  Pears, canned,  cup (120 mg).  Peas, green, frozen,  cup (90 mg).  Peppers, green,  cup (130 mg).  Peppers, red,  cup (160 mg).  Pineapple juice,  cup (165 mg).  Pineapple, fresh or canned,  cup (100 mg).  Plums, 1 each (105 mg).  Pudding, vanilla,  cup (150 mg).  Raspberries,  cup (90 mg).  Rhubarb,  cup (115 mg).  Rice, wild,  cup (80 mg).  Shrimp, 3 oz (155 mg).  Spinach, raw, 1 cup (170 mg).  Strawberries,  cup (125 mg).  Summer squash  cup (175-200 mg).  Swiss chard, raw, 1 cup (135 mg).  Tangerines, 1 each (140 mg).  Tea, brewed, 6 oz (65 mg).  Turnips,  cup (140 mg).  Watermelon,  cup (85 mg).  Wine, red, table, 5 oz (180 mg).  Wine, white, table, 5 oz (100 mg). LOW IN POTASSIUM The following foods and beverages have less than 50 mg of potassium per serving.  Bread, white, 1 slice (30 mg).  Carbonated beverages, 12 oz (less than 5 mg).  Cheese, 1 oz (20-30 mg).  Cranberries,  cup (45 mg).  Cranberry juice cocktail,  cup (20 mg).  Fats and oils, 1 Tbsp (less than 5 mg).  Hummus, 1 Tbsp (32 mg).  Nectar: papaya, mango, or pear,  cup (35 mg).  Rice, white or brown,  cup (50 mg).  Spaghetti or macaroni,  cup cooked (30 mg).  Tortilla, flour or corn, 1 each (50  mg).  Waffle, 4 in., 1 each (50 mg).  Water chestnuts,  cup (40 mg).   This information is not intended to replace advice given to you by your health care provider. Make sure you discuss any questions you have with your health care provider.   Document Released: 10/25/2004 Document Revised: 03/18/2013 Document Reviewed: 02/07/2013 Elsevier Interactive Patient Education [employer].

## 2015-11-15 ENCOUNTER — Telehealth: Payer: Self-pay | Admitting: Hematology

## 2015-11-15 DIAGNOSIS — C61 Malignant neoplasm of prostate: Secondary | ICD-10-CM | POA: Diagnosis not present

## 2015-11-15 NOTE — Telephone Encounter (Signed)
Appt conf with patient. 11/15/15 °

## 2015-11-19 ENCOUNTER — Ambulatory Visit (HOSPITAL_BASED_OUTPATIENT_CLINIC_OR_DEPARTMENT_OTHER): Payer: Medicare Other

## 2015-11-19 ENCOUNTER — Other Ambulatory Visit (HOSPITAL_BASED_OUTPATIENT_CLINIC_OR_DEPARTMENT_OTHER): Payer: Medicare Other

## 2015-11-19 ENCOUNTER — Encounter: Payer: Self-pay | Admitting: *Deleted

## 2015-11-19 VITALS — BP 136/60 | HR 81 | Temp 98.2°F | Resp 18

## 2015-11-19 DIAGNOSIS — C251 Malignant neoplasm of body of pancreas: Secondary | ICD-10-CM | POA: Diagnosis present

## 2015-11-19 DIAGNOSIS — C259 Malignant neoplasm of pancreas, unspecified: Secondary | ICD-10-CM

## 2015-11-19 DIAGNOSIS — Z5111 Encounter for antineoplastic chemotherapy: Secondary | ICD-10-CM | POA: Diagnosis present

## 2015-11-19 LAB — COMPREHENSIVE METABOLIC PANEL
ALK PHOS: 81 U/L (ref 40–150)
ALT: 22 U/L (ref 0–55)
AST: 25 U/L (ref 5–34)
Albumin: 3.4 g/dL — ABNORMAL LOW (ref 3.5–5.0)
Anion Gap: 12 mEq/L — ABNORMAL HIGH (ref 3–11)
BUN: 18.1 mg/dL (ref 7.0–26.0)
CHLORIDE: 102 meq/L (ref 98–109)
CO2: 28 mEq/L (ref 22–29)
Calcium: 10 mg/dL (ref 8.4–10.4)
Creatinine: 1.1 mg/dL (ref 0.7–1.3)
EGFR: 78 mL/min/{1.73_m2} — AB (ref >90)
GLUCOSE: 106 mg/dL (ref 70–140)
POTASSIUM: 3.6 meq/L (ref 3.5–5.1)
SODIUM: 142 meq/L (ref 136–145)
Total Bilirubin: 0.37 mg/dL (ref 0.20–1.20)
Total Protein: 7.4 g/dL (ref 6.4–8.3)

## 2015-11-19 LAB — CBC WITH DIFFERENTIAL/PLATELET
BASO%: 0.9 % (ref 0.0–2.0)
BASOS ABS: 0 10*3/uL (ref 0.0–0.1)
EOS ABS: 0.2 10*3/uL (ref 0.0–0.5)
EOS%: 4 % (ref 0.0–7.0)
HCT: 35.9 % — ABNORMAL LOW (ref 38.4–49.9)
HGB: 12.2 g/dL — ABNORMAL LOW (ref 13.0–17.1)
LYMPH%: 26.7 % (ref 14.0–49.0)
MCH: 27.3 pg (ref 27.2–33.4)
MCHC: 34 g/dL (ref 32.0–36.0)
MCV: 80.3 fL (ref 79.3–98.0)
MONO#: 0.4 10*3/uL (ref 0.1–0.9)
MONO%: 8.4 % (ref 0.0–14.0)
NEUT#: 2.7 10*3/uL (ref 1.5–6.5)
NEUT%: 60 % (ref 39.0–75.0)
Platelets: 351 10*3/uL (ref 140–400)
RBC: 4.47 10*6/uL (ref 4.20–5.82)
RDW: 15.1 % — ABNORMAL HIGH (ref 11.0–14.6)
WBC: 4.5 10*3/uL (ref 4.0–10.3)
lymph#: 1.2 10*3/uL (ref 0.9–3.3)

## 2015-11-19 MED ORDER — PACLITAXEL PROTEIN-BOUND CHEMO INJECTION 100 MG
120.0000 mg/m2 | Freq: Once | INTRAVENOUS | Status: AC
  Administered 2015-11-19: 200 mg via INTRAVENOUS
  Filled 2015-11-19: qty 40

## 2015-11-19 MED ORDER — PROCHLORPERAZINE MALEATE 10 MG PO TABS
10.0000 mg | ORAL_TABLET | Freq: Once | ORAL | Status: AC
  Administered 2015-11-19: 10 mg via ORAL

## 2015-11-19 MED ORDER — HEPARIN SOD (PORK) LOCK FLUSH 100 UNIT/ML IV SOLN
500.0000 [IU] | Freq: Once | INTRAVENOUS | Status: AC | PRN
  Administered 2015-11-19: 500 [IU]
  Filled 2015-11-19: qty 5

## 2015-11-19 MED ORDER — PROCHLORPERAZINE MALEATE 10 MG PO TABS
ORAL_TABLET | ORAL | Status: AC
  Filled 2015-11-19: qty 1

## 2015-11-19 MED ORDER — SODIUM CHLORIDE 0.9% FLUSH
10.0000 mL | INTRAVENOUS | Status: DC | PRN
  Administered 2015-11-19: 10 mL
  Filled 2015-11-19: qty 10

## 2015-11-19 MED ORDER — SODIUM CHLORIDE 0.9 % IV SOLN
1000.0000 mg/m2 | Freq: Once | INTRAVENOUS | Status: AC
  Administered 2015-11-19: 1748 mg via INTRAVENOUS
  Filled 2015-11-19: qty 45.97

## 2015-11-19 MED ORDER — SODIUM CHLORIDE 0.9 % IV SOLN
Freq: Once | INTRAVENOUS | Status: AC
  Administered 2015-11-19: 13:00:00 via INTRAVENOUS

## 2015-11-19 NOTE — Patient Instructions (Signed)
[facility] Discharge Instructions for Patients Receiving Chemotherapy  Today you received the following chemotherapy agents: Abraxane and Gemzar   To help prevent nausea and vomiting after your treatment, we encourage you to take your nausea medication as directed.    If you develop nausea and vomiting that is not controlled by your nausea medication, call the clinic.   BELOW ARE SYMPTOMS THAT SHOULD BE REPORTED IMMEDIATELY:  *FEVER GREATER THAN 100.5 F  *CHILLS WITH OR WITHOUT FEVER  NAUSEA AND VOMITING THAT IS NOT CONTROLLED WITH YOUR NAUSEA MEDICATION  *UNUSUAL SHORTNESS OF BREATH  *UNUSUAL BRUISING OR BLEEDING  TENDERNESS IN MOUTH AND THROAT WITH OR WITHOUT PRESENCE OF ULCERS  *URINARY PROBLEMS  *BOWEL PROBLEMS  UNUSUAL RASH Items with * indicate a potential emergency and should be followed up as soon as possible.  Feel free to call the clinic you have any questions or concerns. The clinic phone number is [phone].  Please show the CHEMO ALERT CARD at check-in to the Emergency Department and triage nurse.   

## 2015-11-19 NOTE — Progress Notes (Signed)
Oncology Nurse Navigator Documentation  Oncology Nurse Navigator Flowsheets 11/19/2015  Navigator Location [facility]  Navigator Encounter Type Treatment  Telephone -  Abnormal Finding Date 09/20/2015  Confirmed Diagnosis Date 09/30/2015  Treatment Initiated Date 10/15/2015  Patient Visit Type MedOnc  Treatment Phase Active Tx--Gemzar/Abraxane  Barriers/Navigation Needs No barriers at this time;No Questions;No Needs  Education -  Interventions None required  Referrals -  Coordination of Care -  Education Method -  Support Groups/Services -  Acuity Level 1  Time Spent with Patient 15

## 2015-11-22 ENCOUNTER — Telehealth: Payer: Self-pay | Admitting: *Deleted

## 2015-11-22 DIAGNOSIS — N400 Enlarged prostate without lower urinary tract symptoms: Secondary | ICD-10-CM | POA: Diagnosis not present

## 2015-11-22 DIAGNOSIS — C61 Malignant neoplasm of prostate: Secondary | ICD-10-CM | POA: Diagnosis not present

## 2015-11-22 NOTE — Telephone Encounter (Signed)
Oncology Nurse Navigator Documentation  Oncology Nurse Navigator Flowsheets 11/22/2015  Navigator Location [facility]  Navigator Encounter Type Telephone  Telephone Appt Confirmation/Clarification--asking why he has "flush" appointment on schedule for next tx? Has never done this before.  Abnormal Finding Date -  Confirmed Diagnosis Date -  Treatment Initiated Date -  Patient Visit Type -  Treatment Phase -  Barriers/Navigation Needs Coordination of Care--explained the reason for flush appointment. He verbalizes he prefers the lab peripheral and have port accessed in tx area.  Education -  Interventions Coordination of Care--cancelled flush and changed his lab appointment  Referrals -  Coordination of Care Appts  Education Method -  Support Groups/Services -  Acuity -  Time Spent with Patient 15

## 2015-11-26 ENCOUNTER — Ambulatory Visit: Payer: Medicare Other | Admitting: Nutrition

## 2015-11-26 ENCOUNTER — Other Ambulatory Visit (HOSPITAL_BASED_OUTPATIENT_CLINIC_OR_DEPARTMENT_OTHER): Payer: Medicare Other

## 2015-11-26 ENCOUNTER — Inpatient Hospital Stay (HOSPITAL_BASED_OUTPATIENT_CLINIC_OR_DEPARTMENT_OTHER): Payer: Medicare Other

## 2015-11-26 VITALS — BP 121/82 | HR 79 | Temp 98.9°F | Resp 18

## 2015-11-26 DIAGNOSIS — C251 Malignant neoplasm of body of pancreas: Secondary | ICD-10-CM

## 2015-11-26 DIAGNOSIS — C259 Malignant neoplasm of pancreas, unspecified: Secondary | ICD-10-CM

## 2015-11-26 DIAGNOSIS — Z5111 Encounter for antineoplastic chemotherapy: Secondary | ICD-10-CM

## 2015-11-26 LAB — COMPREHENSIVE METABOLIC PANEL
ALT: 18 U/L (ref 0–55)
ANION GAP: 11 meq/L (ref 3–11)
AST: 22 U/L (ref 5–34)
Albumin: 3.4 g/dL — ABNORMAL LOW (ref 3.5–5.0)
Alkaline Phosphatase: 84 U/L (ref 40–150)
BILIRUBIN TOTAL: 0.39 mg/dL (ref 0.20–1.20)
BUN: 18.8 mg/dL (ref 7.0–26.0)
CO2: 27 meq/L (ref 22–29)
CREATININE: 1 mg/dL (ref 0.7–1.3)
Calcium: 9.5 mg/dL (ref 8.4–10.4)
Chloride: 103 mEq/L (ref 98–109)
EGFR: 80 mL/min/{1.73_m2} — ABNORMAL LOW (ref >90)
GLUCOSE: 138 mg/dL (ref 70–140)
Potassium: 3.7 mEq/L (ref 3.5–5.1)
Sodium: 141 mEq/L (ref 136–145)
TOTAL PROTEIN: 7.3 g/dL (ref 6.4–8.3)

## 2015-11-26 LAB — CBC WITH DIFFERENTIAL/PLATELET
BASO%: 0.6 % (ref 0.0–2.0)
Basophils Absolute: 0 10*3/uL (ref 0.0–0.1)
EOS ABS: 0.2 10*3/uL (ref 0.0–0.5)
EOS%: 4.4 % (ref 0.0–7.0)
HCT: 34.6 % — ABNORMAL LOW (ref 38.4–49.9)
HGB: 11.8 g/dL — ABNORMAL LOW (ref 13.0–17.1)
LYMPH%: 25 % (ref 14.0–49.0)
MCH: 27.4 pg (ref 27.2–33.4)
MCHC: 34.1 g/dL (ref 32.0–36.0)
MCV: 80.3 fL (ref 79.3–98.0)
MONO#: 0.4 10*3/uL (ref 0.1–0.9)
MONO%: 12.2 % (ref 0.0–14.0)
NEUT%: 57.8 % (ref 39.0–75.0)
NEUTROS ABS: 2 10*3/uL (ref 1.5–6.5)
NRBC: 0 % (ref 0–0)
PLATELETS: 159 10*3/uL (ref 140–400)
RBC: 4.31 10*6/uL (ref 4.20–5.82)
RDW: 15.4 % — AB (ref 11.0–14.6)
WBC: 3.4 10*3/uL — AB (ref 4.0–10.3)
lymph#: 0.9 10*3/uL (ref 0.9–3.3)

## 2015-11-26 MED ORDER — PACLITAXEL PROTEIN-BOUND CHEMO INJECTION 100 MG
120.0000 mg/m2 | Freq: Once | INTRAVENOUS | Status: AC
  Administered 2015-11-26: 200 mg via INTRAVENOUS
  Filled 2015-11-26: qty 40

## 2015-11-26 MED ORDER — SODIUM CHLORIDE 0.9 % IV SOLN
Freq: Once | INTRAVENOUS | Status: AC
  Administered 2015-11-26: 14:00:00 via INTRAVENOUS

## 2015-11-26 MED ORDER — SODIUM CHLORIDE 0.9 % IV SOLN
1000.0000 mg/m2 | Freq: Once | INTRAVENOUS | Status: AC
  Administered 2015-11-26: 1748 mg via INTRAVENOUS
  Filled 2015-11-26: qty 45.97

## 2015-11-26 MED ORDER — SODIUM CHLORIDE 0.9% FLUSH
10.0000 mL | INTRAVENOUS | Status: DC | PRN
  Administered 2015-11-26: 10 mL
  Filled 2015-11-26: qty 10

## 2015-11-26 MED ORDER — PROCHLORPERAZINE MALEATE 10 MG PO TABS
10.0000 mg | ORAL_TABLET | Freq: Once | ORAL | Status: AC
  Administered 2015-11-26: 10 mg via ORAL

## 2015-11-26 MED ORDER — PROCHLORPERAZINE MALEATE 10 MG PO TABS
ORAL_TABLET | ORAL | Status: AC
  Filled 2015-11-26: qty 1

## 2015-11-26 MED ORDER — HEPARIN SOD (PORK) LOCK FLUSH 100 UNIT/ML IV SOLN
500.0000 [IU] | Freq: Once | INTRAVENOUS | Status: AC | PRN
  Administered 2015-11-26: 500 [IU]
  Filled 2015-11-26: qty 5

## 2015-11-26 NOTE — Progress Notes (Signed)
Nutrition follow-up completed with patient during infusion for pancreas cancer. Weight decreased and documented as 126.5 pounds on August 17 decreased from 133 pounds July 24. Patient reports he is having taste alterations. He tries to eat and is drinking one Ensure Plus a day.  Nutrition diagnosis: Food and nutrition related knowledge deficit continues.  Intervention: Educated patient to increase Ensure Plus 3 times a day between meals. Educated patient on strategies for improving taste alterations and provided fact sheet. Questions were answered.  Teach back method used.  Monitoring, evaluation, goals: Patient will tolerate increased calories and protein and increase Ensure Plus 3 times a day to minimize further weight loss.  Next visit: Friday, September 15, during infusion.  **Disclaimer: This note was dictated with voice recognition software. Similar sounding words can inadvertently be transcribed and this note may contain transcription errors which may not have been corrected upon publication of note.**

## 2015-11-26 NOTE — Patient Instructions (Signed)
[facility] Discharge Instructions for Patients Receiving Chemotherapy  Today you received the following chemotherapy agents Abraxane/Gemzar.  To help prevent nausea and vomiting after your treatment, we encourage you to take your nausea medication as directed.   If you develop nausea and vomiting that is not controlled by your nausea medication, call the clinic.   BELOW ARE SYMPTOMS THAT SHOULD BE REPORTED IMMEDIATELY:  *FEVER GREATER THAN 100.5 F  *CHILLS WITH OR WITHOUT FEVER  NAUSEA AND VOMITING THAT IS NOT CONTROLLED WITH YOUR NAUSEA MEDICATION  *UNUSUAL SHORTNESS OF BREATH  *UNUSUAL BRUISING OR BLEEDING  TENDERNESS IN MOUTH AND THROAT WITH OR WITHOUT PRESENCE OF ULCERS  *URINARY PROBLEMS  *BOWEL PROBLEMS  UNUSUAL RASH Items with * indicate a potential emergency and should be followed up as soon as possible.  Feel free to call the clinic you have any questions or concerns. The clinic phone number is [phone].  Please show the CHEMO ALERT CARD at check-in to the Emergency Department and triage nurse.    

## 2015-11-27 LAB — CANCER ANTIGEN 19-9: CAN 19-9: 84 U/mL — AB (ref 0–35)

## 2015-12-01 ENCOUNTER — Ambulatory Visit (HOSPITAL_BASED_OUTPATIENT_CLINIC_OR_DEPARTMENT_OTHER): Payer: Medicare Other | Admitting: Hematology

## 2015-12-01 ENCOUNTER — Telehealth: Payer: Self-pay | Admitting: *Deleted

## 2015-12-01 ENCOUNTER — Other Ambulatory Visit: Payer: Self-pay | Admitting: *Deleted

## 2015-12-01 ENCOUNTER — Ambulatory Visit (HOSPITAL_COMMUNITY)
Admission: RE | Admit: 2015-12-01 | Discharge: 2015-12-01 | Disposition: A | Discharge status: Home / Self Care | Payer: Medicare Other | Source: Ambulatory Visit | Attending: Hematology | Admitting: Hematology

## 2015-12-01 ENCOUNTER — Encounter: Payer: Self-pay | Admitting: Hematology

## 2015-12-01 VITALS — BP 133/55 | HR 92 | Temp 98.0°F | Resp 17 | Ht 70.0 in | Wt 127.0 lb

## 2015-12-01 DIAGNOSIS — C251 Malignant neoplasm of body of pancreas: Secondary | ICD-10-CM | POA: Diagnosis not present

## 2015-12-01 DIAGNOSIS — M7989 Other specified soft tissue disorders: Secondary | ICD-10-CM | POA: Diagnosis not present

## 2015-12-01 DIAGNOSIS — G893 Neoplasm related pain (acute) (chronic): Secondary | ICD-10-CM

## 2015-12-01 DIAGNOSIS — R600 Localized edema: Secondary | ICD-10-CM | POA: Insufficient documentation

## 2015-12-01 DIAGNOSIS — C259 Malignant neoplasm of pancreas, unspecified: Secondary | ICD-10-CM

## 2015-12-01 DIAGNOSIS — C61 Malignant neoplasm of prostate: Secondary | ICD-10-CM

## 2015-12-01 NOTE — Telephone Encounter (Signed)
Oncology Nurse Navigator Documentation  Oncology Nurse Navigator Flowsheets 12/01/2015  Navigator Location [facility]  Navigator Encounter Type Telephone  Telephone Incoming Call;Symptom Mgt--persistent swelling in his left ankle over few days despite elevation and wearing compression sock. Also reports pain in his calf, increases with ambulation.  Abnormal Finding Date -  Confirmed Diagnosis Date -  Treatment Initiated Date -  Patient Visit Type -  Treatment Phase -  Barriers/Navigation Needs Coordination of Care  Education -Informed him he is high risk for DVT and may require a venous doppler today.  Interventions Other--will make MD aware  Referrals -  Coordination of Care -  Education Method -  Support Groups/Services -  Acuity -  Time Spent with Patient 15

## 2015-12-01 NOTE — Progress Notes (Signed)
VASCULAR LAB PRELIMINARY  PRELIMINARY  PRELIMINARY  PRELIMINARY  Bilateral lower extremity venous duplex completed.    Preliminary report:  Bilateral:  No evidence of DVT, superficial thrombosis, or Baker's Cyst. Mild interstitial fluid noted in the disal right leg and mild to moderate in the left.   Audric Venn, RVS 12/01/2015, 2:59 PM

## 2015-12-01 NOTE — Telephone Encounter (Signed)
Spoke with pt and gave pt appt for Doppler study today at 2 pm at [facility].  Instructed pt to come back to the cancer center for appt with Dr. Burr Medico after doppler study.  Pt voiced understanding.

## 2015-12-01 NOTE — Progress Notes (Signed)
[place]  [phone] [phone]  Clinic follow up Note   Patient Care Team: Binnie Rail, MD as PCP - General (Internal Medicine) [name], MD as Consulting Physician (General Surgery) 12/01/2015  CHIEF COMPLAINTS:  Leg swelling and pain   Oncology History   Primary pancreatic cancer [facility])   Staging form: Pancreas, AJCC 7th Edition     Clinical stage from 09/30/2015: Stage IIB (T2, N1, M0) - Signed by Truitt Merle, MD on 10/07/2015       Primary pancreatic cancer ([place])   09/30/2015 Initial Diagnosis    Primary pancreatic cancer ([place])      09/30/2015 Initial Biopsy     endoscopic pancreatic body mass fine-needle aspiration show malignant cells consistent with adenocarcinoma.      09/30/2015 Procedure     EUS showe a 2.5 cm pancreatic mass causing a stream the dictation of the main pancreatic duct, and directly abutting the SMA an      10/06/2015 Imaging     CT chest, abdomen and  pelvis showed a  4 cm mass in the body of  pancreas, tumor abut the anterior wall of the SMA, a 1.4cm  he pedal duoligament lymph nodes is suspicious, 86mm RUL lung nodule is nonspecific, no other distant mets      10/15/2015 -  Chemotherapy    neoadjuvant chemo gemcitabine 1000mg /m2 and abraxane 120mg /m2, on day 1, 8 every 21 days         HISTORY OF PRESENTING ILLNESS:  [name] 80 y.o. male is here because of His recently diagnosed pancreatic cancer. He presents to the clinic with his wife today.  He has been having abdominal pain for the past one month, worst in the past few weeks, radiates to back, 3-5/10, intermittent, worse with empty stomack, no nausea, bloating, BM is slightly constipated, once a daily. No melana or hematochezia. He has good appetie and eats well, mild fatigue, he is aboe to do his daily activities without difficulty, such as morning his long. He also exercise regularly, bikes a few miles a day. No significant weight loss recently.  He  was seen by his primary care physician, and a CT chest and abdomen was done on 09/20/2015 which showed a 2.7 x 2.5 cm mass in the pancreatic body with associated pancreatic duct dilatation. He was referred to see GI Dr. Ardis Hughs, and underwent EGD and EUS, backward a mass fine-needle biopsy, which showed adenocarcinoma.  He was diagnosed with prosttae can 5 years ago, has been monitored, no treatment, he has low PSA level.  He is diabetic, he is to be on metformin, has been off for a while because his sugar has been relatively controlled with diet alone. His recent HbA1c was some 7.9 in 05/2015.   CURRENT THERAPY: neoadjuvant chemo gemcitabine 1000mg /m2 and abraxane 120mg /m2, on day 1, 8 and 15 every 28 days   INTERIM HISTORY: [name] came in for an urgent visit for his leg pain and swelling. He previously had bilateral ankle fusion, and has chronic mild ankle edema. In the past week, his ankle edema has got much worse, and extended to the mid calf, he also noticed pain in the left lower leg when he walks. He called this morning, and we scheduled him for a bilateral lower extremity Doppler, which was negative for DVT.  He has been tolerating chemotherapy well overall, no significant nausea, or other side effects. He has moderate fatigue and slightly decreased appetite, he has lost  a few pounds since he started chemotherapy. He has been drinking ensure, but he complains of watery bowel movement after drinking ensure, one to twice a day.   MEDICAL HISTORY:  Past Medical History:  Diagnosis Date  . Arthritis    R hip and shoulder  . Complication of anesthesia    Local anesthetic used in office for Prostate biopsy" reaction Tachycardia,nausea, hallucinations, Blood pressure elevated"- No problems once done at hospital with anesthesia" Thinks Ciprofloxacin was the injection"   . Diabetes mellitus   . Enlarged prostate   . Femoral bruit    R femoral  . GERD with stricture    PMH of  . Hyperlipidemia     . Hypertension   . Macular degeneration    glaucoma suspect  . PONV (postoperative nausea and vomiting)   . Prostate cancer [facility])    prostate ; Dr Wyvonnia Lora to be monitored x 5 yrs now.  . Squamous papilloma    of esophagus    SURGICAL HISTORY: Past Surgical History:  Procedure Laterality Date  . ANKLE FUSION  2004  . COLONOSCOPY  05/2012  . esophageal dilation  2002   Dr  Lucio Edward  . ESOPHAGOGASTRODUODENOSCOPY (EGD) WITH PROPOFOL N/A 09/30/2015   Procedure: ESOPHAGOGASTRODUODENOSCOPY (EGD) WITH PROPOFOL;  Surgeon: Milus Banister, MD;  Location: [facility];  Service: Endoscopy;  Laterality: N/A;  . EUS N/A 09/30/2015   Procedure: UPPER ENDOSCOPIC ULTRASOUND (EUS) RADIAL;  Surgeon: Milus Banister, MD;  Location: [facility];  Service: Endoscopy;  Laterality: N/A;  . FINE NEEDLE ASPIRATION N/A 09/30/2015   Procedure: FINE NEEDLE ASPIRATION (FNA) LINEAR;  Surgeon: Milus Banister, MD;  Location: [facility];  Service: Endoscopy;  Laterality: N/A;  . HERNIA REPAIR     bil inguinal hernias  . PARTIAL COLECTOMY     perforated diverticulitis  . PORTACATH PLACEMENT Left 10/13/2015   Procedure: INSERTION PORT-A-CATH;  Surgeon: [name], MD;  Location: [facility];  Service: General;  Laterality: Left;  . PROSTATE BIOPSY  03/29/2012   Procedure: BIOPSY TRANSRECTAL ULTRASONIC PROSTATE (TUBP);  Surgeon: Fredricka Bonine, MD;  Location: [facility];  Service: Urology;  Laterality: N/A;  . ROTATOR CUFF REPAIR  2006  . TOTAL HIP ARTHROPLASTY Left 2012    SOCIAL HISTORY: Social History   Social History  . Marital status: Married    Spouse name: [name]  . Number of children: 1  . Years of education: N/A   Occupational History  . Retired     Ecologist in [place]  . Smoking status: Former Smoker    Packs/day: 0.50    Years: 40.00    Types: Cigarettes    Quit date: 03/27/1989  . Smokeless tobacco: Never Used     Comment:  smoked [place], up to 1 ppd  . Alcohol use No  . Drug use: No  . Sexual activity: Not on file   Other Topics Concern  . 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 He and his wife moved to [place] to be closer to his son.  He has one son and one Granddaughter who is 89.  FAMILY HISTORY: Family History  Problem Relation Age of Onset  . Kidney disease Father     ? Rheumatic fever as child  . Hypertension Father   . Heart attack Paternal Uncle     in 26s  . COPD Paternal Uncle     due to exposures in [facility]  . Kidney disease Paternal Aunt     ? Rheumatic fever as child  . Cancer Neg Hx   . Diabetes Neg Hx   . Stroke Neg Hx   . Colon cancer Neg Hx     ALLERGIES:  is allergic to ace inhibitors; angiotensin receptor blockers; and ciprofloxacin.  MEDICATIONS:  Current Outpatient Prescriptions  Medication Sig Dispense Refill  . amLODipine (NORVASC) 10 MG tablet Take 1 tablet (10 mg total) by mouth daily. 90 tablet 3  . aspirin 81 MG tablet Take 1 tablet (81 mg total) by mouth daily. 30 tablet   . doxazosin (CARDURA) 1 MG tablet TAKE 1 TABLET BY MOUTH EVERY DAY 90 tablet 2  . glucose blood (ONETOUCH VERIO) test strip Check blood sugar once daily 100 each 12  . hydrochlorothiazide (MICROZIDE) 12.5 MG capsule Take 1 capsule (12.5 mg total) by mouth daily. 90 capsule 3  . latanoprost (XALATAN) 0.005 % ophthalmic solution Place 1 drop into both eyes at bedtime.  1  . lidocaine-prilocaine (EMLA) cream Apply 1 application topically as needed. Apply to pac site 1 hour prior to stick and cover with plastic wrap 30 g 11  . metFORMIN (GLUCOPHAGE) 500 MG tablet Take 1 tablet (500 mg total) by mouth 2 (two) times daily with a meal.  180 tablet 3  . multivitamin-lutein (OCUVITE-LUTEIN) CAPS Take 1 capsule by mouth daily.    . naproxen sodium (ANAPROX) 220 MG tablet Take 220 mg by mouth 2 (two) times daily with a meal.    . ondansetron (ZOFRAN) 8 MG tablet Take 1 tablet (8 mg total) by mouth 2 (two) times daily as needed (Nausea or vomiting). (Patient not taking: Reported on 10/11/2015) 30 tablet 1  . ONETOUCH DELICA LANCETS 99991111 MISC Use to check blood sugars once a day Dx E11.9 100 each 3  . ONETOUCH VERIO test strip CHECK BLOOD SUGAR ONCE DAILY. DX250.00 100 each 5  . oxyCODONE (OXY IR/ROXICODONE) 5 MG immediate release tablet Take 1-2 tablets (5-10 mg total) by mouth every 6 (six) hours as needed for moderate pain, severe pain or breakthrough pain. 30 tablet 0  . pravastatin (PRAVACHOL) 20 MG tablet Take 1 tablet (20 mg total) by mouth at bedtime. 90 tablet 3  . prochlorperazine (COMPAZINE) 10 MG tablet Take 1 tablet (10 mg total) by mouth every 6 (six) hours as needed (Nausea or vomiting). (Patient not taking: Reported on 10/11/2015) 30 tablet 1   No current facility-administered medications for this visit.     REVIEW OF SYSTEMS:   Constitutional: Denies fevers, chills or abnormal night sweats Eyes: Denies blurriness of vision, double vision or watery eyes Ears, nose, mouth, throat, and face: Denies mucositis or sore throat Respiratory: Denies cough, dyspnea or wheezes Cardiovascular: Denies palpitation, chest discomfort or lower extremity swelling Gastrointestinal:  Denies nausea, heartburn or change in bowel habits, (+) epigastric pain Skin: Denies abnormal skin rashes Lymphatics: Denies new lymphadenopathy or easy bruising Neurological:Denies numbness, tingling or new weaknesses Behavioral/Psych: Mood is stable, no new changes  All other systems were reviewed with the patient and are negative.  PHYSICAL EXAMINATION: ECOG PERFORMANCE STATUS: 1 - Symptomatic but completely ambulatory  Vitals:  12/01/15 1513    BP: (!) 133/55  Pulse: 92  Resp: 17  Temp: 98 F (36.7 C)   Filed Weights   12/01/15 1513  Weight: 127 lb (57.6 kg)    GENERAL:alert, no distress and comfortable SKIN: skin color, texture, turgor are normal, no rashes or significant lesions EYES: normal, conjunctiva are pink and non-injected, sclera clear OROPHARYNX:no exudate, no erythema and lips, buccal mucosa, and tongue normal  NECK: supple, thyroid normal size, non-tender, without nodularity LYMPH:  no palpable lymphadenopathy in the cervical, axillary or inguinal LUNGS: clear to auscultation and percussion with normal breathing effort HEART: regular rate & rhythm and no murmurs and no lower extremity edema ABDOMEN:abdomen soft, non-tender and normal bowel sounds Musculoskeletal:no cyanosis of digits and no clubbing  PSYCH: alert & oriented x 3 with fluent speech NEURO: no focal motor/sensory deficits EXT: (+) Pitting edema on bilateral ankle and lower extremity, L>R, no significant calf tenderness, mild tenderness on the left lower extremity above the ankle  LABORATORY DATA:  I have reviewed the data as listed CBC Latest Ref Rng & Units 11/26/2015 11/19/2015 11/11/2015  WBC 4.0 - 10.3 10e3/uL 3.4(L) 4.5 7.9  Hemoglobin 13.0 - 17.1 g/dL 11.8(L) 12.2(L) 12.0(L)  Hematocrit 38.4 - 49.9 % 34.6(L) 35.9(L) 36.6(L)  Platelets 140 - 400 10e3/uL 159 351 259   CMP Latest Ref Rng & Units 11/26/2015 11/19/2015 11/11/2015  Glucose 70 - 140 mg/dl 138 106 134  BUN 7.0 - 26.0 mg/dL 18.8 18.1 17.1  Creatinine 0.7 - 1.3 mg/dL 1.0 1.1 1.1  Sodium 136 - 145 mEq/L 141 142 142  Potassium 3.5 - 5.1 mEq/L 3.7 3.6 3.3(L)  Chloride 96 - 112 mEq/L - - -  CO2 22 - 29 mEq/L 27 28 29   Calcium 8.4 - 10.4 mg/dL 9.5 10.0 9.3  Total Protein 6.4 - 8.3 g/dL 7.3 7.4 6.8  Total Bilirubin 0.20 - 1.20 mg/dL 0.39 0.37 0.35  Alkaline Phos 40 - 150 U/L 84 81 72  AST 5 - 34 U/L 22 25 20   ALT 0 - 55 U/L 18 22 16    CA19.9 (0-35U/ml) 09/30/2015: 370 10/07/2015:  516 10/29/2015: 381   PATHOLOGY REPORT Diagnosis 09/30/2015 FINE NEEDLE ASPIRATION, ENDOSCOPIC PANCREATIC BODY (SPECIMEN 1 OF 1 COLLECTED 09/30/2015) MALIGNANT CELLS PRESENT, CONSISTENT WITH ADENOCARCINOMA. SEE COMMENT. COMMENT: THE CASE WAS DISCUSSED WITH DR. Ardis Hughs ON 10/01/2015.  RADIOGRAPHIC STUDIES: I have personally reviewed the radiological images as listed and agreed with the findings in the report. No results found. Upper EUS 09/30/2015 Dr. Ardis Hughs  Endosonographic Finding 1. An irregular mass was identified in the pancreatic body. The mass was hypoechoic. The mass measured 25 mm in maximal cross-sectional diameter. The endosonographic borders were poorlydefined. The mass directly abuts the SMA for about 1.5cm and also abuts the Celiac trunk for a short distance. The remainder of the pancreatic parenchyma was normal. Upstream from the mass, the main pancreatic duct was dilated, a tortuous/ectatic duct and a maximum duct diameter of 5 mm. Fine needle aspiration for cytology was performed. Color Doppler imaging was utilized prior to needle puncture to confirm a lack of significant vascular structures within the needle path. Three passes were made with the 25 gauge needle using a transgastric approach. Some passes were made with a stylet. A cytotechnologist was present to evaluate the adequacy of the specimen. 2. No peripancreatic adenopathy 3. CBD was normal, non-dilated 4. Gallbladder was normal 5. Limited views of liver, spleen, portal and splenic vessels were all normal.  - Schatzki's  ring (vs focal peptic stricture) at the GE junction - 2.5cm mass in the body of pancreas causing upstream dilation of the main pancreatic duct, directly abutting the SMA and celiac trunk. Preliminary cytology review from the mass was positive for malignancy (likely adenocarcinoma)   Bilateral lower extremity venous duplex 12/01/2015  Preliminary report:  Bilateral:  No evidence of DVT, superficial  thrombosis, or Baker's Cyst. Mild interstitial fluid noted in the disal right leg and mild to moderate in the left.   ASSESSMENT & PLAN: 80 year old African-American male, presented with epigastric pain for a month.  1. B/l LE edema and pain -Doppler today was negative for DVT or superficial thrombosis -This is probably related to his prior ankle fusion, hypoalbuminemia  -I recommend him to activity slides when he sits or sleeps -I encouraged him to try compression stock, he has at home -He is on hydrochlorothiazide, okay to continue for leg swelling -We discussed his at high risk for thrombosis  2. Primary pancreatic cancer, adenocarcinoma in the body of pancreas,cT2N1M0, stage IIB  -I reviewed his CT scan findings, EUS findings and pancreatic mass biopsy pathology results with pt and his wife in details. -His CT scan showed no evidence of distant metastasis, there is a 14 mm hepatoduodenal node on CT which is suspicious for nodal metastasis, EUS was negative for peripancreatic adenopathy.  -CT abdomen with contrast (pancreatic protocol) showed the pancreatic mass abuts the SMA. His images were reviewed in our tumor board yesterday, Dr. Barry Dienes feels his pancreas cancer is borderline resectable, and recommend neoadjuvant chemo. -We discussed the role of chemotherapy before and after surgery. We reviewed the data of different chemotherapy regimens in pancreatic cancer, including single agent gemcitabine, gemcitabine and Abraxane, and FOLFIRINOX. Given his advanced age, but preserved performance status and limited medical comorbidities, I recommend him to have neoadjuvant chemotherapy with gemcitabine and Abraxane weekly, 3 weeks on, one-week off.  -He tolerated the first two cycles chemo very well, his abdominal pain has resolved, CA19.9 has decreased, he is likely responding to chemo.  -He is scheduled to have a restaging CT scan next week  2. DM -He is not on medication, however his recent  HbA1c was 7.9 in 05/2015 -I recommend him to see his primary care physician Dr. Quay Burow, and consider restart metformin which he was on before -We discussed the impact of chemotherapy, especially premedication dexamethasone, on his blood glucose. We will try to use minimal or no steroids before chemotherapy duties diabetes  3. Abdominal pain -secondary to #1 -resolved after he started chemo   4. Prostate cancer, untreated  -His prostate cancer has been monitored by his urologist, treatment was not recommended -will follow up clinically   5. HTN -He is on amlodipine and hydrocortisone reside, he states his blood pressure has been much lower than before -We discussed the impact of chemotherapy on his blood pressure, and I recommend him to hold hydrochlorothiazide after chemotherapy, especially when he has diarrhea  Plan -I'll see him on September 15 after his restaging CT scan  All questions were answered. The patient knows to call the clinic with any problems, questions or concerns. I spent 15 minutes counseling the patient face to face. The total time spent in the appointment was 20 minutes and more than 50% was on counseling.     Truitt Merle, MD 12/01/2015 3:34 PM

## 2015-12-02 ENCOUNTER — Ambulatory Visit: Payer: Medicare Other | Admitting: Hematology

## 2015-12-07 ENCOUNTER — Ambulatory Visit (HOSPITAL_COMMUNITY)
Admission: RE | Admit: 2015-12-07 | Discharge: 2015-12-07 | Disposition: A | Discharge status: Home / Self Care | Payer: Medicare Other | Source: Ambulatory Visit | Attending: Hematology | Admitting: Hematology

## 2015-12-07 ENCOUNTER — Encounter (HOSPITAL_COMMUNITY): Payer: Self-pay

## 2015-12-07 DIAGNOSIS — C259 Malignant neoplasm of pancreas, unspecified: Secondary | ICD-10-CM | POA: Diagnosis not present

## 2015-12-07 DIAGNOSIS — I700 Atherosclerosis of aorta: Secondary | ICD-10-CM | POA: Insufficient documentation

## 2015-12-07 DIAGNOSIS — N400 Enlarged prostate without lower urinary tract symptoms: Secondary | ICD-10-CM | POA: Insufficient documentation

## 2015-12-07 DIAGNOSIS — J432 Centrilobular emphysema: Secondary | ICD-10-CM | POA: Diagnosis not present

## 2015-12-07 DIAGNOSIS — R590 Localized enlarged lymph nodes: Secondary | ICD-10-CM | POA: Diagnosis not present

## 2015-12-07 DIAGNOSIS — K5730 Diverticulosis of large intestine without perforation or abscess without bleeding: Secondary | ICD-10-CM | POA: Insufficient documentation

## 2015-12-07 DIAGNOSIS — K8689 Other specified diseases of pancreas: Secondary | ICD-10-CM | POA: Diagnosis not present

## 2015-12-07 MED ORDER — IOPAMIDOL (ISOVUE-300) INJECTION 61%
100.0000 mL | Freq: Once | INTRAVENOUS | Status: AC | PRN
  Administered 2015-12-07: 100 mL via INTRAVENOUS

## 2015-12-08 ENCOUNTER — Telehealth: Payer: Self-pay | Admitting: *Deleted

## 2015-12-08 ENCOUNTER — Telehealth: Payer: Self-pay | Admitting: Hematology

## 2015-12-08 NOTE — Telephone Encounter (Signed)
Oncology Nurse Navigator Documentation  Oncology Nurse Navigator Flowsheets 12/08/2015  Navigator Location [facility]  Navigator Encounter Type Telephone  Telephone Outgoing Call;Appt Confirmation/Clarification  Abnormal Finding Date -  Confirmed Diagnosis Date -  Treatment Initiated Date -  Patient Visit Type -  Treatment Phase -  Barriers/Navigation Needs Coordination of Care  Education -  Interventions Coordination of Care--discussion of case in tumor board and may benefit from SBRT, so will have Dr. Lisbeth Renshaw see him  Referrals -  Coordination of Care Appts--scheduled to see Dr. Lisbeth Renshaw on 9/15 at 0800 and Dr. Barry Dienes at [place] with Patient 15  Provided wife with the appointments for 9/15 at 0800 for Dr. Lisbeth Renshaw and Dr. Barry Dienes will see him at 0945. Apologized for the fragmentation in the times, but this was best we have due to late addition. They agree to the appointments for Friday and understand Dr. Burr Medico may not see him until the afternoon and they will decide upon having chemo or not that day. Notified Rhonda in rad onc to add appointment to [facility].

## 2015-12-08 NOTE — Telephone Encounter (Signed)
His case was discussed in our tumor board this morning, restaging CT scan from yesterday showed good response to neoadjuvant chemotherapy, but still borderline resectable. We'll refer him to radiation oncology Dr. Lisbeth Renshaw, who will see him this Friday. I called patient and informed his wife. I'll also see him on Friday morning.  [name]  12/08/2015

## 2015-12-10 ENCOUNTER — Ambulatory Visit (HOSPITAL_BASED_OUTPATIENT_CLINIC_OR_DEPARTMENT_OTHER): Payer: Medicare Other | Admitting: Hematology

## 2015-12-10 ENCOUNTER — Encounter: Payer: Self-pay | Admitting: Hematology

## 2015-12-10 ENCOUNTER — Ambulatory Visit (HOSPITAL_BASED_OUTPATIENT_CLINIC_OR_DEPARTMENT_OTHER): Payer: Medicare Other

## 2015-12-10 ENCOUNTER — Encounter: Payer: Medicare Other | Admitting: Nutrition

## 2015-12-10 ENCOUNTER — Ambulatory Visit
Admission: RE | Admit: 2015-12-10 | Discharge: 2015-12-10 | Disposition: A | Discharge status: Home / Self Care | Payer: Medicare Other | Source: Ambulatory Visit | Attending: Radiation Oncology | Admitting: Radiation Oncology

## 2015-12-10 ENCOUNTER — Ambulatory Visit: Payer: Medicare Other | Admitting: Nutrition

## 2015-12-10 ENCOUNTER — Other Ambulatory Visit (HOSPITAL_BASED_OUTPATIENT_CLINIC_OR_DEPARTMENT_OTHER): Payer: Medicare Other

## 2015-12-10 VITALS — BP 141/63 | HR 89 | Temp 97.7°F | Resp 20 | Ht 70.0 in | Wt 127.0 lb

## 2015-12-10 DIAGNOSIS — I10 Essential (primary) hypertension: Secondary | ICD-10-CM

## 2015-12-10 DIAGNOSIS — E119 Type 2 diabetes mellitus without complications: Secondary | ICD-10-CM

## 2015-12-10 DIAGNOSIS — C259 Malignant neoplasm of pancreas, unspecified: Secondary | ICD-10-CM

## 2015-12-10 DIAGNOSIS — C251 Malignant neoplasm of body of pancreas: Secondary | ICD-10-CM

## 2015-12-10 DIAGNOSIS — C61 Malignant neoplasm of prostate: Secondary | ICD-10-CM

## 2015-12-10 DIAGNOSIS — Z5111 Encounter for antineoplastic chemotherapy: Secondary | ICD-10-CM | POA: Diagnosis present

## 2015-12-10 LAB — COMPREHENSIVE METABOLIC PANEL
ALBUMIN: 3.4 g/dL — AB (ref 3.5–5.0)
ALK PHOS: 89 U/L (ref 40–150)
ALT: 18 U/L (ref 0–55)
ANION GAP: 13 meq/L — AB (ref 3–11)
AST: 21 U/L (ref 5–34)
BILIRUBIN TOTAL: 0.38 mg/dL (ref 0.20–1.20)
BUN: 21 mg/dL (ref 7.0–26.0)
CALCIUM: 9.8 mg/dL (ref 8.4–10.4)
CO2: 26 mEq/L (ref 22–29)
Chloride: 105 mEq/L (ref 98–109)
Creatinine: 1 mg/dL (ref 0.7–1.3)
EGFR: 81 mL/min/{1.73_m2} — AB (ref >90)
GLUCOSE: 90 mg/dL (ref 70–140)
Potassium: 3.7 mEq/L (ref 3.5–5.1)
Sodium: 144 mEq/L (ref 136–145)
TOTAL PROTEIN: 7.5 g/dL (ref 6.4–8.3)

## 2015-12-10 LAB — CBC WITH DIFFERENTIAL/PLATELET
BASO%: 0.4 % (ref 0.0–2.0)
BASOS ABS: 0.1 10*3/uL (ref 0.0–0.1)
EOS ABS: 0.5 10*3/uL (ref 0.0–0.5)
EOS%: 4.3 % (ref 0.0–7.0)
HEMATOCRIT: 36.6 % — AB (ref 38.4–49.9)
HEMOGLOBIN: 11.9 g/dL — AB (ref 13.0–17.1)
LYMPH%: 11.3 % — ABNORMAL LOW (ref 14.0–49.0)
MCH: 26.7 pg — ABNORMAL LOW (ref 27.2–33.4)
MCHC: 32.5 g/dL (ref 32.0–36.0)
MCV: 82.3 fL (ref 79.3–98.0)
MONO#: 1.5 10*3/uL — ABNORMAL HIGH (ref 0.1–0.9)
MONO%: 11.8 % (ref 0.0–14.0)
NEUT%: 72.2 % (ref 39.0–75.0)
NEUTROS ABS: 9 10*3/uL — AB (ref 1.5–6.5)
PLATELETS: 288 10*3/uL (ref 140–400)
RBC: 4.45 10*6/uL (ref 4.20–5.82)
RDW: 16.3 % — ABNORMAL HIGH (ref 11.0–14.6)
WBC: 12.5 10*3/uL — ABNORMAL HIGH (ref 4.0–10.3)
lymph#: 1.4 10*3/uL (ref 0.9–3.3)

## 2015-12-10 MED ORDER — SODIUM CHLORIDE 0.9 % IV SOLN
Freq: Once | INTRAVENOUS | Status: AC
  Administered 2015-12-10: 10:00:00 via INTRAVENOUS

## 2015-12-10 MED ORDER — HEPARIN SOD (PORK) LOCK FLUSH 100 UNIT/ML IV SOLN
500.0000 [IU] | Freq: Once | INTRAVENOUS | Status: AC | PRN
  Administered 2015-12-10: 500 [IU]
  Filled 2015-12-10: qty 5

## 2015-12-10 MED ORDER — PROCHLORPERAZINE MALEATE 10 MG PO TABS
10.0000 mg | ORAL_TABLET | Freq: Once | ORAL | Status: AC
  Administered 2015-12-10: 10 mg via ORAL

## 2015-12-10 MED ORDER — SODIUM CHLORIDE 0.9 % IV SOLN
1000.0000 mg/m2 | Freq: Once | INTRAVENOUS | Status: AC
  Administered 2015-12-10: 1748 mg via INTRAVENOUS
  Filled 2015-12-10: qty 45.97

## 2015-12-10 MED ORDER — PROCHLORPERAZINE MALEATE 10 MG PO TABS
ORAL_TABLET | ORAL | Status: AC
  Filled 2015-12-10: qty 1

## 2015-12-10 MED ORDER — PACLITAXEL PROTEIN-BOUND CHEMO INJECTION 100 MG
120.0000 mg/m2 | Freq: Once | INTRAVENOUS | Status: AC
  Administered 2015-12-10: 200 mg via INTRAVENOUS
  Filled 2015-12-10: qty 40

## 2015-12-10 MED ORDER — SODIUM CHLORIDE 0.9% FLUSH
10.0000 mL | INTRAVENOUS | Status: DC | PRN
  Administered 2015-12-10: 10 mL
  Filled 2015-12-10: qty 10

## 2015-12-10 NOTE — Progress Notes (Signed)
[place]  [phone] [phone]  Clinic follow up Note   Patient Care Team: Binnie Rail, MD as PCP - General (Internal Medicine) [name], MD as Consulting Physician (General Surgery) 12/10/2015  CHIEF COMPLAINTS:  Follow-up pancreatic cancer  Oncology History   Primary pancreatic cancer [facility])   Staging form: Pancreas, AJCC 7th Edition     Clinical stage from 09/30/2015: Stage IIB (T2, N1, M0) - Signed by Truitt Merle, MD on 10/07/2015       Primary pancreatic cancer ([place])   09/30/2015 Initial Diagnosis    Primary pancreatic cancer ([place])      09/30/2015 Initial Biopsy     endoscopic pancreatic body mass fine-needle aspiration show malignant cells consistent with adenocarcinoma.      09/30/2015 Procedure     EUS showe a 2.5 cm pancreatic mass causing a stream the dictation of the main pancreatic duct, and directly abutting the SMA an      10/06/2015 Imaging     CT chest, abdomen and  pelvis showed a  4 cm mass in the body of  pancreas, tumor abut the anterior wall of the SMA, a 1.4cm  he pedal duoligament lymph nodes is suspicious, 43mm RUL lung nodule is nonspecific, no other distant mets      10/15/2015 -  Chemotherapy    neoadjuvant chemo gemcitabine 1000mg /m2 and abraxane 120mg /m2, on day 1, 8 every 21 days         HISTORY OF PRESENTING ILLNESS:  [name] 80 y.o. male is here because of His recently diagnosed pancreatic cancer. He presents to the clinic with his wife today.  He has been having abdominal pain for the past one month, worst in the past few weeks, radiates to back, 3-5/10, intermittent, worse with empty stomack, no nausea, bloating, BM is slightly constipated, once a daily. No melana or hematochezia. He has good appetie and eats well, mild fatigue, he is aboe to do his daily activities without difficulty, such as morning his long. He also exercise regularly, bikes a few miles a day. No significant weight loss  recently.  He was seen by his primary care physician, and a CT chest and abdomen was done on 09/20/2015 which showed a 2.7 x 2.5 cm mass in the pancreatic body with associated pancreatic duct dilatation. He was referred to see GI Dr. Ardis Hughs, and underwent EGD and EUS, backward a mass fine-needle biopsy, which showed adenocarcinoma.  He was diagnosed with prosttae can 5 years ago, has been monitored, no treatment, he has low PSA level.  He is diabetic, he is to be on metformin, has been off for a while because his sugar has been relatively controlled with diet alone. His recent HbA1c was some 7.9 in 05/2015.   CURRENT THERAPY: neoadjuvant chemo gemcitabine 1000mg /m2 and abraxane 120mg /m2, on day 1, 8 and 15 every 28 days   INTERIM HISTORY: [name] came in for follow up. He is doing well, his epigastric discomfort has resolved lately, he feels "this is the best week I ever had", has very good appetite, and energy level, and functions well at home. He has been having mild discomfort in the left upper back below the scapula, no impact on his shoulder movement or his activity. He has no other complaints.  His case was discussed in our GI tumor Board, radiation was recommended, and he saw Dr. Lisbeth Renshaw this morning.   MEDICAL HISTORY:  Past Medical History:  Diagnosis Date  . Arthritis  R hip and shoulder  . Complication of anesthesia    Local anesthetic used in office for Prostate biopsy" reaction Tachycardia,nausea, hallucinations, Blood pressure elevated"- No problems once done at hospital with anesthesia" Thinks Ciprofloxacin was the injection"   . Diabetes mellitus   . Enlarged prostate   . Femoral bruit    R femoral  . GERD with stricture    PMH of  . Hyperlipidemia   . Hypertension   . Macular degeneration    glaucoma suspect  . PONV (postoperative nausea and vomiting)   . Prostate cancer [facility])    prostate ; Dr Wyvonnia Lora to be monitored x 5 yrs now.  . Squamous papilloma     of esophagus    SURGICAL HISTORY: Past Surgical History:  Procedure Laterality Date  . ANKLE FUSION  2004  . COLONOSCOPY  05/2012  . esophageal dilation  2002   Dr  Lucio Edward  . ESOPHAGOGASTRODUODENOSCOPY (EGD) WITH PROPOFOL N/A 09/30/2015   Procedure: ESOPHAGOGASTRODUODENOSCOPY (EGD) WITH PROPOFOL;  Surgeon: Milus Banister, MD;  Location: [facility];  Service: Endoscopy;  Laterality: N/A;  . EUS N/A 09/30/2015   Procedure: UPPER ENDOSCOPIC ULTRASOUND (EUS) RADIAL;  Surgeon: Milus Banister, MD;  Location: [facility];  Service: Endoscopy;  Laterality: N/A;  . FINE NEEDLE ASPIRATION N/A 09/30/2015   Procedure: FINE NEEDLE ASPIRATION (FNA) LINEAR;  Surgeon: Milus Banister, MD;  Location: [facility];  Service: Endoscopy;  Laterality: N/A;  . HERNIA REPAIR     bil inguinal hernias  . PARTIAL COLECTOMY     perforated diverticulitis  . PORTACATH PLACEMENT Left 10/13/2015   Procedure: INSERTION PORT-A-CATH;  Surgeon: [name], MD;  Location: [facility];  Service: General;  Laterality: Left;  . PROSTATE BIOPSY  03/29/2012   Procedure: BIOPSY TRANSRECTAL ULTRASONIC PROSTATE (TUBP);  Surgeon: Fredricka Bonine, MD;  Location[facility];  Service: Urology;  Laterality: N/A;  . ROTATOR CUFF REPAIR  2006  . TOTAL HIP ARTHROPLASTY Left 2012    SOCIAL HISTORY: Social History   Social History  . Marital status: Married    Spouse name: Deloris  . Number of children: 1  . Years of education: N/A   Occupational History  . Retired     Ecologist in [place]  . Smoking status: Former Smoker    Packs/day: 0.50    Years: 40.00    Types: Cigarettes    Quit date: 03/27/1989  . Smokeless tobacco: Never Used     Comment: smoked [place], up to 1 ppd  . Alcohol use No  . Drug use: No  . Sexual activity: Not on file   Other Topics Concern  . 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 He and his wife moved to [place] to be closer to his son.  He has one son and one Granddaughter who is 67.  FAMILY HISTORY: Family History  Problem Relation Age of Onset  . Kidney disease Father     ? Rheumatic fever as child  . Hypertension Father   . Heart attack Paternal Uncle     in 64s  . COPD Paternal  Uncle     due to exposures in [facility] 2  . Kidney disease Paternal Aunt     ? Rheumatic fever as child  . Cancer Neg Hx   . Diabetes Neg Hx   . Stroke Neg Hx   . Colon cancer Neg Hx     ALLERGIES:  is allergic to ace inhibitors; angiotensin receptor blockers; and ciprofloxacin.  MEDICATIONS:  Current Outpatient Prescriptions  Medication Sig Dispense Refill  . amLODipine (NORVASC) 10 MG tablet Take 1 tablet (10 mg total) by mouth daily. 90 tablet 3  . aspirin 81 MG tablet Take 1 tablet (81 mg total) by mouth daily. 30 tablet   . doxazosin (CARDURA) 1 MG tablet TAKE 1 TABLET BY MOUTH EVERY DAY 90 tablet 2  . glucose blood (ONETOUCH VERIO) test strip Check blood sugar once daily 100 each 12  . hydrochlorothiazide (MICROZIDE) 12.5 MG capsule Take 1 capsule (12.5 mg total) by mouth daily. 90 capsule 3  . latanoprost (XALATAN) 0.005 % ophthalmic solution Place 1 drop into both eyes at bedtime.  1  . lidocaine-prilocaine (EMLA) cream Apply 1 application topically as needed. Apply to pac site 1 hour prior to stick and cover with plastic wrap 30 g 11  . metFORMIN (GLUCOPHAGE) 500 MG tablet Take 1 tablet (500 mg total) by mouth 2 (two) times daily with a meal. 180 tablet 3  . multivitamin-lutein (OCUVITE-LUTEIN) CAPS Take 1 capsule by mouth daily.    . naproxen sodium (ANAPROX) 220 MG tablet Take 220 mg by mouth 2 (two) times daily with a meal.    . ondansetron (ZOFRAN) 8 MG tablet  Take 1 tablet (8 mg total) by mouth 2 (two) times daily as needed (Nausea or vomiting). (Patient not taking: Reported on 10/11/2015) 30 tablet 1  . ONETOUCH DELICA LANCETS 99991111 MISC Use to check blood sugars once a day Dx E11.9 100 each 3  . ONETOUCH VERIO test strip CHECK BLOOD SUGAR ONCE DAILY. DX250.00 100 each 5  . oxyCODONE (OXY IR/ROXICODONE) 5 MG immediate release tablet Take 1-2 tablets (5-10 mg total) by mouth every 6 (six) hours as needed for moderate pain, severe pain or breakthrough pain. 30 tablet 0  . pravastatin (PRAVACHOL) 20 MG tablet Take 1 tablet (20 mg total) by mouth at bedtime. 90 tablet 3  . prochlorperazine (COMPAZINE) 10 MG tablet Take 1 tablet (10 mg total) by mouth every 6 (six) hours as needed (Nausea or vomiting). (Patient not taking: Reported on 10/11/2015) 30 tablet 1   No current facility-administered medications for this visit.     REVIEW OF SYSTEMS:   Constitutional: Denies fevers, chills or abnormal night sweats Eyes: Denies blurriness of vision, double vision or watery eyes Ears, nose, mouth, throat, and face: Denies mucositis or sore throat Respiratory: Denies cough, dyspnea or wheezes Cardiovascular: Denies palpitation, chest discomfort or lower extremity swelling Gastrointestinal:  Denies nausea, heartburn or change in bowel habits, (+) epigastric pain Skin: Denies abnormal skin rashes Lymphatics: Denies new lymphadenopathy or easy bruising Neurological:Denies numbness, tingling or new weaknesses Behavioral/Psych: Mood is stable, no new changes  All other systems were reviewed with the patient and are negative.  PHYSICAL EXAMINATION: ECOG PERFORMANCE STATUS: 1 - Symptomatic but completely ambulatory  Vitals:   12/10/15 0804  BP: (!) 141/63  Pulse: 89  Resp: 20  Temp: 97.7 F (36.5 C)   Filed Weights   12/10/15 0804  Weight: 127 lb (57.6 kg)    GENERAL:alert, no distress and comfortable SKIN: skin color,  texture, turgor are normal, no rashes  or significant lesions EYES: normal, conjunctiva are pink and non-injected, sclera clear OROPHARYNX:no exudate, no erythema and lips, buccal mucosa, and tongue normal  NECK: supple, thyroid normal size, non-tender, without nodularity LYMPH:  no palpable lymphadenopathy in the cervical, axillary or inguinal LUNGS: clear to auscultation and percussion with normal breathing effort HEART: regular rate & rhythm and no murmurs and no lower extremity edema ABDOMEN:abdomen soft, non-tender and normal bowel sounds Musculoskeletal:no cyanosis of digits and no clubbing  PSYCH: alert & oriented x 3 with fluent speech NEURO: no focal motor/sensory deficits EXT: (+) mild pitting edema on bilateral ankle and lower extremity, L>R, no significant calf tenderness, mild tenderness on the left lower extremity above the ankle  LABORATORY DATA:  I have reviewed the data as listed CBC Latest Ref Rng & Units 11/26/2015 11/19/2015 11/11/2015  WBC 4.0 - 10.3 10e3/uL 3.4(L) 4.5 7.9  Hemoglobin 13.0 - 17.1 g/dL 11.8(L) 12.2(L) 12.0(L)  Hematocrit 38.4 - 49.9 % 34.6(L) 35.9(L) 36.6(L)  Platelets 140 - 400 10e3/uL 159 351 259   CMP Latest Ref Rng & Units 11/26/2015 11/19/2015 11/11/2015  Glucose 70 - 140 mg/dl 138 106 134  BUN 7.0 - 26.0 mg/dL 18.8 18.1 17.1  Creatinine 0.7 - 1.3 mg/dL 1.0 1.1 1.1  Sodium 136 - 145 mEq/L 141 142 142  Potassium 3.5 - 5.1 mEq/L 3.7 3.6 3.3(L)  Chloride 96 - 112 mEq/L - - -  CO2 22 - 29 mEq/L 27 28 29   Calcium 8.4 - 10.4 mg/dL 9.5 10.0 9.3  Total Protein 6.4 - 8.3 g/dL 7.3 7.4 6.8  Total Bilirubin 0.20 - 1.20 mg/dL 0.39 0.37 0.35  Alkaline Phos 40 - 150 U/L 84 81 72  AST 5 - 34 U/L 22 25 20   ALT 0 - 55 U/L 18 22 16    CA19.9 (0-35U/ml) 09/30/2015: 370 10/07/2015: 516 10/29/2015: 381 11/26/2015: 84  PATHOLOGY REPORT Diagnosis 09/30/2015 FINE NEEDLE ASPIRATION, ENDOSCOPIC PANCREATIC BODY (SPECIMEN 1 OF 1 COLLECTED 09/30/2015) MALIGNANT CELLS PRESENT, CONSISTENT WITH ADENOCARCINOMA. SEE  COMMENT. COMMENT: THE CASE WAS DISCUSSED WITH DR. Ardis Hughs ON 10/01/2015.  RADIOGRAPHIC STUDIES: I have personally reviewed the radiological images as listed and agreed with the findings in the report. Ct Abdomen Pelvis W Contrast  Addendum Date: 12/07/2015   ADDENDUM REPORT: 12/07/2015 16:36 ADDENDUM: Upon further review, the superior mesenteric vein is uninvolved by the mass, and is clearly widely patent excepting some collateral flow from multiple splenoportal collateral pathway is bypassing the narrowed splenic vein. Electronically Signed   By: Vinnie Langton M.D.   On: 12/07/2015 16:36   Result Date: 12/07/2015 CLINICAL DATA:  80 year old male with history of pancreatic cancer diagnosed 2 months ago. Chemotherapy currently in progress. History of colonic resection due to obstruction. EXAM: CT ABDOMEN AND PELVIS WITH CONTRAST TECHNIQUE: Multidetector CT imaging of the abdomen and pelvis was performed using the standard protocol following bolus administration of intravenous contrast. CONTRAST:  118mL ISOVUE-300 IOPAMIDOL (ISOVUE-300) INJECTION 61% COMPARISON:  CT the abdomen and pelvis 10/06/2015. FINDINGS: Lower chest: Mild scarring in the visualize lung bases. Mild centrilobular emphysema. Hepatobiliary: Several sub cm low-attenuation lesions are noted in the liver, similar to prior study 10/06/2015, and although these are too small to definitively characterize, these are favored to represent tiny cysts. No other new suspicious hepatic lesions are noted. No intra or extrahepatic biliary ductal dilatation. Gallbladder is normal in appearance. Pancreas: Previously noted hypovascular mass in the mid body of the pancreas appears slightly smaller than  the prior examination, currently measuring 2.2 x 2.7 cm (image 36 of series 2). This lesion is intimately associated with multiple vascular structures, including the splenic artery and vein (adjacent to the splenoportal confluence), the anterior surface of the  superior mesenteric artery (makes contact with this over approximately 30% of its circumference), and the anterior pancreaticoduodenal artery. Additionally, these superior mesenteric vein appears occluded by this lesion (multiple collateral veins appear bypass cystic lesion in the upper abdomen). This is again associated with ductal dilatation throughout the distal body and tail of the pancreas, similar to the prior examination. Head, uncinate process and proximal body of the pancreas are normal in appearance. No peripancreatic inflammatory changes. Spleen: Unremarkable. Adrenals/Urinary Tract: Again noted are multiple well-defined low-attenuation lesions in the kidneys bilaterally, compatible with simple cysts, largest of which measures 2.7 cm in the upper pole of the left kidney. No hydroureteronephrosis. Urinary bladder is partially obscured by beam hardening artifact from the patient's left hip arthroplasty, but is generally unremarkable in appearance. Stomach/Bowel: The appearance of the stomach is normal. There is no pathologic dilatation of small bowel or colon. Numerous colonic diverticulae are noted, without surrounding inflammatory changes to suggest an acute diverticulitis at this time. Status post appendectomy. Vascular/Lymphatic: Aortic atherosclerosis, without evidence of aneurysm or dissection in the abdominal or pelvic vasculature. No lymphadenopathy noted in the abdomen or pelvis. Specifically, previously noted enlarged hepatoduodenal ligament lymph node is no longer identified. Reproductive: Prostate gland appears enlarged and heterogeneous measuring up to 3.7 x 6.2 cm. Median lobe hypertrophy. Seminal vesicles are unremarkable in appearance. Other: No significant volume of ascites.  No pneumoperitoneum. Musculoskeletal: There are no aggressive appearing lytic or blastic lesions noted in the visualized portions of the skeleton. Status post left total hip arthroplasty. IMPRESSION: 1. Today's study  demonstrates a positive response to therapy as evidenced by regression of hypovascular mass in the body of the pancreas, and regression of the previously noted enlarged hepatoduodenal ligament lymph node. No new sites of metastatic disease are noted elsewhere in the abdomen or pelvis. 2. Aortic atherosclerosis. 3. Colonic diverticulosis without evidence of acute diverticulitis at this time. 4. Prostatomegaly with median lobe hypertrophy. 5. Mild centrilobular emphysema. Electronically Signed: By: Vinnie Langton M.D. On: 12/07/2015 12:59   Upper EUS 09/30/2015 Dr. Ardis Hughs  Endosonographic Finding 1. An irregular mass was identified in the pancreatic body. The mass was hypoechoic. The mass measured 25 mm in maximal cross-sectional diameter. The endosonographic borders were poorlydefined. The mass directly abuts the SMA for about 1.5cm and also abuts the Celiac trunk for a short distance. The remainder of the pancreatic parenchyma was normal. Upstream from the mass, the main pancreatic duct was dilated, a tortuous/ectatic duct and a maximum duct diameter of 5 mm. Fine needle aspiration for cytology was performed. Color Doppler imaging was utilized prior to needle puncture to confirm a lack of significant vascular structures within the needle path. Three passes were made with the 25 gauge needle using a transgastric approach. Some passes were made with a stylet. A cytotechnologist was present to evaluate the adequacy of the specimen. 2. No peripancreatic adenopathy 3. CBD was normal, non-dilated 4. Gallbladder was normal 5. Limited views of liver, spleen, portal and splenic vessels were all normal.  - Schatzki's ring (vs focal peptic stricture) at the GE junction - 2.5cm mass in the body of pancreas causing upstream dilation of the main pancreatic duct, directly abutting the SMA and celiac trunk. Preliminary cytology review from the mass was positive for  malignancy (likely  adenocarcinoma)   Bilateral lower extremity venous duplex 12/01/2015  Preliminary report:  Bilateral:  No evidence of DVT, superficial thrombosis, or Baker's Cyst. Mild interstitial fluid noted in the disal right leg and mild to moderate in the left.  CT abdomen and pelvis w contrast 12/07/2015 IMPRESSION: 1. Today's study demonstrates a positive response to therapy as evidenced by regression of hypovascular mass in the body of the pancreas, and regression of the previously noted enlarged hepatoduodenal ligament lymph node. No new sites of metastatic disease are noted elsewhere in the abdomen or pelvis. 2. Aortic atherosclerosis. 3. Colonic diverticulosis without evidence of acute diverticulitis at this time. 4. Prostatomegaly with median lobe hypertrophy. 5. Mild centrilobular emphysema.  ADDENDUM: Upon further review, the superior mesenteric vein is uninvolved by the mass, and is clearly widely patent excepting some collateral flow from multiple splenoportal collateral pathway is bypassing the narrowed splenic vein.   ASSESSMENT & PLAN: 80 year old African-American male, presented with epigastric pain for a month.  1. Primary pancreatic cancer, adenocarcinoma in the body of pancreas,cT2N1M0, stage IIB, probable unresectable  -I reviewed his CT scan findings, EUS findings and pancreatic mass biopsy pathology results with pt and his wife in details. -His CT scan showed no evidence of distant metastasis, there is a 14 mm hepatoduodenal node on CT which is suspicious for nodal metastasis, EUS was negative for peripancreatic adenopathy.  -he has completed 2 cycles of Abraxane and gemcitabine, tolerated very well. -We reviewed his restaging CT scan etiology at tumor board a few days ago, he had a good partial response, but his tumor is still abuts some of the surrounding vessels, Dr. Barry Dienes feels it's not resectable for now  -SBRT was recommended, he will undergo 5 fractions, he will  need for fiducial placement at [facility] first -I have discussed with Dr. Lisbeth Renshaw. We'll proceed cycle 3 day 1 chemo today, while he is waiting for SBRT. I will hold chemo after today's treatment, and restart in 1-2 weeks after he complete his SBRT  -plan to restaging 1-2 months after his SBRT   2. B/l LE edema and pain -Doppler today was negative for DVT or superficial thrombosis -This is probably related to his prior ankle fusion, hypoalbuminemia  -improved   3. DM -He is not on medication, however his recent HbA1c was 7.9 in 05/2015 -I recommend him to see his primary care physician Dr. Quay Burow, and consider restart metformin which he was on before -We discussed the impact of chemotherapy, especially premedication dexamethasone, on his blood glucose. We will try to use minimal or no steroids before chemotherapy duties diabetes  4. Prostate cancer, untreated  -His prostate cancer has been monitored by his urologist, treatment was not recommended -will follow up clinically   5. HTN -He is on amlodipine and hydrocortisone reside, he states his blood pressure has been much lower than before -We discussed the impact of chemotherapy on his blood pressure, and I recommend him to hold hydrochlorothiazide after chemotherapy, especially when he has diarrhea  Plan -C3D1 gemcitabine and Abraxane today, hold chemotherapy after today  -He will start SBRT soon  -I will see him one week after SBRT, he will call me for appointment   All questions were answered. The patient knows to call the clinic with any problems, questions or concerns. I spent 20 minutes counseling the patient face to face. The total time spent in the appointment was 25 minutes and more than 50% was on counseling.  Truitt Merle, MD 12/10/2015 9:12 AM

## 2015-12-10 NOTE — Patient Instructions (Signed)
Current plan is for you to complete your chemo today and then start SBRT. Radiation Oncology will call you with your simulation date.

## 2015-12-10 NOTE — Patient Instructions (Signed)
[facility] Discharge Instructions for Patients Receiving Chemotherapy  Today you received the following chemotherapy agents: Abraxane and Gemzar   To help prevent nausea and vomiting after your treatment, we encourage you to take your nausea medication as directed.    If you develop nausea and vomiting that is not controlled by your nausea medication, call the clinic.   BELOW ARE SYMPTOMS THAT SHOULD BE REPORTED IMMEDIATELY:  *FEVER GREATER THAN 100.5 F  *CHILLS WITH OR WITHOUT FEVER  NAUSEA AND VOMITING THAT IS NOT CONTROLLED WITH YOUR NAUSEA MEDICATION  *UNUSUAL SHORTNESS OF BREATH  *UNUSUAL BRUISING OR BLEEDING  TENDERNESS IN MOUTH AND THROAT WITH OR WITHOUT PRESENCE OF ULCERS  *URINARY PROBLEMS  *BOWEL PROBLEMS  UNUSUAL RASH Items with * indicate a potential emergency and should be followed up as soon as possible.  Feel free to call the clinic you have any questions or concerns. The clinic phone number is [phone].  Please show the CHEMO ALERT CARD at check-in to the Emergency Department and triage nurse.   

## 2015-12-10 NOTE — Progress Notes (Signed)
Radiation Oncology         [phone] ________________________________  Name: [name] MRN: [id number]  Date: 12/10/2015  DOB: 10-15-1935  [id number] Lorretta Harp, MD  Binnie Rail, MD     REFERRING PHYSICIAN: Binnie Rail, MD   DIAGNOSIS: The encounter diagnosis was Primary pancreatic cancer [facility]).   HISTORY OF PRESENT ILLNESS: [name] is a 80 y.o. male seen at the request of Dr. Burr Medico for consideration of SBRT to the pancreas. The patient was diagnosed in July 2017 with Stage IIB, T2, N1, M0 adenocarcinoma of the pancreatic body. He had an EUS on 09/30/15 revealing a 2.5 cm mass causing dilication of the main pancreatic duct and directly abutting the SMA. He did not have evidence radiographically for distant metastases but did have a 1.4 cm hepatoduodenal ligament node which was suspicious for disease. He began neoadjuvant chemotherapy with Gemzar and Abraxane on 10/15/15 and has completed 2 cycles of this. His interval CT scan revealed improvement of his primary tumor which now measures 2.2 x 2.7 cm, intimate with vascular structures and the previously noted lymph node of concern is no longer present. He is seen today in multidisciplinary GI clinic to discuss the role for SBRT.   PREVIOUS RADIATION THERAPY: No   PAST MEDICAL HISTORY:  Past Medical History:  Diagnosis Date  . Arthritis    R hip and shoulder  . Complication of anesthesia    Local anesthetic used in office for Prostate biopsy" reaction Tachycardia,nausea, hallucinations, Blood pressure elevated"- No problems once done at hospital with anesthesia" Thinks Ciprofloxacin was the injection"   . Diabetes mellitus   . Enlarged prostate   . Femoral bruit    R femoral  . GERD with stricture    PMH of  . Hyperlipidemia   . Hypertension   . Macular degeneration    glaucoma suspect  . PONV (postoperative nausea and vomiting)   . Prostate cancer [facility])    prostate ; Dr Wyvonnia Lora to be monitored x 5 yrs now.    . Squamous papilloma    of esophagus       PAST SURGICAL HISTORY: Past Surgical History:  Procedure Laterality Date  . ANKLE FUSION  2004  . COLONOSCOPY  05/2012  . esophageal dilation  2002   Dr  Lucio Edward  . ESOPHAGOGASTRODUODENOSCOPY (EGD) WITH PROPOFOL N/A 09/30/2015   Procedure: ESOPHAGOGASTRODUODENOSCOPY (EGD) WITH PROPOFOL;  Surgeon: Milus Banister, MD;  [facility];  Service: Endoscopy;  Laterality: N/A;  . EUS N/A 09/30/2015   Procedure: UPPER ENDOSCOPIC ULTRASOUND (EUS) RADIAL;  Surgeon: Milus Banister, MD;  [facility];  Service: Endoscopy;  Laterality: N/A;  . FINE NEEDLE ASPIRATION N/A 09/30/2015   Procedure: FINE NEEDLE ASPIRATION (FNA) LINEAR;  Surgeon: Milus Banister, MD;  [facility];  Service: Endoscopy;  Laterality: N/A;  . HERNIA REPAIR     bil inguinal hernias  . PARTIAL COLECTOMY     perforated diverticulitis  . PORTACATH PLACEMENT Left 10/13/2015   Procedure: INSERTION PORT-A-CATH;  Surgeon: [name], MD;  [facility];  Service: General;  Laterality: Left;  . PROSTATE BIOPSY  03/29/2012   Procedure: BIOPSY TRANSRECTAL ULTRASONIC PROSTATE (TUBP);  Surgeon: Fredricka Bonine, MD;  Location[facility];  Service: Urology;  Laterality: N/A;  . ROTATOR CUFF REPAIR  2006  . TOTAL HIP ARTHROPLASTY Left 2012     FAMILY HISTORY:  Family History  Problem Relation Age of Onset  .  Kidney disease Father     ? Rheumatic fever as child  . Hypertension Father   . Heart attack Paternal Uncle     in 32s  . COPD Paternal Uncle     due to exposures in [facility] 2  . Kidney disease Paternal Aunt     ? Rheumatic fever as child  . Cancer Neg Hx   . Diabetes Neg Hx   . Stroke Neg Hx   . Colon cancer Neg Hx      SOCIAL HISTORY:  reports that he quit smoking about 26 years ago. His smoking use included Cigarettes. He has a 20.00 pack-year smoking history. He has never used smokeless tobacco. He reports that he does  not drink alcohol or use drugs. The patient is married and lives in [place].   ALLERGIES: Ace inhibitors; Angiotensin receptor blockers; and Ciprofloxacin   MEDICATIONS:  Current Outpatient Prescriptions  Medication Sig Dispense Refill  . amLODipine (NORVASC) 10 MG tablet Take 1 tablet (10 mg total) by mouth daily. 90 tablet 3  . aspirin 81 MG tablet Take 1 tablet (81 mg total) by mouth daily. 30 tablet   . doxazosin (CARDURA) 1 MG tablet TAKE 1 TABLET BY MOUTH EVERY DAY 90 tablet 2  . glucose blood (ONETOUCH VERIO) test strip Check blood sugar once daily 100 each 12  . hydrochlorothiazide (MICROZIDE) 12.5 MG capsule Take 1 capsule (12.5 mg total) by mouth daily. 90 capsule 3  . latanoprost (XALATAN) 0.005 % ophthalmic solution Place 1 drop into both eyes at bedtime.  1  . lidocaine-prilocaine (EMLA) cream Apply 1 application topically as needed. Apply to pac site 1 hour prior to stick and cover with plastic wrap 30 g 11  . metFORMIN (GLUCOPHAGE) 500 MG tablet Take 1 tablet (500 mg total) by mouth 2 (two) times daily with a meal. 180 tablet 3  . multivitamin-lutein (OCUVITE-LUTEIN) CAPS Take 1 capsule by mouth daily.    . naproxen sodium (ANAPROX) 220 MG tablet Take 220 mg by mouth 2 (two) times daily with a meal.    . ondansetron (ZOFRAN) 8 MG tablet Take 1 tablet (8 mg total) by mouth 2 (two) times daily as needed (Nausea or vomiting). (Patient not taking: Reported on 10/11/2015) 30 tablet 1  . ONETOUCH DELICA LANCETS 99991111 MISC Use to check blood sugars once a day Dx E11.9 100 each 3  . ONETOUCH VERIO test strip CHECK BLOOD SUGAR ONCE DAILY. DX250.00 100 each 5  . oxyCODONE (OXY IR/ROXICODONE) 5 MG immediate release tablet Take 1-2 tablets (5-10 mg total) by mouth every 6 (six) hours as needed for moderate pain, severe pain or breakthrough pain. 30 tablet 0  . pravastatin (PRAVACHOL) 20 MG tablet Take 1 tablet (20 mg total) by mouth at bedtime. 90 tablet 3  . prochlorperazine  (COMPAZINE) 10 MG tablet Take 1 tablet (10 mg total) by mouth every 6 (six) hours as needed (Nausea or vomiting). (Patient not taking: Reported on 10/11/2015) 30 tablet 1   No current facility-administered medications for this encounter.      REVIEW OF SYSTEMS: On review of systems, the patient reports that he is doing well overall. He denies any chest pain, shortness of breath, cough, fevers, chills, night sweats, unintended weight changes. He denies any bowel or bladder disturbances, and denies abdominal pain, nausea or vomiting. He denies any new musculoskeletal or joint aches or pains. A complete review of systems is obtained and is otherwise negative.     PHYSICAL EXAM:  Wt Readings from Last 3 Encounters:  12/01/15 127 lb (57.6 kg)  11/11/15 126 lb 8 oz (57.4 kg)  10/22/15 129 lb 14.4 oz (58.9 kg)   Temp Readings from Last 3 Encounters:  12/01/15 98 F (36.7 C) (Oral)  11/26/15 98.9 F (37.2 C) (Oral)  11/19/15 98.2 F (36.8 C) (Oral)   BP Readings from Last 3 Encounters:  12/01/15 (!) 133/55  11/26/15 121/82  11/19/15 136/60   Pulse Readings from Last 3 Encounters:  12/01/15 92  11/26/15 79  11/19/15 81   In general this is a well appearing African American male in no acute distress. He is alert and oriented x4 and appropriate throughout the examination. HEENT reveals that the patient is normocephalic, atraumatic. EOMs are intact. PERRLA. Skin is intact without any evidence of gross lesions. Cardiovascular exam reveals a regular rate and rhythm, no clicks rubs or murmurs are auscultated. Chest is clear to auscultation bilaterally. Lymphatic assessment is performed and does not reveal any adenopathy in the cervical, supraclavicular, axillary, or inguinal chains. Abdomen has active bowel sounds in all quadrants and is intact. The abdomen is soft, non tender, non distended. Lower extremities are negative for pretibial pitting edema, deep calf tenderness, cyanosis or  clubbing.   ECOG = 1  0 - Asymptomatic (Fully active, able to carry on all predisease activities without restriction)  1 - Symptomatic but completely ambulatory (Restricted in physically strenuous activity but ambulatory and able to carry out work of a light or sedentary nature. For example, light housework, office work)  2 - Symptomatic, <50% in bed during the day (Ambulatory and capable of all self care but unable to carry out any work activities. Up and about more than 50% of waking hours)  3 - Symptomatic, >50% in bed, but not bedbound (Capable of only limited self-care, confined to bed or chair 50% or more of waking hours)  4 - Bedbound (Completely disabled. Cannot carry on any self-care. Totally confined to bed or chair)  5 - Death   Eustace Pen MM, Creech RH, Tormey DC, et al. [phone]). "Toxicity and response criteria of the [facility]". [place] Oncol. 5 (6): 649-55    LABORATORY DATA:  Lab Results  Component Value Date   WBC 3.4 (L) 11/26/2015   HGB 11.8 (L) 11/26/2015   HCT 34.6 (L) 11/26/2015   MCV 80.3 11/26/2015   PLT 159 11/26/2015   Lab Results  Component Value Date   NA 141 11/26/2015   K 3.7 11/26/2015   CL 103 09/07/2015   CO2 27 11/26/2015   Lab Results  Component Value Date   ALT 18 11/26/2015   AST 22 11/26/2015   ALKPHOS 84 11/26/2015   BILITOT 0.39 11/26/2015      RADIOGRAPHY: Ct Abdomen Pelvis W Contrast  Addendum Date: 12/07/2015   ADDENDUM REPORT: 12/07/2015 16:36 ADDENDUM: Upon further review, the superior mesenteric vein is uninvolved by the mass, and is clearly widely patent excepting some collateral flow from multiple splenoportal collateral pathway is bypassing the narrowed splenic vein. Electronically Signed   By: Vinnie Langton M.D.   On: 12/07/2015 16:36   Result Date: 12/07/2015 CLINICAL DATA:  80 year old male with history of pancreatic cancer diagnosed 2 months ago. Chemotherapy currently in progress. History  of colonic resection due to obstruction. EXAM: CT ABDOMEN AND PELVIS WITH CONTRAST TECHNIQUE: Multidetector CT imaging of the abdomen and pelvis was performed using the standard protocol following bolus administration of intravenous contrast. CONTRAST:  194mL  ISOVUE-300 IOPAMIDOL (ISOVUE-300) INJECTION 61% COMPARISON:  CT the abdomen and pelvis 10/06/2015. FINDINGS: Lower chest: Mild scarring in the visualize lung bases. Mild centrilobular emphysema. Hepatobiliary: Several sub cm low-attenuation lesions are noted in the liver, similar to prior study 10/06/2015, and although these are too small to definitively characterize, these are favored to represent tiny cysts. No other new suspicious hepatic lesions are noted. No intra or extrahepatic biliary ductal dilatation. Gallbladder is normal in appearance. Pancreas: Previously noted hypovascular mass in the mid body of the pancreas appears slightly smaller than the prior examination, currently measuring 2.2 x 2.7 cm (image 36 of series 2). This lesion is intimately associated with multiple vascular structures, including the splenic artery and vein (adjacent to the splenoportal confluence), the anterior surface of the superior mesenteric artery (makes contact with this over approximately 30% of its circumference), and the anterior pancreaticoduodenal artery. Additionally, these superior mesenteric vein appears occluded by this lesion (multiple collateral veins appear bypass cystic lesion in the upper abdomen). This is again associated with ductal dilatation throughout the distal body and tail of the pancreas, similar to the prior examination. Head, uncinate process and proximal body of the pancreas are normal in appearance. No peripancreatic inflammatory changes. Spleen: Unremarkable. Adrenals/Urinary Tract: Again noted are multiple well-defined low-attenuation lesions in the kidneys bilaterally, compatible with simple cysts, largest of which measures 2.7 cm in the upper  pole of the left kidney. No hydroureteronephrosis. Urinary bladder is partially obscured by beam hardening artifact from the patient's left hip arthroplasty, but is generally unremarkable in appearance. Stomach/Bowel: The appearance of the stomach is normal. There is no pathologic dilatation of small bowel or colon. Numerous colonic diverticulae are noted, without surrounding inflammatory changes to suggest an acute diverticulitis at this time. Status post appendectomy. Vascular/Lymphatic: Aortic atherosclerosis, without evidence of aneurysm or dissection in the abdominal or pelvic vasculature. No lymphadenopathy noted in the abdomen or pelvis. Specifically, previously noted enlarged hepatoduodenal ligament lymph node is no longer identified. Reproductive: Prostate gland appears enlarged and heterogeneous measuring up to 3.7 x 6.2 cm. Median lobe hypertrophy. Seminal vesicles are unremarkable in appearance. Other: No significant volume of ascites.  No pneumoperitoneum. Musculoskeletal: There are no aggressive appearing lytic or blastic lesions noted in the visualized portions of the skeleton. Status post left total hip arthroplasty. IMPRESSION: 1. Today's study demonstrates a positive response to therapy as evidenced by regression of hypovascular mass in the body of the pancreas, and regression of the previously noted enlarged hepatoduodenal ligament lymph node. No new sites of metastatic disease are noted elsewhere in the abdomen or pelvis. 2. Aortic atherosclerosis. 3. Colonic diverticulosis without evidence of acute diverticulitis at this time. 4. Prostatomegaly with median lobe hypertrophy. 5. Mild centrilobular emphysema. Electronically Signed: By: Vinnie Langton M.D. On: 12/07/2015 12:59       IMPRESSION/PLAN: 1. Stage IIB, T2, N1, M0 adenocarcinoma of the pancreatic body. I discussed the findings to date from the patient's pathology and his most recent imaging. He has had an appropriate response to his  neoadjuvant regimen, he will proceed with cycle #3 of his Gemzar/Abraxane today. We discussed the options for consideration of stereotactic body radiation treatment (SBRT) to the pancreas in hopes of achieving local response in lieu of surgical resection. He will meet today with Dr. Barry Dienes as well. I have reviewed the risks, benefits, short, and long term effects of radiotherapy. The patient is interested in moving forward with this. We have contacted Dr. Threasa Beards at [facility] to refer the patient  for fiducial marker placement in preparation for SBRT, he will need to have this approximately 3 weeks from now due to his chemotherapy regimen. He is in agreement with this plan and we will coordinate his care by phone.   ________________________________   Jodelle Gross, MD, PhD

## 2015-12-10 NOTE — Progress Notes (Signed)
Nutrition follow-up completed with patient during infusion for pancreas cancer. Weight is stable and documented as 127 pounds September 15. Patient reports he feels well, especially the week after his chemotherapy. Reports today is his last chemotherapy and he will be receiving radiation treatments. Reports increased oral intake and is consuming Ensure Plus once daily.  Nutrition diagnosis:  Food and nutrition related knowledge deficit has improved.  Intervention: Patient educated to increase Ensure Plus twice a day Offered coupons.  The patient has enough for now. Encouraged patient to contact me if he has further questions or concerns as today is his last chemotherapy.  No follow-up is scheduled.  Patient will contact me for any questions or concerns during radiation therapy.  **Disclaimer: This note was dictated with voice recognition software. Similar sounding words can inadvertently be transcribed and this note may contain transcription errors which may not have been corrected upon publication of note.**

## 2015-12-14 ENCOUNTER — Other Ambulatory Visit: Payer: Self-pay | Admitting: Radiation Oncology

## 2015-12-14 ENCOUNTER — Telehealth: Payer: Self-pay | Admitting: Radiation Oncology

## 2015-12-14 DIAGNOSIS — C259 Malignant neoplasm of pancreas, unspecified: Secondary | ICD-10-CM

## 2015-12-14 NOTE — Telephone Encounter (Signed)
LM for the patient to let him know I was setting up fiducial markers for the patient at [facility] with Dr. Jerene Pitch. I encouraged him to call me back to discuss further.

## 2015-12-16 ENCOUNTER — Telehealth: Payer: Self-pay | Admitting: Radiation Oncology

## 2015-12-16 NOTE — Telephone Encounter (Signed)
I called the patient and his wife was able to take the call to get him scheduled for simulation on 10/2.

## 2015-12-19 NOTE — Patient Instructions (Addendum)
  Test(s) ordered today. Your results will be released to [place] (or called to you) after review, usually within 72hours after test completion. If any changes need to be made, you will be notified at that same time.  All other Health Maintenance issues reviewed.   All recommended immunizations and age-appropriate screenings are up-to-date or discussed.  Flu vaccine administered today.   Medications reviewed and updated.  No changes recommended at this time.   Please followup in 6 month

## 2015-12-19 NOTE — Progress Notes (Signed)
Subjective:    Patient ID: [name], male    DOB: 12-Jan-1936, 80 y.o.   MRN: [id number]  HPI The patient is here for follow up.  Pancreatic cancer:  He just finished chemotherapy and will be starting radiation.  He overall feels good.  His appetite is improving and his energy level is good.  He is having swelling in his left leg and had an [place] which showed no DVT.  The swelling is worse at the end of the day and does bother him.    Hypertension: He is taking his medication daily. He is compliant with a low sodium diet.  He denies chest pain, palpitations, shortness of breath and regular headaches.      Diabetes: He is taking his medication daily as prescribed. He is compliant with a diabetic diet. He is active.  He monitors his sugars and they have been running 124, 117, 129. He checks his feet daily and denies foot lesions. He is up-to-date with an ophthalmology examination.   Hyperlipidemia: He is taking his medication daily. He is compliant with a low fat/cholesterol diet. He is staying active. He denies myalgias.    Medications and allergies reviewed with patient and updated if appropriate.  Patient Active Problem List   Diagnosis Date Noted  . Bilateral leg edema 12/01/2015  . Prostate cancer ([place]) 10/22/2015  . Malignant neoplasm of body of pancreas ([place]) 10/07/2015  . AP (abdominal pain) 09/07/2015  . Glaucoma 06/17/2015  . Macular degeneration 06/17/2015  . Dysphagia, pharyngoesophageal phase 09/23/2014  . Hypokalemia 03/17/2014  . ANGIOEDEMA 12/22/2009  . PROSTATE CANCER, HX OF 12/22/2009  . Hyperlipidemia 04/13/2009  . DIVERTICULOSIS, COLON 10/28/2007  . COLONIC POLYPS, HX OF 10/28/2007  . Diabetes type 2, controlled ([place]) 11/22/2006  . Essential hypertension 09/06/2006    Current Outpatient Prescriptions on File Prior to Visit  Medication Sig Dispense Refill  . amLODipine (NORVASC) 10 MG tablet Take 1 tablet (10 mg total) by mouth daily. 90 tablet 3  .  aspirin 81 MG tablet Take 1 tablet (81 mg total) by mouth daily. 30 tablet   . doxazosin (CARDURA) 1 MG tablet TAKE 1 TABLET BY MOUTH EVERY DAY 90 tablet 2  . glucose blood (ONETOUCH VERIO) test strip Check blood sugar once daily 100 each 12  . hydrochlorothiazide (MICROZIDE) 12.5 MG capsule Take 1 capsule (12.5 mg total) by mouth daily. 90 capsule 3  . latanoprost (XALATAN) 0.005 % ophthalmic solution Place 1 drop into both eyes at bedtime.  1  . lidocaine-prilocaine (EMLA) cream Apply 1 application topically as needed. Apply to pac site 1 hour prior to stick and cover with plastic wrap 30 g 11  . metFORMIN (GLUCOPHAGE) 500 MG tablet Take 1 tablet (500 mg total) by mouth 2 (two) times daily with a meal. 180 tablet 3  . multivitamin-lutein (OCUVITE-LUTEIN) CAPS Take 1 capsule by mouth daily.    Glory Rosebush DELICA LANCETS 99991111 MISC Use to check blood sugars once a day Dx E11.9 100 each 3  . ONETOUCH VERIO test strip CHECK BLOOD SUGAR ONCE DAILY. DX250.00 100 each 5  . pravastatin (PRAVACHOL) 20 MG tablet Take 1 tablet (20 mg total) by mouth at bedtime. 90 tablet 3   No current facility-administered medications on file prior to visit.     Past Medical History:  Diagnosis Date  . Arthritis    R hip and shoulder  . Complication of anesthesia    Local anesthetic used in office for  Prostate biopsy" reaction Tachycardia,nausea, hallucinations, Blood pressure elevated"- No problems once done at hospital with anesthesia" Thinks Ciprofloxacin was the injection"   . Diabetes mellitus   . Enlarged prostate   . Femoral bruit    R femoral  . GERD with stricture    PMH of  . Hyperlipidemia   . Hypertension   . Macular degeneration    glaucoma suspect  . PONV (postoperative nausea and vomiting)   . Prostate cancer [facility])    prostate ; Dr Wyvonnia Lora to be monitored x 5 yrs now.  . Squamous papilloma    of esophagus    Past Surgical History:  Procedure Laterality Date  . ANKLE FUSION   2004  . COLONOSCOPY  05/2012  . esophageal dilation  2002   Dr  Lucio Edward  . ESOPHAGOGASTRODUODENOSCOPY (EGD) WITH PROPOFOL N/A 09/30/2015   Procedure: ESOPHAGOGASTRODUODENOSCOPY (EGD) WITH PROPOFOL;  Surgeon: Milus Banister, MD;  [facility];  Service: Endoscopy;  Laterality: N/A;  . EUS N/A 09/30/2015   Procedure: UPPER ENDOSCOPIC ULTRASOUND (EUS) RADIAL;  Surgeon: Milus Banister, MD;  [facility];  Service: Endoscopy;  Laterality: N/A;  . FINE NEEDLE ASPIRATION N/A 09/30/2015   Procedure: FINE NEEDLE ASPIRATION (FNA) LINEAR;  Surgeon: Milus Banister, MD;  [facility];  Service: Endoscopy;  Laterality: N/A;  . HERNIA REPAIR     bil inguinal hernias  . PARTIAL COLECTOMY     perforated diverticulitis  . PORTACATH PLACEMENT Left 10/13/2015   Procedure: INSERTION PORT-A-CATH;  Surgeon: [name], MD;  [facility];  Service: General;  Laterality: Left;  . PROSTATE BIOPSY  03/29/2012   Procedure: BIOPSY TRANSRECTAL ULTRASONIC PROSTATE (TUBP);  Surgeon: Fredricka Bonine, MD;  [facility];  Service: Urology;  Laterality: N/A;  . ROTATOR CUFF REPAIR  2006  . TOTAL HIP ARTHROPLASTY Left 2012    Social History   Social History  . Marital status: Married    Spouse name: Deloris  . Number of children: 1  . Years of education: N/A   Occupational History  . Retired     Ecologist in [place]  . Smoking status: Former Smoker    Packs/day: 0.50    Years: 40.00    Types: Cigarettes    Quit date: 03/27/1989  . Smokeless tobacco: Never Used     Comment: smoked [place], up to 1 ppd  . Alcohol use No  . Drug use: No  . Sexual activity: Not on file   Other Topics Concern  . Not on file   Social History Narrative   Married to wife, Deloris for 54+ years   Retired here from [employer] in [employer]   Enjoys outdoor activities and bikes 2 miles/day   Has one grown son and teen  granddaughter in area    Family History  Problem Relation Age of Onset  . Kidney disease Father     ? Rheumatic fever as child  . Hypertension Father   . Heart attack Paternal Uncle     in 67s  . COPD Paternal Uncle     due to exposures in [facility]  . Kidney disease Paternal Aunt     ? Rheumatic fever as child  . Cancer Neg Hx   . Diabetes Neg Hx   . Stroke Neg Hx   . Colon cancer Neg Hx     Review of Systems  Constitutional: Negative  for fever.  Respiratory: Negative for cough, shortness of breath and wheezing.   Cardiovascular: Positive for leg swelling. Negative for chest pain and palpitations.  Neurological: Negative for dizziness, light-headedness and headaches.       Objective:   Vitals:   12/20/15 1057  BP: (!) 152/82  Pulse: 84  Resp: 16  Temp: 97.8 F (36.6 C)   Filed Weights   12/20/15 1057  Weight: 127 lb (57.6 kg)   Body mass index is 18.22 kg/m.   Physical Exam    Constitutional: Appears well-developed and well-nourished. No distress.  HENT:  Head: Normocephalic and atraumatic.  Neck: Neck supple. No tracheal deviation present. No thyromegaly present.  Cardiovascular: Normal rate, regular rhythm and normal heart sounds.   No murmur heard. No carotid bruit   2 + edema left leg, no edema in right leg Pulmonary/Chest: Effort normal and breath sounds normal. No respiratory distress. No has no wheezes. No rales.  Lymphadenopathy: No cervical adenopathy.  Skin: Skin is warm and dry. Not diaphoretic.  Psychiatric: Normal mood and affect. Behavior is normal.     Assessment & Plan:   Flu vaccine   See Problem List for Assessment and Plan of chronic medical problems.

## 2015-12-20 ENCOUNTER — Ambulatory Visit (INDEPENDENT_AMBULATORY_CARE_PROVIDER_SITE_OTHER): Payer: Medicare Other | Admitting: Internal Medicine

## 2015-12-20 ENCOUNTER — Other Ambulatory Visit (INDEPENDENT_AMBULATORY_CARE_PROVIDER_SITE_OTHER): Payer: Medicare Other

## 2015-12-20 ENCOUNTER — Telehealth: Payer: Self-pay | Admitting: *Deleted

## 2015-12-20 ENCOUNTER — Encounter: Payer: Self-pay | Admitting: Internal Medicine

## 2015-12-20 VITALS — BP 152/82 | HR 84 | Temp 97.8°F | Resp 16 | Wt 127.0 lb

## 2015-12-20 DIAGNOSIS — E785 Hyperlipidemia, unspecified: Secondary | ICD-10-CM

## 2015-12-20 DIAGNOSIS — E119 Type 2 diabetes mellitus without complications: Secondary | ICD-10-CM

## 2015-12-20 DIAGNOSIS — Z23 Encounter for immunization: Secondary | ICD-10-CM | POA: Diagnosis not present

## 2015-12-20 DIAGNOSIS — C251 Malignant neoplasm of body of pancreas: Secondary | ICD-10-CM | POA: Diagnosis not present

## 2015-12-20 DIAGNOSIS — I10 Essential (primary) hypertension: Secondary | ICD-10-CM

## 2015-12-20 LAB — HEMOGLOBIN A1C: HEMOGLOBIN A1C: 7.2 % — AB (ref 4.6–6.5)

## 2015-12-20 NOTE — Assessment & Plan Note (Signed)
Sugars well controlled at home Check a1c Continue metformin 

## 2015-12-20 NOTE — Assessment & Plan Note (Signed)
Not fasting so will not check lipid panel Continue pravastatin

## 2015-12-20 NOTE — Assessment & Plan Note (Signed)
BP elevated here today Continue to monitor - typically better controlled Continue current medications  BP Readings from Last 3 Encounters:  12/20/15 (!) 152/82  12/10/15 (!) 141/63  12/01/15 (!) 133/55

## 2015-12-20 NOTE — Progress Notes (Signed)
Pre visit review using our clinic review tool, if applicable. No additional management support is needed unless otherwise documented below in the visit note. 

## 2015-12-20 NOTE — Telephone Encounter (Signed)
Oncology Nurse Navigator Documentation  Oncology Nurse Navigator Flowsheets 12/20/2015  Navigator Location [facility]  Navigator Encounter Type Telephone  Telephone Incoming Call;Outgoing Call;FMLA/Disability--Has FMLA form to be completed for his son. Asking how to go about getting it completed?  Abnormal Finding Date -  Confirmed Diagnosis Date -  Treatment Initiated Date -  Patient Visit Type -  Treatment Phase -  Barriers/Navigation Needs -  Education Other--called wife back and instructed to bring form to front desk and complete brief intake form and both will be forwarded to managed care to complete and have MD sign. He will be notified when completed.  Interventions -  Referrals -  Coordination of Care -  Education Method -  Support Groups/Services -  Acuity -  Time Spent with Patient 15

## 2015-12-20 NOTE — Assessment & Plan Note (Signed)
Completed chemo Will be starting radiation soon

## 2015-12-22 ENCOUNTER — Encounter: Payer: Self-pay | Admitting: Internal Medicine

## 2015-12-23 DIAGNOSIS — R430 Anosmia: Secondary | ICD-10-CM | POA: Diagnosis not present

## 2015-12-23 DIAGNOSIS — I10 Essential (primary) hypertension: Secondary | ICD-10-CM | POA: Diagnosis not present

## 2015-12-23 DIAGNOSIS — Z791 Long term (current) use of non-steroidal anti-inflammatories (NSAID): Secondary | ICD-10-CM | POA: Diagnosis not present

## 2015-12-23 DIAGNOSIS — Z883 Allergy status to other anti-infective agents status: Secondary | ICD-10-CM | POA: Diagnosis not present

## 2015-12-23 DIAGNOSIS — Z87891 Personal history of nicotine dependence: Secondary | ICD-10-CM | POA: Diagnosis not present

## 2015-12-23 DIAGNOSIS — Z86010 Personal history of colonic polyps: Secondary | ICD-10-CM | POA: Diagnosis not present

## 2015-12-23 DIAGNOSIS — J342 Deviated nasal septum: Secondary | ICD-10-CM | POA: Diagnosis not present

## 2015-12-23 DIAGNOSIS — E119 Type 2 diabetes mellitus without complications: Secondary | ICD-10-CM | POA: Diagnosis not present

## 2015-12-23 DIAGNOSIS — Z7982 Long term (current) use of aspirin: Secondary | ICD-10-CM | POA: Diagnosis not present

## 2015-12-23 DIAGNOSIS — Z888 Allergy status to other drugs, medicaments and biological substances status: Secondary | ICD-10-CM | POA: Diagnosis not present

## 2015-12-23 DIAGNOSIS — K219 Gastro-esophageal reflux disease without esophagitis: Secondary | ICD-10-CM | POA: Diagnosis not present

## 2015-12-23 DIAGNOSIS — Z8546 Personal history of malignant neoplasm of prostate: Secondary | ICD-10-CM | POA: Diagnosis not present

## 2015-12-23 DIAGNOSIS — M1990 Unspecified osteoarthritis, unspecified site: Secondary | ICD-10-CM | POA: Diagnosis not present

## 2015-12-23 DIAGNOSIS — Z79899 Other long term (current) drug therapy: Secondary | ICD-10-CM | POA: Diagnosis not present

## 2015-12-23 DIAGNOSIS — Z7984 Long term (current) use of oral hypoglycemic drugs: Secondary | ICD-10-CM | POA: Diagnosis not present

## 2015-12-23 DIAGNOSIS — C259 Malignant neoplasm of pancreas, unspecified: Secondary | ICD-10-CM | POA: Diagnosis not present

## 2015-12-23 DIAGNOSIS — Z9049 Acquired absence of other specified parts of digestive tract: Secondary | ICD-10-CM | POA: Diagnosis not present

## 2015-12-25 ENCOUNTER — Telehealth: Payer: Self-pay

## 2015-12-25 NOTE — Telephone Encounter (Signed)
Faxed fmla paperwork for son to [employer] 12/23/15

## 2015-12-27 ENCOUNTER — Ambulatory Visit
Admission: RE | Admit: 2015-12-27 | Discharge: 2015-12-27 | Disposition: A | Discharge status: Home / Self Care | Payer: Medicare Other | Source: Ambulatory Visit | Attending: Radiation Oncology | Admitting: Radiation Oncology

## 2015-12-27 DIAGNOSIS — Z79899 Other long term (current) drug therapy: Secondary | ICD-10-CM | POA: Insufficient documentation

## 2015-12-27 DIAGNOSIS — I10 Essential (primary) hypertension: Secondary | ICD-10-CM | POA: Diagnosis not present

## 2015-12-27 DIAGNOSIS — Z888 Allergy status to other drugs, medicaments and biological substances status: Secondary | ICD-10-CM | POA: Insufficient documentation

## 2015-12-27 DIAGNOSIS — C251 Malignant neoplasm of body of pancreas: Secondary | ICD-10-CM | POA: Diagnosis not present

## 2015-12-27 DIAGNOSIS — E785 Hyperlipidemia, unspecified: Secondary | ICD-10-CM | POA: Insufficient documentation

## 2015-12-27 DIAGNOSIS — Z881 Allergy status to other antibiotic agents status: Secondary | ICD-10-CM | POA: Insufficient documentation

## 2015-12-27 DIAGNOSIS — Z7984 Long term (current) use of oral hypoglycemic drugs: Secondary | ICD-10-CM | POA: Insufficient documentation

## 2015-12-27 DIAGNOSIS — E119 Type 2 diabetes mellitus without complications: Secondary | ICD-10-CM | POA: Diagnosis not present

## 2015-12-27 DIAGNOSIS — Z510 Encounter for antineoplastic radiation therapy: Secondary | ICD-10-CM | POA: Diagnosis not present

## 2015-12-27 DIAGNOSIS — Z7982 Long term (current) use of aspirin: Secondary | ICD-10-CM | POA: Diagnosis not present

## 2015-12-27 DIAGNOSIS — Z87891 Personal history of nicotine dependence: Secondary | ICD-10-CM | POA: Insufficient documentation

## 2016-01-05 ENCOUNTER — Telehealth: Payer: Self-pay | Admitting: Hematology

## 2016-01-05 NOTE — Telephone Encounter (Signed)
lvm to inform pt of 11/2 appt per LOS

## 2016-01-10 ENCOUNTER — Ambulatory Visit: Payer: Medicare Other | Admitting: Radiation Oncology

## 2016-01-10 DIAGNOSIS — E119 Type 2 diabetes mellitus without complications: Secondary | ICD-10-CM | POA: Diagnosis not present

## 2016-01-10 DIAGNOSIS — I10 Essential (primary) hypertension: Secondary | ICD-10-CM | POA: Diagnosis not present

## 2016-01-10 DIAGNOSIS — C251 Malignant neoplasm of body of pancreas: Secondary | ICD-10-CM | POA: Diagnosis not present

## 2016-01-10 DIAGNOSIS — Z79899 Other long term (current) drug therapy: Secondary | ICD-10-CM | POA: Diagnosis not present

## 2016-01-10 DIAGNOSIS — Z510 Encounter for antineoplastic radiation therapy: Secondary | ICD-10-CM | POA: Diagnosis not present

## 2016-01-10 DIAGNOSIS — E785 Hyperlipidemia, unspecified: Secondary | ICD-10-CM | POA: Diagnosis not present

## 2016-01-12 ENCOUNTER — Encounter: Payer: Self-pay | Admitting: Radiation Oncology

## 2016-01-12 ENCOUNTER — Inpatient Hospital Stay: Admission: RE | Admit: 2016-01-12 | Payer: Self-pay | Source: Ambulatory Visit | Admitting: Radiation Oncology

## 2016-01-12 ENCOUNTER — Ambulatory Visit
Admission: RE | Admit: 2016-01-12 | Discharge: 2016-01-12 | Disposition: A | Discharge status: Home / Self Care | Payer: Medicare Other | Source: Ambulatory Visit | Attending: Radiation Oncology | Admitting: Radiation Oncology

## 2016-01-12 DIAGNOSIS — C251 Malignant neoplasm of body of pancreas: Secondary | ICD-10-CM | POA: Diagnosis not present

## 2016-01-12 DIAGNOSIS — Z510 Encounter for antineoplastic radiation therapy: Secondary | ICD-10-CM | POA: Diagnosis not present

## 2016-01-12 DIAGNOSIS — I10 Essential (primary) hypertension: Secondary | ICD-10-CM | POA: Diagnosis not present

## 2016-01-12 DIAGNOSIS — Z79899 Other long term (current) drug therapy: Secondary | ICD-10-CM | POA: Diagnosis not present

## 2016-01-12 DIAGNOSIS — E119 Type 2 diabetes mellitus without complications: Secondary | ICD-10-CM | POA: Diagnosis not present

## 2016-01-12 DIAGNOSIS — E785 Hyperlipidemia, unspecified: Secondary | ICD-10-CM | POA: Diagnosis not present

## 2016-01-12 HISTORY — DX: Malignant neoplasm of pancreas, unspecified: C25.9

## 2016-01-12 NOTE — Progress Notes (Signed)
Weekly rad SBRT  tx pancreas

## 2016-01-14 ENCOUNTER — Encounter: Payer: Self-pay | Admitting: Radiation Oncology

## 2016-01-14 ENCOUNTER — Ambulatory Visit
Admission: RE | Admit: 2016-01-14 | Discharge: 2016-01-14 | Disposition: A | Discharge status: Home / Self Care | Payer: Medicare Other | Source: Ambulatory Visit | Attending: Radiation Oncology | Admitting: Radiation Oncology

## 2016-01-14 DIAGNOSIS — I10 Essential (primary) hypertension: Secondary | ICD-10-CM | POA: Diagnosis not present

## 2016-01-14 DIAGNOSIS — E119 Type 2 diabetes mellitus without complications: Secondary | ICD-10-CM | POA: Diagnosis not present

## 2016-01-14 DIAGNOSIS — C251 Malignant neoplasm of body of pancreas: Secondary | ICD-10-CM | POA: Diagnosis not present

## 2016-01-14 DIAGNOSIS — Z510 Encounter for antineoplastic radiation therapy: Secondary | ICD-10-CM | POA: Diagnosis not present

## 2016-01-14 DIAGNOSIS — Z79899 Other long term (current) drug therapy: Secondary | ICD-10-CM | POA: Diagnosis not present

## 2016-01-14 DIAGNOSIS — E785 Hyperlipidemia, unspecified: Secondary | ICD-10-CM | POA: Diagnosis not present

## 2016-01-17 ENCOUNTER — Ambulatory Visit
Admission: RE | Admit: 2016-01-17 | Discharge: 2016-01-17 | Disposition: A | Discharge status: Home / Self Care | Payer: Medicare Other | Source: Ambulatory Visit | Attending: Radiation Oncology | Admitting: Radiation Oncology

## 2016-01-17 DIAGNOSIS — Z79899 Other long term (current) drug therapy: Secondary | ICD-10-CM | POA: Diagnosis not present

## 2016-01-17 DIAGNOSIS — E119 Type 2 diabetes mellitus without complications: Secondary | ICD-10-CM | POA: Diagnosis not present

## 2016-01-17 DIAGNOSIS — C251 Malignant neoplasm of body of pancreas: Secondary | ICD-10-CM | POA: Diagnosis not present

## 2016-01-17 DIAGNOSIS — I10 Essential (primary) hypertension: Secondary | ICD-10-CM | POA: Diagnosis not present

## 2016-01-17 DIAGNOSIS — E785 Hyperlipidemia, unspecified: Secondary | ICD-10-CM | POA: Diagnosis not present

## 2016-01-17 DIAGNOSIS — Z510 Encounter for antineoplastic radiation therapy: Secondary | ICD-10-CM | POA: Diagnosis not present

## 2016-01-19 ENCOUNTER — Ambulatory Visit
Admission: RE | Admit: 2016-01-19 | Discharge: 2016-01-19 | Disposition: A | Discharge status: Home / Self Care | Payer: Medicare Other | Source: Ambulatory Visit | Attending: Radiation Oncology | Admitting: Radiation Oncology

## 2016-01-19 DIAGNOSIS — E785 Hyperlipidemia, unspecified: Secondary | ICD-10-CM | POA: Diagnosis not present

## 2016-01-19 DIAGNOSIS — C251 Malignant neoplasm of body of pancreas: Secondary | ICD-10-CM | POA: Diagnosis not present

## 2016-01-19 DIAGNOSIS — E119 Type 2 diabetes mellitus without complications: Secondary | ICD-10-CM | POA: Diagnosis not present

## 2016-01-19 DIAGNOSIS — Z510 Encounter for antineoplastic radiation therapy: Secondary | ICD-10-CM | POA: Diagnosis not present

## 2016-01-19 DIAGNOSIS — Z79899 Other long term (current) drug therapy: Secondary | ICD-10-CM | POA: Diagnosis not present

## 2016-01-19 DIAGNOSIS — I10 Essential (primary) hypertension: Secondary | ICD-10-CM | POA: Diagnosis not present

## 2016-01-21 ENCOUNTER — Encounter: Payer: Self-pay | Admitting: Radiation Oncology

## 2016-01-21 ENCOUNTER — Ambulatory Visit
Admission: RE | Admit: 2016-01-21 | Discharge: 2016-01-21 | Disposition: A | Discharge status: Home / Self Care | Payer: Medicare Other | Source: Ambulatory Visit | Attending: Radiation Oncology | Admitting: Radiation Oncology

## 2016-01-21 VITALS — BP 118/62 | HR 79 | Temp 97.8°F | Ht 70.0 in | Wt 130.2 lb

## 2016-01-21 DIAGNOSIS — Z510 Encounter for antineoplastic radiation therapy: Secondary | ICD-10-CM | POA: Diagnosis not present

## 2016-01-21 DIAGNOSIS — Z79899 Other long term (current) drug therapy: Secondary | ICD-10-CM | POA: Diagnosis not present

## 2016-01-21 DIAGNOSIS — C251 Malignant neoplasm of body of pancreas: Secondary | ICD-10-CM

## 2016-01-21 DIAGNOSIS — I10 Essential (primary) hypertension: Secondary | ICD-10-CM | POA: Diagnosis not present

## 2016-01-21 DIAGNOSIS — E119 Type 2 diabetes mellitus without complications: Secondary | ICD-10-CM | POA: Diagnosis not present

## 2016-01-21 DIAGNOSIS — E785 Hyperlipidemia, unspecified: Secondary | ICD-10-CM | POA: Diagnosis not present

## 2016-01-21 NOTE — Progress Notes (Signed)
[name] has completed 5 SBRT to the Abdomen for 5 fractions - Pancreatic Cancer.  He denies any pain nor discomfort.  He reports that he has more energy as compared to prior start of tx.   Denies nausea and vomiting and eats anything he wants.  BP 118/62 (BP Location: Left Arm, Patient Position: Sitting, Cuff Size: Normal)   Pulse 79   Temp 97.8 F (36.6 C) (Oral)   Ht 5\' 10"  (1.778 m)   Wt 130 lb 3.2 oz (59.1 kg)   SpO2 99%   BMI 18.68 kg/m     Wt Readings from Last 3 Encounters:  01/21/16 130 lb 3.2 oz (59.1 kg)  12/20/15 127 lb (57.6 kg)  12/10/15 127 lb (57.6 kg)

## 2016-01-21 NOTE — Progress Notes (Signed)
Department of Radiation Oncology  Phone:  [phone] Fax:        [phone]  Weekly Treatment Note    Name: [name] Date: 01/21/2016 MRN: [id number] DOB: 04/17/1935   Diagnosis:     ICD-9-CM ICD-10-CM   1. Malignant neoplasm of body of pancreas ([place]) 157.1 C25.1      Current dose: 33 Gy  Current fraction:5   MEDICATIONS: Current Outpatient Prescriptions  Medication Sig Dispense Refill  . amLODipine (NORVASC) 10 MG tablet Take 1 tablet (10 mg total) by mouth daily. 90 tablet 3  . aspirin 81 MG tablet Take 1 tablet (81 mg total) by mouth daily. 30 tablet   . doxazosin (CARDURA) 1 MG tablet TAKE 1 TABLET BY MOUTH EVERY DAY 90 tablet 2  . glucose blood (ONETOUCH VERIO) test strip Check blood sugar once daily 100 each 12  . hydrochlorothiazide (MICROZIDE) 12.5 MG capsule Take 1 capsule (12.5 mg total) by mouth daily. 90 capsule 3  . latanoprost (XALATAN) 0.005 % ophthalmic solution Place 1 drop into both eyes at bedtime.  1  . lidocaine-prilocaine (EMLA) cream Apply 1 application topically as needed. Apply to pac site 1 hour prior to stick and cover with plastic wrap 30 g 11  . metFORMIN (GLUCOPHAGE) 500 MG tablet Take 1 tablet (500 mg total) by mouth 2 (two) times daily with a meal. 180 tablet 3  . multivitamin-lutein (OCUVITE-LUTEIN) CAPS Take 1 capsule by mouth daily.    Glory Rosebush DELICA LANCETS 99991111 MISC Use to check blood sugars once a day Dx E11.9 100 each 3  . ONETOUCH VERIO test strip CHECK BLOOD SUGAR ONCE DAILY. DX250.00 100 each 5  . pravastatin (PRAVACHOL) 20 MG tablet Take 1 tablet (20 mg total) by mouth at bedtime. 90 tablet 3   No current facility-administered medications for this encounter.      ALLERGIES: Ace inhibitors; Angiotensin receptor blockers; and Ciprofloxacin   LABORATORY DATA:  Lab Results  Component Value Date   WBC 12.5 (H) 12/10/2015   HGB 11.9 (L) 12/10/2015   HCT 36.6 (L) 12/10/2015   MCV 82.3 12/10/2015   PLT 288  12/10/2015   Lab Results  Component Value Date   NA 144 12/10/2015   K 3.7 12/10/2015   CL 103 09/07/2015   CO2 26 12/10/2015   Lab Results  Component Value Date   ALT 18 12/10/2015   AST 21 12/10/2015   ALKPHOS 89 12/10/2015   BILITOT 0.38 12/10/2015     NARRATIVE: Anson Crofts was seen today for weekly treatment management. The chart was checked and the patient's films were reviewed.  Mr. Fuelling has completed 5 SBRT to the Abdomen for 5 fractions - Pancreatic Cancer.  He denies any pain or discomfort.  He reports that he has more energy as compared to prior start of tx.  Denies nausea or vomiting and eats anything he wants.  PHYSICAL EXAMINATION: height is 5\' 10"  (1.778 m) and weight is 130 lb 3.2 oz (59.1 kg). His oral temperature is 97.8 F (36.6 C). His blood pressure is 118/62 and his pulse is 79. His oxygen saturation is 99%.   ASSESSMENT: The patient has done satisfactorily with treatment.  PLAN: The patient has completed treatment and will return in a month for a follow up. I advised the patient to take an antacid (such as Prilosec) for about 3 months to prevent and treat an upset stomach due to his dose of radiation to the abdomen possibly causing irritation  in the future.  ------------------------------------------------  Jodelle Gross, MD, PhD  This document serves as a record of services personally performed by Kyung Rudd, MD. It was created on his behalf by Darcus Austin, a trained medical scribe. The creation of this record is based on the scribe's personal observations and the provider's statements to them. This document has been checked and approved by the attending provider.

## 2016-01-27 ENCOUNTER — Ambulatory Visit (HOSPITAL_BASED_OUTPATIENT_CLINIC_OR_DEPARTMENT_OTHER): Payer: Medicare Other | Admitting: Hematology

## 2016-01-27 ENCOUNTER — Other Ambulatory Visit (HOSPITAL_BASED_OUTPATIENT_CLINIC_OR_DEPARTMENT_OTHER): Payer: Medicare Other

## 2016-01-27 ENCOUNTER — Telehealth: Payer: Self-pay | Admitting: Hematology

## 2016-01-27 ENCOUNTER — Encounter: Payer: Self-pay | Admitting: Hematology

## 2016-01-27 VITALS — BP 154/67 | HR 84 | Temp 98.4°F | Resp 17 | Ht 70.0 in | Wt 130.6 lb

## 2016-01-27 DIAGNOSIS — C61 Malignant neoplasm of prostate: Secondary | ICD-10-CM | POA: Diagnosis not present

## 2016-01-27 DIAGNOSIS — C259 Malignant neoplasm of pancreas, unspecified: Secondary | ICD-10-CM

## 2016-01-27 DIAGNOSIS — E119 Type 2 diabetes mellitus without complications: Secondary | ICD-10-CM | POA: Diagnosis not present

## 2016-01-27 DIAGNOSIS — C251 Malignant neoplasm of body of pancreas: Secondary | ICD-10-CM | POA: Diagnosis not present

## 2016-01-27 DIAGNOSIS — I10 Essential (primary) hypertension: Secondary | ICD-10-CM | POA: Diagnosis not present

## 2016-01-27 LAB — COMPREHENSIVE METABOLIC PANEL
ALBUMIN: 3.6 g/dL (ref 3.5–5.0)
ALK PHOS: 71 U/L (ref 40–150)
ALT: 14 U/L (ref 0–55)
AST: 20 U/L (ref 5–34)
Anion Gap: 11 mEq/L (ref 3–11)
BILIRUBIN TOTAL: 0.54 mg/dL (ref 0.20–1.20)
BUN: 17.9 mg/dL (ref 7.0–26.0)
CALCIUM: 9.2 mg/dL (ref 8.4–10.4)
CO2: 26 mEq/L (ref 22–29)
Chloride: 102 mEq/L (ref 98–109)
Creatinine: 1 mg/dL (ref 0.7–1.3)
EGFR: 79 mL/min/{1.73_m2} — AB (ref >90)
GLUCOSE: 256 mg/dL — AB (ref 70–140)
POTASSIUM: 3.8 meq/L (ref 3.5–5.1)
Sodium: 139 mEq/L (ref 136–145)
TOTAL PROTEIN: 7.2 g/dL (ref 6.4–8.3)

## 2016-01-27 LAB — CBC WITH DIFFERENTIAL/PLATELET
BASO%: 0.4 % (ref 0.0–2.0)
BASOS ABS: 0 10*3/uL (ref 0.0–0.1)
EOS ABS: 0.2 10*3/uL (ref 0.0–0.5)
EOS%: 3.8 % (ref 0.0–7.0)
HEMATOCRIT: 40.4 % (ref 38.4–49.9)
HEMOGLOBIN: 13.1 g/dL (ref 13.0–17.1)
LYMPH#: 0.9 10*3/uL (ref 0.9–3.3)
LYMPH%: 18.6 % (ref 14.0–49.0)
MCH: 27.7 pg (ref 27.2–33.4)
MCHC: 32.4 g/dL (ref 32.0–36.0)
MCV: 85.4 fL (ref 79.3–98.0)
MONO#: 0.5 10*3/uL (ref 0.1–0.9)
MONO%: 9.6 % (ref 0.0–14.0)
NEUT%: 67.6 % (ref 39.0–75.0)
NEUTROS ABS: 3.2 10*3/uL (ref 1.5–6.5)
Platelets: 145 10*3/uL (ref 140–400)
RBC: 4.73 10*6/uL (ref 4.20–5.82)
RDW: 16.2 % — AB (ref 11.0–14.6)
WBC: 4.8 10*3/uL (ref 4.0–10.3)

## 2016-01-27 NOTE — Telephone Encounter (Signed)
Gave patient avs report and appointments for November.  °

## 2016-01-27 NOTE — Progress Notes (Signed)
[facility]  [phone] [phone]  Clinic follow up Note   Patient Care Team: Binnie Rail, MD as PCP - General (Internal Medicine) [name], MD as Consulting Physician (General Surgery) 01/27/2016  CHIEF COMPLAINTS:  Follow-up pancreatic cancer  Oncology History   Primary pancreatic cancer [facility])   Staging form: Pancreas, AJCC 7th Edition     Clinical stage from 09/30/2015: Stage IIB (T2, N1, M0) - Signed by Truitt Merle, MD on 10/07/2015       Malignant neoplasm of body of pancreas ([place])   09/30/2015 Initial Diagnosis    Primary pancreatic cancer ([place])      09/30/2015 Initial Biopsy     endoscopic pancreatic body mass fine-needle aspiration show malignant cells consistent with adenocarcinoma.      09/30/2015 Procedure     EUS showe a 2.5 cm pancreatic mass causing a stream the dictation of the main pancreatic duct, and directly abutting the SMA an      10/06/2015 Imaging     CT chest, abdomen and  pelvis showed a  4 cm mass in the body of  pancreas, tumor abut the anterior wall of the SMA, a 1.4cm  he pedal duoligament lymph nodes is suspicious, 75mm RUL lung nodule is nonspecific, no other distant mets      10/15/2015 -  Chemotherapy    neoadjuvant chemo gemcitabine 1000mg /m2 and abraxane 120mg /m2, on day 1, 8 every 21 days, held after 11/30/2015 due to radiation      01/12/2016 - 01/21/2016 Radiation Therapy    SBRT to pancreatic cancer, 33 Gy in 5 fractions         HISTORY OF PRESENTING ILLNESS:  [name] 80 y.o. male is here because of His recently diagnosed pancreatic cancer. He presents to the clinic with his wife today.  He has been having abdominal pain for the past one month, worst in the past few weeks, radiates to back, 3-5/10, intermittent, worse with empty stomack, no nausea, bloating, BM is slightly constipated, once a daily. No melana or hematochezia. He has good appetie and eats well, mild fatigue, he is aboe to do his  daily activities without difficulty, such as morning his long. He also exercise regularly, bikes a few miles a day. No significant weight loss recently.  He was seen by his primary care physician, and a CT chest and abdomen was done on 09/20/2015 which showed a 2.7 x 2.5 cm mass in the pancreatic body with associated pancreatic duct dilatation. He was referred to see GI Dr. Ardis Hughs, and underwent EGD and EUS, backward a mass fine-needle biopsy, which showed adenocarcinoma.  He was diagnosed with prosttae can 5 years ago, has been monitored, no treatment, he has low PSA level.  He is diabetic, he is to be on metformin, has been off for a while because his sugar has been relatively controlled with diet alone. His recent HbA1c was some 7.9 in 05/2015.   CURRENT THERAPY: observation   INTERIM HISTORY: [name] came in for follow up. He completed 5 fractions of S/P RT last week, he overall tolerated very well, without much noticeable side effects. He feels well overall, denies any significant pain, appetite and energy level has much improved since he came off chemotherapy. No other complaints.   MEDICAL HISTORY:  Past Medical History:  Diagnosis Date  . Arthritis    R hip and shoulder  . Complication of anesthesia    Local anesthetic used in office for Prostate biopsy"  reaction Tachycardia,nausea, hallucinations, Blood pressure elevated"- No problems once done at hospital with anesthesia" Thinks Ciprofloxacin was the injection"   . Diabetes mellitus   . Enlarged prostate   . Femoral bruit    R femoral  . GERD with stricture    PMH of  . Hyperlipidemia   . Hypertension   . Macular degeneration    glaucoma suspect  . Pancreatic cancer ([place]) 09/2015   adenocarcinoma of pancreas  . PONV (postoperative nausea and vomiting)   . Prostate cancer [facility])    prostate ; Dr Wyvonnia Lora to be monitored x 5 yrs now.  . Squamous papilloma    of esophagus    SURGICAL HISTORY: Past Surgical  History:  Procedure Laterality Date  . ANKLE FUSION  2004  . COLONOSCOPY  05/2012  . esophageal dilation  2002   Dr  Lucio Edward  . ESOPHAGOGASTRODUODENOSCOPY (EGD) WITH PROPOFOL N/A 09/30/2015   Procedure: ESOPHAGOGASTRODUODENOSCOPY (EGD) WITH PROPOFOL;  Surgeon: Milus Banister, MD;  Location: [facility];  Service: Endoscopy;  Laterality: N/A;  . EUS N/A 09/30/2015   Procedure: UPPER ENDOSCOPIC ULTRASOUND (EUS) RADIAL;  Surgeon: Milus Banister, MD;  Location: [facility] ENDOSCOPY;  Service: Endoscopy;  Laterality: N/A;  . FINE NEEDLE ASPIRATION N/A 09/30/2015   Procedure: FINE NEEDLE ASPIRATION (FNA) LINEAR;  Surgeon: Milus Banister, MD;  Location: [facility] ENDOSCOPY;  Service: Endoscopy;  Laterality: N/A;  . HERNIA REPAIR     bil inguinal hernias  . PARTIAL COLECTOMY     perforated diverticulitis  . PORTACATH PLACEMENT Left 10/13/2015   Procedure: INSERTION PORT-A-CATH;  Surgeon: [name], MD;  Location: [facility];  Service: General;  Laterality: Left;  . PROSTATE BIOPSY  03/29/2012   Procedure: BIOPSY TRANSRECTAL ULTRASONIC PROSTATE (TUBP);  Surgeon: Fredricka Bonine, MD;  Location: [facility];  Service: Urology;  Laterality: N/A;  . ROTATOR CUFF REPAIR  2006  . TOTAL HIP ARTHROPLASTY Left 2012    SOCIAL HISTORY: Social History   Social History  . Marital status: Married    Spouse name: Deloris  . Number of children: 1  . Years of education: N/A   Occupational History  . Retired     Ecologist in [place]  . Smoking status: Former Smoker    Packs/day: 0.50    Years: 40.00    Types: Cigarettes    Quit date: 03/27/1989  . Smokeless tobacco: Never Used     Comment: smoked [place], up to 1 ppd  . Alcohol use No  . Drug use: No  . Sexual activity: Not on file   Other Topics Concern  . 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 He and his wife moved to [place] to be closer to his son.  He has one son and one Granddaughter who is 43.  FAMILY HISTORY: Family History  Problem Relation Age of Onset  . Kidney disease Father     ? Rheumatic fever as child  . Hypertension Father   . Heart attack Paternal Uncle     in 72s  . COPD Paternal Uncle     due to exposures in  WW 2  . Kidney disease Paternal Aunt     ? Rheumatic fever as child  . Cancer Neg Hx   . Diabetes Neg Hx   . Stroke Neg Hx   . Colon cancer Neg Hx     ALLERGIES:  is allergic to ace inhibitors; angiotensin receptor blockers; and ciprofloxacin.  MEDICATIONS:  Current Outpatient Prescriptions  Medication Sig Dispense Refill  . amLODipine (NORVASC) 10 MG tablet Take 1 tablet (10 mg total) by mouth daily. 90 tablet 3  . aspirin 81 MG tablet Take 1 tablet (81 mg total) by mouth daily. 30 tablet   . doxazosin (CARDURA) 1 MG tablet TAKE 1 TABLET BY MOUTH EVERY DAY 90 tablet 2  . glucose blood (ONETOUCH VERIO) test strip Check blood sugar once daily 100 each 12  . hydrochlorothiazide (MICROZIDE) 12.5 MG capsule Take 1 capsule (12.5 mg total) by mouth daily. 90 capsule 3  . latanoprost (XALATAN) 0.005 % ophthalmic solution Place 1 drop into both eyes at bedtime.  1  . lidocaine-prilocaine (EMLA) cream Apply 1 application topically as needed. Apply to pac site 1 hour prior to stick and cover with plastic wrap 30 g 11  . metFORMIN (GLUCOPHAGE) 500 MG tablet Take 1 tablet (500 mg total) by mouth 2 (two) times daily with a meal. 180 tablet 3  . multivitamin-lutein (OCUVITE-LUTEIN) CAPS Take 1 capsule by mouth daily.    Glory Rosebush DELICA LANCETS 99991111 MISC Use to check blood sugars once a day Dx E11.9 100 each 3  . ONETOUCH VERIO test strip CHECK BLOOD SUGAR ONCE DAILY. DX250.00 100 each 5  .  pravastatin (PRAVACHOL) 20 MG tablet Take 1 tablet (20 mg total) by mouth at bedtime. 90 tablet 3   No current facility-administered medications for this visit.     REVIEW OF SYSTEMS:   Constitutional: Denies fevers, chills or abnormal night sweats Eyes: Denies blurriness of vision, double vision or watery eyes Ears, nose, mouth, throat, and face: Denies mucositis or sore throat Respiratory: Denies cough, dyspnea or wheezes Cardiovascular: Denies palpitation, chest discomfort or lower extremity swelling Gastrointestinal:  Denies nausea, heartburn or change in bowel habits, (+) epigastric pain Skin: Denies abnormal skin rashes Lymphatics: Denies new lymphadenopathy or easy bruising Neurological:Denies numbness, tingling or new weaknesses Behavioral/Psych: Mood is stable, no new changes  All other systems were reviewed with the patient and are negative.  PHYSICAL EXAMINATION: ECOG PERFORMANCE STATUS: 0  Vitals:   01/27/16 0936  BP: (!) 154/67  Pulse: 84  Resp: 17  Temp: 98.4 F (36.9 C)   Filed Weights   01/27/16 0936  Weight: 130 lb 9.6 oz (59.2 kg)    GENERAL:alert, no distress and comfortable SKIN: skin color, texture, turgor are normal, no rashes or significant lesions EYES: normal, conjunctiva are pink and non-injected, sclera clear OROPHARYNX:no exudate, no erythema and lips, buccal mucosa, and tongue normal  NECK: supple, thyroid normal size, non-tender, without nodularity LYMPH:  no palpable lymphadenopathy in the cervical, axillary or inguinal LUNGS: clear to auscultation and percussion with normal breathing effort HEART: regular rate & rhythm and no murmurs and no lower extremity edema ABDOMEN:abdomen soft, non-tender and normal bowel sounds Musculoskeletal:no cyanosis of digits and no clubbing  PSYCH: alert & oriented x 3 with fluent speech NEURO: no focal motor/sensory deficits EXT: no edema   LABORATORY DATA:  I have reviewed the data as listed CBC Latest  Ref Rng & Units 01/27/2016 12/10/2015 11/26/2015  WBC 4.0 - 10.3 10e3/uL 4.8 12.5(H) 3.4(L)  Hemoglobin 13.0 - 17.1 g/dL 13.1 11.9(L) 11.8(L)  Hematocrit 38.4 - 49.9 % 40.4 36.6(L) 34.6(L)  Platelets 140 - 400 10e3/uL 145 288 159   CMP Latest Ref Rng & Units 01/27/2016 12/10/2015 11/26/2015  Glucose 70 - 140 mg/dl 256(H) 90 138  BUN 7.0 - 26.0 mg/dL 17.9 21.0 18.8  Creatinine 0.7 - 1.3 mg/dL 1.0 1.0 1.0  Sodium 136 - 145 mEq/L 139 144 141  Potassium 3.5 - 5.1 mEq/L 3.8 3.7 3.7  Chloride 96 - 112 mEq/L - - -  CO2 22 - 29 mEq/L 26 26 27   Calcium 8.4 - 10.4 mg/dL 9.2 9.8 9.5  Total Protein 6.4 - 8.3 g/dL 7.2 7.5 7.3  Total Bilirubin 0.20 - 1.20 mg/dL 0.54 0.38 0.39  Alkaline Phos 40 - 150 U/L 71 89 84  AST 5 - 34 U/L 20 21 22   ALT 0 - 55 U/L 14 18 18    CA19.9 (0-35U/ml) 09/30/2015: 370 10/07/2015: 516 10/29/2015: 381 11/26/2015: 84  PATHOLOGY REPORT Diagnosis 09/30/2015 FINE NEEDLE ASPIRATION, ENDOSCOPIC PANCREATIC BODY (SPECIMEN 1 OF 1 COLLECTED 09/30/2015) MALIGNANT CELLS PRESENT, CONSISTENT WITH ADENOCARCINOMA. SEE COMMENT. COMMENT: THE CASE WAS DISCUSSED WITH DR. Ardis Hughs ON 10/01/2015.  RADIOGRAPHIC STUDIES: I have personally reviewed the radiological images as listed and agreed with the findings in the report. No results found. Upper EUS 09/30/2015 Dr. Ardis Hughs  Endosonographic Finding 1. An irregular mass was identified in the pancreatic body. The mass was hypoechoic. The mass measured 25 mm in maximal cross-sectional diameter. The endosonographic borders were poorlydefined. The mass directly abuts the SMA for about 1.5cm and also abuts the Celiac trunk for a short distance. The remainder of the pancreatic parenchyma was normal. Upstream from the mass, the main pancreatic duct was dilated, a tortuous/ectatic duct and a maximum duct diameter of 5 mm. Fine needle aspiration for cytology was performed. Color Doppler imaging was utilized prior to needle puncture to confirm a lack of significant  vascular structures within the needle path. Three passes were made with the 25 gauge needle using a transgastric approach. Some passes were made with a stylet. A cytotechnologist was present to evaluate the adequacy of the specimen. 2. No peripancreatic adenopathy 3. CBD was normal, non-dilated 4. Gallbladder was normal 5. Limited views of liver, spleen, portal and splenic vessels were all normal.  - Schatzki's ring (vs focal peptic stricture) at the GE junction - 2.5cm mass in the body of pancreas causing upstream dilation of the main pancreatic duct, directly abutting the SMA and celiac trunk. Preliminary cytology review from the mass was positive for malignancy (likely adenocarcinoma)   Bilateral lower extremity venous duplex 12/01/2015  Preliminary report:  Bilateral:  No evidence of DVT, superficial thrombosis, or Baker's Cyst. Mild interstitial fluid noted in the disal right leg and mild to moderate in the left.  CT abdomen and pelvis w contrast 12/07/2015 IMPRESSION: 1. Today's study demonstrates a positive response to therapy as evidenced by regression of hypovascular mass in the body of the pancreas, and regression of the previously noted enlarged hepatoduodenal ligament lymph node. No new sites of metastatic disease are noted elsewhere in the abdomen or pelvis. 2. Aortic atherosclerosis. 3. Colonic diverticulosis without evidence of acute diverticulitis at this time. 4. Prostatomegaly with median lobe hypertrophy. 5. Mild centrilobular emphysema.  ADDENDUM: Upon further review, the superior mesenteric vein is uninvolved by the mass, and is clearly widely patent excepting some collateral flow from multiple splenoportal collateral pathway is bypassing the narrowed splenic vein.   ASSESSMENT &  PLAN: 80 year old African-American male, presented with epigastric pain for a month.  1. Primary pancreatic cancer, adenocarcinoma in the body of pancreas,cT2N1M0, stage IIB,  probable unresectable  -I reviewed his CT scan findings, EUS findings and pancreatic mass biopsy pathology results with pt and his wife in details. -His CT scan showed no evidence of distant metastasis, there is a 14 mm hepatoduodenal node on CT which is suspicious for nodal metastasis, EUS was negative for peripancreatic adenopathy.  -he has completed 2 cycles of Abraxane and gemcitabine, tolerated very well. -We reviewed his restaging CT scan etiology at tumor board a few days ago, he had a good partial response, but his tumor is still abuts some of the surrounding vessels, Dr. Barry Dienes feels it's not resectable for now  -He has now completed SBRT, tolerated very well -He is clinically doing well, without much symptoms. We discussed the option of surgery again, patient does not want to go to other major academic [place] for second opinion about his surgery. -We discussed the option of restart chemotherapy, versus observation for now, and restart chemotherapy at disease progression. He understands his pancreas cancer is probably not curable without surgery. He hopes the SBRT he just received would control his disease for a while, and he would not need chemo in near future.  -since the goal of therapy is likely palliative, I'll observe him for now. -I plan to repeat his restaging scan in 6-8 weeks, will monitor his CA19.9 monthly   2. B/l LE edema and pain -Doppler today was negative for DVT or superficial thrombosis -This is probably related to his prior ankle fusion, hypoalbuminemia  -resolved now   3. DM -He is not on medication, however his recent HbA1c was 7.9 in 05/2015 -His random blood glucose is high today, I recommend him to follow-up with his primary care physician.   4. Prostate cancer, untreated  -His prostate cancer has been monitored by his urologist, treatment was not recommended -will follow up clinically   5. HTN -Continue medication, follow up with  PCP  Plan -continue observation for now  -lab and f/u in 4 weeks   All questions were answered. The patient knows to call the clinic with any problems, questions or concerns. I spent 20 minutes counseling the patient face to face. The total time spent in the appointment was 25 minutes and more than 50% was on counseling.     Truitt Merle, MD 01/27/2016

## 2016-01-28 LAB — CANCER ANTIGEN 19-9: CA 19-9: 13 U/mL (ref 0–35)

## 2016-01-28 NOTE — Progress Notes (Signed)
°  Radiation Oncology         [phone] ________________________________  Name: [name] MRN: [id number]  Date: 01/21/2016  DOB: 07/08/1935  End of Treatment Note  Diagnosis:   Stage IIB, T2, N1, M0 adenocarcinoma of the pancreatic body     Indication for treatment:  Curative  Radiation treatment dates:  01/12/16 - 01/21/16  Site/dose:  33 Gy in 5 fractions to the pancreas.  Beams/energy:  SBRT/SRT-3D // 10FFF Photon  Narrative: The patient tolerated radiation treatment relatively well. Denies pain, discomfort, nausea,or emesis. He reports more energy completing treatment than before.  Plan: The patient has completed radiation treatment. The patient will return to radiation oncology clinic for routine followup in one month. I advised them to call or return sooner if they have any questions or concerns related to their recovery or treatment.  ------------------------------------------------  Jodelle Gross, MD, PhD  This document serves as a record of services personally performed by Kyung Rudd, MD. It was created on his behalf by Darcus Austin, a trained medical scribe. The creation of this record is based on the scribe's personal observations and the provider's statements to them. This document has been checked and approved by the attending provider.

## 2016-02-01 ENCOUNTER — Other Ambulatory Visit: Payer: Self-pay | Admitting: Internal Medicine

## 2016-02-05 ENCOUNTER — Other Ambulatory Visit: Payer: Self-pay | Admitting: Internal Medicine

## 2016-02-15 DIAGNOSIS — H04123 Dry eye syndrome of bilateral lacrimal glands: Secondary | ICD-10-CM | POA: Diagnosis not present

## 2016-02-15 DIAGNOSIS — E119 Type 2 diabetes mellitus without complications: Secondary | ICD-10-CM | POA: Diagnosis not present

## 2016-02-15 DIAGNOSIS — H25813 Combined forms of age-related cataract, bilateral: Secondary | ICD-10-CM | POA: Diagnosis not present

## 2016-02-15 DIAGNOSIS — H40023 Open angle with borderline findings, high risk, bilateral: Secondary | ICD-10-CM | POA: Diagnosis not present

## 2016-02-23 NOTE — Progress Notes (Signed)
[name] is here for a one month follow up appointment for S/P SBRT to abdomen adenocarcinoma of the pancreas body.  Pain: None Fatigue:Able to do regular routine without any problems. Nausea/VomitingNone. Appetite is good. Wt Readings from Last 3 Encounters:  02/24/16 133 lb 6.4 oz (60.5 kg)  01/27/16 130 lb 9.6 oz (59.2 kg)  01/21/16 130 lb 3.2 oz (59.1 kg)  BP 119/70   Pulse 88   Temp 98 F (36.7 C) (Oral)   Resp 18   Ht 5\' 10"  (1.778 m)   Wt 133 lb 6.4 oz (60.5 kg)   SpO2 98%   BMI 19.14 kg/m

## 2016-02-24 ENCOUNTER — Encounter: Payer: Self-pay | Admitting: Radiation Oncology

## 2016-02-24 ENCOUNTER — Ambulatory Visit
Admission: RE | Admit: 2016-02-24 | Discharge: 2016-02-24 | Disposition: A | Discharge status: Home / Self Care | Payer: Medicare Other | Source: Ambulatory Visit | Attending: Radiation Oncology | Admitting: Radiation Oncology

## 2016-02-24 VITALS — BP 119/70 | HR 88 | Temp 98.0°F | Resp 18 | Ht 70.0 in | Wt 133.4 lb

## 2016-02-24 DIAGNOSIS — C251 Malignant neoplasm of body of pancreas: Secondary | ICD-10-CM | POA: Diagnosis not present

## 2016-02-24 DIAGNOSIS — Z7984 Long term (current) use of oral hypoglycemic drugs: Secondary | ICD-10-CM | POA: Insufficient documentation

## 2016-02-24 DIAGNOSIS — Z7982 Long term (current) use of aspirin: Secondary | ICD-10-CM | POA: Insufficient documentation

## 2016-02-24 NOTE — Progress Notes (Signed)
[facility]  Telephone:(336) [phone] Fax:(336) [phone]  Clinic follow up Note   Patient Care Team: Binnie Rail, MD as PCP - General (Internal Medicine) [name], MD as Consulting Physician (General Surgery) 02/25/2016  CHIEF COMPLAINTS:  Follow-up pancreatic cancer  Oncology History   Primary pancreatic cancer [facility])   Staging form: Pancreas, AJCC 7th Edition     Clinical stage from 09/30/2015: Stage IIB (T2, N1, M0) - Signed by Truitt Merle, MD on 10/07/2015       Malignant neoplasm of body of pancreas ([place])   09/30/2015 Initial Diagnosis    Primary pancreatic cancer ([place])      09/30/2015 Initial Biopsy     endoscopic pancreatic body mass fine-needle aspiration show malignant cells consistent with adenocarcinoma.      09/30/2015 Procedure     EUS showe a 2.5 cm pancreatic mass causing a stream the dictation of the main pancreatic duct, and directly abutting the SMA an      10/06/2015 Imaging     CT chest, abdomen and  pelvis showed a  4 cm mass in the body of  pancreas, tumor abut the anterior wall of the SMA, a 1.4cm  he pedal duoligament lymph nodes is suspicious, 11mm RUL lung nodule is nonspecific, no other distant mets      10/15/2015 -  Chemotherapy    neoadjuvant chemo gemcitabine 1000mg /m2 and abraxane 120mg /m2, on day 1, 8 every 21 days, held after 11/30/2015 due to radiation      01/12/2016 - 01/21/2016 Radiation Therapy    SBRT to pancreatic cancer, 33 Gy in 5 fractions         HISTORY OF PRESENTING ILLNESS:  [name] 80 y.o. male is here because of His recently diagnosed pancreatic cancer. He presents to the clinic with his wife today.  He has been having abdominal pain for the past one month, worst in the past few weeks, radiates to back, 3-5/10, intermittent, worse with empty stomack, no nausea, bloating, BM is slightly constipated, once a daily. No melana or hematochezia. He has good appetie and eats well, mild fatigue, he is aboe to do his  daily activities without difficulty, such as morning his long. He also exercise regularly, bikes a few miles a day. No significant weight loss recently.  He was seen by his primary care physician, and a CT chest and abdomen was done on 09/20/2015 which showed a 2.7 x 2.5 cm mass in the pancreatic body with associated pancreatic duct dilatation. He was referred to see GI Dr. Ardis Hughs, and underwent EGD and EUS, backward a mass fine-needle biopsy, which showed adenocarcinoma.  He was diagnosed with prosttae can 5 years ago, has been monitored, no treatment, he has low PSA level.  He is diabetic, he is to be on metformin, has been off for a while because his sugar has been relatively controlled with diet alone. His recent HbA1c was some 7.9 in 05/2015.   CURRENT THERAPY: observation   INTERIM HISTORY: [name] came in for follow up. He is feeling well. He has been eating well and feeling like he did prior to chemotherapy. He denies abdominal pain. He has been active. He takes Metformin twice daily and checks his sugar once a day.   MEDICAL HISTORY:  Past Medical History:  Diagnosis Date  . Arthritis    R hip and shoulder  . Complication of anesthesia    Local anesthetic used in office for Prostate biopsy" reaction Tachycardia,nausea, hallucinations, Blood pressure  elevated"- No problems once done at hospital with anesthesia" Thinks Ciprofloxacin was the injection"   . Diabetes mellitus   . Enlarged prostate   . Femoral bruit    R femoral  . GERD with stricture    PMH of  . Hyperlipidemia   . Hypertension   . Macular degeneration    glaucoma suspect  . Pancreatic cancer ([place]) 09/2015   adenocarcinoma of pancreas  . PONV (postoperative nausea and vomiting)   . Prostate cancer [facility])    prostate ; Dr Wyvonnia Lora to be monitored x 5 yrs now.  . Squamous papilloma    of esophagus    SURGICAL HISTORY: Past Surgical History:  Procedure Laterality Date  . ANKLE FUSION  2004  .  COLONOSCOPY  05/2012  . esophageal dilation  2002   Dr  Lucio Edward  . ESOPHAGOGASTRODUODENOSCOPY (EGD) WITH PROPOFOL N/A 09/30/2015   Procedure: ESOPHAGOGASTRODUODENOSCOPY (EGD) WITH PROPOFOL;  Surgeon: Milus Banister, MD;  Location: [facility];  Service: Endoscopy;  Laterality: N/A;  . EUS N/A 09/30/2015   Procedure: UPPER ENDOSCOPIC ULTRASOUND (EUS) RADIAL;  Surgeon: Milus Banister, MD;  Location: [facility] END[facility];  Service: Endoscopy;  Laterality: N/A;  . FINE NEEDLE ASPIRATION N/A 09/30/2015   Procedure: FINE NEEDLE ASPIRATION (FNA) LINEAR;  Surgeon: Milus Banister, MD;  Location: [facility] END[facility];  Service: Endoscopy;  Laterality: N/A;  . HERNIA REPAIR     bil inguinal hernias  . PARTIAL COLECTOMY     perforated diverticulitis  . PORTACATH PLACEMENT Left 10/13/2015   Procedure: INSERTION PORT-A-CATH;  Surgeon: [name], MD;  Location: [facility];  Service: General;  Laterality: Left;  . PROSTATE BIOPSY  03/29/2012   Procedure: BIOPSY TRANSRECTAL ULTRASONIC PROSTATE (TUBP);  Surgeon: Fredricka Bonine, MD;  Location: [facility];  Service: Urology;  Laterality: N/A;  . ROTATOR CUFF REPAIR  2006  . TOTAL HIP ARTHROPLASTY Left 2012    SOCIAL HISTORY: Social History   Social History  . Marital status: Married    Spouse name: [name]  . Number of children: 1  . Years of education: N/A   Occupational History  . Retired     Ecologist in [place]  . Smoking status: Former Smoker    Packs/day: 0.50    Years: 40.00    Types: Cigarettes    Quit date: 03/27/1989  . Smokeless tobacco: Never Used     Comment: smoked [place], up to 1 ppd  . Alcohol use No  . Drug use: No  . Sexual activity: Not on file   Other Topics Concern  . 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 He and his wife moved to [place] to be closer to his son.  He has one son and one Granddaughter who is 62.  FAMILY HISTORY: Family History  Problem Relation Age of Onset  . Kidney disease Father     ? Rheumatic fever as child  . Hypertension Father   . Heart attack Paternal Uncle     in 76s  . COPD Paternal Uncle     due to exposures in [facility] 2  . Kidney  disease Paternal Aunt     ? Rheumatic fever as child  . Cancer Neg Hx   . Diabetes Neg Hx   . Stroke Neg Hx   . Colon cancer Neg Hx     ALLERGIES:  is allergic to ace inhibitors; angiotensin receptor blockers; and ciprofloxacin.  MEDICATIONS:  Current Outpatient Prescriptions  Medication Sig Dispense Refill  . amLODipine (NORVASC) 10 MG tablet Take 1 tablet (10 mg total) by mouth daily. 90 tablet 3  . aspirin 81 MG tablet Take 1 tablet (81 mg total) by mouth daily. 30 tablet   . doxazosin (CARDURA) 1 MG tablet TAKE 1 TABLET BY MOUTH EVERY DAY 90 tablet 2  . hydrochlorothiazide (MICROZIDE) 12.5 MG capsule Take 1 capsule (12.5 mg total) by mouth daily. 90 capsule 3  . latanoprost (XALATAN) 0.005 % ophthalmic solution Place 1 drop into both eyes at bedtime.  1  . lidocaine-prilocaine (EMLA) cream Apply 1 application topically as needed. Apply to pac site 1 hour prior to stick and cover with plastic wrap (Patient not taking: Reported on 02/24/2016) 30 g 11  . metFORMIN (GLUCOPHAGE) 500 MG tablet Take 1 tablet (500 mg total) by mouth 2 (two) times daily with a meal. 180 tablet 3  . multivitamin-lutein (OCUVITE-LUTEIN) CAPS Take 1 capsule by mouth daily.    Glory Rosebush DELICA LANCETS 99991111 MISC Use to check blood sugars once a day Dx E11.9 100 each 3  . ONETOUCH VERIO test strip CHECK BLOOD SUGAR ONCE DAILY. DX250.00 100 each 5  . ONETOUCH VERIO test strip CHECK BLOOD SUGAR ONCE DAILY 100 each 3  . pravastatin (PRAVACHOL) 20 MG tablet Take 1  tablet (20 mg total) by mouth at bedtime. 90 tablet 3   No current facility-administered medications for this visit.    Facility-Administered Medications Ordered in Other Visits  Medication Dose Route Frequency Provider Last Rate Last Dose  . sodium chloride flush (NS) 0.9 % injection 10 mL  10 mL Intravenous PRN Truitt Merle, MD   10 mL at 02/25/16 1041    REVIEW OF SYSTEMS:   Constitutional: Denies fevers, chills or abnormal night sweats Eyes: Denies blurriness of vision, double vision or watery eyes Ears, nose, mouth, throat, and face: Denies mucositis or sore throat Respiratory: Denies cough, dyspnea or wheezes Cardiovascular: Denies palpitation, chest discomfort or lower extremity swelling Gastrointestinal:  Denies nausea, heartburn or change in bowel habits Skin: Denies abnormal skin rashes Lymphatics: Denies new lymphadenopathy or easy bruising Neurological:Denies numbness, tingling or new weaknesses Behavioral/Psych: Mood is stable, no new changes  All other systems were reviewed with the patient and are negative.  PHYSICAL EXAMINATION: ECOG PERFORMANCE STATUS: 0  Vitals:   02/25/16 1012  BP: (!) 154/62  Pulse: 85  Resp: 18  Temp: 98.1 F (36.7 C)   Filed Weights   02/25/16 1012  Weight: 132 lb 11.2 oz (60.2 kg)    GENERAL:alert, no distress and comfortable SKIN: skin color, texture, turgor are normal, no rashes or significant lesions EYES: normal, conjunctiva are pink and non-injected, sclera clear OROPHARYNX:no exudate, no erythema and lips, buccal mucosa, and tongue normal  NECK: supple, thyroid normal size, non-tender, without nodularity LYMPH:  no palpable lymphadenopathy in the cervical, axillary or inguinal LUNGS: clear to auscultation and percussion with normal breathing effort HEART: regular rate & rhythm and no murmurs and no lower extremity edema ABDOMEN:abdomen soft, non-tender and normal bowel sounds Musculoskeletal:no cyanosis of digits and no clubbing   PSYCH: alert & oriented x 3 with  fluent speech NEURO: no focal motor/sensory deficits EXT: no edema   LABORATORY DATA:  I have reviewed the data as listed CBC Latest Ref Rng & Units 02/25/2016 01/27/2016 12/10/2015  WBC 4.0 - 10.3 10e3/uL 6.5 4.8 12.5(H)  Hemoglobin 13.0 - 17.1 g/dL 13.8 13.1 11.9(L)  Hematocrit 38.4 - 49.9 % 42.2 40.4 36.6(L)  Platelets 140 - 400 10e3/uL 166 145 288   CMP Latest Ref Rng & Units 02/25/2016 01/27/2016 12/10/2015  Glucose 70 - 140 mg/dl 247(H) 256(H) 90  BUN 7.0 - 26.0 mg/dL 20.6 17.9 21.0  Creatinine 0.7 - 1.3 mg/dL 1.1 1.0 1.0  Sodium 136 - 145 mEq/L 141 139 144  Potassium 3.5 - 5.1 mEq/L 3.6 3.8 3.7  Chloride 96 - 112 mEq/L - - -  CO2 22 - 29 mEq/L 26 26 26   Calcium 8.4 - 10.4 mg/dL 9.2 9.2 9.8  Total Protein 6.4 - 8.3 g/dL 7.3 7.2 7.5  Total Bilirubin 0.20 - 1.20 mg/dL 0.42 0.54 0.38  Alkaline Phos 40 - 150 U/L 78 71 89  AST 5 - 34 U/L 23 20 21   ALT 0 - 55 U/L 20 14 18    CA19.9 (0-35U/ml) 09/30/2015: 370 10/07/2015: 516 10/29/2015: 381 11/26/2015: 84 01/27/2016: 13  PATHOLOGY REPORT Diagnosis 09/30/2015 FINE NEEDLE ASPIRATION, ENDOSCOPIC PANCREATIC BODY (SPECIMEN 1 OF 1 COLLECTED 09/30/2015) MALIGNANT CELLS PRESENT, CONSISTENT WITH ADENOCARCINOMA. SEE COMMENT. COMMENT: THE CASE WAS DISCUSSED WITH DR. Ardis Hughs ON 10/01/2015.  RADIOGRAPHIC STUDIES: I have personally reviewed the radiological images as listed and agreed with the findings in the report. No results found. Upper EUS 09/30/2015 Dr. Ardis Hughs  Endosonographic Finding 1. An irregular mass was identified in the pancreatic body. The mass was hypoechoic. The mass measured 25 mm in maximal cross-sectional diameter. The endosonographic borders were poorlydefined. The mass directly abuts the SMA for about 1.5cm and also abuts the Celiac trunk for a short distance. The remainder of the pancreatic parenchyma was normal. Upstream from the mass, the main pancreatic duct was dilated, a tortuous/ectatic duct  and a maximum duct diameter of 5 mm. Fine needle aspiration for cytology was performed. Color Doppler imaging was utilized prior to needle puncture to confirm a lack of significant vascular structures within the needle path. Three passes were made with the 25 gauge needle using a transgastric approach. Some passes were made with a stylet. A cytotechnologist was present to evaluate the adequacy of the specimen. 2. No peripancreatic adenopathy 3. CBD was normal, non-dilated 4. Gallbladder was normal 5. Limited views of liver, spleen, portal and splenic vessels were all normal.  - Schatzki's ring (vs focal peptic stricture) at the GE junction - 2.5cm mass in the body of pancreas causing upstream dilation of the main pancreatic duct, directly abutting the SMA and celiac trunk. Preliminary cytology review from the mass was positive for malignancy (likely adenocarcinoma)   Bilateral lower extremity venous duplex 12/01/2015  Preliminary report:  Bilateral:  No evidence of DVT, superficial thrombosis, or Baker's Cyst. Mild interstitial fluid noted in the disal right leg and mild to moderate in the left.  CT abdomen and pelvis w contrast 12/07/2015 IMPRESSION: 1. Today's study demonstrates a positive response to therapy as evidenced by regression of hypovascular mass in the body of the pancreas, and regression of the previously noted enlarged hepatoduodenal ligament lymph node. No new sites of metastatic disease are noted elsewhere in the abdomen or pelvis. 2. Aortic atherosclerosis. 3. Colonic diverticulosis without evidence of acute diverticulitis at this time. 4. Prostatomegaly with median  lobe hypertrophy. 5. Mild centrilobular emphysema.  ADDENDUM: Upon further review, the superior mesenteric vein is uninvolved by the mass, and is clearly widely patent excepting some collateral flow from multiple splenoportal collateral pathway is bypassing the narrowed splenic vein.   ASSESSMENT  & PLAN: 80 year old African-American male, presented with epigastric pain for a month.  1. Primary pancreatic cancer, adenocarcinoma in the body of pancreas,cT2N1M0, stage IIB, probable unresectable  -I previously reviewed his CT scan findings, EUS findings and pancreatic mass biopsy pathology results with pt and his wife in details. -His CT scan showed no evidence of distant metastasis, there is a 14 mm hepatoduodenal node on CT which is suspicious for nodal metastasis, EUS was negative for peripancreatic adenopathy.  -he has completed 2 cycles of Abraxane and gemcitabine, tolerated very well. -We previously reviewed his restaging CT scan etiology at tumor board a few days ago, he had a good partial response, but his tumor is still abuts some of the surrounding vessels, Dr. Barry Dienes feels it's not resectable for now  -He has now completed SBRT, tolerated very well -He is clinically doing well, without much symptoms. We discussed the option of surgery again, patient does not want to go to other major academic [facility] for second opinion about his surgery. -We previously discussed the option of restart chemotherapy, versus observation for now, and restart chemotherapy at disease progression. He understands his pancreas cancer is probably not curable without surgery. He hopes the SBRT he just received would control his disease for a while, and he would not need chemo in near future.  -since the goal of therapy is likely palliative, I'll observe him for now, he is willing to take chemotherapy if he has disease progression -Will monitor his CA19.9 monthly  -Last restaging scan was 11/2015. Will repeat staging scan before our next visit in 5 weeks  -Today's CA 19.9 pending  2. DM -His recent HbA1c was 7.2 in 11/2015 -He takes Metformin twice daily and checks his sugar once daily -His blood glucose is high today at 247, I recommend him to follow-up with his primary care physician.   3. Prostate  cancer, untreated  -His prostate cancer has been monitored by his urologist, treatment was not recommended -will follow up clinically   4. HTN -Continue medication, follow up with PCP  Plan -continue observation for now  -he will need port flushed toay -he does not need any refills at this time -he will undergo restaging scan before our next visit -lab and f/u in 5 weeks   All questions were answered. The patient knows to call the clinic with any problems, questions or concerns. I spent 20 minutes counseling the patient face to face. The total time spent in the appointment was 25 minutes and more than 50% was on counseling.  This document serves as a record of services personally performed by Truitt Merle, MD. It was created on her behalf by Arlyce Harman, a trained medical scribe. The creation of this record is based on the scribe's personal observations and the provider's statements to them. This document has been checked and approved by the attending provider.     Truitt Merle, MD 02/25/2016

## 2016-02-24 NOTE — Progress Notes (Signed)
Radiation Oncology         [phone] ________________________________  Name: [name] MRN: [id number]  Date: 02/24/2016  DOB: 1935-04-03  Post Treatment Note  CC: Binnie Rail, MD  Binnie Rail, MD  Diagnosis:  Stage IIB, T2, N1, M0 adenocarcinoma of the pancreatic body.   Interval Since Last Radiation: 4 weeks   01/12/16 - 01/21/16 SBRT Treatment: 33 Gy in 5 fractions to the pancreas.   Narrative:  The patient returns today for routine follow-up. The patient did very well in terms of tolerating his radiotherapy treatment. He did not experience any significant difficulty with nausea or vomiting. He denies any chest pain or shortness of breath. He states that he is gain several pounds back since his last visit. He denies any abdominal pain, or skin discoloration. No other complaints or verbalized.             ALLERGIES:  is allergic to ace inhibitors; angiotensin receptor blockers; and ciprofloxacin.  Meds: Current Outpatient Prescriptions  Medication Sig Dispense Refill  . amLODipine (NORVASC) 10 MG tablet Take 1 tablet (10 mg total) by mouth daily. 90 tablet 3  . aspirin 81 MG tablet Take 1 tablet (81 mg total) by mouth daily. 30 tablet   . doxazosin (CARDURA) 1 MG tablet TAKE 1 TABLET BY MOUTH EVERY DAY 90 tablet 2  . hydrochlorothiazide (MICROZIDE) 12.5 MG capsule Take 1 capsule (12.5 mg total) by mouth daily. 90 capsule 3  . latanoprost (XALATAN) 0.005 % ophthalmic solution Place 1 drop into both eyes at bedtime.  1  . metFORMIN (GLUCOPHAGE) 500 MG tablet Take 1 tablet (500 mg total) by mouth 2 (two) times daily with a meal. 180 tablet 3  . multivitamin-lutein (OCUVITE-LUTEIN) CAPS Take 1 capsule by mouth daily.    Glory Rosebush DELICA LANCETS 99991111 MISC Use to check blood sugars once a day Dx E11.9 100 each 3  . ONETOUCH VERIO test strip CHECK BLOOD SUGAR ONCE DAILY. DX250.00 100 each 5  . ONETOUCH VERIO test strip CHECK BLOOD SUGAR ONCE DAILY 100 each 3  .  pravastatin (PRAVACHOL) 20 MG tablet Take 1 tablet (20 mg total) by mouth at bedtime. 90 tablet 3  . lidocaine-prilocaine (EMLA) cream Apply 1 application topically as needed. Apply to pac site 1 hour prior to stick and cover with plastic wrap (Patient not taking: Reported on 02/24/2016) 30 g 11   No current facility-administered medications for this encounter.     Physical Findings:  height is 5\' 10"  (1.778 m) and weight is 133 lb 6.4 oz (60.5 kg). His oral temperature is 98 F (36.7 C). His blood pressure is 119/70 and his pulse is 88. His respiration is 18 and oxygen saturation is 98%.  In general this is a well appearing African American male in no acute distress. He's alert and oriented x4 and appropriate throughout the examination. Cardiopulmonary assessment is negative for acute distress and he exhibits normal effort.   Lab Findings: Lab Results  Component Value Date   WBC 4.8 01/27/2016   HGB 13.1 01/27/2016   HCT 40.4 01/27/2016   MCV 85.4 01/27/2016   PLT 145 01/27/2016     Radiographic Findings: No results found.  Impression/Plan: 1. Stage IIB, T2, N1, M0 adenocarcinoma of the pancreatic body. The patient appears to be doing well overall. He will follow up with Dr. Burr Medico next week and have a CT scan in the next few weeks. We will plan to see him back in  6 month's time and he will keep [place] informed of any questions or concerns prior to that visit.       Carola Rhine, PAC

## 2016-02-25 ENCOUNTER — Telehealth: Payer: Self-pay | Admitting: *Deleted

## 2016-02-25 ENCOUNTER — Other Ambulatory Visit (HOSPITAL_BASED_OUTPATIENT_CLINIC_OR_DEPARTMENT_OTHER): Payer: Medicare Other

## 2016-02-25 ENCOUNTER — Encounter: Payer: Self-pay | Admitting: Hematology

## 2016-02-25 ENCOUNTER — Telehealth: Payer: Self-pay | Admitting: Hematology

## 2016-02-25 ENCOUNTER — Ambulatory Visit (HOSPITAL_BASED_OUTPATIENT_CLINIC_OR_DEPARTMENT_OTHER): Payer: Medicare Other | Admitting: Hematology

## 2016-02-25 ENCOUNTER — Other Ambulatory Visit: Payer: Self-pay | Admitting: *Deleted

## 2016-02-25 VITALS — BP 154/62 | HR 85 | Temp 98.1°F | Resp 18 | Ht 70.0 in | Wt 132.7 lb

## 2016-02-25 DIAGNOSIS — Z95828 Presence of other vascular implants and grafts: Secondary | ICD-10-CM | POA: Diagnosis not present

## 2016-02-25 DIAGNOSIS — I10 Essential (primary) hypertension: Secondary | ICD-10-CM

## 2016-02-25 DIAGNOSIS — E119 Type 2 diabetes mellitus without complications: Secondary | ICD-10-CM

## 2016-02-25 DIAGNOSIS — C251 Malignant neoplasm of body of pancreas: Secondary | ICD-10-CM

## 2016-02-25 DIAGNOSIS — C259 Malignant neoplasm of pancreas, unspecified: Secondary | ICD-10-CM | POA: Diagnosis not present

## 2016-02-25 DIAGNOSIS — C61 Malignant neoplasm of prostate: Secondary | ICD-10-CM | POA: Diagnosis not present

## 2016-02-25 LAB — COMPREHENSIVE METABOLIC PANEL
ALT: 20 U/L (ref 0–55)
AST: 23 U/L (ref 5–34)
Albumin: 3.6 g/dL (ref 3.5–5.0)
Alkaline Phosphatase: 78 U/L (ref 40–150)
Anion Gap: 12 mEq/L — ABNORMAL HIGH (ref 3–11)
BUN: 20.6 mg/dL (ref 7.0–26.0)
CHLORIDE: 103 meq/L (ref 98–109)
CO2: 26 mEq/L (ref 22–29)
Calcium: 9.2 mg/dL (ref 8.4–10.4)
Creatinine: 1.1 mg/dL (ref 0.7–1.3)
EGFR: 77 mL/min/{1.73_m2} — ABNORMAL LOW (ref >90)
GLUCOSE: 247 mg/dL — AB (ref 70–140)
POTASSIUM: 3.6 meq/L (ref 3.5–5.1)
SODIUM: 141 meq/L (ref 136–145)
Total Bilirubin: 0.42 mg/dL (ref 0.20–1.20)
Total Protein: 7.3 g/dL (ref 6.4–8.3)

## 2016-02-25 LAB — CBC WITH DIFFERENTIAL/PLATELET
BASO%: 0.8 % (ref 0.0–2.0)
BASOS ABS: 0.1 10*3/uL (ref 0.0–0.1)
EOS%: 4.1 % (ref 0.0–7.0)
Eosinophils Absolute: 0.3 10*3/uL (ref 0.0–0.5)
HCT: 42.2 % (ref 38.4–49.9)
HEMOGLOBIN: 13.8 g/dL (ref 13.0–17.1)
LYMPH%: 29.2 % (ref 14.0–49.0)
MCH: 27.5 pg (ref 27.2–33.4)
MCHC: 32.7 g/dL (ref 32.0–36.0)
MCV: 84.3 fL (ref 79.3–98.0)
MONO#: 0.6 10*3/uL (ref 0.1–0.9)
MONO%: 8.9 % (ref 0.0–14.0)
NEUT#: 3.7 10*3/uL (ref 1.5–6.5)
NEUT%: 57 % (ref 39.0–75.0)
Platelets: 166 10*3/uL (ref 140–400)
RBC: 5 10*6/uL (ref 4.20–5.82)
RDW: 14.4 % (ref 11.0–14.6)
WBC: 6.5 10*3/uL (ref 4.0–10.3)
lymph#: 1.9 10*3/uL (ref 0.9–3.3)

## 2016-02-25 MED ORDER — SODIUM CHLORIDE 0.9% FLUSH
10.0000 mL | INTRAVENOUS | Status: DC | PRN
  Administered 2016-02-25: 10 mL via INTRAVENOUS
  Filled 2016-02-25: qty 10

## 2016-02-25 MED ORDER — HEPARIN SOD (PORK) LOCK FLUSH 100 UNIT/ML IV SOLN
500.0000 [IU] | Freq: Once | INTRAVENOUS | Status: AC
  Administered 2016-02-25: 500 [IU] via INTRAVENOUS
  Filled 2016-02-25: qty 5

## 2016-02-25 NOTE — Telephone Encounter (Signed)
Appointments scheduled per 12/1 LOS. Patient given AVS report and calendars with future scheduled appointments. Patient aware of CT scan, orders not placed during scheduling.

## 2016-02-25 NOTE — Telephone Encounter (Signed)
CALLED PATIENT TO INFORM OF FU VISIT WITH ALISON PERKINS ON 08-23-16 @ 2 PM, LVM FOR A RETURN CALL

## 2016-02-26 LAB — CANCER ANTIGEN 19-9: CA 19-9: 22 U/mL (ref 0–35)

## 2016-03-23 ENCOUNTER — Other Ambulatory Visit (HOSPITAL_BASED_OUTPATIENT_CLINIC_OR_DEPARTMENT_OTHER): Payer: Medicare Other

## 2016-03-23 ENCOUNTER — Ambulatory Visit (HOSPITAL_COMMUNITY)
Admission: RE | Admit: 2016-03-23 | Discharge: 2016-03-23 | Disposition: A | Discharge status: Home / Self Care | Payer: Medicare Other | Source: Ambulatory Visit | Attending: Hematology | Admitting: Hematology

## 2016-03-23 DIAGNOSIS — R918 Other nonspecific abnormal finding of lung field: Secondary | ICD-10-CM | POA: Diagnosis not present

## 2016-03-23 DIAGNOSIS — I700 Atherosclerosis of aorta: Secondary | ICD-10-CM | POA: Diagnosis not present

## 2016-03-23 DIAGNOSIS — Z9049 Acquired absence of other specified parts of digestive tract: Secondary | ICD-10-CM | POA: Diagnosis not present

## 2016-03-23 DIAGNOSIS — C251 Malignant neoplasm of body of pancreas: Secondary | ICD-10-CM

## 2016-03-23 DIAGNOSIS — C259 Malignant neoplasm of pancreas, unspecified: Secondary | ICD-10-CM | POA: Diagnosis present

## 2016-03-23 DIAGNOSIS — K769 Liver disease, unspecified: Secondary | ICD-10-CM | POA: Insufficient documentation

## 2016-03-23 DIAGNOSIS — M47814 Spondylosis without myelopathy or radiculopathy, thoracic region: Secondary | ICD-10-CM | POA: Insufficient documentation

## 2016-03-23 DIAGNOSIS — M1611 Unilateral primary osteoarthritis, right hip: Secondary | ICD-10-CM | POA: Insufficient documentation

## 2016-03-23 DIAGNOSIS — J439 Emphysema, unspecified: Secondary | ICD-10-CM | POA: Diagnosis not present

## 2016-03-23 DIAGNOSIS — Z9221 Personal history of antineoplastic chemotherapy: Secondary | ICD-10-CM | POA: Diagnosis not present

## 2016-03-23 DIAGNOSIS — K5730 Diverticulosis of large intestine without perforation or abscess without bleeding: Secondary | ICD-10-CM | POA: Insufficient documentation

## 2016-03-23 DIAGNOSIS — Z08 Encounter for follow-up examination after completed treatment for malignant neoplasm: Secondary | ICD-10-CM | POA: Insufficient documentation

## 2016-03-23 DIAGNOSIS — N281 Cyst of kidney, acquired: Secondary | ICD-10-CM | POA: Diagnosis not present

## 2016-03-23 DIAGNOSIS — Z923 Personal history of irradiation: Secondary | ICD-10-CM | POA: Insufficient documentation

## 2016-03-23 DIAGNOSIS — Z8507 Personal history of malignant neoplasm of pancreas: Secondary | ICD-10-CM | POA: Insufficient documentation

## 2016-03-23 DIAGNOSIS — N400 Enlarged prostate without lower urinary tract symptoms: Secondary | ICD-10-CM | POA: Diagnosis not present

## 2016-03-23 LAB — COMPREHENSIVE METABOLIC PANEL
ALBUMIN: 4 g/dL (ref 3.5–5.0)
ALK PHOS: 90 U/L (ref 40–150)
ALT: 22 U/L (ref 0–55)
AST: 28 U/L (ref 5–34)
Anion Gap: 14 mEq/L — ABNORMAL HIGH (ref 3–11)
BILIRUBIN TOTAL: 0.4 mg/dL (ref 0.20–1.20)
BUN: 12.9 mg/dL (ref 7.0–26.0)
CO2: 27 mEq/L (ref 22–29)
Calcium: 9.5 mg/dL (ref 8.4–10.4)
Chloride: 102 mEq/L (ref 98–109)
Creatinine: 1 mg/dL (ref 0.7–1.3)
EGFR: 83 mL/min/{1.73_m2} — ABNORMAL LOW (ref >90)
GLUCOSE: 126 mg/dL (ref 70–140)
Potassium: 3.3 mEq/L — ABNORMAL LOW (ref 3.5–5.1)
SODIUM: 142 meq/L (ref 136–145)
TOTAL PROTEIN: 8 g/dL (ref 6.4–8.3)

## 2016-03-23 LAB — CBC WITH DIFFERENTIAL/PLATELET
BASO%: 0.8 % (ref 0.0–2.0)
Basophils Absolute: 0.1 10*3/uL (ref 0.0–0.1)
EOS%: 3.5 % (ref 0.0–7.0)
Eosinophils Absolute: 0.2 10*3/uL (ref 0.0–0.5)
HEMATOCRIT: 45.9 % (ref 38.4–49.9)
HGB: 15 g/dL (ref 13.0–17.1)
LYMPH#: 1.4 10*3/uL (ref 0.9–3.3)
LYMPH%: 22.7 % (ref 14.0–49.0)
MCH: 27.4 pg (ref 27.2–33.4)
MCHC: 32.7 g/dL (ref 32.0–36.0)
MCV: 83.7 fL (ref 79.3–98.0)
MONO#: 0.5 10*3/uL (ref 0.1–0.9)
MONO%: 7.3 % (ref 0.0–14.0)
NEUT%: 65.7 % (ref 39.0–75.0)
NEUTROS ABS: 4.1 10*3/uL (ref 1.5–6.5)
Platelets: 189 10*3/uL (ref 140–400)
RBC: 5.48 10*6/uL (ref 4.20–5.82)
RDW: 13.7 % (ref 11.0–14.6)
WBC: 6.3 10*3/uL (ref 4.0–10.3)

## 2016-03-23 MED ORDER — IOPAMIDOL (ISOVUE-300) INJECTION 61%
100.0000 mL | Freq: Once | INTRAVENOUS | Status: DC | PRN
  Administered 2016-03-23: 100 mL via INTRAVENOUS
  Filled 2016-03-23: qty 100

## 2016-03-23 MED ORDER — IOPAMIDOL (ISOVUE-370) INJECTION 76%
INTRAVENOUS | Status: AC
  Filled 2016-03-23: qty 100

## 2016-03-27 NOTE — Progress Notes (Signed)
[facility]  [phone] Fax:([phone]) [phone]  Clinic follow up Note   Patient Care Team: Binnie Rail, MD as PCP - General (Internal Medicine) [name], MD as Consulting Physician (General Surgery) 03/28/2016  CHIEF COMPLAINTS:  Follow-up pancreatic cancer  Oncology History   Primary pancreatic cancer [name])   Staging form: Pancreas, AJCC 7th Edition     Clinical stage from 09/30/2015: Stage IIB (T2, N1, M0) - Signed by Truitt Merle, MD on 10/07/2015       Malignant neoplasm of body of pancreas ([place])   09/30/2015 Initial Diagnosis    Primary pancreatic cancer ([name])      09/30/2015 Initial Biopsy     endoscopic pancreatic body mass fine-needle aspiration show malignant cells consistent with adenocarcinoma.      09/30/2015 Procedure     EUS showe a 2.5 cm pancreatic mass causing a stream the dictation of the main pancreatic duct, and directly abutting the SMA an      10/06/2015 Imaging     CT chest, abdomen and  pelvis showed a  4 cm mass in the body of  pancreas, tumor abut the anterior wall of the SMA, a 1.4cm  he pedal duoligament lymph nodes is suspicious, 35mm RUL lung nodule is nonspecific, no other distant mets      10/15/2015 -  Chemotherapy    neoadjuvant chemo gemcitabine 1000mg /m2 and abraxane 120mg /m2, on day 1, 8 every 21 days, held after 11/30/2015 due to radiation      01/12/2016 - 01/21/2016 Radiation Therapy    SBRT to pancreatic cancer, 33 Gy in 5 fractions         HISTORY OF PRESENTING ILLNESS:  [name] 81 y.o. male is here because of His recently diagnosed pancreatic cancer. He presents to the clinic with his wife today.  He has been having abdominal pain for the past one month, worst in the past few weeks, radiates to back, 3-5/10, intermittent, worse with empty stomack, no nausea, bloating, BM is slightly constipated, once a daily. No melana or hematochezia. He has good appetie and eats well, mild fatigue, he is aboe to do his  daily activities without difficulty, such as morning his long. He also exercise regularly, bikes a few miles a day. No significant weight loss recently.  He was seen by his primary care physician, and a CT chest and abdomen was done on 09/20/2015 which showed a 2.7 x 2.5 cm mass in the pancreatic body with associated pancreatic duct dilatation. He was referred to see GI Dr. Ardis Hughs, and underwent EGD and EUS, backward a mass fine-needle biopsy, which showed adenocarcinoma.  He was diagnosed with prosttae can 5 years ago, has been monitored, no treatment, he has low PSA level.  He is diabetic, he is to be on metformin, has been off for a while because his sugar has been relatively controlled with diet alone. His recent HbA1c was some 7.9 in 05/2015.   CURRENT THERAPY: observation   INTERIM HISTORY: [name] came in for follow up. He is doing very well. He denies any significant pain, nausea, or other discomfort. He has good appetite and energy level, remains to be physically active.  He gained 8 lbs in the past month.  MEDICAL HISTORY:  Past Medical History:  Diagnosis Date  . Arthritis    R hip and shoulder  . Complication of anesthesia    Local anesthetic used in office for Prostate biopsy" reaction Tachycardia,nausea, hallucinations, Blood pressure elevated"- No problems  once done at [facility] with anesthesia" Thinks Ciprofloxacin was the injection"   . Diabetes mellitus   . Enlarged prostate   . Femoral bruit    R femoral  . GERD with stricture    PMH of  . Hyperlipidemia   . Hypertension   . Macular degeneration    glaucoma suspect  . Pancreatic cancer ([place]) 09/2015   adenocarcinoma of pancreas  . PONV (postoperative nausea and vomiting)   . Prostate cancer [name])    prostate ; Dr Wyvonnia Lora to be monitored x 5 yrs now.  . Squamous papilloma    of esophagus    SURGICAL HISTORY: Past Surgical History:  Procedure Laterality Date  . ANKLE FUSION  2004  . COLONOSCOPY   05/2012  . esophageal dilation  2002   Dr  Lucio Edward  . ESOPHAGOGASTRODUODENOSCOPY (EGD) WITH PROPOFOL N/A 09/30/2015   Procedure: ESOPHAGOGASTRODUODENOSCOPY (EGD) WITH PROPOFOL;  Surgeon: Milus Banister, MD;  Location: [facility];  Service: Endoscopy;  Laterality: N/A;  . EUS N/A 09/30/2015   Procedure: UPPER ENDOSCOPIC ULTRASOUND (EUS) RADIAL;  Surgeon: Milus Banister, MD;  Location: [facility];  Service: Endoscopy;  Laterality: N/A;  . FINE NEEDLE ASPIRATION N/A 09/30/2015   Procedure: FINE NEEDLE ASPIRATION (FNA) LINEAR;  Surgeon: Milus Banister, MD;  Location: [facility];  Service: Endoscopy;  Laterality: N/A;  . HERNIA REPAIR     bil inguinal hernias  . PARTIAL COLECTOMY     perforated diverticulitis  . PORTACATH PLACEMENT Left 10/13/2015   Procedure: INSERTION PORT-A-CATH;  Surgeon: [name], MD;  Location: [facility];  Service: General;  Laterality: Left;  . PROSTATE BIOPSY  03/29/2012   Procedure: BIOPSY TRANSRECTAL ULTRASONIC PROSTATE (TUBP);  Surgeon: Fredricka Bonine, MD;  Location: [name];  Service: Urology;  Laterality: N/A;  . ROTATOR CUFF REPAIR  2006  . TOTAL HIP ARTHROPLASTY Left 2012    SOCIAL HISTORY: Social History   Social History  . Marital status: Married    Spouse name: Deloris  . Number of children: 1  . Years of education: N/A   Occupational History  . Retired     Ecologist in [place]  . Smoking status: Former Smoker    Packs/day: 0.50    Years: 40.00    Types: Cigarettes    Quit date: 03/27/1989  . Smokeless tobacco: Never Used     Comment: smoked [place], up to 1 ppd  . Alcohol use No  . Drug use: No  . Sexual activity: Not on file   Other Topics Concern  . 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 He and his wife moved to [name] to be closer to his son.  He has one son and one Granddaughter who is 19.  FAMILY HISTORY: Family History  Problem Relation Age of Onset  . Kidney disease Father     ? Rheumatic fever as child  . Hypertension Father   . Heart attack Paternal Uncle     in 52s  . COPD Paternal Uncle     due to exposures in [facility]  . Kidney disease Paternal Aunt     ?  Rheumatic fever as child  . Cancer Neg Hx   . Diabetes Neg Hx   . Stroke Neg Hx   . Colon cancer Neg Hx     ALLERGIES:  is allergic to ace inhibitors; angiotensin receptor blockers; and ciprofloxacin.  MEDICATIONS:  Current Outpatient Prescriptions  Medication Sig Dispense Refill  . amLODipine (NORVASC) 10 MG tablet Take 1 tablet (10 mg total) by mouth daily. 90 tablet 3  . aspirin 81 MG tablet Take 1 tablet (81 mg total) by mouth daily. 30 tablet   . doxazosin (CARDURA) 1 MG tablet TAKE 1 TABLET BY MOUTH EVERY DAY 90 tablet 2  . hydrochlorothiazide (MICROZIDE) 12.5 MG capsule Take 1 capsule (12.5 mg total) by mouth daily. 90 capsule 3  . latanoprost (XALATAN) 0.005 % ophthalmic solution Place 1 drop into both eyes at bedtime.  1  . lidocaine-prilocaine (EMLA) cream Apply 1 application topically as needed. Apply to pac site 1 hour prior to stick and cover with plastic wrap (Patient not taking: Reported on 02/24/2016) 30 g 11  . metFORMIN (GLUCOPHAGE) 500 MG tablet Take 1 tablet (500 mg total) by mouth 2 (two) times daily with a meal. 180 tablet 3  . multivitamin-lutein (OCUVITE-LUTEIN) CAPS Take 1 capsule by mouth daily.    Glory Rosebush DELICA LANCETS 99991111 MISC Use to check blood sugars once a day Dx E11.9 100 each 3  . ONETOUCH VERIO test strip CHECK BLOOD SUGAR ONCE DAILY. DX250.00 100 each 5  . ONETOUCH VERIO test strip CHECK BLOOD SUGAR ONCE DAILY 100 each 3  . pravastatin (PRAVACHOL) 20 MG tablet Take 1 tablet (20 mg  total) by mouth at bedtime. 90 tablet 3   No current facility-administered medications for this visit.     REVIEW OF SYSTEMS:   Constitutional: Denies fevers, chills or abnormal night sweats Eyes: Denies blurriness of vision, double vision or watery eyes Ears, nose, mouth, throat, and face: Denies mucositis or sore throat Respiratory: Denies cough, dyspnea or wheezes Cardiovascular: Denies palpitation, chest discomfort or lower extremity swelling Gastrointestinal:  Denies nausea, heartburn or change in bowel habits Skin: Denies abnormal skin rashes Lymphatics: Denies new lymphadenopathy or easy bruising Neurological:Denies numbness, tingling or new weaknesses Behavioral/Psych: Mood is stable, no new changes  All other systems were reviewed with the patient and are negative.  PHYSICAL EXAMINATION: ECOG PERFORMANCE STATUS: 0 BP 146/61, weight 140.1 pounds, pulse 93, temperature 90.8, respiratory rate 18, pulse ox 97% on room air GENERAL:alert, no distress and comfortable SKIN: skin color, texture, turgor are normal, no rashes or significant lesions EYES: normal, conjunctiva are pink and non-injected, sclera clear OROPHARYNX:no exudate, no erythema and lips, buccal mucosa, and tongue normal  NECK: supple, thyroid normal size, non-tender, without nodularity LYMPH:  no palpable lymphadenopathy in the cervical, axillary or inguinal LUNGS: clear to auscultation and percussion with normal breathing effort HEART: regular rate & rhythm and no murmurs and no lower extremity edema ABDOMEN:abdomen soft, non-tender and normal bowel sounds Musculoskeletal:no cyanosis of digits and no clubbing  PSYCH: alert & oriented x 3 with fluent speech NEURO: no focal motor/sensory deficits EXT: no edema   LABORATORY DATA:  I have reviewed the data as listed CBC Latest Ref Rng & Units 03/23/2016 02/25/2016 01/27/2016  WBC 4.0 - 10.3 10e3/uL 6.3 6.5 4.8  Hemoglobin 13.0 - 17.1 g/dL 15.0 13.8 13.1    Hematocrit 38.4 - 49.9 % 45.9 42.2 40.4  Platelets 140 - 400 10e3/uL 189 166 145   CMP Latest Ref Rng &  Units 03/23/2016 02/25/2016 01/27/2016  Glucose 70 - 140 mg/dl 126 247(H) 256(H)  BUN 7.0 - 26.0 mg/dL 12.9 20.6 17.9  Creatinine 0.7 - 1.3 mg/dL 1.0 1.1 1.0  Sodium 136 - 145 mEq/L 142 141 139  Potassium 3.5 - 5.1 mEq/L 3.3(L) 3.6 3.8  Chloride 96 - 112 mEq/L - - -  CO2 22 - 29 mEq/L 27 26 26   Calcium 8.4 - 10.4 mg/dL 9.5 9.2 9.2  Total Protein 6.4 - 8.3 g/dL 8.0 7.3 7.2  Total Bilirubin 0.20 - 1.20 mg/dL 0.40 0.42 0.54  Alkaline Phos 40 - 150 U/L 90 78 71  AST 5 - 34 U/L 28 23 20   ALT 0 - 55 U/L 22 20 14    CA19.9 (0-35U/ml) 09/30/2015: 370 10/07/2015: 516 10/29/2015: 381 11/26/2015: 84 01/27/2016: 13 02/25/2016: 22  PATHOLOGY REPORT Diagnosis 09/30/2015 FINE NEEDLE ASPIRATION, ENDOSCOPIC PANCREATIC BODY (SPECIMEN 1 OF 1 COLLECTED 09/30/2015) MALIGNANT CELLS PRESENT, CONSISTENT WITH ADENOCARCINOMA. SEE COMMENT. COMMENT: THE CASE WAS DISCUSSED WITH DR. Ardis Hughs ON 10/01/2015.  RADIOGRAPHIC STUDIES: I have personally reviewed the radiological images as listed and agreed with the findings in the report. Ct Chest W Contrast  Result Date: 03/23/2016 CLINICAL DATA:  81 year old male diagnosed with pancreatic cancer in July 2017 status post radiation therapy completed October 2017 and chemotherapy completed November 2017, presenting for restaging. Additional history of partial colectomy for perforated diverticulitis, bilateral inguinal hernia repair and appendectomy. EXAM: CT CHEST, ABDOMEN, AND PELVIS WITH CONTRAST TECHNIQUE: Multidetector CT imaging of the chest, abdomen and pelvis was performed following the standard protocol during bolus administration of intravenous contrast. CONTRAST:  129mL ISOVUE-300 IOPAMIDOL (ISOVUE-300) INJECTION 61% COMPARISON:  12/07/2015 CT abdomen/ pelvis. 10/06/2015 CT chest and abdomen. FINDINGS: CT CHEST FINDINGS Cardiovascular: Normal heart size. No significant  pericardial fluid/thickening. Left subclavian MediPort terminates at the cavoatrial junction. Atherosclerotic nonaneurysmal thoracic aorta. Normal caliber pulmonary arteries. No central pulmonary emboli. Mediastinum/Nodes: No discrete thyroid nodules. Unremarkable esophagus. No pathologically enlarged axillary, mediastinal or hilar lymph nodes. Lungs/Pleura: No pneumothorax. No pleural effusion. Stable calcified subcentimeter left upper lobe and right lower lobe granulomas. Mild centrilobular and paraseptal emphysema. Solid noncalcified 3 mm right upper lobe pulmonary nodule (series 6/ image 54) is stable since 10/06/2015. Solid noncalcified 2 mm posterior right upper lobe pulmonary nodule (series 6/ image 68) is stable since 10/06/2015. No acute consolidative airspace disease, lung masses or new significant pulmonary nodules. Stable parenchymal bands in the dependent lower lobes consistent with postinfectious/postinflammatory scarring versus subsegmental atelectasis. Musculoskeletal: No aggressive appearing focal osseous lesions. Soft tissue anchors are noted in the right humeral head. Moderate thoracic spondylosis. CT ABDOMEN PELVIS FINDINGS Hepatobiliary: Normal liver size. There are four scattered sub 5 mm hypodense lesions throughout the liver, too small to characterize, all stable since 09/20/2015, suggesting benign lesions. No new liver lesions. Normal gallbladder with no radiopaque cholelithiasis. No biliary ductal dilatation. Pancreas: There are two metallic fiducial markers at the site of the known hypoenhancing pancreatic body mass, which measures approximately 2.0 x 1.6 cm (series 4/ image 66), decreased from 2.7 x 2.2 cm on 12/07/2015. There is stable infiltrative soft tissue extending superiorly from the pancreatic body mass to involve the proximal splenic artery and inferiorly to involve the proximal SMA and the common hepatic artery. There is stable atrophy of the distal pancreatic body and tail with  dilatation of the distal main pancreatic duct up to 5 mm diameter. No new pancreatic mass. Spleen: Normal size. No mass. Adrenals/Urinary Tract: Normal adrenals. No hydronephrosis. Simple 1.8  cm lateral upper right renal cyst. Simple left renal cysts measuring up to the 2.5 cm. Subcentimeter hypodense renal cortical lesion in the posterior lower left kidney, too small to characterize, stable, consistent with a benign cysts. No new renal lesions. Limited visualization of the bladder due to streak artifact from the left hip hardware. Bladder is nearly collapsed and grossly normal. Stomach/Bowel: Grossly normal stomach. Normal caliber small bowel with no small bowel wall thickening. Appendectomy. Stable postsurgical changes from partial distal colectomy with intact appearing distal colonic anastomosis. Oral contrast progresses to the distal rectum. Mild scattered colonic diverticulosis. No large bowel wall thickening or pericolonic fat stranding. Vascular/Lymphatic: Atherosclerotic nonaneurysmal abdominal aorta. Patent portal, hepatic and renal veins. Stable extrinsic high-grade narrowing of the splenic vein near the portal splenic venous confluence. Splenic vein remains patent. No pathologically enlarged lymph nodes in the abdomen or pelvis. Reproductive: Stable prominently enlarged prostate gland. Other: No pneumoperitoneum, ascites or focal fluid collection. Musculoskeletal: No aggressive appearing focal osseous lesions. Severe lumbar spondylosis. Severe right hip osteoarthritis. Partially visualized left total hip arthroplasty. IMPRESSION: 1. No definite findings of metastatic disease in the chest. Two stable tiny noncalcified right upper lobe pulmonary nodules, largest 3 mm, for which 5 month stability has been demonstrated, probably benign. 2. Partial treatment response at the site of the pancreatic body neoplasm. No change in infiltrative soft tissue extending superiorly and inferiorly from the pancreatic  malignancy site. 3. No evidence of metastatic disease in the abdomen or pelvis. Scattered subcentimeter hypodense liver lesions are stable for 6 months and probably benign. 4. Additional findings include aortic atherosclerosis, mild emphysema, mild scattered colonic diverticulosis and prominently enlarged prostate. Electronically Signed   By: Ilona Sorrel M.D.   On: 03/23/2016 15:15   Ct Abdomen Pelvis W Contrast  Result Date: 03/23/2016 CLINICAL DATA:  81 year old male diagnosed with pancreatic cancer in July 2017 status post radiation therapy completed October 2017 and chemotherapy completed November 2017, presenting for restaging. Additional history of partial colectomy for perforated diverticulitis, bilateral inguinal hernia repair and appendectomy. EXAM: CT CHEST, ABDOMEN, AND PELVIS WITH CONTRAST TECHNIQUE: Multidetector CT imaging of the chest, abdomen and pelvis was performed following the standard protocol during bolus administration of intravenous contrast. CONTRAST:  192mL ISOVUE-300 IOPAMIDOL (ISOVUE-300) INJECTION 61% COMPARISON:  12/07/2015 CT abdomen/ pelvis. 10/06/2015 CT chest and abdomen. FINDINGS: CT CHEST FINDINGS Cardiovascular: Normal heart size. No significant pericardial fluid/thickening. Left subclavian MediPort terminates at the cavoatrial junction. Atherosclerotic nonaneurysmal thoracic aorta. Normal caliber pulmonary arteries. No central pulmonary emboli. Mediastinum/Nodes: No discrete thyroid nodules. Unremarkable esophagus. No pathologically enlarged axillary, mediastinal or hilar lymph nodes. Lungs/Pleura: No pneumothorax. No pleural effusion. Stable calcified subcentimeter left upper lobe and right lower lobe granulomas. Mild centrilobular and paraseptal emphysema. Solid noncalcified 3 mm right upper lobe pulmonary nodule (series 6/ image 54) is stable since 10/06/2015. Solid noncalcified 2 mm posterior right upper lobe pulmonary nodule (series 6/ image 68) is stable since  10/06/2015. No acute consolidative airspace disease, lung masses or new significant pulmonary nodules. Stable parenchymal bands in the dependent lower lobes consistent with postinfectious/postinflammatory scarring versus subsegmental atelectasis. Musculoskeletal: No aggressive appearing focal osseous lesions. Soft tissue anchors are noted in the right humeral head. Moderate thoracic spondylosis. CT ABDOMEN PELVIS FINDINGS Hepatobiliary: Normal liver size. There are four scattered sub 5 mm hypodense lesions throughout the liver, too small to characterize, all stable since 09/20/2015, suggesting benign lesions. No new liver lesions. Normal gallbladder with no radiopaque cholelithiasis. No biliary ductal dilatation. Pancreas: There are  two metallic fiducial markers at the site of the known hypoenhancing pancreatic body mass, which measures approximately 2.0 x 1.6 cm (series 4/ image 66), decreased from 2.7 x 2.2 cm on 12/07/2015. There is stable infiltrative soft tissue extending superiorly from the pancreatic body mass to involve the proximal splenic artery and inferiorly to involve the proximal SMA and the common hepatic artery. There is stable atrophy of the distal pancreatic body and tail with dilatation of the distal main pancreatic duct up to 5 mm diameter. No new pancreatic mass. Spleen: Normal size. No mass. Adrenals/Urinary Tract: Normal adrenals. No hydronephrosis. Simple 1.8 cm lateral upper right renal cyst. Simple left renal cysts measuring up to the 2.5 cm. Subcentimeter hypodense renal cortical lesion in the posterior lower left kidney, too small to characterize, stable, consistent with a benign cysts. No new renal lesions. Limited visualization of the bladder due to streak artifact from the left hip hardware. Bladder is nearly collapsed and grossly normal. Stomach/Bowel: Grossly normal stomach. Normal caliber small bowel with no small bowel wall thickening. Appendectomy. Stable postsurgical changes from  partial distal colectomy with intact appearing distal colonic anastomosis. Oral contrast progresses to the distal rectum. Mild scattered colonic diverticulosis. No large bowel wall thickening or pericolonic fat stranding. Vascular/Lymphatic: Atherosclerotic nonaneurysmal abdominal aorta. Patent portal, hepatic and renal veins. Stable extrinsic high-grade narrowing of the splenic vein near the portal splenic venous confluence. Splenic vein remains patent. No pathologically enlarged lymph nodes in the abdomen or pelvis. Reproductive: Stable prominently enlarged prostate gland. Other: No pneumoperitoneum, ascites or focal fluid collection. Musculoskeletal: No aggressive appearing focal osseous lesions. Severe lumbar spondylosis. Severe right hip osteoarthritis. Partially visualized left total hip arthroplasty. IMPRESSION: 1. No definite findings of metastatic disease in the chest. Two stable tiny noncalcified right upper lobe pulmonary nodules, largest 3 mm, for which 5 month stability has been demonstrated, probably benign. 2. Partial treatment response at the site of the pancreatic body neoplasm. No change in infiltrative soft tissue extending superiorly and inferiorly from the pancreatic malignancy site. 3. No evidence of metastatic disease in the abdomen or pelvis. Scattered subcentimeter hypodense liver lesions are stable for 6 months and probably benign. 4. Additional findings include aortic atherosclerosis, mild emphysema, mild scattered colonic diverticulosis and prominently enlarged prostate. Electronically Signed   By: Ilona Sorrel M.D.   On: 03/23/2016 15:15    ASSESSMENT & PLAN: 81 y.o. African-American male, presented with epigastric pain for a month.  1. Primary pancreatic cancer, adenocarcinoma in the body of pancreas,cT2N1M0, stage IIB, probable unresectable  -I previously reviewed his CT scan findings, EUS findings and pancreatic mass biopsy pathology results with pt and his wife in  details. -His CT scan showed no evidence of distant metastasis, there is a 14 mm hepatoduodenal node on CT which is suspicious for nodal metastasis, EUS was negative for peripancreatic adenopathy.  -he has completed 2 cycles of Abraxane and gemcitabine, tolerated very well. -We previously reviewed his restaging CT scan etiology at tumor board a few days ago, he had a good partial response, but his tumor is still abuts some of the surrounding vessels, Dr. Barry Dienes feels it's not resectable for now  -He has completed SBRT, tolerated very well -He is clinically doing well, without much symptoms. We discussed the option of surgery again, patient does not want to go to other major academic [facility] for second opinion about his surgery. -The goal of therapy is likely palliative, I'll observe him for now, he is willing to take  chemotherapy if he has disease progression -I discussed his restaging CT scan from last week, which showed stable pancreatic mass, no other evidence of metastasis. Other incidental findings from the CT scan were discussed with patient also. -He is clinically doing very well, asymptomatic, gaining weight, his CA 19.9 has decreased to normal. We'll continue monitoring.  2. DM -His recent HbA1c was 7.2 in 11/2015 -He takes Metformin twice daily and checks his sugar once daily -He will continue follow-up with his primary care physician  3. Prostate cancer, untreated  -His prostate cancer has been monitored by his urologist, treatment was not recommended -will follow up clinically   4. HTN -Continue medication, follow up with PCP  Plan -lab and scan results discussed  -continue observation for now  -he will need port flushed today and I'll schedule port flush every 4 weeks -Lab and follow-up in 2 months, I plan to repeat CT scan in 4 months.  All questions were answered. The patient knows to call the clinic with any problems, questions or concerns. I spent 20 minutes  counseling the patient face to face. The total time spent in the appointment was 25 minutes and more than 50% was on counseling.   Truitt Merle, MD 03/28/2016

## 2016-03-28 ENCOUNTER — Ambulatory Visit (HOSPITAL_BASED_OUTPATIENT_CLINIC_OR_DEPARTMENT_OTHER): Payer: Medicare Other | Admitting: Hematology

## 2016-03-28 ENCOUNTER — Telehealth: Payer: Self-pay | Admitting: Hematology

## 2016-03-28 ENCOUNTER — Other Ambulatory Visit: Payer: Self-pay | Admitting: *Deleted

## 2016-03-28 ENCOUNTER — Encounter: Payer: Self-pay | Admitting: Hematology

## 2016-03-28 VITALS — BP 146/61 | HR 93 | Temp 98.0°F | Resp 18 | Ht 70.0 in | Wt 140.1 lb

## 2016-03-28 DIAGNOSIS — C251 Malignant neoplasm of body of pancreas: Secondary | ICD-10-CM

## 2016-03-28 DIAGNOSIS — I10 Essential (primary) hypertension: Secondary | ICD-10-CM

## 2016-03-28 DIAGNOSIS — C61 Malignant neoplasm of prostate: Secondary | ICD-10-CM | POA: Diagnosis not present

## 2016-03-28 DIAGNOSIS — E119 Type 2 diabetes mellitus without complications: Secondary | ICD-10-CM | POA: Diagnosis not present

## 2016-03-28 MED ORDER — HEPARIN SOD (PORK) LOCK FLUSH 100 UNIT/ML IV SOLN
500.0000 [IU] | Freq: Once | INTRAVENOUS | Status: AC
  Administered 2016-03-28: 500 [IU] via INTRAVENOUS
  Filled 2016-03-28: qty 5

## 2016-03-28 MED ORDER — SODIUM CHLORIDE 0.9% FLUSH
10.0000 mL | INTRAVENOUS | Status: DC | PRN
  Administered 2016-03-28: 10 mL via INTRAVENOUS
  Filled 2016-03-28: qty 10

## 2016-03-28 NOTE — Telephone Encounter (Signed)
Lab, flush and follow up appointments was scheduled per 03/28/16 los.  Patient was given a copy of the AVS report and appointment schedule per 03/28/16 los.

## 2016-04-25 ENCOUNTER — Other Ambulatory Visit (HOSPITAL_BASED_OUTPATIENT_CLINIC_OR_DEPARTMENT_OTHER): Payer: Medicare Other

## 2016-04-25 ENCOUNTER — Ambulatory Visit (HOSPITAL_BASED_OUTPATIENT_CLINIC_OR_DEPARTMENT_OTHER): Payer: Medicare Other

## 2016-04-25 DIAGNOSIS — C251 Malignant neoplasm of body of pancreas: Secondary | ICD-10-CM

## 2016-04-25 DIAGNOSIS — C259 Malignant neoplasm of pancreas, unspecified: Secondary | ICD-10-CM | POA: Diagnosis not present

## 2016-04-25 DIAGNOSIS — Z95828 Presence of other vascular implants and grafts: Secondary | ICD-10-CM

## 2016-04-25 LAB — COMPREHENSIVE METABOLIC PANEL
ALBUMIN: 4 g/dL (ref 3.5–5.0)
ALK PHOS: 79 U/L (ref 40–150)
ALT: 19 U/L (ref 0–55)
AST: 23 U/L (ref 5–34)
Anion Gap: 11 mEq/L (ref 3–11)
BUN: 16 mg/dL (ref 7.0–26.0)
CALCIUM: 9.7 mg/dL (ref 8.4–10.4)
CO2: 26 mEq/L (ref 22–29)
CREATININE: 0.9 mg/dL (ref 0.7–1.3)
Chloride: 104 mEq/L (ref 98–109)
EGFR: 90 mL/min/{1.73_m2} — ABNORMAL LOW (ref >90)
Glucose: 147 mg/dl — ABNORMAL HIGH (ref 70–140)
Potassium: 3.6 mEq/L (ref 3.5–5.1)
Sodium: 141 mEq/L (ref 136–145)
Total Bilirubin: 0.47 mg/dL (ref 0.20–1.20)
Total Protein: 7.6 g/dL (ref 6.4–8.3)

## 2016-04-25 LAB — CBC WITH DIFFERENTIAL/PLATELET
BASO%: 0.6 % (ref 0.0–2.0)
Basophils Absolute: 0 10*3/uL (ref 0.0–0.1)
EOS%: 2.3 % (ref 0.0–7.0)
Eosinophils Absolute: 0.1 10*3/uL (ref 0.0–0.5)
HEMATOCRIT: 42.8 % (ref 38.4–49.9)
HEMOGLOBIN: 14.3 g/dL (ref 13.0–17.1)
LYMPH#: 1 10*3/uL (ref 0.9–3.3)
LYMPH%: 16.1 % (ref 14.0–49.0)
MCH: 27 pg — ABNORMAL LOW (ref 27.2–33.4)
MCHC: 33.3 g/dL (ref 32.0–36.0)
MCV: 81.1 fL (ref 79.3–98.0)
MONO#: 0.5 10*3/uL (ref 0.1–0.9)
MONO%: 8 % (ref 0.0–14.0)
NEUT%: 73 % (ref 39.0–75.0)
NEUTROS ABS: 4.6 10*3/uL (ref 1.5–6.5)
Platelets: 178 10*3/uL (ref 140–400)
RBC: 5.28 10*6/uL (ref 4.20–5.82)
RDW: 14.1 % (ref 11.0–14.6)
WBC: 6.3 10*3/uL (ref 4.0–10.3)

## 2016-04-25 MED ORDER — SODIUM CHLORIDE 0.9% FLUSH
10.0000 mL | INTRAVENOUS | Status: DC | PRN
  Administered 2016-04-25: 10 mL via INTRAVENOUS
  Filled 2016-04-25: qty 10

## 2016-04-25 MED ORDER — HEPARIN SOD (PORK) LOCK FLUSH 100 UNIT/ML IV SOLN
500.0000 [IU] | Freq: Once | INTRAVENOUS | Status: AC | PRN
  Administered 2016-04-25: 500 [IU] via INTRAVENOUS
  Filled 2016-04-25: qty 5

## 2016-04-26 ENCOUNTER — Encounter: Payer: Self-pay | Admitting: Nurse Practitioner

## 2016-04-26 ENCOUNTER — Ambulatory Visit (INDEPENDENT_AMBULATORY_CARE_PROVIDER_SITE_OTHER): Payer: Medicare Other | Admitting: Nurse Practitioner

## 2016-04-26 VITALS — BP 150/68 | HR 92 | Temp 97.4°F | Ht 70.0 in | Wt 137.0 lb

## 2016-04-26 DIAGNOSIS — H6123 Impacted cerumen, bilateral: Secondary | ICD-10-CM

## 2016-04-26 LAB — CANCER ANTIGEN 19-9: CA 19-9: 314 U/mL — ABNORMAL HIGH (ref 0–35)

## 2016-04-26 NOTE — Progress Notes (Signed)
Subjective:  Patient ID: [name], male    DOB: 11/11/1935  Age: 81 y.o. MRN: [id number]  CC: Follow-up (can not hear out of right ear every time he fly. couple day ago couldnt hear, bouth OCT to crean ear, today better. )   Ear Fullness   There is pain in the right ear. This is a recurrent problem. The current episode started 1 to 4 weeks ago. The problem occurs constantly. The problem has been waxing and waning. There has been no fever. The patient is experiencing no pain. Associated symptoms include hearing loss. Pertinent negatives include no abdominal pain, coughing, diarrhea, ear discharge, neck pain, rash, rhinorrhea, sore throat or vomiting. He has tried ear drops for the symptoms. The treatment provided mild relief. There is no history of a chronic ear infection, hearing loss or a tympanostomy tube.    Outpatient Medications Prior to Visit  Medication Sig Dispense Refill  . amLODipine (NORVASC) 10 MG tablet Take 1 tablet (10 mg total) by mouth daily. 90 tablet 3  . aspirin 81 MG tablet Take 1 tablet (81 mg total) by mouth daily. 30 tablet   . doxazosin (CARDURA) 1 MG tablet TAKE 1 TABLET BY MOUTH EVERY DAY 90 tablet 2  . hydrochlorothiazide (MICROZIDE) 12.5 MG capsule Take 1 capsule (12.5 mg total) by mouth daily. 90 capsule 3  . latanoprost (XALATAN) 0.005 % ophthalmic solution Place 1 drop into both eyes at bedtime.  1  . lidocaine-prilocaine (EMLA) cream Apply 1 application topically as needed. Apply to pac site 1 hour prior to stick and cover with plastic wrap 30 g 11  . metFORMIN (GLUCOPHAGE) 500 MG tablet Take 1 tablet (500 mg total) by mouth 2 (two) times daily with a meal. 180 tablet 3  . multivitamin-lutein (OCUVITE-LUTEIN) CAPS Take 1 capsule by mouth daily.    Glory Rosebush DELICA LANCETS 99991111 MISC Use to check blood sugars once a day Dx E11.9 100 each 3  . ONETOUCH VERIO test strip CHECK BLOOD SUGAR ONCE DAILY. DX250.00 100 each 5  . ONETOUCH VERIO test strip CHECK  BLOOD SUGAR ONCE DAILY 100 each 3  . pravastatin (PRAVACHOL) 20 MG tablet Take 1 tablet (20 mg total) by mouth at bedtime. 90 tablet 3   No facility-administered medications prior to visit.     ROS See HPI  Objective:  BP (!) 150/68   Pulse 92   Temp 97.4 F (36.3 C)   Ht 5\' 10"  (1.778 m)   Wt 137 lb (62.1 kg)   SpO2 98%   BMI 19.66 kg/m   BP Readings from Last 3 Encounters:  04/26/16 (!) 150/68  03/28/16 (!) 146/61  02/25/16 (!) 154/62    Wt Readings from Last 3 Encounters:  04/26/16 137 lb (62.1 kg)  03/28/16 140 lb 1.6 oz (63.5 kg)  02/25/16 132 lb 11.2 oz (60.2 kg)    Physical Exam  Constitutional: He is oriented to person, place, and time. No distress.  HENT:  Right Ear: Tympanic membrane, external ear and ear canal normal. No tenderness. No mastoid tenderness. Tympanic membrane is not injected. No middle ear effusion.  Left Ear: Tympanic membrane and ear canal normal. No tenderness. No mastoid tenderness. Tympanic membrane is not injected.  No middle ear effusion.  Nose: No mucosal edema or rhinorrhea. Right sinus exhibits no maxillary sinus tenderness and no frontal sinus tenderness. Left sinus exhibits no maxillary sinus tenderness and no frontal sinus tenderness.  Mouth/Throat: Uvula is midline. No oropharyngeal exudate.  Procedure Note :    Procedure : cerumen removal (bilateral)  Indication:  Cerumen impaction   Risks, including pain, dizziness, eardrum perforation, bleeding, infection and others as well as benefits were explained to the patient in detail. Verbal consent was obtained and the patient agreed to proceed.    Procedure required manual wax removal with an ear loop.   Tolerated well. Complications: None.   Postprocedure instructions :  Call if problems.  Patient reports significant improvement in hearing after removal of cerumen.  Eyes: No scleral icterus.  Neck: Normal range of motion. Neck supple.  Cardiovascular: Normal rate and regular  rhythm.   Pulmonary/Chest: Effort normal and breath sounds normal.  Lymphadenopathy:    He has no cervical adenopathy.  Neurological: He is alert and oriented to person, place, and time.  Vitals reviewed.   Lab Results  Component Value Date   WBC 6.3 04/25/2016   HGB 14.3 04/25/2016   HCT 42.8 04/25/2016   PLT 178 04/25/2016   GLUCOSE 147 (H) 04/25/2016   CHOL 153 06/17/2015   TRIG 123.0 06/17/2015   HDL 44.90 06/17/2015   LDLCALC 83 06/17/2015   ALT 19 04/25/2016   AST 23 04/25/2016   NA 141 04/25/2016   K 3.6 04/25/2016   CL 103 09/07/2015   CREATININE 0.9 04/25/2016   BUN 16.0 04/25/2016   CO2 26 04/25/2016   TSH 1.74 06/17/2015   PSA 6.10 (H) 09/29/2009   INR 0.96 03/29/2012   HGBA1C 7.2 (H) 12/20/2015   MICROALBUR 5.3 (H) 09/07/2015    Ct Chest W Contrast  Result Date: 03/23/2016 CLINICAL DATA:  81 year old male diagnosed with pancreatic cancer in July 2017 status post radiation therapy completed October 2017 and chemotherapy completed November 2017, presenting for restaging. Additional history of partial colectomy for perforated diverticulitis, bilateral inguinal hernia repair and appendectomy. EXAM: CT CHEST, ABDOMEN, AND PELVIS WITH CONTRAST TECHNIQUE: Multidetector CT imaging of the chest, abdomen and pelvis was performed following the standard protocol during bolus administration of intravenous contrast. CONTRAST:  138mL ISOVUE-300 IOPAMIDOL (ISOVUE-300) INJECTION 61% COMPARISON:  12/07/2015 CT abdomen/ pelvis. 10/06/2015 CT chest and abdomen. FINDINGS: CT CHEST FINDINGS Cardiovascular: Normal heart size. No significant pericardial fluid/thickening. Left subclavian MediPort terminates at the cavoatrial junction. Atherosclerotic nonaneurysmal thoracic aorta. Normal caliber pulmonary arteries. No central pulmonary emboli. Mediastinum/Nodes: No discrete thyroid nodules. Unremarkable esophagus. No pathologically enlarged axillary, mediastinal or hilar lymph nodes.  Lungs/Pleura: No pneumothorax. No pleural effusion. Stable calcified subcentimeter left upper lobe and right lower lobe granulomas. Mild centrilobular and paraseptal emphysema. Solid noncalcified 3 mm right upper lobe pulmonary nodule (series 6/ image 54) is stable since 10/06/2015. Solid noncalcified 2 mm posterior right upper lobe pulmonary nodule (series 6/ image 68) is stable since 10/06/2015. No acute consolidative airspace disease, lung masses or new significant pulmonary nodules. Stable parenchymal bands in the dependent lower lobes consistent with postinfectious/postinflammatory scarring versus subsegmental atelectasis. Musculoskeletal: No aggressive appearing focal osseous lesions. Soft tissue anchors are noted in the right humeral head. Moderate thoracic spondylosis. CT ABDOMEN PELVIS FINDINGS Hepatobiliary: Normal liver size. There are four scattered sub 5 mm hypodense lesions throughout the liver, too small to characterize, all stable since 09/20/2015, suggesting benign lesions. No new liver lesions. Normal gallbladder with no radiopaque cholelithiasis. No biliary ductal dilatation. Pancreas: There are two metallic fiducial markers at the site of the known hypoenhancing pancreatic body mass, which measures approximately 2.0 x 1.6 cm (series 4/ image 66), decreased from 2.7 x 2.2 cm on 12/07/2015. There  is stable infiltrative soft tissue extending superiorly from the pancreatic body mass to involve the proximal splenic artery and inferiorly to involve the proximal SMA and the common hepatic artery. There is stable atrophy of the distal pancreatic body and tail with dilatation of the distal main pancreatic duct up to 5 mm diameter. No new pancreatic mass. Spleen: Normal size. No mass. Adrenals/Urinary Tract: Normal adrenals. No hydronephrosis. Simple 1.8 cm lateral upper right renal cyst. Simple left renal cysts measuring up to the 2.5 cm. Subcentimeter hypodense renal cortical lesion in the posterior  lower left kidney, too small to characterize, stable, consistent with a benign cysts. No new renal lesions. Limited visualization of the bladder due to streak artifact from the left hip hardware. Bladder is nearly collapsed and grossly normal. Stomach/Bowel: Grossly normal stomach. Normal caliber small bowel with no small bowel wall thickening. Appendectomy. Stable postsurgical changes from partial distal colectomy with intact appearing distal colonic anastomosis. Oral contrast progresses to the distal rectum. Mild scattered colonic diverticulosis. No large bowel wall thickening or pericolonic fat stranding. Vascular/Lymphatic: Atherosclerotic nonaneurysmal abdominal aorta. Patent portal, hepatic and renal veins. Stable extrinsic high-grade narrowing of the splenic vein near the portal splenic venous confluence. Splenic vein remains patent. No pathologically enlarged lymph nodes in the abdomen or pelvis. Reproductive: Stable prominently enlarged prostate gland. Other: No pneumoperitoneum, ascites or focal fluid collection. Musculoskeletal: No aggressive appearing focal osseous lesions. Severe lumbar spondylosis. Severe right hip osteoarthritis. Partially visualized left total hip arthroplasty. IMPRESSION: 1. No definite findings of metastatic disease in the chest. Two stable tiny noncalcified right upper lobe pulmonary nodules, largest 3 mm, for which 5 month stability has been demonstrated, probably benign. 2. Partial treatment response at the site of the pancreatic body neoplasm. No change in infiltrative soft tissue extending superiorly and inferiorly from the pancreatic malignancy site. 3. No evidence of metastatic disease in the abdomen or pelvis. Scattered subcentimeter hypodense liver lesions are stable for 6 months and probably benign. 4. Additional findings include aortic atherosclerosis, mild emphysema, mild scattered colonic diverticulosis and prominently enlarged prostate. Electronically Signed   By:  Ilona Sorrel M.D.   On: 03/23/2016 15:15   Ct Abdomen Pelvis W Contrast  Result Date: 03/23/2016 CLINICAL DATA:  81 year old male diagnosed with pancreatic cancer in July 2017 status post radiation therapy completed October 2017 and chemotherapy completed November 2017, presenting for restaging. Additional history of partial colectomy for perforated diverticulitis, bilateral inguinal hernia repair and appendectomy. EXAM: CT CHEST, ABDOMEN, AND PELVIS WITH CONTRAST TECHNIQUE: Multidetector CT imaging of the chest, abdomen and pelvis was performed following the standard protocol during bolus administration of intravenous contrast. CONTRAST:  157mL ISOVUE-300 IOPAMIDOL (ISOVUE-300) INJECTION 61% COMPARISON:  12/07/2015 CT abdomen/ pelvis. 10/06/2015 CT chest and abdomen. FINDINGS: CT CHEST FINDINGS Cardiovascular: Normal heart size. No significant pericardial fluid/thickening. Left subclavian MediPort terminates at the cavoatrial junction. Atherosclerotic nonaneurysmal thoracic aorta. Normal caliber pulmonary arteries. No central pulmonary emboli. Mediastinum/Nodes: No discrete thyroid nodules. Unremarkable esophagus. No pathologically enlarged axillary, mediastinal or hilar lymph nodes. Lungs/Pleura: No pneumothorax. No pleural effusion. Stable calcified subcentimeter left upper lobe and right lower lobe granulomas. Mild centrilobular and paraseptal emphysema. Solid noncalcified 3 mm right upper lobe pulmonary nodule (series 6/ image 54) is stable since 10/06/2015. Solid noncalcified 2 mm posterior right upper lobe pulmonary nodule (series 6/ image 68) is stable since 10/06/2015. No acute consolidative airspace disease, lung masses or new significant pulmonary nodules. Stable parenchymal bands in the dependent lower lobes consistent with postinfectious/postinflammatory scarring  versus subsegmental atelectasis. Musculoskeletal: No aggressive appearing focal osseous lesions. Soft tissue anchors are noted in the  right humeral head. Moderate thoracic spondylosis. CT ABDOMEN PELVIS FINDINGS Hepatobiliary: Normal liver size. There are four scattered sub 5 mm hypodense lesions throughout the liver, too small to characterize, all stable since 09/20/2015, suggesting benign lesions. No new liver lesions. Normal gallbladder with no radiopaque cholelithiasis. No biliary ductal dilatation. Pancreas: There are two metallic fiducial markers at the site of the known hypoenhancing pancreatic body mass, which measures approximately 2.0 x 1.6 cm (series 4/ image 66), decreased from 2.7 x 2.2 cm on 12/07/2015. There is stable infiltrative soft tissue extending superiorly from the pancreatic body mass to involve the proximal splenic artery and inferiorly to involve the proximal SMA and the common hepatic artery. There is stable atrophy of the distal pancreatic body and tail with dilatation of the distal main pancreatic duct up to 5 mm diameter. No new pancreatic mass. Spleen: Normal size. No mass. Adrenals/Urinary Tract: Normal adrenals. No hydronephrosis. Simple 1.8 cm lateral upper right renal cyst. Simple left renal cysts measuring up to the 2.5 cm. Subcentimeter hypodense renal cortical lesion in the posterior lower left kidney, too small to characterize, stable, consistent with a benign cysts. No new renal lesions. Limited visualization of the bladder due to streak artifact from the left hip hardware. Bladder is nearly collapsed and grossly normal. Stomach/Bowel: Grossly normal stomach. Normal caliber small bowel with no small bowel wall thickening. Appendectomy. Stable postsurgical changes from partial distal colectomy with intact appearing distal colonic anastomosis. Oral contrast progresses to the distal rectum. Mild scattered colonic diverticulosis. No large bowel wall thickening or pericolonic fat stranding. Vascular/Lymphatic: Atherosclerotic nonaneurysmal abdominal aorta. Patent portal, hepatic and renal veins. Stable extrinsic  high-grade narrowing of the splenic vein near the portal splenic venous confluence. Splenic vein remains patent. No pathologically enlarged lymph nodes in the abdomen or pelvis. Reproductive: Stable prominently enlarged prostate gland. Other: No pneumoperitoneum, ascites or focal fluid collection. Musculoskeletal: No aggressive appearing focal osseous lesions. Severe lumbar spondylosis. Severe right hip osteoarthritis. Partially visualized left total hip arthroplasty. IMPRESSION: 1. No definite findings of metastatic disease in the chest. Two stable tiny noncalcified right upper lobe pulmonary nodules, largest 3 mm, for which 5 month stability has been demonstrated, probably benign. 2. Partial treatment response at the site of the pancreatic body neoplasm. No change in infiltrative soft tissue extending superiorly and inferiorly from the pancreatic malignancy site. 3. No evidence of metastatic disease in the abdomen or pelvis. Scattered subcentimeter hypodense liver lesions are stable for 6 months and probably benign. 4. Additional findings include aortic atherosclerosis, mild emphysema, mild scattered colonic diverticulosis and prominently enlarged prostate. Electronically Signed   By: Ilona Sorrel M.D.   On: 03/23/2016 15:15    Assessment & Plan:   Sho was seen today for follow-up.  Diagnoses and all orders for this visit:  Hearing loss of both ears due to cerumen impaction   I am having Mr. Kuethe maintain his multivitamin-lutein, aspirin, ONETOUCH VERIO, latanoprost, ONETOUCH DELICA LANCETS [id number], doxazosin, metFORMIN, hydrochlorothiazide, lidocaine-prilocaine, amLODipine, pravastatin, and ONETOUCH VERIO.  No orders of the defined types were placed in this encounter.   Follow-up: Return if symptoms worsen or fail to improve.  Wilfred Fitzhenry, NP

## 2016-04-26 NOTE — Progress Notes (Signed)
Pre visit review using our clinic review tool, if applicable. No additional management support is needed unless otherwise documented below in the visit note. 

## 2016-05-02 DIAGNOSIS — C61 Malignant neoplasm of prostate: Secondary | ICD-10-CM | POA: Diagnosis not present

## 2016-05-06 ENCOUNTER — Other Ambulatory Visit: Payer: Self-pay | Admitting: Hematology

## 2016-05-06 DIAGNOSIS — C251 Malignant neoplasm of body of pancreas: Secondary | ICD-10-CM

## 2016-05-09 ENCOUNTER — Telehealth: Payer: Self-pay | Admitting: *Deleted

## 2016-05-09 NOTE — Telephone Encounter (Signed)
-----   Message from Truitt Merle, MD sent at 05/06/2016  8:46 PM EST ----- Please call pt and let him know the lab results. His CA19.9 has significantly increased, I would like him to get a repeated CT scan before his next appointment on 2/27, order is in epic.   Thanks  Truitt Merle

## 2016-05-09 NOTE — Telephone Encounter (Signed)
Called pt & informed of elevated CA19.9 & informed that Dr Burr Medico wants a CT Scan before next visit.  He states that he is having some abdominal pain.  Message sent to [facility] [place].

## 2016-05-19 ENCOUNTER — Encounter (HOSPITAL_COMMUNITY): Payer: Self-pay

## 2016-05-19 ENCOUNTER — Ambulatory Visit (HOSPITAL_COMMUNITY)
Admission: RE | Admit: 2016-05-19 | Discharge: 2016-05-19 | Disposition: A | Discharge status: Home / Self Care | Payer: Medicare Other | Source: Ambulatory Visit | Attending: Hematology | Admitting: Hematology

## 2016-05-19 DIAGNOSIS — C251 Malignant neoplasm of body of pancreas: Secondary | ICD-10-CM | POA: Diagnosis not present

## 2016-05-19 DIAGNOSIS — I700 Atherosclerosis of aorta: Secondary | ICD-10-CM | POA: Insufficient documentation

## 2016-05-19 DIAGNOSIS — R109 Unspecified abdominal pain: Secondary | ICD-10-CM | POA: Diagnosis not present

## 2016-05-19 DIAGNOSIS — R918 Other nonspecific abnormal finding of lung field: Secondary | ICD-10-CM | POA: Diagnosis not present

## 2016-05-19 DIAGNOSIS — K5730 Diverticulosis of large intestine without perforation or abscess without bleeding: Secondary | ICD-10-CM | POA: Insufficient documentation

## 2016-05-19 MED ORDER — IOPAMIDOL (ISOVUE-300) INJECTION 61%
100.0000 mL | Freq: Once | INTRAVENOUS | Status: AC | PRN
  Administered 2016-05-19: 100 mL via INTRAVENOUS

## 2016-05-19 MED ORDER — SODIUM CHLORIDE 0.9 % IJ SOLN
INTRAMUSCULAR | Status: AC
  Filled 2016-05-19: qty 50

## 2016-05-19 MED ORDER — IOPAMIDOL (ISOVUE-300) INJECTION 61%
INTRAVENOUS | Status: AC
  Filled 2016-05-19: qty 100

## 2016-05-19 NOTE — Progress Notes (Signed)
[facility]  [phone] [phone]  Clinic follow up Note   Patient Care Team: Stacy J Burns, MD as PCP - General (Internal Medicine) Faera Byerly, MD as Consulting Physician (General Surgery) 05/23/2016  CHIEF COMPLAINTS:  Follow-up pancreatic cancer  Oncology History   Primary pancreatic cancer (HCC)   Staging form: Pancreas, AJCC 7th Edition     Clinical stage from 09/30/2015: Stage IIB (T2, N1, M0) - Signed by Ho Parisi, MD on 10/07/2015       Malignant neoplasm of body of pancreas (HCC)   09/30/2015 Initial Diagnosis    Primary pancreatic cancer (HCC)      09/30/2015 Initial Biopsy     endoscopic pancreatic body mass fine-needle aspiration show malignant cells consistent with adenocarcinoma.      09/30/2015 Procedure     EUS showe a 2.5 cm pancreatic mass causing a stream the dictation of the main pancreatic duct, and directly abutting the SMA an      10/06/2015 Imaging     CT chest, abdomen and  pelvis showed a  4 cm mass in the body of  pancreas, tumor abut the anterior wall of the SMA, a 1.4cm  he pedal duoligament lymph nodes is suspicious, 3mm RUL lung nodule is nonspecific, no other distant mets      10/15/2015 -  Chemotherapy    neoadjuvant chemo gemcitabine 1000mg/m2 and abraxane 120mg/m2, on day 1, 8 every 21 days, held after 11/30/2015 due to radiation      01/12/2016 - 01/21/2016 Radiation Therapy    SBRT to pancreatic cancer, 33 Gy in 5 fractions        05/19/2016 Imaging    CT CAP w contrast Interval development of multiple soft tissue nodules within the anterior upper abdomen involving the right and left upper quadrants compatible with peritoneal metastatic disease. There is a small amount of new right perihepatic free fluid. Interval development of intrahepatic biliary ductal dilatation as well as dilation of the gallbladder. There is tapering of the central bile ducts at the level of soft tissue heterogeneity near the porta  hepatis which may represent a metastatic soft tissue mass or liver lesion. Slight interval increase in size of hypoenhancing pancreatic body mass with surrounding infiltrative tissue. Aortic atherosclerosis.  Sigmoid colonic diverticulosis.       HISTORY OF PRESENTING ILLNESS:  [name] 81 y.o. male is here because of His recently diagnosed pancreatic cancer. He presents to the clinic with his wife today.  He has been having abdominal pain for the past one month, worst in the past few weeks, radiates to back, 3-5/10, intermittent, worse with empty stomack, no nausea, bloating, BM is slightly constipated, once a daily. No melana or hematochezia. He has good appetie and eats well, mild fatigue, he is aboe to do his daily activities without difficulty, such as morning his long. He also exercise regularly, bikes a few miles a day. No significant weight loss recently.  He was seen by his primary care physician, and a CT chest and abdomen was done on 09/20/2015 which showed a 2.7 x 2.5 cm mass in the pancreatic body with associated pancreatic duct dilatation. He was referred to see GI Dr. Jacobs, and underwent EGD and EUS, backward a mass fine-needle biopsy, which showed adenocarcinoma.  He was diagnosed with prosttae can 5 years ago, has been monitored, no treatment, he has low PSA level.  He is diabetic, he is to be on metformin, has been off for a   while because his sugar has been relatively controlled with diet alone. His recent HbA1c was some 7.9 in 05/2015.   CURRENT THERAPY: observation   INTERIM HISTORY: Jaymian came in for follow up. He has developed some symptoms lately. He has constant abdominal discomfort, but not pain. This has been going on for the past month. It is mostly in the mid part of his stomach, and causes some nausea. He itches everywhere from his waist up. His urine is dark, but not tea colored. His appetite has been getting worse. He doesn't have a desire to eat and can't  eat much. He does get sharp hunger pains around lunch time, and the abdominal discomfort is worse after he eats. He has lost 10 lbs in the last month. Denies any other concerns.   MEDICAL HISTORY:  Past Medical History:  Diagnosis Date  . Arthritis    R hip and shoulder  . Complication of anesthesia    Local anesthetic used in office for Prostate biopsy" reaction Tachycardia,nausea, hallucinations, Blood pressure elevated"- No problems once done at hospital with anesthesia" Thinks Ciprofloxacin was the injection"   . Diabetes mellitus   . Enlarged prostate   . Femoral bruit    R femoral  . GERD with stricture    PMH of  . Hyperlipidemia   . Hypertension   . Macular degeneration    glaucoma suspect  . Pancreatic cancer (HCC) 09/2015   adenocarcinoma of pancreas  . PONV (postoperative nausea and vomiting)   . Prostate cancer (HCC)    prostate ; Dr Eskridge-continues to be monitored x 5 yrs now.  . Squamous papilloma    of esophagus    SURGICAL HISTORY: Past Surgical History:  Procedure Laterality Date  . ANKLE FUSION  2004  . COLONOSCOPY  05/2012  . esophageal dilation  2002   Dr  Malcolm Stark  . ESOPHAGOGASTRODUODENOSCOPY (EGD) WITH PROPOFOL N/A 09/30/2015   Procedure: ESOPHAGOGASTRODUODENOSCOPY (EGD) WITH PROPOFOL;  Surgeon: Daniel P Jacobs, MD;  Location: [facility];  Service: Endoscopy;  Laterality: N/A;  . EUS N/A 09/30/2015   Procedure: UPPER ENDOSCOPIC ULTRASOUND (EUS) RADIAL;  Surgeon: Daniel P Jacobs, MD;  Location: [facility];  Service: Endoscopy;  Laterality: N/A;  . FINE NEEDLE ASPIRATION N/A 09/30/2015   Procedure: FINE NEEDLE ASPIRATION (FNA) LINEAR;  Surgeon: Daniel P Jacobs, MD;  Location: [facility];  Service: Endoscopy;  Laterality: N/A;  . HERNIA REPAIR     bil inguinal hernias  . PARTIAL COLECTOMY     perforated diverticulitis  . PORTACATH PLACEMENT Left 10/13/2015   Procedure: INSERTION PORT-A-CATH;  Surgeon: Faera Byerly, MD;  Location: [facility];   Service: General;  Laterality: Left;  . PROSTATE BIOPSY  03/29/2012   Procedure: BIOPSY TRANSRECTAL ULTRASONIC PROSTATE (TUBP);  Surgeon: Matthew Ramsey Eskridge, MD;  Location: [facility];  Service: Urology;  Laterality: N/A;  . ROTATOR CUFF REPAIR  2006  . TOTAL HIP ARTHROPLASTY Left 2012    SOCIAL HISTORY: Social History   Social History  . Marital status: Married    Spouse name: Deloris  . Number of children: 1  . Years of education: N/A   Occupational History  . Retired     Court officer in [place]   Social History Main Topics  . Smoking status: Former Smoker    Packs/day: 0.50    Years: 40.00    Types: Cigarettes    Quit date: 03/27/1989  . Smokeless tobacco: Never Used     Comment: smoked 1955-   1991, up to 1 ppd  . Alcohol use No  . Drug use: No  . Sexual activity: Not on file   Other Topics Concern  . Not on file   Social History Narrative   Married to wife, Deloris for 54+ years   Retired here from [place]-was court officer in [place] [employer]   Enjoys outdoor activities and bikes 2 miles/day   Has one grown son and teen granddaughter in area   He is a retired court officer, used to live in [place]. He and his wife moved to  to be closer to his son.  He has one son and one Granddaughter who is 15.  FAMILY HISTORY: Family History  Problem Relation Age of Onset  . Kidney disease Father     ? Rheumatic fever as child  . Hypertension Father   . Heart attack Paternal Uncle     in 70s  . COPD Paternal Uncle     due to exposures in WW 2  . Kidney disease Paternal Aunt     ? Rheumatic fever as child  . Cancer Neg Hx   . Diabetes Neg Hx   . Stroke Neg Hx   . Colon cancer Neg Hx     ALLERGIES:  is allergic to ace inhibitors; angiotensin receptor blockers; and ciprofloxacin.  MEDICATIONS:  Current Outpatient Prescriptions  Medication Sig Dispense Refill  . amLODipine (NORVASC) 10 MG tablet Take 1 tablet (10 mg total) by mouth daily. 90  tablet 3  . aspirin 81 MG tablet Take 1 tablet (81 mg total) by mouth daily. 30 tablet   . doxazosin (CARDURA) 1 MG tablet TAKE 1 TABLET BY MOUTH EVERY DAY 90 tablet 2  . hydrochlorothiazide (MICROZIDE) 12.5 MG capsule Take 1 capsule (12.5 mg total) by mouth daily. 90 capsule 3  . latanoprost (XALATAN) 0.005 % ophthalmic solution Place 1 drop into both eyes at bedtime.  1  . lidocaine-prilocaine (EMLA) cream Apply 1 application topically as needed. Apply to pac site 1 hour prior to stick and cover with plastic wrap 30 g 11  . metFORMIN (GLUCOPHAGE) 500 MG tablet Take 1 tablet (500 mg total) by mouth 2 (two) times daily with a meal. 180 tablet 3  . multivitamin-lutein (OCUVITE-LUTEIN) CAPS Take 1 capsule by mouth daily.    . ONETOUCH DELICA LANCETS 33G MISC Use to check blood sugars once a day Dx E11.9 100 each 3  . ONETOUCH VERIO test strip CHECK BLOOD SUGAR ONCE DAILY. DX250.00 100 each 5  . ONETOUCH VERIO test strip CHECK BLOOD SUGAR ONCE DAILY 100 each 3  . pravastatin (PRAVACHOL) 20 MG tablet Take 1 tablet (20 mg total) by mouth at bedtime. 90 tablet 3  . traMADol (ULTRAM) 50 MG tablet Take 0.5 tablets (25 mg total) by mouth every 6 (six) hours as needed. 30 tablet 0   No current facility-administered medications for this visit.     REVIEW OF SYSTEMS:   Constitutional: Denies fevers, chills or abnormal night sweats (+) loss of appetite, weight loss (10 lbs) Eyes: Denies blurriness of vision, double vision or watery eyes Ears, nose, mouth, throat, and face: Denies mucositis or sore throat Respiratory: Denies cough, dyspnea or wheezes Cardiovascular: Denies palpitation, chest discomfort or lower extremity swelling Gastrointestinal:  Denies nausea, heartburn or change in bowel habits (+) abdominal discomfort, nausea GU: (+) dark urine Skin: Denies abnormal skin rashes (+) itching on upper body Lymphatics: Denies new lymphadenopathy or easy bruising Neurological:Denies numbness, tingling  or   new weaknesses Behavioral/Psych: Mood is stable, no new changes  All other systems were reviewed with the patient and are negative.  PHYSICAL EXAMINATION: ECOG PERFORMANCE STATUS: 1  BP (!) 146/78 (BP Location: Left Arm, Patient Position: Sitting)   Pulse 87   Temp 98.1 F (36.7 C) (Oral)   Resp 18   Ht 5' 10" (1.778 m)   Wt 127 lb 11.2 oz (57.9 kg)   SpO2 99%   BMI 18.32 kg/m   GENERAL:alert, no distress and comfortable SKIN: skin texture, turgor are normal, no rashes or significant lesions (+) jaundice  EYES: normal, conjunctiva are pink and non-injected, sclera clear OROPHARYNX:no exudate, no erythema and lips, buccal mucosa, and tongue normal  NECK: supple, thyroid normal size, non-tender, without nodularity LYMPH:  no palpable lymphadenopathy in the cervical, axillary or inguinal LUNGS: clear to auscultation and percussion with normal breathing effort HEART: regular rate & rhythm and no murmurs and no lower extremity edema ABDOMEN:abdomen soft, non-tender and normal bowel sounds Musculoskeletal:no cyanosis of digits and no clubbing  PSYCH: alert & oriented x 3 with fluent speech NEURO: no focal motor/sensory deficits EXT: no edema   LABORATORY DATA:  I have reviewed the data as listed CBC Latest Ref Rng & Units 05/23/2016 04/25/2016 03/23/2016  WBC 4.0 - 10.3 10e3/uL 7.0 6.3 6.3  Hemoglobin 13.0 - 17.1 g/dL 12.6(L) 14.3 15.0  Hematocrit 38.4 - 49.9 % 36.2(L) 42.8 45.9  Platelets 140 - 400 10e3/uL 247 178 189   CMP Latest Ref Rng & Units 05/23/2016 04/25/2016 03/23/2016  Glucose 70 - 140 mg/dl 232(H) 147(H) 126  BUN 7.0 - 26.0 mg/dL 18.6 16.0 12.9  Creatinine 0.7 - 1.3 mg/dL 1.3 0.9 1.0  Sodium 136 - 145 mEq/L 135(L) 141 142  Potassium 3.5 - 5.1 mEq/L 3.4(L) 3.6 3.3(L)  Chloride 96 - 112 mEq/L - - -  CO2 22 - 29 mEq/L 25 26 27  Calcium 8.4 - 10.4 mg/dL 9.4 9.7 9.5  Total Protein 6.4 - 8.3 g/dL 7.0 7.6 8.0  Total Bilirubin 0.20 - 1.20 mg/dL 16.32(HH) 0.47 0.40    Alkaline Phos 40 - 150 U/L 491(H) 79 90  AST 5 - 34 U/L 156(H) 23 28  ALT 0 - 55 U/L 279(HH) 19 22   CA19.9 (0-35U/ml) 09/30/2015: 370 10/07/2015: 516 10/29/2015: 381 11/26/2015: 84 01/27/2016: 13 02/25/2016: 22 04/25/2016: 314  PATHOLOGY REPORT Diagnosis 09/30/2015 FINE NEEDLE ASPIRATION, ENDOSCOPIC PANCREATIC BODY (SPECIMEN 1 OF 1 COLLECTED 09/30/2015) MALIGNANT CELLS PRESENT, CONSISTENT WITH ADENOCARCINOMA. SEE COMMENT. COMMENT: THE CASE WAS DISCUSSED WITH DR. JACOBS ON 10/01/2015.  RADIOGRAPHIC STUDIES: I have personally reviewed the radiological images as listed and agreed with the findings in the report. Ct Chest W Contrast  Result Date: 05/19/2016 CLINICAL DATA:  Patient with history of pancreatic cancer. Diffuse abdominal pain. EXAM: CT CHEST, ABDOMEN, AND PELVIS WITH CONTRAST TECHNIQUE: Multidetector CT imaging of the chest, abdomen and pelvis was performed following the standard protocol during bolus administration of intravenous contrast. CONTRAST:  100mL ISOVUE-300 IOPAMIDOL (ISOVUE-300) INJECTION 61% COMPARISON:  CT chest abdomen pelvis 03/23/2016. FINDINGS: CT CHEST FINDINGS Cardiovascular: Left anterior chest wall Port-A-Cath is present with tip terminating in the superior vena cava. Mediastinum/Nodes: No enlarged axillary, mediastinal or hilar lymphadenopathy. The esophagus is mildly patulous. Lungs/Pleura: Central airways are patent. Dependent atelectasis and or scarring within the lower lobes bilaterally. No large area pulmonary consolidation. Centrilobular and paraseptal emphysematous change. 5 mm calcified granuloma left upper lobe. No pleural effusion or pneumothorax. Musculoskeletal: No aggressive or acute appearing   osseous lesions. CT ABDOMEN PELVIS FINDINGS Hepatobiliary: Liver is normal in size and contour. Interval development of intrahepatic biliary ductal dilatation. The gallbladder is distended. There is focal tapering of the bile ducts centrally within the liver were there is  approximately 2.5 cm of abnormal appearing soft tissue in the porta hepatis (image 107; series 7). Pancreas: Two metallic fiducial markers re- demonstrated within the pancreatic body. Hypoenhancing pancreatic mass appears slightly increased when compared to prior exam measuring 2.5 x 1.4 cm (image 109; series 7), previously 1.6 x 2.0 cm. Upstream pancreatic ductal dilatation is similar. Stable infiltrative soft tissue extending superiorly from the pancreatic body involve the proximal splenic artery and involve the proximal superior mesenteric artery as well as common hepatic artery. Spleen: Unremarkable. Adrenals/Urinary Tract: The adrenal glands are normal. Kidneys enhance symmetrically with contrast. Stable bilateral renal cysts. No hydronephrosis. Urinary bladder is decompressed. Stomach/Bowel: Sigmoid colonic diverticulosis. No CT evidence for acute diverticulitis. No abnormal bowel wall thickening or evidence for bowel obstruction. Small amount of perihepatic free fluid, new from prior. Vascular/Lymphatic: Peripheral calcified atherosclerotic plaque involving the abdominal aorta. No retroperitoneal lymphadenopathy. Marked narrowing of the splenic vein near the porta splenic confluence. Reproductive: Enlarged prostate gland. Other: Interval development of multiple peritoneal soft tissue nodules within the anterior abdomen with a reference 1.4 cm nodule within the anterior right upper abdomen (image 124; series 7) and a reference 1.6 cm nodule within the anterior left upper abdomen (image 119; series 7). There is a 1.7 cm nodule anterior to the spleen (image 94; series 7). Musculoskeletal: Lumbar spine degenerative changes. Postsurgical changes proximal left femur. IMPRESSION: Interval development of multiple soft tissue nodules within the anterior upper abdomen involving the right and left upper quadrants compatible with peritoneal metastatic disease. There is a small amount of new right perihepatic free fluid.  Interval development of intrahepatic biliary ductal dilatation as well as dilation of the gallbladder. There is tapering of the central bile ducts at the level of soft tissue heterogeneity near the porta hepatis which may represent a metastatic soft tissue mass or liver lesion. Slight interval increase in size of hypoenhancing pancreatic body mass with surrounding infiltrative tissue. Aortic atherosclerosis.  Sigmoid colonic diverticulosis. Electronically Signed   By: Drew  Davis M.D.   On: 05/19/2016 10:47   Ct Abdomen Pelvis W Contrast  Result Date: 05/19/2016 CLINICAL DATA:  Patient with history of pancreatic cancer. Diffuse abdominal pain. EXAM: CT CHEST, ABDOMEN, AND PELVIS WITH CONTRAST TECHNIQUE: Multidetector CT imaging of the chest, abdomen and pelvis was performed following the standard protocol during bolus administration of intravenous contrast. CONTRAST:  100mL ISOVUE-300 IOPAMIDOL (ISOVUE-300) INJECTION 61% COMPARISON:  CT chest abdomen pelvis 03/23/2016. FINDINGS: CT CHEST FINDINGS Cardiovascular: Left anterior chest wall Port-A-Cath is present with tip terminating in the superior vena cava. Mediastinum/Nodes: No enlarged axillary, mediastinal or hilar lymphadenopathy. The esophagus is mildly patulous. Lungs/Pleura: Central airways are patent. Dependent atelectasis and or scarring within the lower lobes bilaterally. No large area pulmonary consolidation. Centrilobular and paraseptal emphysematous change. 5 mm calcified granuloma left upper lobe. No pleural effusion or pneumothorax. Musculoskeletal: No aggressive or acute appearing osseous lesions. CT ABDOMEN PELVIS FINDINGS Hepatobiliary: Liver is normal in size and contour. Interval development of intrahepatic biliary ductal dilatation. The gallbladder is distended. There is focal tapering of the bile ducts centrally within the liver were there is approximately 2.5 cm of abnormal appearing soft tissue in the porta hepatis (image 107; series 7).  Pancreas: Two metallic fiducial markers re- demonstrated within   the pancreatic body. Hypoenhancing pancreatic mass appears slightly increased when compared to prior exam measuring 2.5 x 1.4 cm (image 109; series 7), previously 1.6 x 2.0 cm. Upstream pancreatic ductal dilatation is similar. Stable infiltrative soft tissue extending superiorly from the pancreatic body involve the proximal splenic artery and involve the proximal superior mesenteric artery as well as common hepatic artery. Spleen: Unremarkable. Adrenals/Urinary Tract: The adrenal glands are normal. Kidneys enhance symmetrically with contrast. Stable bilateral renal cysts. No hydronephrosis. Urinary bladder is decompressed. Stomach/Bowel: Sigmoid colonic diverticulosis. No CT evidence for acute diverticulitis. No abnormal bowel wall thickening or evidence for bowel obstruction. Small amount of perihepatic free fluid, new from prior. Vascular/Lymphatic: Peripheral calcified atherosclerotic plaque involving the abdominal aorta. No retroperitoneal lymphadenopathy. Marked narrowing of the splenic vein near the porta splenic confluence. Reproductive: Enlarged prostate gland. Other: Interval development of multiple peritoneal soft tissue nodules within the anterior abdomen with a reference 1.4 cm nodule within the anterior right upper abdomen (image 124; series 7) and a reference 1.6 cm nodule within the anterior left upper abdomen (image 119; series 7). There is a 1.7 cm nodule anterior to the spleen (image 94; series 7). Musculoskeletal: Lumbar spine degenerative changes. Postsurgical changes proximal left femur. IMPRESSION: Interval development of multiple soft tissue nodules within the anterior upper abdomen involving the right and left upper quadrants compatible with peritoneal metastatic disease. There is a small amount of new right perihepatic free fluid. Interval development of intrahepatic biliary ductal dilatation as well as dilation of the  gallbladder. There is tapering of the central bile ducts at the level of soft tissue heterogeneity near the porta hepatis which may represent a metastatic soft tissue mass or liver lesion. Slight interval increase in size of hypoenhancing pancreatic body mass with surrounding infiltrative tissue. Aortic atherosclerosis.  Sigmoid colonic diverticulosis. Electronically Signed   By: Drew  Davis M.D.   On: 05/19/2016 10:47   ASSESSMENT & PLAN: 80 y.o. African-American male, presented with epigastric pain for a month.  1. Primary pancreatic cancer, adenocarcinoma in the body of pancreas,cT2N1M0, stage IIB, probable unresectable, peritoneal metastasis in 04/2016 -I previously reviewed his CT scan findings, EUS findings and pancreatic mass biopsy pathology results with pt and his wife in details. -His initial CT scan showed no evidence of distant metastasis, there is a 14 mm hepatoduodenal node on CT which is suspicious for nodal metastasis, EUS was negative for peripancreatic adenopathy.  -he has completed 2 cycles of Abraxane and gemcitabine, followed by radiation, tolerated very well. -unfortunately his recent CT scan from 05/19/2016 showed multiple peritoneal masses, his CEA has significantly increased, he also developed abdominal discomfort, this is highly suspicious for metastasis -I recommend him to have needle biopsy of his peritoneal mass and get tissue for genomic testing such as MSI.  -due to his high Tbili from obstruction, I will refer him back to GI for ERCP and stenting  -I recommend him to restart chemo gemcitabine and Abraxane, which he received about 6-7 months ago with good response and tolerance after his stent placement  -the goal of therapy is palliative  2. Obstructive jaundice  -secondary to pancreatic mass -CT scan showed no liver mets  -I spoke with GI Drs. Jacobs and stark, who saw him before. They will get him in ASAP for ERCP   3. Abdominal discomfort  -I gave him a  prescription of tramadol to use as need, I recommend him to avoid tylenol and NSAIDs for now   4. DM -He takes Metformin   twice daily and checks his sugar once daily -He will continue follow-up with his primary care physician  5. Prostate cancer, untreated  -His prostate cancer has been monitored by his urologist, treatment was not recommended -will follow up clinically    6. HTN -Continue medication, follow up with PCP  5. Abdominal Discomfort -I have recommended a mild pain medication like Aleve. -Prescribe Tramadol.   7. Anorexia and Weight Loss -Lost 10 lbs in the last month.  -He had diarrhea with Ensure.  -I have recommended that he try Boost or other nutrient supplements -follow up with dietitian    Plan -Ordered needle biopsy of his peritoneal mass by IR  -Prescribe Tramadol  -Dr. Stark's office will call him to schedule his ERCP  -he will need port flushed today and I'll schedule port flush every 4 weeks -Lab and follow-up in 2 weeks and restart chemotherapy in 2 weeks with gemcitabine and abraxane, after his biopsy.   All questions were answered. The patient knows to call the clinic with any problems, questions or concerns.  I spent 25 minutes counseling the patient face to face. The total time spent in the appointment was 30 minutes and more than 50% was on counseling.  This document serves as a record of services personally performed by Wendi Lastra, MD. It was created on her behalf by Jordan Casey, a trained medical scribe. The creation of this record is based on the scribe's personal observations and the provider's statements to them. This document has been checked and approved by the attending provider.  I have reviewed the above documentation for accuracy and completeness and I agree with the above.   Lunell Robart, MD 05/23/2016  

## 2016-05-23 ENCOUNTER — Ambulatory Visit (HOSPITAL_BASED_OUTPATIENT_CLINIC_OR_DEPARTMENT_OTHER): Payer: Medicare Other

## 2016-05-23 ENCOUNTER — Ambulatory Visit (HOSPITAL_BASED_OUTPATIENT_CLINIC_OR_DEPARTMENT_OTHER): Payer: Medicare Other | Admitting: Hematology

## 2016-05-23 ENCOUNTER — Telehealth: Payer: Self-pay

## 2016-05-23 ENCOUNTER — Other Ambulatory Visit: Payer: Self-pay

## 2016-05-23 ENCOUNTER — Other Ambulatory Visit (HOSPITAL_BASED_OUTPATIENT_CLINIC_OR_DEPARTMENT_OTHER): Payer: Medicare Other

## 2016-05-23 ENCOUNTER — Encounter: Payer: Self-pay | Admitting: Hematology

## 2016-05-23 ENCOUNTER — Telehealth: Payer: Self-pay | Admitting: Hematology

## 2016-05-23 VITALS — BP 146/78 | HR 87 | Temp 98.1°F | Resp 18 | Ht 70.0 in | Wt 127.7 lb

## 2016-05-23 DIAGNOSIS — C259 Malignant neoplasm of pancreas, unspecified: Secondary | ICD-10-CM

## 2016-05-23 DIAGNOSIS — C61 Malignant neoplasm of prostate: Secondary | ICD-10-CM | POA: Diagnosis not present

## 2016-05-23 DIAGNOSIS — C251 Malignant neoplasm of body of pancreas: Secondary | ICD-10-CM

## 2016-05-23 DIAGNOSIS — E119 Type 2 diabetes mellitus without complications: Secondary | ICD-10-CM | POA: Diagnosis not present

## 2016-05-23 DIAGNOSIS — K831 Obstruction of bile duct: Secondary | ICD-10-CM

## 2016-05-23 DIAGNOSIS — I10 Essential (primary) hypertension: Secondary | ICD-10-CM

## 2016-05-23 DIAGNOSIS — Z95828 Presence of other vascular implants and grafts: Secondary | ICD-10-CM

## 2016-05-23 LAB — COMPREHENSIVE METABOLIC PANEL
ALT: 279 U/L (ref 0–55)
AST: 156 U/L — AB (ref 5–34)
Albumin: 3.2 g/dL — ABNORMAL LOW (ref 3.5–5.0)
Alkaline Phosphatase: 491 U/L — ABNORMAL HIGH (ref 40–150)
Anion Gap: 12 mEq/L — ABNORMAL HIGH (ref 3–11)
BUN: 18.6 mg/dL (ref 7.0–26.0)
CALCIUM: 9.4 mg/dL (ref 8.4–10.4)
CHLORIDE: 98 meq/L (ref 98–109)
CO2: 25 mEq/L (ref 22–29)
Creatinine: 1.3 mg/dL (ref 0.7–1.3)
EGFR: 63 mL/min/{1.73_m2} — ABNORMAL LOW (ref >90)
GLUCOSE: 232 mg/dL — AB (ref 70–140)
POTASSIUM: 3.4 meq/L — AB (ref 3.5–5.1)
SODIUM: 135 meq/L — AB (ref 136–145)
Total Bilirubin: 16.32 mg/dL (ref 0.20–1.20)
Total Protein: 7 g/dL (ref 6.4–8.3)

## 2016-05-23 LAB — CBC WITH DIFFERENTIAL/PLATELET
BASO%: 0.6 % (ref 0.0–2.0)
BASOS ABS: 0 10*3/uL (ref 0.0–0.1)
EOS%: 0.7 % (ref 0.0–7.0)
Eosinophils Absolute: 0.1 10*3/uL (ref 0.0–0.5)
HCT: 36.2 % — ABNORMAL LOW (ref 38.4–49.9)
HGB: 12.6 g/dL — ABNORMAL LOW (ref 13.0–17.1)
LYMPH%: 11.9 % — ABNORMAL LOW (ref 14.0–49.0)
MCH: 27.2 pg (ref 27.2–33.4)
MCHC: 34.8 g/dL (ref 32.0–36.0)
MCV: 78 fL — ABNORMAL LOW (ref 79.3–98.0)
MONO#: 0.6 10*3/uL (ref 0.1–0.9)
MONO%: 7.9 % (ref 0.0–14.0)
NEUT#: 5.5 10*3/uL (ref 1.5–6.5)
NEUT%: 78.9 % — AB (ref 39.0–75.0)
Platelets: 247 10*3/uL (ref 140–400)
RBC: 4.64 10*6/uL (ref 4.20–5.82)
RDW: 17 % — ABNORMAL HIGH (ref 11.0–14.6)
WBC: 7 10*3/uL (ref 4.0–10.3)
lymph#: 0.8 10*3/uL — ABNORMAL LOW (ref 0.9–3.3)

## 2016-05-23 MED ORDER — TRAMADOL HCL 50 MG PO TABS: 25.0000 mg | ORAL_TABLET | Freq: Four times a day (QID) | ORAL | 0 refills | Status: AC | PRN

## 2016-05-23 MED ORDER — HEPARIN SOD (PORK) LOCK FLUSH 100 UNIT/ML IV SOLN
500.0000 [IU] | Freq: Once | INTRAVENOUS | Status: AC | PRN
  Administered 2016-05-23: 500 [IU] via INTRAVENOUS
  Filled 2016-05-23: qty 5

## 2016-05-23 MED ORDER — SODIUM CHLORIDE 0.9% FLUSH
10.0000 mL | INTRAVENOUS | Status: DC | PRN
Start: 2016-05-23 — End: 2016-05-23
  Administered 2016-05-23: 10 mL via INTRAVENOUS
  Filled 2016-05-23: qty 10

## 2016-05-23 NOTE — Telephone Encounter (Signed)
Patient notified of the appointments,  He and his wife verbalized understanding of all the instructions.  He is notified to be NPO after midnight, hold his metformin the am of the procedure and take his other medications the morning of the procedure with a sip of water.

## 2016-05-23 NOTE — Telephone Encounter (Signed)
-----   Message from Ladene Artist, MD sent at 05/23/2016 11:11 AM EST ----- Biliary obstruction, jaundice - needs ERCP. I could do Thursday at 12:30 or 1:00 if [facility] or [facility] can accommodate.    ----- Message ----- From: Milus Banister, MD Sent: 05/23/2016  10:55 AM To: Ladene Artist, MD, Truitt Merle, MD  Krista Blue, My endo schedule is full this week.  I will send to Dr. Fuller Plan who is his primary GI to see if he is able to help.  Thanks   Norberto Sorenson, See below.  I cannot help with ERCP this week. I do still have Thursday time on March 8th however. Let me know.  Thanks   DJ  ----- Message ----- From: Truitt Merle, MD Sent: 05/23/2016  10:32 AM To: Milus Banister, MD  Linna Hoff,  He has had major disease progression of his unresected pancreatic cancer, with peritoneal mets, but no liver mets on CT, TBIL 16.32 today. Could you get him in for ERCP and stent placement this week? He is on baby aspirin, feels OK overall, no need for hospital admission, I think.  You did his initial EUS  Thanks much,  Krista Blue

## 2016-05-23 NOTE — Telephone Encounter (Signed)
Gave patient avs report and appointments for March. [facility] will call re cx once order for IR bx entered.

## 2016-05-23 NOTE — Telephone Encounter (Signed)
Patient has been scheduled for ERCP on 05/25/16 at [facility].  Patient needs to arrive at 11:00.   Left message for patient to call back

## 2016-05-24 ENCOUNTER — Encounter (HOSPITAL_COMMUNITY): Payer: Self-pay | Admitting: *Deleted

## 2016-05-24 ENCOUNTER — Telehealth: Payer: Self-pay | Admitting: *Deleted

## 2016-05-24 LAB — CANCER ANTIGEN 19-9: CA 19-9: 1456 U/mL — ABNORMAL HIGH (ref 0–35)

## 2016-05-24 NOTE — Progress Notes (Signed)
Pt denies SOB, chest pain, and being under the care of a cardiologist. Pt denies having a stress test, echo and cardiac cath. Pt made aware to stop taking vitamins, fish oil and herbal medications.  Do not take any NSAIDs ie: Ibuprofen, Advil, Naproxen, BC and Goody Powder or any medication containing Aspirin. Pt made aware of diabetes protocol to not take Metformin the morning of procedure, check BG every 2 hours prior to procedure and interventions for BG < 70 ( drink 4 ounces of apple or cranberry juice, recheck BG 15 minutes after checking BG and if BG is <70 call Endo unit [facility]). Pt verbalized understanding of all pre-op instructions.

## 2016-05-24 NOTE — Telephone Encounter (Signed)
Wife left a message that they were confused about the biopsy and then a procedure tomorrow.  Looked through chart and then spoke with patient.  He is having an ERCP tomorrow.  Hopefully they will place a stent to enable his bilirubin to go down.  His CT biopsy is scheduled on 06/02/10.  Somehow the 3/8 appointment is on his schedule whereas the appointment for tomorrow is not.  I understand his confusion, but I am not sure why tomorrows appointment is not showing up, however there is documentation about it in his chart.

## 2016-05-25 ENCOUNTER — Observation Stay (HOSPITAL_COMMUNITY)
Admission: RE | Admit: 2016-05-25 | Discharge: 2016-05-26 | Disposition: A | Discharge status: Home / Self Care | Payer: Medicare Other | Source: Ambulatory Visit | Attending: Nephrology | Admitting: Nephrology

## 2016-05-25 ENCOUNTER — Encounter: Payer: Self-pay | Admitting: Hematology

## 2016-05-25 ENCOUNTER — Encounter (HOSPITAL_COMMUNITY)
Admission: RE | Disposition: A | Discharge status: Home / Self Care | Payer: Self-pay | Source: Ambulatory Visit | Attending: Nephrology

## 2016-05-25 ENCOUNTER — Ambulatory Visit (HOSPITAL_COMMUNITY): Payer: Medicare Other | Admitting: Certified Registered Nurse Anesthetist

## 2016-05-25 ENCOUNTER — Encounter (HOSPITAL_COMMUNITY): Payer: Self-pay

## 2016-05-25 ENCOUNTER — Ambulatory Visit (HOSPITAL_COMMUNITY): Payer: Medicare Other

## 2016-05-25 DIAGNOSIS — R079 Chest pain, unspecified: Secondary | ICD-10-CM | POA: Diagnosis not present

## 2016-05-25 DIAGNOSIS — Z8546 Personal history of malignant neoplasm of prostate: Secondary | ICD-10-CM | POA: Diagnosis not present

## 2016-05-25 DIAGNOSIS — R748 Abnormal levels of other serum enzymes: Secondary | ICD-10-CM | POA: Diagnosis present

## 2016-05-25 DIAGNOSIS — E785 Hyperlipidemia, unspecified: Secondary | ICD-10-CM | POA: Insufficient documentation

## 2016-05-25 DIAGNOSIS — E876 Hypokalemia: Secondary | ICD-10-CM | POA: Diagnosis not present

## 2016-05-25 DIAGNOSIS — K831 Obstruction of bile duct: Secondary | ICD-10-CM | POA: Diagnosis not present

## 2016-05-25 DIAGNOSIS — R7989 Other specified abnormal findings of blood chemistry: Secondary | ICD-10-CM

## 2016-05-25 DIAGNOSIS — Z7982 Long term (current) use of aspirin: Secondary | ICD-10-CM | POA: Diagnosis not present

## 2016-05-25 DIAGNOSIS — Z96642 Presence of left artificial hip joint: Secondary | ICD-10-CM | POA: Insufficient documentation

## 2016-05-25 DIAGNOSIS — E119 Type 2 diabetes mellitus without complications: Secondary | ICD-10-CM | POA: Insufficient documentation

## 2016-05-25 DIAGNOSIS — Z7984 Long term (current) use of oral hypoglycemic drugs: Secondary | ICD-10-CM | POA: Diagnosis not present

## 2016-05-25 DIAGNOSIS — C61 Malignant neoplasm of prostate: Secondary | ICD-10-CM | POA: Diagnosis not present

## 2016-05-25 DIAGNOSIS — Z87891 Personal history of nicotine dependence: Secondary | ICD-10-CM | POA: Diagnosis not present

## 2016-05-25 DIAGNOSIS — K859 Acute pancreatitis without necrosis or infection, unspecified: Secondary | ICD-10-CM

## 2016-05-25 DIAGNOSIS — C250 Malignant neoplasm of head of pancreas: Secondary | ICD-10-CM | POA: Insufficient documentation

## 2016-05-25 DIAGNOSIS — E871 Hypo-osmolality and hyponatremia: Secondary | ICD-10-CM | POA: Insufficient documentation

## 2016-05-25 DIAGNOSIS — H40009 Preglaucoma, unspecified, unspecified eye: Secondary | ICD-10-CM | POA: Insufficient documentation

## 2016-05-25 DIAGNOSIS — R948 Abnormal results of function studies of other organs and systems: Secondary | ICD-10-CM | POA: Insufficient documentation

## 2016-05-25 DIAGNOSIS — R945 Abnormal results of liver function studies: Secondary | ICD-10-CM

## 2016-05-25 DIAGNOSIS — I10 Essential (primary) hypertension: Secondary | ICD-10-CM | POA: Insufficient documentation

## 2016-05-25 DIAGNOSIS — Z9049 Acquired absence of other specified parts of digestive tract: Secondary | ICD-10-CM | POA: Diagnosis not present

## 2016-05-25 DIAGNOSIS — R109 Unspecified abdominal pain: Secondary | ICD-10-CM | POA: Diagnosis not present

## 2016-05-25 DIAGNOSIS — R933 Abnormal findings on diagnostic imaging of other parts of digestive tract: Secondary | ICD-10-CM | POA: Diagnosis not present

## 2016-05-25 DIAGNOSIS — Z79899 Other long term (current) drug therapy: Secondary | ICD-10-CM | POA: Insufficient documentation

## 2016-05-25 DIAGNOSIS — I160 Hypertensive urgency: Secondary | ICD-10-CM | POA: Insufficient documentation

## 2016-05-25 DIAGNOSIS — G8918 Other acute postprocedural pain: Secondary | ICD-10-CM

## 2016-05-25 HISTORY — PX: ERCP: SHX5425

## 2016-05-25 HISTORY — DX: Personal history of urinary calculi: Z87.442

## 2016-05-25 LAB — COMPREHENSIVE METABOLIC PANEL
ALBUMIN: 3.1 g/dL — AB (ref 3.5–5.0)
ALT: 230 U/L — AB (ref 17–63)
AST: 158 U/L — AB (ref 15–41)
Alkaline Phosphatase: 460 U/L — ABNORMAL HIGH (ref 38–126)
Anion gap: 12 (ref 5–15)
BILIRUBIN TOTAL: 20.4 mg/dL — AB (ref 0.3–1.2)
BUN: 14 mg/dL (ref 6–20)
CO2: 24 mmol/L (ref 22–32)
CREATININE: 0.92 mg/dL (ref 0.61–1.24)
Calcium: 9 mg/dL (ref 8.9–10.3)
Chloride: 99 mmol/L — ABNORMAL LOW (ref 101–111)
GFR calc Af Amer: >60 mL/min (ref >60)
GFR calc non Af Amer: >60 mL/min (ref >60)
GLUCOSE: 159 mg/dL — AB (ref 65–99)
POTASSIUM: 2.8 mmol/L — AB (ref 3.5–5.1)
Sodium: 135 mmol/L (ref 135–145)
TOTAL PROTEIN: 6.8 g/dL (ref 6.5–8.1)

## 2016-05-25 LAB — GLUCOSE, CAPILLARY
GLUCOSE-CAPILLARY: 244 mg/dL — AB (ref 65–99)
Glucose-Capillary: 133 mg/dL — ABNORMAL HIGH (ref 65–99)

## 2016-05-25 LAB — CBC
HEMATOCRIT: 34.8 % — AB (ref 39.0–52.0)
HEMOGLOBIN: 12.3 g/dL — AB (ref 13.0–17.0)
MCH: 27.3 pg (ref 26.0–34.0)
MCHC: 35.3 g/dL (ref 30.0–36.0)
MCV: 77.2 fL — AB (ref 78.0–100.0)
PLATELETS: 266 10*3/uL (ref 150–400)
RBC: 4.51 MIL/uL (ref 4.22–5.81)
RDW: 17.4 % — ABNORMAL HIGH (ref 11.5–15.5)
WBC: 10.4 10*3/uL (ref 4.0–10.5)

## 2016-05-25 LAB — AMYLASE: Amylase: 126 U/L — ABNORMAL HIGH (ref 28–100)

## 2016-05-25 LAB — LIPASE, BLOOD: Lipase: 18 U/L (ref 11–51)

## 2016-05-25 SURGERY — ERCP, WITH INTERVENTION IF INDICATED
Anesthesia: General

## 2016-05-25 MED ORDER — POTASSIUM CHLORIDE 20 MEQ/15ML (10%) PO SOLN
20.0000 meq | Freq: Two times a day (BID) | ORAL | Status: DC
  Administered 2016-05-25 – 2016-05-26 (×2): 20 meq via ORAL
  Filled 2016-05-25 (×2): qty 15

## 2016-05-25 MED ORDER — OCUVITE-LUTEIN PO CAPS
1.0000 | ORAL_CAPSULE | Freq: Every day | ORAL | Status: DC
  Filled 2016-05-25: qty 1

## 2016-05-25 MED ORDER — FENTANYL CITRATE (PF) 100 MCG/2ML IJ SOLN
INTRAMUSCULAR | Status: AC
Start: 2016-05-25 — End: 2016-05-25
  Filled 2016-05-25: qty 2

## 2016-05-25 MED ORDER — IOPAMIDOL (ISOVUE-300) INJECTION 61%
INTRAVENOUS | Status: AC
  Filled 2016-05-25: qty 50

## 2016-05-25 MED ORDER — FENTANYL CITRATE (PF) 100 MCG/2ML IJ SOLN
INTRAMUSCULAR | Status: DC | PRN
  Administered 2016-05-25 (×2): 50 ug via INTRAVENOUS

## 2016-05-25 MED ORDER — SODIUM CHLORIDE 0.9 % IV SOLN
1.5000 g | INTRAVENOUS | Status: AC
  Administered 2016-05-25: 1.5 g via INTRAVENOUS
  Filled 2016-05-25: qty 1.5

## 2016-05-25 MED ORDER — LACTATED RINGERS IV SOLN
INTRAVENOUS | Status: DC | PRN
  Administered 2016-05-25: 13:00:00 via INTRAVENOUS

## 2016-05-25 MED ORDER — SODIUM CHLORIDE 0.9 % IV SOLN: INTRAVENOUS | Status: DC

## 2016-05-25 MED ORDER — LACTATED RINGERS IV SOLN: INTRAVENOUS | Status: DC

## 2016-05-25 MED ORDER — IOPAMIDOL (ISOVUE-300) INJECTION 61%
INTRAVENOUS | Status: DC | PRN
  Administered 2016-05-25: 10 mL

## 2016-05-25 MED ORDER — INSULIN ASPART 100 UNIT/ML ~~LOC~~ SOLN
0.0000 [IU] | Freq: Three times a day (TID) | SUBCUTANEOUS | Status: DC
Start: 2016-05-26 — End: 2016-05-26
  Administered 2016-05-26: 1 [IU] via SUBCUTANEOUS

## 2016-05-25 MED ORDER — INDOMETHACIN 50 MG RE SUPP
RECTAL | Status: AC
  Filled 2016-05-25: qty 2

## 2016-05-25 MED ORDER — GLUCAGON HCL RDNA (DIAGNOSTIC) 1 MG IJ SOLR
INTRAMUSCULAR | Status: DC | PRN
  Administered 2016-05-25: 0.25 mg via INTRAVENOUS

## 2016-05-25 MED ORDER — PROPOFOL 10 MG/ML IV BOLUS
INTRAVENOUS | Status: DC | PRN
  Administered 2016-05-25: 120 mg via INTRAVENOUS

## 2016-05-25 MED ORDER — INDOMETHACIN 50 MG RE SUPP: 100.0000 mg | Freq: Once | RECTAL | Status: AC

## 2016-05-25 MED ORDER — FENTANYL CITRATE (PF) 100 MCG/2ML IJ SOLN
50.0000 ug | Freq: Once | INTRAMUSCULAR | Status: AC
  Administered 2016-05-25: 50 ug via INTRAVENOUS

## 2016-05-25 MED ORDER — PHENYLEPHRINE 40 MCG/ML (10ML) SYRINGE FOR IV PUSH (FOR BLOOD PRESSURE SUPPORT)
PREFILLED_SYRINGE | INTRAVENOUS | Status: DC | PRN
  Administered 2016-05-25 (×3): 120 ug via INTRAVENOUS

## 2016-05-25 MED ORDER — HYDROMORPHONE HCL 1 MG/ML IJ SOLN
0.5000 mg | INTRAMUSCULAR | Status: DC | PRN
Start: 2016-05-25 — End: 2016-05-25
  Administered 2016-05-25: 0.5 mg via INTRAVENOUS

## 2016-05-25 MED ORDER — LATANOPROST 0.005 % OP SOLN
1.0000 [drp] | Freq: Every day | OPHTHALMIC | Status: DC
  Administered 2016-05-25: 1 [drp] via OPHTHALMIC
  Filled 2016-05-25: qty 2.5

## 2016-05-25 MED ORDER — INDOMETHACIN 50 MG RE SUPP
RECTAL | Status: DC | PRN
  Administered 2016-05-25: 100 mg via RECTAL

## 2016-05-25 MED ORDER — SUCCINYLCHOLINE CHLORIDE 200 MG/10ML IV SOSY
PREFILLED_SYRINGE | INTRAVENOUS | Status: DC | PRN
  Administered 2016-05-25: 50 mg via INTRAVENOUS

## 2016-05-25 MED ORDER — HYDROMORPHONE HCL 2 MG/ML IJ SOLN
0.5000 mg | INTRAMUSCULAR | Status: DC | PRN
  Administered 2016-05-25: 0.5 mg via INTRAVENOUS
  Administered 2016-05-26: 1 mg via INTRAVENOUS
  Administered 2016-05-26: 0.5 mg via INTRAVENOUS
  Filled 2016-05-25 (×3): qty 1

## 2016-05-25 MED ORDER — MIDAZOLAM HCL 2 MG/2ML IJ SOLN
INTRAMUSCULAR | Status: DC | PRN
  Administered 2016-05-25: 2 mg via INTRAVENOUS

## 2016-05-25 MED ORDER — HYDRALAZINE HCL 20 MG/ML IJ SOLN: 5.0000 mg | Freq: Four times a day (QID) | INTRAMUSCULAR | Status: DC | PRN

## 2016-05-25 MED ORDER — GLUCAGON HCL RDNA (DIAGNOSTIC) 1 MG IJ SOLR
INTRAMUSCULAR | Status: AC
  Filled 2016-05-25: qty 1

## 2016-05-25 MED ORDER — AMLODIPINE BESYLATE 10 MG PO TABS
10.0000 mg | ORAL_TABLET | Freq: Every day | ORAL | Status: DC
  Administered 2016-05-26: 10 mg via ORAL
  Filled 2016-05-25: qty 1

## 2016-05-25 MED ORDER — LACTATED RINGERS IV SOLN
INTRAVENOUS | Status: DC
  Administered 2016-05-25 – 2016-05-26 (×4): via INTRAVENOUS

## 2016-05-25 MED ORDER — HYDROMORPHONE HCL 1 MG/ML IJ SOLN
INTRAMUSCULAR | Status: AC
  Filled 2016-05-25: qty 1

## 2016-05-25 MED ORDER — DOCUSATE SODIUM 100 MG PO CAPS: 100.0000 mg | ORAL_CAPSULE | Freq: Two times a day (BID) | ORAL | Status: DC | PRN

## 2016-05-25 MED ORDER — DOXAZOSIN MESYLATE 1 MG PO TABS
1.0000 mg | ORAL_TABLET | Freq: Every day | ORAL | Status: DC
  Administered 2016-05-26: 1 mg via ORAL
  Filled 2016-05-25: qty 1

## 2016-05-25 MED ORDER — PRAVASTATIN SODIUM 20 MG PO TABS
20.0000 mg | ORAL_TABLET | Freq: Every day | ORAL | Status: DC
  Administered 2016-05-25: 20 mg via ORAL
  Filled 2016-05-25 (×2): qty 1

## 2016-05-25 MED ORDER — SODIUM CHLORIDE 0.9 % IV SOLN
30.0000 meq | Freq: Once | INTRAVENOUS | Status: AC
  Administered 2016-05-25: 30 meq via INTRAVENOUS
  Filled 2016-05-25: qty 15

## 2016-05-25 MED ORDER — ONDANSETRON HCL 4 MG/2ML IJ SOLN
INTRAMUSCULAR | Status: DC | PRN
  Administered 2016-05-25: 4 mg via INTRAVENOUS

## 2016-05-25 MED ORDER — TRAMADOL HCL 50 MG PO TABS
25.0000 mg | ORAL_TABLET | Freq: Four times a day (QID) | ORAL | Status: DC | PRN
  Administered 2016-05-25: 25 mg via ORAL
  Filled 2016-05-25 (×3): qty 1

## 2016-05-25 NOTE — Discharge Instructions (Signed)
YOU HAD AN ENDOSCOPIC PROCEDURE TODAY: Refer to the procedure report and other information in the discharge instructions given to you for any specific questions about what was found during the examination. If this information does not answer your questions, please call [place] office at [phone] to clarify.  ° °YOU SHOULD EXPECT: Some feelings of bloating in the abdomen. Passage of more gas than usual. Walking can help get rid of the air that was put into your GI tract during the procedure and reduce the bloating. If you had a lower endoscopy (such as a colonoscopy or flexible sigmoidoscopy) you may notice spotting of blood in your stool or on the toilet paper. Some abdominal soreness may be present for a day or two, also. ° °DIET: Your first meal following the procedure should be a light meal and then it is ok to progress to your normal diet. A half-sandwich or bowl of soup is an example of a good first meal. Heavy or fried foods are harder to digest and may make you feel nauseous or bloated. Drink plenty of fluids but you should avoid alcoholic beverages for 24 hours. If you had a esophageal dilation, please see attached instructions for diet.   ° °ACTIVITY: Your care partner should take you home directly after the procedure. You should plan to take it easy, moving slowly for the rest of the day. You can resume normal activity the day after the procedure however YOU SHOULD NOT DRIVE, use power tools, machinery or perform tasks that involve climbing or major physical exertion for 24 hours (because of the sedation medicines used during the test).  ° °SYMPTOMS TO REPORT IMMEDIATELY: °A gastroenterologist can be reached at any hour. Please call [phone]  for any of the following symptoms:  °Following lower endoscopy (colonoscopy, flexible sigmoidoscopy) °Excessive amounts of blood in the stool  °Significant tenderness, worsening of abdominal pains  °Swelling of the abdomen that is new, acute  °Fever of 100° or  higher  °Following upper endoscopy (EGD, EUS, ERCP, esophageal dilation) °Vomiting of blood or coffee ground material  °New, significant abdominal pain  °New, significant chest pain or pain under the shoulder blades  °Painful or persistently difficult swallowing  °New shortness of breath  °Black, tarry-looking or red, bloody stools ° °FOLLOW UP:  °If any biopsies were taken you will be contacted by phone or by letter within the next 1-3 weeks. Call [phone]  if you have not heard about the biopsies in 3 weeks.  °Please also call with any specific questions about appointments or follow up tests. ° °

## 2016-05-25 NOTE — Anesthesia Preprocedure Evaluation (Signed)
Anesthesia Evaluation  Patient identified by MRN, date of birth, ID band Patient awake    Reviewed: Allergy & Precautions, NPO status , Patient's Chart, lab work & pertinent test results  History of Anesthesia Complications (+) PONV  Airway Mallampati: I  TM Distance: >3 FB     Dental  (+) Teeth Intact   Pulmonary former smoker,    breath sounds clear to auscultation       Cardiovascular hypertension,  Rhythm:Regular Rate:Normal     Neuro/Psych    GI/Hepatic GERD  ,  Endo/Other  diabetes  Renal/GU      Musculoskeletal  (+) Arthritis ,   Abdominal   Peds  Hematology   Anesthesia Other Findings   Reproductive/Obstetrics                             Anesthesia Physical Anesthesia Plan  ASA: III  Anesthesia Plan: General   Post-op Pain Management:    Induction: Intravenous  Airway Management Planned: Oral ETT  Additional Equipment:   Intra-op Plan:   Post-operative Plan: Extubation in OR  Informed Consent: I have reviewed the patients History and Physical, chart, labs and discussed the procedure including the risks, benefits and alternatives for the proposed anesthesia with the patient or authorized representative who has indicated his/her understanding and acceptance.   Dental advisory given  Plan Discussed with: CRNA  Anesthesia Plan Comments:         Anesthesia Quick Evaluation

## 2016-05-25 NOTE — Anesthesia Postprocedure Evaluation (Addendum)
Anesthesia Post Note  Patient: [name]  Procedure(s) Performed: Procedure(s) (LRB): ENDOSCOPIC RETROGRADE CHOLANGIOPANCREATOGRAPHY (ERCP) (N/A)  Patient location during evaluation: Endoscopy Anesthesia Type: General Level of consciousness: awake and alert Pain management: satisfactory to patient (Pain Rx'd) Vital Signs Assessment: post-procedure vital signs reviewed and stable Respiratory status: spontaneous breathing, nonlabored ventilation, respiratory function stable and patient connected to nasal cannula oxygen Cardiovascular status: blood pressure returned to baseline and stable Postop Assessment: no signs of nausea or vomiting Anesthetic complications: no       Last Vitals:  Vitals:   05/25/16 1350 05/25/16 1400  BP: (!) 183/65 (!) 174/62  Pulse:  80  Resp: 16 13  Temp:      Last Pain:  Vitals:   05/25/16 1430  TempSrc:   PainSc: 7                  [name],JAMES TERRILL

## 2016-05-25 NOTE — Transfer of Care (Signed)
Immediate Anesthesia Transfer of Care Note  Patient: [name]  Procedure(s) Performed: Procedure(s): ENDOSCOPIC RETROGRADE CHOLANGIOPANCREATOGRAPHY (ERCP) (N/A)  Patient Location: Endoscopy Unit  Anesthesia Type:General  Level of Consciousness: sedated and patient cooperative  Airway & Oxygen Therapy: Patient Spontanous Breathing and Patient connected to face mask oxygen  Post-op Assessment: Report given to RN, Post -op Vital signs reviewed and stable and Patient moving all extremities X 4  Post vital signs: Reviewed and stable  Last Vitals:  Vitals:   05/25/16 1107  BP: (!) 155/79  Pulse: 79  Resp: 14    Last Pain:  Vitals:   05/25/16 1107  TempSrc: Oral  PainSc: 5          Complications: No apparent anesthesia complications

## 2016-05-25 NOTE — Anesthesia Procedure Notes (Signed)
Procedure Name: Intubation Date/Time: 05/25/2016 12:57 PM Performed by: Mervyn Gay Pre-anesthesia Checklist: Patient identified, Patient being monitored, Timeout performed, Emergency Drugs available and Suction available Patient Re-evaluated:Patient Re-evaluated prior to inductionOxygen Delivery Method: Circle System Utilized Preoxygenation: Pre-oxygenation with 100% oxygen Intubation Type: IV induction Ventilation: Mask ventilation without difficulty Laryngoscope Size: Miller and 3 Grade View: Grade I Tube type: Oral Tube size: 7.5 mm Number of attempts: 1 Airway Equipment and Method: Stylet Placement Confirmation: ETT inserted through vocal cords under direct vision,  positive ETCO2 and breath sounds checked- equal and bilateral Secured at: 23 cm Tube secured with: Tape Dental Injury: Teeth and Oropharynx as per pre-operative assessment

## 2016-05-25 NOTE — Progress Notes (Signed)
Received PA request for Lidocaine-Prilocaine. Submitted via Cover My Meds.  Garald Reineck (Key: [facility])   Your information has been submitted to [place] [employer]. [employer] will review the request and will issue a decision, typically within 1-3 days from your submission. You can check the updated outcome later by reopening this request. If [employer] has not responded in 1-3 days or if you have any questions about your ePA request, please contact [place] [employer] Part D at [phone]. If you think there may be a problem with your PA request, use our live chat feature at the bottom right.

## 2016-05-25 NOTE — H&P (View-Only) (Signed)
[name]  Telephone:(336) [phone] Fax:(336) [phone]  Clinic follow up Note   Patient Care Team: Binnie Rail, MD as PCP - General (Internal Medicine) [name], MD as Consulting Physician (General Surgery) 05/23/2016  CHIEF COMPLAINTS:  Follow-up pancreatic cancer  Oncology History   Primary pancreatic cancer [facility])   Staging form: Pancreas, AJCC 7th Edition     Clinical stage from 09/30/2015: Stage IIB (T2, N1, M0) - Signed by Truitt Merle, MD on 10/07/2015       Malignant neoplasm of body of pancreas ([place])   09/30/2015 Initial Diagnosis    Primary pancreatic cancer ([place])      09/30/2015 Initial Biopsy     endoscopic pancreatic body mass fine-needle aspiration show malignant cells consistent with adenocarcinoma.      09/30/2015 Procedure     EUS showe a 2.5 cm pancreatic mass causing a stream the dictation of the main pancreatic duct, and directly abutting the SMA an      10/06/2015 Imaging     CT chest, abdomen and  pelvis showed a  4 cm mass in the body of  pancreas, tumor abut the anterior wall of the SMA, a 1.4cm  he pedal duoligament lymph nodes is suspicious, 70m RUL lung nodule is nonspecific, no other distant mets      10/15/2015 -  Chemotherapy    neoadjuvant chemo gemcitabine 10075mm2 and abraxane 12048m2, on day 1, 8 every 21 days, held after 11/30/2015 due to radiation      01/12/2016 - 01/21/2016 Radiation Therapy    SBRT to pancreatic cancer, 33 Gy in 5 fractions        05/19/2016 Imaging    CT CAP w contrast Interval development of multiple soft tissue nodules within the anterior upper abdomen involving the right and left upper quadrants compatible with peritoneal metastatic disease. There is a small amount of new right perihepatic free fluid. Interval development of intrahepatic biliary ductal dilatation as well as dilation of the gallbladder. There is tapering of the central bile ducts at the level of soft tissue heterogeneity near the porta  hepatis which may represent a metastatic soft tissue mass or liver lesion. Slight interval increase in size of hypoenhancing pancreatic body mass with surrounding infiltrative tissue. Aortic atherosclerosis.  Sigmoid colonic diverticulosis.       HISTORY OF PRESENTING ILLNESS:  [name] 81o. male is here because of His recently diagnosed pancreatic cancer. He presents to the clinic with his wife today.  He has been having abdominal pain for the past one month, worst in the past few weeks, radiates to back, 3-5/10, intermittent, worse with empty stomack, no nausea, bloating, BM is slightly constipated, once a daily. No melana or hematochezia. He has good appetie and eats well, mild fatigue, he is aboe to do his daily activities without difficulty, such as morning his long. He also exercise regularly, bikes a few miles a day. No significant weight loss recently.  He was seen by his primary care physician, and a CT chest and abdomen was done on 09/20/2015 which showed a 2.7 x 2.5 cm mass in the pancreatic body with associated pancreatic duct dilatation. He was referred to see GI Dr. JacArdis Pugh underwent EGD and EUS, backward a mass fine-needle biopsy, which showed adenocarcinoma.  He was diagnosed with prosttae can 5 years ago, has been monitored, no treatment, he has low PSA level.  He is diabetic, he is to be on metformin, has been off for a  while because his sugar has been relatively controlled with diet alone. His recent HbA1c was some 7.9 in 05/2015.   CURRENT THERAPY: observation   INTERIM HISTORY: [name] came in for follow up. He has developed some symptoms lately. He has constant abdominal discomfort, but not pain. This has been going on for the past month. It is mostly in the mid part of his stomach, and causes some nausea. He itches everywhere from his waist up. His urine is dark, but not tea colored. His appetite has been getting worse. He doesn't have a desire to eat and can't  eat much. He does get sharp hunger pains around lunch time, and the abdominal discomfort is worse after he eats. He has lost 10 lbs in the last month. Denies any other concerns.   MEDICAL HISTORY:  Past Medical History:  Diagnosis Date  . Arthritis    R hip and shoulder  . Complication of anesthesia    Local anesthetic used in office for Prostate biopsy" reaction Tachycardia,nausea, hallucinations, Blood pressure elevated"- No problems once done at hospital with anesthesia" Thinks Ciprofloxacin was the injection"   . Diabetes mellitus   . Enlarged prostate   . Femoral bruit    R femoral  . GERD with stricture    PMH of  . Hyperlipidemia   . Hypertension   . Macular degeneration    glaucoma suspect  . Pancreatic cancer ([place]) 09/2015   adenocarcinoma of pancreas  . PONV (postoperative nausea and vomiting)   . Prostate cancer [facility])    prostate ; Dr Wyvonnia Lora to be monitored x 5 yrs now.  . Squamous papilloma    of esophagus    SURGICAL HISTORY: Past Surgical History:  Procedure Laterality Date  . ANKLE FUSION  2004  . COLONOSCOPY  05/2012  . esophageal dilation  2002   Dr  Lucio Edward  . ESOPHAGOGASTRODUODENOSCOPY (EGD) WITH PROPOFOL N/A 09/30/2015   Procedure: ESOPHAGOGASTRODUODENOSCOPY (EGD) WITH PROPOFOL;  Surgeon: Milus Banister, MD;  Location: [facility];  Service: Endoscopy;  Laterality: N/A;  . EUS N/A 09/30/2015   Procedure: UPPER ENDOSCOPIC ULTRASOUND (EUS) RADIAL;  Surgeon: Milus Banister, MD;  Location: [facility];  Service: Endoscopy;  Laterality: N/A;  . FINE NEEDLE ASPIRATION N/A 09/30/2015   Procedure: FINE NEEDLE ASPIRATION (FNA) LINEAR;  Surgeon: Milus Banister, MD;  Location: [facility];  Service: Endoscopy;  Laterality: N/A;  . HERNIA REPAIR     bil inguinal hernias  . PARTIAL COLECTOMY     perforated diverticulitis  . PORTACATH PLACEMENT Left 10/13/2015   Procedure: INSERTION PORT-A-CATH;  Surgeon: [name], MD;  Location: [facility];   Service: General;  Laterality: Left;  . PROSTATE BIOPSY  03/29/2012   Procedure: BIOPSY TRANSRECTAL ULTRASONIC PROSTATE (TUBP);  Surgeon: Fredricka Bonine, MD;  [facility];  Service: Urology;  Laterality: N/A;  . ROTATOR CUFF REPAIR  2006  . TOTAL HIP ARTHROPLASTY Left 2012    SOCIAL HISTORY: Social History   Social History  . Marital status: Married    Spouse name: Deloris  . Number of children: 1  . Years of education: N/A   Occupational History  . Retired     Ecologist in [place]  . Smoking status: Former Smoker    Packs/day: 0.50    Years: 40.00    Types: Cigarettes    Quit date: 03/27/1989  . Smokeless tobacco: Never Used     Comment: smoked 1955-  1991, up to 1 ppd  . Alcohol use No  . Drug use: No  . Sexual activity: Not on file   Other Topics Concern  . 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 He and his wife moved to [place] to be closer to his son.  He has one son and one Granddaughter who is 42.  FAMILY HISTORY: Family History  Problem Relation Age of Onset  . Kidney disease Father     ? Rheumatic fever as child  . Hypertension Father   . Heart attack Paternal Uncle     in 54s  . COPD Paternal Uncle     due to exposures in [facility]  . Kidney disease Paternal Aunt     ? Rheumatic fever as child  . Cancer Neg Hx   . Diabetes Neg Hx   . Stroke Neg Hx   . Colon cancer Neg Hx     ALLERGIES:  is allergic to ace inhibitors; angiotensin receptor blockers; and ciprofloxacin.  MEDICATIONS:  Current Outpatient Prescriptions  Medication Sig Dispense Refill  . amLODipine (NORVASC) 10 MG tablet Take 1 tablet (10 mg total) by mouth daily. 90  tablet 3  . aspirin 81 MG tablet Take 1 tablet (81 mg total) by mouth daily. 30 tablet   . doxazosin (CARDURA) 1 MG tablet TAKE 1 TABLET BY MOUTH EVERY DAY 90 tablet 2  . hydrochlorothiazide (MICROZIDE) 12.5 MG capsule Take 1 capsule (12.5 mg total) by mouth daily. 90 capsule 3  . latanoprost (XALATAN) 0.005 % ophthalmic solution Place 1 drop into both eyes at bedtime.  1  . lidocaine-prilocaine (EMLA) cream Apply 1 application topically as needed. Apply to pac site 1 hour prior to stick and cover with plastic wrap 30 g 11  . metFORMIN (GLUCOPHAGE) 500 MG tablet Take 1 tablet (500 mg total) by mouth 2 (two) times daily with a meal. 180 tablet 3  . multivitamin-lutein (OCUVITE-LUTEIN) CAPS Take 1 capsule by mouth daily.    Glory Rosebush DELICA LANCETS 16X MISC Use to check blood sugars once a day Dx E11.9 100 each 3  . ONETOUCH VERIO test strip CHECK BLOOD SUGAR ONCE DAILY. DX250.00 100 each 5  . ONETOUCH VERIO test strip CHECK BLOOD SUGAR ONCE DAILY 100 each 3  . pravastatin (PRAVACHOL) 20 MG tablet Take 1 tablet (20 mg total) by mouth at bedtime. 90 tablet 3  . traMADol (ULTRAM) 50 MG tablet Take 0.5 tablets (25 mg total) by mouth every 6 (six) hours as needed. 30 tablet 0   No current facility-administered medications for this visit.     REVIEW OF SYSTEMS:   Constitutional: Denies fevers, chills or abnormal night sweats (+) loss of appetite, weight loss (10 lbs) Eyes: Denies blurriness of vision, double vision or watery eyes Ears, nose, mouth, throat, and face: Denies mucositis or sore throat Respiratory: Denies cough, dyspnea or wheezes Cardiovascular: Denies palpitation, chest discomfort or lower extremity swelling Gastrointestinal:  Denies nausea, heartburn or change in bowel habits (+) abdominal discomfort, nausea GU: (+) dark urine Skin: Denies abnormal skin rashes (+) itching on upper body Lymphatics: Denies new lymphadenopathy or easy bruising Neurological:Denies numbness, tingling  or  new weaknesses Behavioral/Psych: Mood is stable, no new changes  All other systems were reviewed with the patient and are negative.  PHYSICAL EXAMINATION: ECOG PERFORMANCE STATUS: 1  BP (!) 146/78 (BP Location: Left Arm, Patient Position: Sitting)   Pulse 87   Temp 98.1 F (36.7 C) (Oral)   Resp 18   Ht 5' 10" (1.778 m)   Wt 127 lb 11.2 oz (57.9 kg)   SpO2 99%   BMI 18.32 kg/m   GENERAL:alert, no distress and comfortable SKIN: skin texture, turgor are normal, no rashes or significant lesions (+) jaundice  EYES: normal, conjunctiva are pink and non-injected, sclera clear OROPHARYNX:no exudate, no erythema and lips, buccal mucosa, and tongue normal  NECK: supple, thyroid normal size, non-tender, without nodularity LYMPH:  no palpable lymphadenopathy in the cervical, axillary or inguinal LUNGS: clear to auscultation and percussion with normal breathing effort HEART: regular rate & rhythm and no murmurs and no lower extremity edema ABDOMEN:abdomen soft, non-tender and normal bowel sounds Musculoskeletal:no cyanosis of digits and no clubbing  PSYCH: alert & oriented x 3 with fluent speech NEURO: no focal motor/sensory deficits EXT: no edema   LABORATORY DATA:  I have reviewed the data as listed CBC Latest Ref Rng & Units 05/23/2016 04/25/2016 03/23/2016  WBC 4.0 - 10.3 10e3/uL 7.0 6.3 6.3  Hemoglobin 13.0 - 17.1 g/dL 12.6(L) 14.3 15.0  Hematocrit 38.4 - 49.9 % 36.2(L) 42.8 45.9  Platelets 140 - 400 10e3/uL 247 178 189   CMP Latest Ref Rng & Units 05/23/2016 04/25/2016 03/23/2016  Glucose 70 - 140 mg/dl 232(H) 147(H) 126  BUN 7.0 - 26.0 mg/dL 18.6 16.0 12.9  Creatinine 0.7 - 1.3 mg/dL 1.3 0.9 1.0  Sodium 136 - 145 mEq/L 135(L) 141 142  Potassium 3.5 - 5.1 mEq/L 3.4(L) 3.6 3.3(L)  Chloride 96 - 112 mEq/L - - -  CO2 22 - 29 mEq/L _0 Calcium 8.4 - 10.4 mg/dL 9.4 9.7 9.5  Total Protein 6.4 - 8.3 g/dL 7.0 7.6 8.0  Total Bilirubin 0.20 - 1.20 mg/dL 16.32(HH) 0.47 0.40    Alkaline Phos 40 - 150 U/L 491(H) 79 90  AST 5 - 34 U/L 156(H) 23 28  ALT 0 - 55 U/L 279(HH) 19 22   CA19.9 (0-35U/ml) 09/30/2015: 370 10/07/2015: 516 10/29/2015: 381 11/26/2015: 84 01/27/2016: 13 02/25/2016: 22 04/25/2016: 314  PATHOLOGY REPORT Diagnosis 09/30/2015 FINE NEEDLE ASPIRATION, ENDOSCOPIC PANCREATIC BODY (SPECIMEN 1 OF 1 COLLECTED 09/30/2015) MALIGNANT CELLS PRESENT, CONSISTENT WITH ADENOCARCINOMA. SEE COMMENT. COMMENT: THE CASE WAS DISCUSSED WITH DR. Ardis Pugh ON 10/01/2015.  RADIOGRAPHIC STUDIES: I have personally reviewed the radiological images as listed and agreed with the findings in the report. Ct Chest W Contrast  Result Date: 05/19/2016 CLINICAL DATA:  Patient with history of pancreatic cancer. Diffuse abdominal pain. EXAM: CT CHEST, ABDOMEN, AND PELVIS WITH CONTRAST TECHNIQUE: Multidetector CT imaging of the chest, abdomen and pelvis was performed following the standard protocol during bolus administration of intravenous contrast. CONTRAST:  130m ISOVUE-300 IOPAMIDOL (ISOVUE-300) INJECTION 61% COMPARISON:  CT chest abdomen pelvis 03/23/2016. FINDINGS: CT CHEST FINDINGS Cardiovascular: Left anterior chest wall Port-A-Cath is present with tip terminating in the superior vena cava. Mediastinum/Nodes: No enlarged axillary, mediastinal or hilar lymphadenopathy. The esophagus is mildly patulous. Lungs/Pleura: Central airways are patent. Dependent atelectasis and or scarring within the lower lobes bilaterally. No large area pulmonary consolidation. Centrilobular and paraseptal emphysematous change. 5 mm calcified granuloma left upper lobe. No pleural effusion or pneumothorax. Musculoskeletal: No aggressive or acute appearing  osseous lesions. CT ABDOMEN PELVIS FINDINGS Hepatobiliary: Liver is normal in size and contour. Interval development of intrahepatic biliary ductal dilatation. The gallbladder is distended. There is focal tapering of the bile ducts centrally within the liver were there is  approximately 2.5 cm of abnormal appearing soft tissue in the porta hepatis (image 107; series 7). Pancreas: Two metallic fiducial markers re- demonstrated within the pancreatic body. Hypoenhancing pancreatic mass appears slightly increased when compared to prior exam measuring 2.5 x 1.4 cm (image 109; series 7), previously 1.6 x 2.0 cm. Upstream pancreatic ductal dilatation is similar. Stable infiltrative soft tissue extending superiorly from the pancreatic body involve the proximal splenic artery and involve the proximal superior mesenteric artery as well as common hepatic artery. Spleen: Unremarkable. Adrenals/Urinary Tract: The adrenal glands are normal. Kidneys enhance symmetrically with contrast. Stable bilateral renal cysts. No hydronephrosis. Urinary bladder is decompressed. Stomach/Bowel: Sigmoid colonic diverticulosis. No CT evidence for acute diverticulitis. No abnormal bowel wall thickening or evidence for bowel obstruction. Small amount of perihepatic free fluid, new from prior. Vascular/Lymphatic: Peripheral calcified atherosclerotic plaque involving the abdominal aorta. No retroperitoneal lymphadenopathy. Marked narrowing of the splenic vein near the porta splenic confluence. Reproductive: Enlarged prostate gland. Other: Interval development of multiple peritoneal soft tissue nodules within the anterior abdomen with a reference 1.4 cm nodule within the anterior right upper abdomen (image 124; series 7) and a reference 1.6 cm nodule within the anterior left upper abdomen (image 119; series 7). There is a 1.7 cm nodule anterior to the spleen (image 94; series 7). Musculoskeletal: Lumbar spine degenerative changes. Postsurgical changes proximal left femur. IMPRESSION: Interval development of multiple soft tissue nodules within the anterior upper abdomen involving the right and left upper quadrants compatible with peritoneal metastatic disease. There is a small amount of new right perihepatic free fluid.  Interval development of intrahepatic biliary ductal dilatation as well as dilation of the gallbladder. There is tapering of the central bile ducts at the level of soft tissue heterogeneity near the porta hepatis which may represent a metastatic soft tissue mass or liver lesion. Slight interval increase in size of hypoenhancing pancreatic body mass with surrounding infiltrative tissue. Aortic atherosclerosis.  Sigmoid colonic diverticulosis. Electronically Signed   By: Lovey Newcomer M.D.   On: 05/19/2016 10:47   Ct Abdomen Pelvis W Contrast  Result Date: 05/19/2016 CLINICAL DATA:  Patient with history of pancreatic cancer. Diffuse abdominal pain. EXAM: CT CHEST, ABDOMEN, AND PELVIS WITH CONTRAST TECHNIQUE: Multidetector CT imaging of the chest, abdomen and pelvis was performed following the standard protocol during bolus administration of intravenous contrast. CONTRAST:  146m ISOVUE-300 IOPAMIDOL (ISOVUE-300) INJECTION 61% COMPARISON:  CT chest abdomen pelvis 03/23/2016. FINDINGS: CT CHEST FINDINGS Cardiovascular: Left anterior chest wall Port-A-Cath is present with tip terminating in the superior vena cava. Mediastinum/Nodes: No enlarged axillary, mediastinal or hilar lymphadenopathy. The esophagus is mildly patulous. Lungs/Pleura: Central airways are patent. Dependent atelectasis and or scarring within the lower lobes bilaterally. No large area pulmonary consolidation. Centrilobular and paraseptal emphysematous change. 5 mm calcified granuloma left upper lobe. No pleural effusion or pneumothorax. Musculoskeletal: No aggressive or acute appearing osseous lesions. CT ABDOMEN PELVIS FINDINGS Hepatobiliary: Liver is normal in size and contour. Interval development of intrahepatic biliary ductal dilatation. The gallbladder is distended. There is focal tapering of the bile ducts centrally within the liver were there is approximately 2.5 cm of abnormal appearing soft tissue in the porta hepatis (image 107; series 7).  Pancreas: Two metallic fiducial markers re- demonstrated within  the pancreatic body. Hypoenhancing pancreatic mass appears slightly increased when compared to prior exam measuring 2.5 x 1.4 cm (image 109; series 7), previously 1.6 x 2.0 cm. Upstream pancreatic ductal dilatation is similar. Stable infiltrative soft tissue extending superiorly from the pancreatic body involve the proximal splenic artery and involve the proximal superior mesenteric artery as well as common hepatic artery. Spleen: Unremarkable. Adrenals/Urinary Tract: The adrenal glands are normal. Kidneys enhance symmetrically with contrast. Stable bilateral renal cysts. No hydronephrosis. Urinary bladder is decompressed. Stomach/Bowel: Sigmoid colonic diverticulosis. No CT evidence for acute diverticulitis. No abnormal bowel wall thickening or evidence for bowel obstruction. Small amount of perihepatic free fluid, new from prior. Vascular/Lymphatic: Peripheral calcified atherosclerotic plaque involving the abdominal aorta. No retroperitoneal lymphadenopathy. Marked narrowing of the splenic vein near the porta splenic confluence. Reproductive: Enlarged prostate gland. Other: Interval development of multiple peritoneal soft tissue nodules within the anterior abdomen with a reference 1.4 cm nodule within the anterior right upper abdomen (image 124; series 7) and a reference 1.6 cm nodule within the anterior left upper abdomen (image 119; series 7). There is a 1.7 cm nodule anterior to the spleen (image 94; series 7). Musculoskeletal: Lumbar spine degenerative changes. Postsurgical changes proximal left femur. IMPRESSION: Interval development of multiple soft tissue nodules within the anterior upper abdomen involving the right and left upper quadrants compatible with peritoneal metastatic disease. There is a small amount of new right perihepatic free fluid. Interval development of intrahepatic biliary ductal dilatation as well as dilation of the  gallbladder. There is tapering of the central bile ducts at the level of soft tissue heterogeneity near the porta hepatis which may represent a metastatic soft tissue mass or liver lesion. Slight interval increase in size of hypoenhancing pancreatic body mass with surrounding infiltrative tissue. Aortic atherosclerosis.  Sigmoid colonic diverticulosis. Electronically Signed   By: Lovey Newcomer M.D.   On: 05/19/2016 10:47   ASSESSMENT & PLAN: 81 y.o. African-American male, presented with epigastric pain for a month.  1. Primary pancreatic cancer, adenocarcinoma in the body of pancreas,cT2N1M0, stage IIB, probable unresectable, peritoneal metastasis in 04/2016 -I previously reviewed his CT scan findings, EUS findings and pancreatic mass biopsy pathology results with pt and his wife in details. -His initial CT scan showed no evidence of distant metastasis, there is a 14 mm hepatoduodenal node on CT which is suspicious for nodal metastasis, EUS was negative for peripancreatic adenopathy.  -he has completed 2 cycles of Abraxane and gemcitabine, followed by radiation, tolerated very well. -unfortunately his recent CT scan from 05/19/2016 showed multiple peritoneal masses, his CEA has significantly increased, he also developed abdominal discomfort, this is highly suspicious for metastasis -I recommend him to have needle biopsy of his peritoneal mass and get tissue for genomic testing such as MSI.  -due to his high Tbili from obstruction, I will refer him back to GI for ERCP and stenting  -I recommend him to restart chemo gemcitabine and Abraxane, which he received about 6-7 months ago with good response and tolerance after his stent placement  -the goal of therapy is palliative  2. Obstructive jaundice  -secondary to pancreatic mass -CT scan showed no liver mets  -I spoke with GI Drs. [name] and stark, who saw him before. They will get him in ASAP for ERCP   3. Abdominal discomfort  -I gave him a  prescription of tramadol to use as need, I recommend him to avoid tylenol and NSAIDs for now   4. DM -He takes Metformin  twice daily and checks his sugar once daily -He will continue follow-up with his primary care physician  5. Prostate cancer, untreated  -His prostate cancer has been monitored by his urologist, treatment was not recommended -will follow up clinically    6. HTN -Continue medication, follow up with PCP  5. Abdominal Discomfort -I have recommended a mild pain medication like Aleve. -Prescribe Tramadol.   7. Anorexia and Weight Loss -Lost 10 lbs in the last month.  -He had diarrhea with Ensure.  -I have recommended that he try Boost or other nutrient supplements -follow up with dietitian    Plan -Ordered needle biopsy of his peritoneal mass by IR  -Prescribe Tramadol  -Dr. Lynne Leader office will call him to schedule his ERCP  -he will need port flushed today and I'll schedule port flush every 4 weeks -Lab and follow-up in 2 weeks and restart chemotherapy in 2 weeks with gemcitabine and abraxane, after his biopsy.   All questions were answered. The patient knows to call the clinic with any problems, questions or concerns.  I spent 25 minutes counseling the patient face to face. The total time spent in the appointment was 30 minutes and more than 50% was on counseling.  This document serves as a record of services personally performed by Truitt Merle, MD. It was created on her behalf by Martinique Casey, a trained medical scribe. The creation of this record is based on the scribe's personal observations and the provider's statements to them. This document has been checked and approved by the attending provider.  I have reviewed the above documentation for accuracy and completeness and I agree with the above.   Truitt Merle, MD 05/23/2016

## 2016-05-25 NOTE — Consult Note (Signed)
[facility] Consult: 4:01 PM 05/25/2016  LOS: 0 days    Referring Provider: Dr Fuller Plan Primary Care Physician:  Binnie Rail, MD Primary Gastroenterologist:  Dr. Fuller Plan  Oncologist: Truitt Merle   Reason for Consultation:  Abdominal pain following ERCP.   HPI: [name] is a 81 y.o. male.  PMH Prostate cancer.  DM 2 Metformin.  S/p left hip replacement.  Barrett's esophagus 2002.  History of perforated diverticulitis s/p sigmoid resection 2008.    05/2012 surveillance colonoscopy for history of adenomatous colon polyp in 2005.  Ascending, transverse and descending diverticulosis. Prior colorectal surgical anastomosis. Small internal hemorrhoids. Hx pancreatic cancer diagnosed in 2017.   09/2015 upper EUS with FNA. Dr. Ardis Hughs for evaluation of suspected pancreatic mass on CT.  Irregular, 2.5 centimeter mass in the pancreatic body, with upstream dilation of main pancreatic duct. Mass abutting SMA and celiac trunk.  No peripancreatic adenopathy. Cytology positive for adenocarcinoma.  Stage IIB (T2, N1, M0) in 09/2015.   Neoadjuvant chemo: gemcitabine and abraxane held after 11/30/2015 due to initiation of radiation.  S/p radiation therapy.  CT imaging 05/19/16 showing interval development of multiple soft tissue nodules in right and left upper abdomen compatible with peritoneal mets and new perihepatic free fluid. New onset intrahepatic ductal dilatation and dilation of gallbladder. Central bile duct tapering at the level of soft tissue heterogeneity near porta hepatis, possibly representing metastatic soft tissue mass or liver lesion. Pancreatic body mass slightly increased in size  CA 19-9 516 in mid 09/2015. It had dropped to 13 in early 01/2016. 314 on 04/25/16. 1456 on 05/23/16 On 05/23/2016 T bili 16.3. Alkaline phosphatase  491, AST/ALT 156/279.  K 3.4. Glucose 232.  Seen by Dr. Burr Medico 2/27 complaining of several weeks progressive, initially intermittent, 3-5/10,  abdominal dicomfort radiating to the back.  Discomfort, not pain, is somewhat relieved by PO intake but sometimes the pain is worsened by by mouth intake.   No nausea/vomiting.  Urine dark but not T collar.  10 # weight loss. Appetite depressed with early satiety for the past few weeks. Generalized fatigue for a couple of weeks compared with previously when he was able to ride a bike a few miles a day.    Today the patient underwent ERCP with finding of a severe, extrinsic, mal;ignant looking common hepatic duct stricture. Dr. Fuller Plan placed a 8.5 French 12 cm stent across the stricture. Patient received indomethacin suppository at the end of the case. While in recovery following ERCP, he had intense pain in the upper mid to left upper quadrant. He got up to 100 mg of fentanyl post ERCP and the pain improved a bit but persists. No nausea or vomiting. Pain feels a little bit like the discomfort he's been having but is much more intense. Single view abdomen shows plastic stent in right upper quadrant and unremarkable bowel gas pattern. No evidence for perforation/free air. Portable chest x-ray showing Port-A-Cath tip in superior vena cava, no edema, no consolidation. Aortic atherosclerosis present and postoperative changes in the right shoulder. Labs show a  potassium that has dropped further to 2.8.  Lipase normal at 18.  Total bilirubin is up to 20.4. Alkaline phosphatase, transaminases not much different than 2 days ago. Due to ongoing pain, have asked the hospitalist to admit the patient for management of pain and hypokalemia.   Past Medical History:  Diagnosis Date  . Arthritis    R hip and shoulder  . Complication of anesthesia    Local anesthetic used in office for Prostate biopsy" reaction Tachycardia,nausea, hallucinations, Blood pressure elevated"- No  problems once done at hospital with anesthesia" Thinks Ciprofloxacin was the injection"   . Diabetes mellitus   . Enlarged prostate   . Femoral bruit    R femoral  . GERD with stricture    PMH of  . History of kidney stones   . Hyperlipidemia   . Hypertension   . Macular degeneration    glaucoma suspect  . Pancreatic cancer ([place]) 09/2015   adenocarcinoma of pancreas  . PONV (postoperative nausea and vomiting)   . Prostate cancer [facility])    prostate ; Dr Wyvonnia Lora to be monitored x 5 yrs now.  . Squamous papilloma    of esophagus    Past Surgical History:  Procedure Laterality Date  . ANKLE FUSION  2004  . COLONOSCOPY  05/2012  . esophageal dilation  2002   Dr  Lucio Edward  . ESOPHAGOGASTRODUODENOSCOPY (EGD) WITH PROPOFOL N/A 09/30/2015   Procedure: ESOPHAGOGASTRODUODENOSCOPY (EGD) WITH PROPOFOL;  Surgeon: Milus Banister, MD;  Location: [facility];  Service: Endoscopy;  Laterality: N/A;  . EUS N/A 09/30/2015   Procedure: UPPER ENDOSCOPIC ULTRASOUND (EUS) RADIAL;  Surgeon: Milus Banister, MD;  Location: [facility];  Service: Endoscopy;  Laterality: N/A;  . FINE NEEDLE ASPIRATION N/A 09/30/2015   Procedure: FINE NEEDLE ASPIRATION (FNA) LINEAR;  Surgeon: Milus Banister, MD;  Location: [facility];  Service: Endoscopy;  Laterality: N/A;  . HERNIA REPAIR     bil inguinal hernias  . PARTIAL COLECTOMY     perforated diverticulitis  . PORTACATH PLACEMENT Left 10/13/2015   Procedure: INSERTION PORT-A-CATH;  Surgeon: [name], MD;  Location: [facility];  Service: General;  Laterality: Left;  . PROSTATE BIOPSY  03/29/2012   Procedure: BIOPSY TRANSRECTAL ULTRASONIC PROSTATE (TUBP);  Surgeon: Fredricka Bonine, MD;  Location: [facility];  Service: Urology;  Laterality: N/A;  . ROTATOR CUFF REPAIR  2006  . TOTAL HIP ARTHROPLASTY Left 2012    Prior to Admission medications   Medication Sig Start Date End Date Taking? Authorizing Provider  amLODipine  (NORVASC) 10 MG tablet Take 1 tablet (10 mg total) by mouth daily. 11/01/15  Yes Binnie Rail, MD  aspirin 81 MG tablet Take 1 tablet (81 mg total) by mouth daily. 03/31/12  Yes Festus Aloe, MD  doxazosin (CARDURA) 1 MG tablet TAKE 1 TABLET BY MOUTH EVERY DAY 09/30/15  Yes Binnie Rail, MD  hydrochlorothiazide (MICROZIDE) 12.5 MG capsule Take 1 capsule (12.5 mg total) by mouth daily. 10/11/15  Yes Binnie Rail, MD  latanoprost (XALATAN) 0.005 % ophthalmic solution Place 1 drop into both eyes at bedtime. 05/25/15  Yes Historical Provider, MD  metFORMIN (GLUCOPHAGE) 500 MG tablet Take 1 tablet (500 mg total) by mouth 2 (two) times daily with a meal. 10/07/15  Yes Binnie Rail, MD  multivitamin-lutein [facility]) CAPS Take 1 capsule by mouth daily.   Yes Historical Provider, MD  pravastatin (PRAVACHOL) 20 MG tablet Take 1 tablet (20 mg total) by  mouth at bedtime. 11/01/15  Yes Binnie Rail, MD  traMADol (ULTRAM) 50 MG tablet Take 0.5 tablets (25 mg total) by mouth every 6 (six) hours as needed. 05/23/16  Yes Truitt Merle, MD  lidocaine-prilocaine (EMLA) cream Apply 1 application topically as needed. Apply to pac site 1 hour prior to stick and cover with plastic wrap 10/12/15   Truitt Merle, MD  [facility] DELICA LANCETS [id number] MISC Use to check blood sugars once a day Dx E11.9 09/27/15   Binnie Rail, MD  [facility] VERIO test strip CHECK BLOOD SUGAR ONCE DAILY. DX250.00 11/23/14   Hendricks Limes, MD  [facility] An VERIO test strip CHECK BLOOD SUGAR ONCE DAILY 02/07/16   Binnie Rail, MD    Scheduled Meds: . indomethacin  100 mg Rectal Once   Infusions: . sodium chloride     PRN Meds:    Allergies as of 05/23/2016 - Review Complete 05/23/2016  Allergen Reaction Noted  . Ace inhibitors Swelling 08/02/2010  . Angiotensin receptor blockers  09/14/2011  . Ciprofloxacin Anaphylaxis 12/22/2009    Family History  Problem Relation Age of Onset  . Kidney disease Father     ? Rheumatic fever as child  .  Hypertension Father   . Heart attack Paternal Uncle     in 30s  . COPD Paternal Uncle     due to exposures in [facility] 2  . Kidney disease Paternal Aunt     ? Rheumatic fever as child  . Cancer Neg Hx   . Diabetes Neg Hx   . Stroke Neg Hx   . Colon cancer Neg Hx     Social History   Social History  . Marital status: Married    Spouse name: Deloris  . Number of children: 1  . Years of education: N/A   Occupational History  . Retired     Ecologist in [place]  . Smoking status: Former Smoker    Packs/day: 0.50    Years: 40.00    Types: Cigarettes    Quit date: 03/27/1989  . Smokeless tobacco: Never Used     Comment: smoked [place], up to 1 ppd  . Alcohol use Yes     Comment: social  . Drug use: No  . Sexual activity: Not on file   Other Topics Concern  . Not on file   Social History Narrative   Married to wife, Deloris for 54+ years   Retired here from [employer] in [employer]   Enjoys outdoor activities and bikes 2 miles/day   Has one grown son and teen granddaughter in area    [place]: Constitutional:  Per HPI ENT:  No nose bleeds Pulm:  No SOB or cough CV:  No palpitations, no LE edema.  GU:  No hematuria, no frequency GI:  Per HPI.  No dysphagia.  Loose BMs x 2 in last 24 hours.  No bloody stool Heme:  No unusual bleeding or bruising.   No previous issues with anemia  Transfusions:  None ever per his recall.   Neuro:  No headaches, no peripheral tingling or numbness Derm:  No itching, no rash or sores.  Endocrine:  No sweats or chills.  No polyuria or dysuria Immunization:  Current on flu shot. Travel:  None beyond local counties in last few months.    PHYSICAL EXAM: Vital signs in last 24 hours: Vitals:   05/25/16 1400 05/25/16 1450  BP: Marland Kitchen)  174/62 (!) 201/86  Pulse: 80   Resp: 13   Temp:     Wt Readings from Last 3 Encounters:  05/25/16 59 kg (130 lb)  05/23/16 57.9 kg (127 lb 11.2 oz)    04/26/16 62.1 kg (137 lb)   General: Jaundiced, cachectic, moderately uncomfortable appearing AAM. Head:  No facial asymmetry or swelling.  Eyes:  Icteric sclera, no conjunctival pallor. Ears:  Not hard of hearing.  Nose:  No congestion or discharge. Mouth:  Moist oropharynx. Dentition in good repair. Tongue midline. Neck:  No masses, no JVD. No thyromegaly. Lungs:  No dyspnea or cough. Breath sounds clear bilaterally. Heart: RRR. No MRG. S1, S2 present. Abdomen:  Soft, not tender or distended..   Rectal: Deferred   Musc/Skeltl: Thin limbs. No joint erythema or swelling. Extremities:  No CCE.  Neurologic:  Patient alert. Oriented times 3. No tremor. Moves all 4 limbs, limb strength not tested. Skin:  Jaundiced. Tattoos:  None.   Psych:  Calm, pleasant, cooperative.  Intake/Output from previous day: No intake/output data recorded. Intake/Output this shift: Total I/O In: 300 [I.V.:300] Out: 1 [Blood:1]  LAB RESULTS:  Recent Labs  05/23/16 0852 05/25/16 1429  WBC 7.0 10.4  HGB 12.6* 12.3*  HCT 36.2* 34.8*  PLT 247 266   BMET Lab Results  Component Value Date   NA 135 05/25/2016   NA 135 (L) 05/23/2016   NA 141 04/25/2016   K 2.8 (L) 05/25/2016   K 3.4 (L) 05/23/2016   K 3.6 04/25/2016   CL 99 (L) 05/25/2016   CL 103 09/07/2015   CL 100 06/17/2015   CO2 24 05/25/2016   CO2 25 05/23/2016   CO2 26 04/25/2016   GLUCOSE 159 (H) 05/25/2016   GLUCOSE 232 (H) 05/23/2016   GLUCOSE 147 (H) 04/25/2016   BUN 14 05/25/2016   BUN 18.6 05/23/2016   BUN 16.0 04/25/2016   CREATININE 0.92 05/25/2016   CREATININE 1.3 05/23/2016   CREATININE 0.9 04/25/2016   CALCIUM 9.0 05/25/2016   CALCIUM 9.4 05/23/2016   CALCIUM 9.7 04/25/2016   LFT  Recent Labs  05/23/16 0852 05/25/16 1429  PROT 7.0 6.8  ALBUMIN 3.2* 3.1*  AST 156* 158*  ALT 279* 230*  ALKPHOS 491* 460*  BILITOT 16.32* 20.4*   PT/INR Lab Results  Component Value Date   INR 0.96 03/29/2012   INR 2.00  (H) 09/15/2010   INR 1.95 (H) 09/14/2010   Hepatitis Panel No results for input(s): HEPBSAG, HCVAB, HEPAIGM, HEPBIGM in the last 72 hours. C-Diff No components found for: CDIFF Lipase     Component Value Date/Time   LIPASE 18 05/25/2016 1429    Drugs of Abuse  No results found for: LABOPIA, COCAINSCRNUR, LABBENZ, AMPHETMU, THCU, LABBARB   RADIOLOGY STUDIES: Dg Chest Port 1 View  Result Date: 05/25/2016 CLINICAL DATA:  Chest pain EXAM: PORTABLE CHEST 1 VIEW COMPARISON:  Chest radiograph October 13, 2015 and chest CT May 19, 2016 FINDINGS: Port-A-Cath tip is in the superior vena cava. No pneumothorax. There is no edema or consolidation. Heart size and pulmonary vascularity are normal. No adenopathy. There is atherosclerotic calcification in the aorta. There is postoperative change in the right shoulder. IMPRESSION: Port-A-Cath tip in superior vena cava. No edema or consolidation. There is aortic atherosclerosis. Electronically Signed   By: Lowella Grip III M.D.   On: 05/25/2016 15:12   Dg Abd Portable 1v  Result Date: 05/25/2016 CLINICAL DATA:  Abdominal pain EXAM: PORTABLE ABDOMEN - 1 VIEW  COMPARISON:  CT abdomen and pelvis May 19, 2016 FINDINGS: There is a biliary stent in the right upper quadrant with apparent contrast within the common bile duct. There is moderate stool throughout colon. There is no bowel dilatation or air-fluid levels suggesting bowel obstruction. No evident free air. There is a total hip prosthesis on the left. IMPRESSION: The pre stent in the right upper quadrant. Bowel gas pattern unremarkable. Electronically Signed   By: Lowella Grip III M.D.   On: 05/25/2016 15:14      IMPRESSION:   *  Acutely worsened abdominal pain following ERCP with stent placement.  Lipase currently normal but cannot rule out evolving post-ERCP pancreatitis. X-ray does not show evidence for perforation or obstruction.  *  Pancreatic cancer diagnosed 10/2015.  S/p  chemo-radiation , found to have progressive disease within the last couple of weeks corresponding with a general failure to thrive.  ERCP confirms malignant stricture  *  Hypokalemia.  Present 2 days ago at 3.4,  Now with potassium 2.8  *  Type 2 DM, on Metformin.     PLAN:     *  Observation Telemetry admission for pain management, potassium supplementation.  Dr Carolin Sicks of [facility] hospitalists seeing pt now and will admit.    *  Low threshold to perform CT scan without contrast as soon as tonight, to rule out evolving inflammation or small perforation not picked up on plain films.  *  Repeat lipase, chemistries in the morning.  Clear liquid diet.  Up the rate of the lactated Ringer's to 150 ML's per hour.   Azucena Freed  05/25/2016, 4:01 PM Pager: [phone]

## 2016-05-25 NOTE — Op Note (Signed)
[facility] Patient Name: [name] Procedure Date : 05/25/2016 MRN: [id number] Attending MD: Ladene Artist , MD Date of Birth: [birth year] CSN: [id number] Age: 81 Admit Type: Outpatient Procedure:                ERCP Indications:              Biliary dilation on Computed Tomogram Scan,                            Elevated liver enzymes Providers:                Pricilla Riffle. Fuller Plan, MD, Vista Lawman, RN, Elspeth Cho Tech., Technician, Cathe Mons, CRNA Referring MD:             Truitt Merle, MD Medicines:                General Anesthesia Complications:            No immediate complications. Estimated Blood Loss:     Estimated blood loss: none. Procedure:                Pre-Anesthesia Assessment:                           - Prior to the procedure, a History and Physical                            was performed, and patient medications and                            allergies were reviewed. The patient's tolerance of                            previous anesthesia was also reviewed. The risks                            and benefits of the procedure and the sedation                            options and risks were discussed with the patient.                            All questions were answered, and informed consent                            was obtained. Prior Anticoagulants: The patient has                            taken no previous anticoagulant or antiplatelet                            agents. ASA Grade Assessment: III - A patient with  severe systemic disease. After reviewing the risks                            and benefits, the patient was deemed in                            satisfactory condition to undergo the procedure.                           After obtaining informed consent, the scope was                            passed under direct vision. Throughout the                            procedure, the  patient's blood pressure, pulse, and                            oxygen saturations were monitored continuously. The                            [id number]) scope was introduced through                            the mouth, and used to inject contrast into and                            used to inject contrast into the bile duct. The                            ERCP was somewhat difficult due to challenging                            cannulation because of inadequate scope                            positioning. Successful completion of the procedure                            was aided by straightening and shortening the scope                            to obtain bowel loop reduction. The patient                            tolerated the procedure well. Scope In: Scope Out: Findings:      The scout film was normal. The esophagus was successfully intubated       under direct vision. The scope was advanced to a normal appearing major       papilla in the descending duodenum without detailed examination of the       pharynx, larynx and associated structures, and upper GI tract. There was       a mild benign appearing stricture at the EGJ, partially visualized. The  upper GI tract was otherwise grossly normal. Good positioning at the       ampulla was challenging to acheive and maintain. The bile duct was       deeply cannulated with a guidewiere and then with the short-nosed       traction sphincterotome. Contrast was injected. I personally interpreted       the bile duct images. There was appropriate flow of contrast through the       ducts. The common hepatic duct contained a single localized stenosis 15       mm in length. The left and right hepatic ducts and all intrahepatic       branches were diffusely dilated, due to an obstruction. The common bile       duct was normal. A straight Roadrunner wire was passed into the       intrahepatic biliary tree across the CHD stricture  One 8.5 Fr by 12 cm       transpapillary plastic stent with one external flap and one internal       flap was placed 10 cm into the common hepatic duct. Bile and clear fluid       flowed through the stent. The stent was in good position across the       stricture. The PD was cannulated twice with the guidewire prior to       acheiving biliary cannulation with the guidewire. The PD was not       injected with contrast by intention. Impression:               - A localized biliary stricture was found at the                            common hepatic duct. The stricture was severe,                            extrinsic and malignant appearing.                           - The left and right hepatic ducts and all                            intrahepatic branches were dilated, due to an                            obstruction.                           - One plastic stent was placed across the common                            hepatic duct stricture. Recommendation:           - Patient has a contact number available for                            emergencies. The signs and symptoms of potential                            delayed complications were discussed with the  patient. Return to normal activities tomorrow.                            Written discharge instructions were provided to the                            patient.                           - Clear liquid diet for 2 hours, then advance as                            tolerated to soft, low fat diet today. Resume prior                            diet tomorrow.                           - Return to GI office in 1 month.                           - Check liver enzymes (AST, ALT, alkaline                            phosphatase, bilirubin) in 2 weeks. Procedure Code(s):        --- Professional ---                           [phone], Endoscopic retrograde                            cholangiopancreatography (ERCP); with  placement of                            endoscopic stent into biliary or pancreatic duct,                            including pre- and post-dilation and guide wire                            passage, when performed, including sphincterotomy,                            when performed, each stent Diagnosis Code(s):        --- Professional ---                           K83.1, Obstruction of bile duct                           R74.8, Abnormal levels of other serum enzymes                           K83.8, Other specified diseases of biliary tract CPT copyright 2016 American Medical Association. All rights reserved. The codes documented in this report are preliminary and  upon coder review may  be revised to meet current compliance requirements. Ladene Artist, MD 05/25/2016 1:39:38 PM This report has been signed electronically. Number of Addenda: 0

## 2016-05-25 NOTE — Progress Notes (Signed)
Received PA response from [facility] [place]: [facility] - PA Case ID: [id number] - Rx #[id number] Need help? Call us at [phone]  Outcome  Approved today  Your request has been approved   Attempted to contact [facility] to advise and was on hold for 15 minutes, faxed to [facility]. Fax received ok per confirmation sheet.

## 2016-05-25 NOTE — Progress Notes (Signed)
Critical value for patients total bilirubin at 20.4. I notified MD and told the MD that was on the unit at the time of the call.

## 2016-05-25 NOTE — H&P (Signed)
History and Physical    [name] [id number] DOB: 1935-05-04 DOA: 05/25/2016  PCP: Binnie Rail, MD  Patient coming from: Admitted from Endoscopy  Chief Complaint: Admitted from endoscopy for hyperkalemia, abdominal pain.  HPI: [name] is a 81 y.o. male with medical history significant of prostate cancer undergoing chemotherapy and oncology follow-up, type 2 diabetes, came to endoscopy suite for outpatient ERCP. The patient underwent ERCP today with finding of severe extrinsic malignant-looking common bile duct stricture. Dr. Fuller Plan placed stent across the stricture. While in the recovery room, patient was complaining of severe abdominal pain which was not improved with pain medication in the recovery.patient was also found to have hyperkalemia, hypertension and total bilirubin of 20.4. GI consulted [place] for admission to observe overnight.  Patient reported that he has chronic abdominal pain which is mostly in the epigastric region, agonizing pain but today the pain worsen after ERCP. He has generally decreased oral intake and feels weak. Denied fever, chills, nausea, vomiting, diarrhea, constipation. No dysuria or urgency or frequency.  Patient underwent endoscopy and from there admitted directly as per GIs request  Review of Systems: As per HPI otherwise 10 point review of systems negative.    Past Medical History:  Diagnosis Date  . Arthritis    R hip and shoulder  . Complication of anesthesia    Local anesthetic used in office for Prostate biopsy" reaction Tachycardia,nausea, hallucinations, Blood pressure elevated"- No problems once done at hospital with anesthesia" Thinks Ciprofloxacin was the injection"   . Diabetes mellitus   . Enlarged prostate   . Femoral bruit    R femoral  . GERD with stricture    PMH of  . History of kidney stones   . Hyperlipidemia   . Hypertension   . Macular degeneration    glaucoma suspect  . Pancreatic cancer ([place]) 09/2015   adenocarcinoma of pancreas  . PONV (postoperative nausea and vomiting)   . Prostate cancer [facility])    prostate ; Dr Wyvonnia Lora to be monitored x 5 yrs now.  . Squamous papilloma    of esophagus    Past Surgical History:  Procedure Laterality Date  . ANKLE FUSION  2004  . COLONOSCOPY  05/2012  . esophageal dilation  2002   Dr  Lucio Edward  . ESOPHAGOGASTRODUODENOSCOPY (EGD) WITH PROPOFOL N/A 09/30/2015   Procedure: ESOPHAGOGASTRODUODENOSCOPY (EGD) WITH PROPOFOL;  Surgeon: Milus Banister, MD;  Location: [facility];  Service: Endoscopy;  Laterality: N/A;  . EUS N/A 09/30/2015   Procedure: UPPER ENDOSCOPIC ULTRASOUND (EUS) RADIAL;  Surgeon: Milus Banister, MD;  Location: [facility] ENDOS[facility];  Service: Endoscopy;  Laterality: N/A;  . FINE NEEDLE ASPIRATION N/A 09/30/2015   Procedure: FINE NEEDLE ASPIRATION (FNA) LINEAR;  Surgeon: Milus Banister, MD;  Location: [facility];  Service: Endoscopy;  Laterality: N/A;  . HERNIA REPAIR     bil inguinal hernias  . PARTIAL COLECTOMY     perforated diverticulitis  . PORTACATH PLACEMENT Left 10/13/2015   Procedure: INSERTION PORT-A-CATH;  Surgeon: [name], MD;  Location: [facility];  Service: General;  Laterality: Left;  . PROSTATE BIOPSY  03/29/2012   Procedure: BIOPSY TRANSRECTAL ULTRASONIC PROSTATE (TUBP);  Surgeon: Fredricka Bonine, MD;  Location: [facility];  Service: Urology;  Laterality: N/A;  . ROTATOR CUFF REPAIR  2006  . TOTAL HIP ARTHROPLASTY Left 2012    Social history: reports that he quit smoking about 27 years ago. His smoking use included Cigarettes. He has  a 20.00 pack-year smoking history. He has never used smokeless tobacco. He reports that he drinks alcohol. He reports that he does not use drugs.  Allergies  Allergen Reactions  . Ace Inhibitors Swelling    Swelling of lips  . Angiotensin Receptor Blockers     These are contraindicated because of angioedema with ACE inhibitors.  . Ciprofloxacin  Anaphylaxis    REACTION: anorexia , weakness, blurred vision    Family History  Problem Relation Age of Onset  . Kidney disease Father     ? Rheumatic fever as child  . Hypertension Father   . Heart attack Paternal Uncle     in 72s  . COPD Paternal Uncle     due to exposures in [facility]  . Kidney disease Paternal Aunt     ? Rheumatic fever as child  . Cancer Neg Hx   . Diabetes Neg Hx   . Stroke Neg Hx   . Colon cancer Neg Hx      Prior to Admission medications   Medication Sig Start Date End Date Taking? Authorizing Provider  amLODipine (NORVASC) 10 MG tablet Take 1 tablet (10 mg total) by mouth daily. 11/01/15  Yes Binnie Rail, MD  aspirin 81 MG tablet Take 1 tablet (81 mg total) by mouth daily. 03/31/12  Yes Festus Aloe, MD  doxazosin (CARDURA) 1 MG tablet TAKE 1 TABLET BY MOUTH EVERY DAY 09/30/15  Yes Binnie Rail, MD  hydrochlorothiazide (MICROZIDE) 12.5 MG capsule Take 1 capsule (12.5 mg total) by mouth daily. 10/11/15  Yes Binnie Rail, MD  latanoprost (XALATAN) 0.005 % ophthalmic solution Place 1 drop into both eyes at bedtime. 05/25/15  Yes Historical Provider, MD  metFORMIN (GLUCOPHAGE) 500 MG tablet Take 1 tablet (500 mg total) by mouth 2 (two) times daily with a meal. 10/07/15  Yes Binnie Rail, MD  multivitamin-lutein [facility]) CAPS Take 1 capsule by mouth daily.   Yes Historical Provider, MD  pravastatin (PRAVACHOL) 20 MG tablet Take 1 tablet (20 mg total) by mouth at bedtime. 11/01/15  Yes Binnie Rail, MD  traMADol (ULTRAM) 50 MG tablet Take 0.5 tablets (25 mg total) by mouth every 6 (six) hours as needed. 05/23/16  Yes Truitt Merle, MD  lidocaine-prilocaine (EMLA) cream Apply 1 application topically as needed. Apply to pac site 1 hour prior to stick and cover with plastic wrap 10/12/15   Truitt Merle, MD  [facility] DELICA LANCETS [id number] MISC Use to check blood sugars once a day Dx E11.9 09/27/15   Binnie Rail, MD  [facility] VERIO test strip CHECK BLOOD SUGAR ONCE DAILY.  DX250.00 11/23/14   Hendricks Limes, MD  [facility] VERIO test strip CHECK BLOOD SUGAR ONCE DAILY 02/07/16   Binnie Rail, MD    Physical Exam: Vitals:   05/25/16 1600 05/25/16 1610 05/25/16 1620 05/25/16 1630  BP: (!) 189/69 (!) 197/62 (!) 191/68 (!) 205/81  Pulse: 91 90 96 89  Resp: 15 (!) 23 20 14   Temp:      TempSrc:      SpO2: 97% 97% 99% 98%  Weight:      Height:          Constitutional: Pleasant elderly male lying on a stretcher comfortably, not in distress  Vitals:   05/25/16 1600 05/25/16 1610 05/25/16 1620 05/25/16 1630  BP: (!) 189/69 (!) 197/62 (!) 191/68 (!) 205/81  Pulse: 91 90 96 89  Resp: 15 (!) 23 20 14   Temp:  TempSrc:      SpO2: 97% 97% 99% 98%  Weight:      Height:       Eyes: PERRL, +++ icterus ENMT: Mucous membranes aredry.   Neck: normal Respiratory: clear to auscultation bilaterally, no wheezing, no crackles. Normal respiratory effort. No accessory muscle use.  Cardiovascular: Regular rate and rhythm,. No extremity edema. 2+ pedal pulses.  Abdomen:Soft, nontender, nondistended. Bowel sound positive.  Musculoskeletal: no clubbing / cyanosis.  Skin:Generalized body icterus  Neurologic:Alert, awake, oriented, following commands  Psychiatric: Normal judgment and insight. Alert and oriented x 3. Normal mood.    Labs on Admission: I have personally reviewed following labs and imaging studies  CBC:  Recent Labs Lab 05/23/16 0852 05/25/16 1429  WBC 7.0 10.4  NEUTROABS 5.5  --   HGB 12.6* 12.3*  HCT 36.2* 34.8*  MCV 78.0* 77.2*  PLT 247 [id number]   Basic Metabolic Panel:  Recent Labs Lab 05/23/16 0852 05/25/16 1429  NA 135* 135  K 3.4* 2.8*  CL  --  99*  CO2 25 24  GLUCOSE 232* 159*  BUN 18.6 14  CREATININE 1.3 0.92  CALCIUM 9.4 9.0   GFR: Estimated Creatinine Clearance: 55.2 mL/min (by C-G formula based on SCr of 0.92 mg/dL). Liver Function Tests:  Recent Labs Lab 05/23/16 0852 05/25/16 1429  AST 156* 158*  ALT 279* 230*    ALKPHOS 491* 460*  BILITOT 16.32* 20.4*  PROT 7.0 6.8  ALBUMIN 3.2* 3.1*    Recent Labs Lab 05/25/16 1429  LIPASE 18  AMYLASE 126*   No results for input(s): AMMONIA in the last 168 hours. Coagulation Profile: No results for input(s): INR, PROTIME in the last 168 hours. Cardiac Enzymes: No results for input(s): CKTOTAL, CKMB, CKMBINDEX, TROPONINI in the last 168 hours. BNP (last 3 results) No results for input(s): PROBNP in the last 8760 hours. HbA1C: No results for input(s): HGBA1C in the last 72 hours. CBG:  Recent Labs Lab 05/25/16 1129  GLUCAP 133*   Lipid Profile: No results for input(s): CHOL, HDL, LDLCALC, TRIG, CHOLHDL, LDLDIRECT in the last 72 hours. Thyroid Function Tests: No results for input(s): TSH, T4TOTAL, FREET4, T3FREE, THYROIDAB in the last 72 hours. Anemia Panel: No results for input(s): VITAMINB12, FOLATE, FERRITIN, TIBC, IRON, RETICCTPCT in the last 72 hours. Urine analysis:    Component Value Date/Time   COLORURINE YELLOW 09/07/2015 [place] 09/07/2015 1513   LABSPEC 1.020 09/07/2015 1513   PHURINE 6.0 09/07/2015 1513   GLUCOSEU NEGATIVE 09/07/2015 1513   HGBUR NEGATIVE 09/07/2015 1513   BILIRUBINUR NEGATIVE 09/07/2015 1513   KETONESUR TRACE (A) 09/07/2015 1513   PROTEINUR NEGATIVE 09/06/2010 1100   UROBILINOGEN 0.2 09/07/2015 1513   NITRITE NEGATIVE 09/07/2015 1513   LEUKOCYTESUR NEGATIVE 09/07/2015 1513   Sepsis Labs: !!!!!!!!!!!!!!!!!!!!!!!!!!!!!!!!!!!!!!!!!!!! @LABRCNTIP (procalcitonin:4,lacticidven:4) )No results found for this or any previous visit (from the past 240 hour(s)).   Radiological Exams on Admission: Dg Chest Port 1 View  Result Date: 05/25/2016 CLINICAL DATA:  Chest pain EXAM: PORTABLE CHEST 1 VIEW COMPARISON:  Chest radiograph October 13, 2015 and chest CT May 19, 2016 FINDINGS: Port-A-Cath tip is in the superior vena cava. No pneumothorax. There is no edema or consolidation. Heart size and pulmonary  vascularity are normal. No adenopathy. There is atherosclerotic calcification in the aorta. There is postoperative change in the right shoulder. IMPRESSION: Port-A-Cath tip in superior vena cava. No edema or consolidation. There is aortic atherosclerosis. Electronically Signed   By: Lowella Grip III  M.D.   On: 05/25/2016 15:12   Dg Abd Portable 1v  Result Date: 05/25/2016 CLINICAL DATA:  Abdominal pain EXAM: PORTABLE ABDOMEN - 1 VIEW COMPARISON:  CT abdomen and pelvis May 19, 2016 FINDINGS: There is a biliary stent in the right upper quadrant with apparent contrast within the common bile duct. There is moderate stool throughout colon. There is no bowel dilatation or air-fluid levels suggesting bowel obstruction. No evident free air. There is a total hip prosthesis on the left. IMPRESSION: The pre stent in the right upper quadrant. Bowel gas pattern unremarkable. Electronically Signed   By: Lowella Grip III M.D.   On: 05/25/2016 15:14     Assessment/Plan Active Problems:   Hypokalemia   Biliary obstruction   Elevated LFTs   Abnormal CT scan, gastrointestinal tract  #History of pancreatic cancer with biliary stricture status post ERCP by Gi: Patient with elevated bilirubin and transaminitis -Patient reported increased abdominal pain after ERCP. The lipase level not elevated. Abdomen x-ray with no free air. Observe overnight for pain management. -I will continue home dose of Toradol and add IV Dilaudid for breakthrough pain management. Clear liquid diet as per GI. -Added Colace as a bowel regimen -Repeat CBC, CMP, lipase level in the morning -Further evaluation and management deferred to GI and oncologist outpatient. Patient may benefit from outpatient palliative care evaluation.  #Hypokalemia: Likely in the setting of decreased oral intake. The bleed potassium chloride IV. Patient is getting LR as per GI. I would repeat magnesium level in the morning. Continue to monitor BMP. Will  start oral potassium. -Observe in telemetry  #Diabetes: Hold metformin. Cover hyperglycemia with insulin sliding scale. Patient will be on clear liquid diet as per GI.  #Hypertensive urgency likely contributed by abdominal pain: Resume home medication. I will add hydralazine IV as needed. Avoid sudden drop in blood pressure. Manage pain.  DVT prophylaxis: SCD. Avoid anticoagulation because patient has coagulopathy and had a procedure today.  Code Status: Full code  Family Communication:No family present at bedside  Disposition Plan:Observe in telemetry floor tonight  Consults called:GIs following  Admission status:Observation.   Travonna Swindle Tanna Furry MD [facility] Pager [phone]  If 7PM-7AM, please contact night-coverage [email or web address] Password [email or web address]  05/25/2016, 4:45 PM

## 2016-05-25 NOTE — Interval H&P Note (Signed)
History and Physical Interval Note:  05/25/2016 12:29 PM  [name]  has presented today for surgery, with the diagnosis of biliary obstruction  The various methods of treatment have been discussed with the patient and family. After consideration of risks, benefits and other options for treatment, the patient has consented to  Procedure(s): ENDOSCOPIC RETROGRADE CHOLANGIOPANCREATOGRAPHY (ERCP) (N/A) as a surgical intervention .  The patient's history has been reviewed, patient examined, no change in status, stable for surgery.  I have reviewed the patient's chart and labs.  Questions were answered to the patient's satisfaction.     Pricilla Riffle. Fuller Plan

## 2016-05-26 ENCOUNTER — Encounter: Payer: Self-pay | Admitting: Hematology

## 2016-05-26 ENCOUNTER — Encounter (HOSPITAL_COMMUNITY): Payer: Self-pay | Admitting: Gastroenterology

## 2016-05-26 ENCOUNTER — Telehealth: Payer: Self-pay | Admitting: Hematology

## 2016-05-26 DIAGNOSIS — I160 Hypertensive urgency: Secondary | ICD-10-CM | POA: Diagnosis not present

## 2016-05-26 DIAGNOSIS — K831 Obstruction of bile duct: Secondary | ICD-10-CM | POA: Diagnosis not present

## 2016-05-26 DIAGNOSIS — K859 Acute pancreatitis without necrosis or infection, unspecified: Secondary | ICD-10-CM | POA: Diagnosis not present

## 2016-05-26 DIAGNOSIS — G8918 Other acute postprocedural pain: Secondary | ICD-10-CM | POA: Diagnosis not present

## 2016-05-26 DIAGNOSIS — C250 Malignant neoplasm of head of pancreas: Secondary | ICD-10-CM | POA: Diagnosis not present

## 2016-05-26 DIAGNOSIS — Z9889 Other specified postprocedural states: Secondary | ICD-10-CM

## 2016-05-26 DIAGNOSIS — I10 Essential (primary) hypertension: Secondary | ICD-10-CM | POA: Diagnosis not present

## 2016-05-26 DIAGNOSIS — E876 Hypokalemia: Secondary | ICD-10-CM | POA: Diagnosis not present

## 2016-05-26 DIAGNOSIS — C61 Malignant neoplasm of prostate: Secondary | ICD-10-CM | POA: Diagnosis not present

## 2016-05-26 LAB — COMPREHENSIVE METABOLIC PANEL
ALT: 195 U/L — AB (ref 17–63)
AST: 104 U/L — AB (ref 15–41)
Albumin: 3 g/dL — ABNORMAL LOW (ref 3.5–5.0)
Alkaline Phosphatase: 402 U/L — ABNORMAL HIGH (ref 38–126)
Anion gap: 10 (ref 5–15)
BILIRUBIN TOTAL: 10.4 mg/dL — AB (ref 0.3–1.2)
BUN: 9 mg/dL (ref 6–20)
CALCIUM: 8.7 mg/dL — AB (ref 8.9–10.3)
CO2: 26 mmol/L (ref 22–32)
CREATININE: 0.91 mg/dL (ref 0.61–1.24)
Chloride: 96 mmol/L — ABNORMAL LOW (ref 101–111)
GFR calc Af Amer: >60 mL/min (ref >60)
Glucose, Bld: 162 mg/dL — ABNORMAL HIGH (ref 65–99)
Potassium: 3.2 mmol/L — ABNORMAL LOW (ref 3.5–5.1)
Sodium: 132 mmol/L — ABNORMAL LOW (ref 135–145)
TOTAL PROTEIN: 6.3 g/dL — AB (ref 6.5–8.1)

## 2016-05-26 LAB — MAGNESIUM: MAGNESIUM: 1.7 mg/dL (ref 1.7–2.4)

## 2016-05-26 LAB — CBC
HCT: 33.4 % — ABNORMAL LOW (ref 39.0–52.0)
Hemoglobin: 11.5 g/dL — ABNORMAL LOW (ref 13.0–17.0)
MCH: 26.7 pg (ref 26.0–34.0)
MCHC: 34.4 g/dL (ref 30.0–36.0)
MCV: 77.5 fL — ABNORMAL LOW (ref 78.0–100.0)
PLATELETS: 260 10*3/uL (ref 150–400)
RBC: 4.31 MIL/uL (ref 4.22–5.81)
RDW: 17.4 % — AB (ref 11.5–15.5)
WBC: 10 10*3/uL (ref 4.0–10.5)

## 2016-05-26 LAB — GLUCOSE, CAPILLARY
GLUCOSE-CAPILLARY: 129 mg/dL — AB (ref 65–99)
Glucose-Capillary: 125 mg/dL — ABNORMAL HIGH (ref 65–99)

## 2016-05-26 LAB — LIPASE, BLOOD: LIPASE: 78 U/L — AB (ref 11–51)

## 2016-05-26 MED ORDER — PROSIGHT PO TABS
1.0000 | ORAL_TABLET | Freq: Every day | ORAL | Status: DC
  Administered 2016-05-26: 1 via ORAL
  Filled 2016-05-26: qty 1

## 2016-05-26 MED ORDER — POTASSIUM CHLORIDE ER 10 MEQ PO TBCR: 20.0000 meq | EXTENDED_RELEASE_TABLET | Freq: Every day | ORAL | 0 refills | Status: DC

## 2016-05-26 MED ORDER — POTASSIUM CHLORIDE 20 MEQ/15ML (10%) PO SOLN: 20.0000 meq | Freq: Every day | ORAL | 0 refills | Status: AC

## 2016-05-26 MED ORDER — HYDROCODONE-ACETAMINOPHEN 5-325 MG PO TABS: 1.0000 | ORAL_TABLET | Freq: Four times a day (QID) | ORAL | 0 refills | Status: AC | PRN

## 2016-05-26 MED ORDER — HYDROMORPHONE HCL 2 MG/ML IJ SOLN: 0.5000 mg | Freq: Once | INTRAMUSCULAR | Status: DC

## 2016-05-26 NOTE — Progress Notes (Signed)
Pt tolerated regular diet. Denies abd pain. No nausea, no vomiting.  Discharge instructions given to pt, verbalized understanding. Discharged to home accompanied by spouse.

## 2016-05-26 NOTE — Discharge Summary (Signed)
Physician Discharge Summary  [name] [id number] DOB: October 15, 1935 DOA: 05/25/2016  PCP: Binnie Rail, MD  Admit date: 05/25/2016 Discharge date: 05/26/2016  Admitted From:From endoscopy after ERCP, admission requested by GI. Disposition:  Home  Recommendations for Outpatient Follow-up:  1. Follow up with PCP, GI and oncologist in 1-2 weeks 2. Please obtain BMP/CBC in one week 3. Please do not drive while on pain medications.  Home Health: No Equipment/Devices: No Discharge Condition: Stable CODE STATUS: Full code Diet recommendation: Heart healthy  Brief/Interim Summary:80 y.o. male with medical history significant of prostate cancer undergoing chemotherapy and oncology follow-up, type 2 diabetes, came to endoscopy suite for outpatient ERCP. The patient underwent ERCP on 05/25/16 with finding of severe extrinsic malignant-looking common bile duct stricture. Dr. Fuller Plan placed stent across the stricture. While in the recovery room, patient was complaining of severe abdominal pain which was not improving with pain medication.patient was also found to have hypokalemia, hypertension and total bilirubin of 20.4. GI consulted [place] for admission to observe overnight.  The abdominal pain has significantly improved. Patient likely has acute mild post-ERCP pancreatitis. He is feeling already today. The diet was advanced by GI today. Patient has significantly dropped to 10 from 20. LFTs are improving. Okay to discharge with pain medications with outpatient follow-ups as per GI. I discussed with the GI today. I ordered Norco for the pain management.   Patient was also treated for hyperkalemia with potassium chloride IV. Serum potassium level of 3 today. He is starting to eat. Discharge home with oral potassium chloride every day with outpatient lab monitoring. I discussed with the patient and his wife at bedside. They verbalized understanding.  Patient with elevated blood pressure, hypertensive urgency  on admission likely contributed by the pain. Continue home medication. Recommended to monitor blood pressures at home and follow up with PCP.  I discussed with the patient, his wife in detail regarding discharge plan. I also discussed with the patient's nurse to monitor his oral intake today and can discharge home if he tolerates diet.  Continue home medication including metformin for diabetes.  Discharge Diagnoses:  Active Problems:   Hypokalemia   Biliary obstruction   Elevated LFTs   Abnormal CT scan, gastrointestinal tract    Discharge Instructions  Discharge Instructions    Call MD for:  difficulty breathing, headache or visual disturbances    Complete by:  As directed    Call MD for:  extreme fatigue    Complete by:  As directed    Call MD for:  hives    Complete by:  As directed    Call MD for:  persistant dizziness or light-headedness    Complete by:  As directed    Call MD for:  persistant nausea and vomiting    Complete by:  As directed    Call MD for:  severe uncontrolled pain    Complete by:  As directed    Call MD for:  temperature >100.4    Complete by:  As directed    Diet - low sodium heart healthy    Complete by:  As directed    Discharge instructions    Complete by:  As directed    Please check lab (potassium) with your PCP in one week Please follow up with your oncologist and GI in 1-2 weeks.   Increase activity slowly    Complete by:  As directed      Allergies as of 05/26/2016  Reactions   Ace Inhibitors Swelling   Swelling of lips - reaction to Benazepril   Angiotensin Receptor Blockers    These are contraindicated because of angioedema with ACE inhibitors.   Ciprofloxacin Anaphylaxis   REACTION: anorexia , weakness, blurred vision      Medication List    STOP taking these medications   multivitamin with minerals Tabs tablet     TAKE these medications   ALEVE 220 MG tablet Generic drug:  naproxen sodium Take 220 mg by mouth daily as  needed (pain/headache).   amLODipine 10 MG tablet Commonly known as:  NORVASC Take 1 tablet (10 mg total) by mouth daily.   aspirin 81 MG tablet Take 1 tablet (81 mg total) by mouth daily.   CALCIUM-D PO Take 1 tablet by mouth daily.   doxazosin 1 MG tablet Commonly known as:  CARDURA TAKE 1 TABLET BY MOUTH EVERY DAY   hydrochlorothiazide 12.5 MG capsule Commonly known as:  MICROZIDE Take 1 capsule (12.5 mg total) by mouth daily.   HYDROcodone-acetaminophen 5-325 MG tablet Commonly known as:  NORCO Take 1 tablet by mouth every 6 (six) hours as needed for moderate pain.   latanoprost 0.005 % ophthalmic solution Commonly known as:  XALATAN Place 1 drop into both eyes at bedtime.   lidocaine-prilocaine cream Commonly known as:  EMLA Apply 1 application topically as needed. Apply to pac site 1 hour prior to stick and cover with plastic wrap What changed:  when to take this  additional instructions   metFORMIN 500 MG tablet Commonly known as:  GLUCOPHAGE Take 1 tablet (500 mg total) by mouth 2 (two) times daily with a meal.   multivitamin-lutein Caps capsule Take 1 capsule by mouth 2 (two) times daily.   ONETOUCH DELICA LANCETS 99991111 Misc Use to check blood sugars once a day Dx E11.9   ONETOUCH VERIO test strip Generic drug:  glucose blood CHECK BLOOD SUGAR ONCE DAILY. DX250.00   ONETOUCH VERIO test strip Generic drug:  glucose blood CHECK BLOOD SUGAR ONCE DAILY   OVER THE COUNTER MEDICATION Place 1 drop into both eyes daily. Over the counter lubricating eye drops   potassium chloride 10 MEQ tablet Commonly known as:  K-DUR Take 2 tablets (20 mEq total) by mouth daily.   pravastatin 20 MG tablet Commonly known as:  PRAVACHOL Take 1 tablet (20 mg total) by mouth at bedtime.   traMADol 50 MG tablet Commonly known as:  ULTRAM Take 0.5 tablets (25 mg total) by mouth every 6 (six) hours as needed. What changed:  reasons to take this      Follow-up  Information    Binnie Rail, MD. Schedule an appointment as soon as possible for a visit in 1 week(s).   Specialty:  Internal Medicine Contact information: [place] [address] [phone]        Jerene Bears, MD. Schedule an appointment as soon as possible for a visit in 2 week(s).   Specialty:  Gastroenterology Contact information: [address] [address] [phone]          Allergies  Allergen Reactions  . Ace Inhibitors Swelling    Swelling of lips - reaction to Benazepril  . Angiotensin Receptor Blockers     These are contraindicated because of angioedema with ACE inhibitors.  . Ciprofloxacin Anaphylaxis    REACTION: anorexia , weakness, blurred vision    Consultations: GI  Procedures/Studies: Admitted after ERCP  Subjective: Patient was seen  and examined at bedside. He reported feeling much better today. Denied nausea vomiting chest pain or shortness of breath. Patient's wife at bedside.  Discharge Exam: Vitals:   05/26/16 0600 05/26/16 0959  BP: (!) 178/82 (!) 167/76  Pulse: 80 87  Resp: 18   Temp: 98.6 F (37 C) 98.6 F (37 C)   Vitals:   05/25/16 2119 05/26/16 0146 05/26/16 0600 05/26/16 0959  BP: (!) 174/78 (!) 164/80 (!) 178/82 (!) 167/76  Pulse: 90 84 80 87  Resp: 18 18 18    Temp: 98.7 F (37.1 C) 98 F (36.7 C) 98.6 F (37 C) 98.6 F (37 C)  TempSrc: Oral Oral Oral Oral  SpO2: 97% 98% 100% 98%  Weight:      Height:        General: Pt is alert, awake, not in acute distress Eye: Icterus improving. Cardiovascular: RRR, S1/S2 +, no rubs, no gallops Respiratory: CTA bilaterally, no wheezing, no rhonchi Abdominal: Soft, NT, ND, bowel sounds + Extremities: no edema, no cyanosis    The results of significant diagnostics from this hospitalization (including imaging, microbiology, ancillary and laboratory) are listed below for reference.     Microbiology: No results found for this or any previous  visit (from the past 240 hour(s)).   Labs: BNP (last 3 results) No results for input(s): BNP in the last 8760 hours. Basic Metabolic Panel:  Recent Labs Lab 05/23/16 0852 05/25/16 1429 05/26/16 0522  NA 135* 135 132*  K 3.4* 2.8* 3.2*  CL  --  99* 96*  CO2 25 24 26   GLUCOSE 232* 159* 162*  BUN 18.6 14 9   CREATININE 1.3 0.92 0.91  CALCIUM 9.4 9.0 8.7*  MG  --   --  1.7   Liver Function Tests:  Recent Labs Lab 05/23/16 0852 05/25/16 1429 05/26/16 0522  AST 156* 158* 104*  ALT 279* 230* 195*  ALKPHOS 491* 460* 402*  BILITOT 16.32* 20.4* 10.4*  PROT 7.0 6.8 6.3*  ALBUMIN 3.2* 3.1* 3.0*    Recent Labs Lab 05/25/16 1429 05/26/16 0522  LIPASE 18 78*  AMYLASE 126*  --    No results for input(s): AMMONIA in the last 168 hours. CBC:  Recent Labs Lab 05/23/16 0852 05/25/16 1429 05/26/16 0522  WBC 7.0 10.4 10.0  NEUTROABS 5.5  --   --   HGB 12.6* 12.3* 11.5*  HCT 36.2* 34.8* 33.4*  MCV 78.0* 77.2* 77.5*  PLT 247 266 260   Cardiac Enzymes: No results for input(s): CKTOTAL, CKMB, CKMBINDEX, TROPONINI in the last 168 hours. BNP: Invalid input(s): POCBNP CBG:  Recent Labs Lab 05/25/16 1129 05/25/16 2125 05/26/16 0746  GLUCAP 133* 244* 129*   D-Dimer No results for input(s): DDIMER in the last 72 hours. Hgb A1c No results for input(s): HGBA1C in the last 72 hours. Lipid Profile No results for input(s): CHOL, HDL, LDLCALC, TRIG, CHOLHDL, LDLDIRECT in the last 72 hours. Thyroid function studies No results for input(s): TSH, T4TOTAL, T3FREE, THYROIDAB in the last 72 hours.  Invalid input(s): FREET3 Anemia work up No results for input(s): VITAMINB12, FOLATE, FERRITIN, TIBC, IRON, RETICCTPCT in the last 72 hours. Urinalysis    Component Value Date/Time   COLORURINE YELLOW 09/07/2015 1513   APPEARANCEUR CLEAR 09/07/2015 1513   LABSPEC 1.020 09/07/2015 1513   PHURINE 6.0 09/07/2015 1513   GLUCOSEU NEGATIVE 09/07/2015 1513   HGBUR NEGATIVE 09/07/2015  1513   BILIRUBINUR NEGATIVE 09/07/2015 1513   KETONESUR TRACE (A) 09/07/2015 1513   PROTEINUR NEGATIVE 09/06/2010  1100   UROBILINOGEN 0.2 09/07/2015 1513   NITRITE NEGATIVE 09/07/2015 1513   LEUKOCYTESUR NEGATIVE 09/07/2015 1513   Sepsis Labs Invalid input(s): PROCALCITONIN,  WBC,  LACTICIDVEN Microbiology No results found for this or any previous visit (from the past 240 hour(s)).   Time coordinating discharge: 32 minutes  SIGNED:   Rosita Fire, MD  [facility] 05/26/2016, 11:40 AM  If 7PM-7AM, please contact night-coverage [email or web address] Password [email or web address]

## 2016-05-26 NOTE — Progress Notes (Signed)
Daily Rounding Note  05/26/2016, 10:18 AM  LOS: 0 days   SUBJECTIVE:   Chief complaint: upper/LUQ abdominal pain.  This is better today.  Appetite better, he is hungry.  Tolerating clears.  Urine is less dark and wife noticing pt less jaundiced.    Used 25 mg Tramadol and 1 mg Dilaudid once he transferred to floor from endo unit.  1.5 mg Dilaudid so far today.   Lipase bumped ou this AM.  LFTs improved.   OBJECTIVE:         Vital signs in last 24 hours:    Temp:  [98 F (36.7 C)-98.7 F (37.1 C)] 98.6 F (37 C) (03/02 0959) Pulse Rate:  [78-96] 87 (03/02 0959) Resp:  [13-23] 18 (03/02 0600) BP: (155-217)/(62-89) 167/76 (03/02 0959) SpO2:  [97 %-100 %] 98 % (03/02 0959) Weight:  [59 kg (130 lb)] 59 kg (130 lb) (03/01 1107) Last BM Date: 05/25/16 Filed Weights   05/25/16 1107  Weight: 59 kg (130 lb)   General: noticeably less jaundiced  Heart: RRR.   Chest: clear bil.  No dyspnea or cough Abdomen: soft, mild if any tenderness in LUQ.  Active BS.  ND  Extremities: no CCE Neuro/Psych:  Oriented x 3.  Fully alert. No gross deficits.   Intake/Output from previous day: 03/01 0701 - 03/02 0700 In: 400 [P.O.:100; I.V.:300] Out: 1051 [Urine:1050; Blood:1]  Intake/Output this shift: Total I/O In: 635 [P.O.:635] Out: 200 [Urine:200]  Lab Results:  Recent Labs  05/25/16 1429 05/26/16 0522  WBC 10.4 10.0  HGB 12.3* 11.5*  HCT 34.8* 33.4*  PLT 266 260   BMET  Recent Labs  05/25/16 1429 05/26/16 0522  NA 135 132*  K 2.8* 3.2*  CL 99* 96*  CO2 24 26  GLUCOSE 159* 162*  BUN 14 9  CREATININE 0.92 0.91  CALCIUM 9.0 8.7*   LFT  Recent Labs  05/25/16 1429 05/26/16 0522  PROT 6.8 6.3*  ALBUMIN 3.1* 3.0*  AST 158* 104*  ALT 230* 195*  ALKPHOS 460* 402*  BILITOT 20.4* 10.4*   PT/INR No results for input(s): LABPROT, INR in the last 72 hours. Hepatitis Panel No results for input(s): HEPBSAG, HCVAB,  HEPAIGM, HEPBIGM in the last 72 hours.  Studies/Results: Dg Chest Port 1 View  Result Date: 05/25/2016 CLINICAL DATA:  Chest pain EXAM: PORTABLE CHEST 1 VIEW COMPARISON:  Chest radiograph October 13, 2015 and chest CT May 19, 2016 FINDINGS: Port-A-Cath tip is in the superior vena cava. No pneumothorax. There is no edema or consolidation. Heart size and pulmonary vascularity are normal. No adenopathy. There is atherosclerotic calcification in the aorta. There is postoperative change in the right shoulder. IMPRESSION: Port-A-Cath tip in superior vena cava. No edema or consolidation. There is aortic atherosclerosis. Electronically Signed   By: Lowella Grip III M.D.   On: 05/25/2016 15:12   Dg Ercp  Result Date: 05/26/2016 CLINICAL DATA:  Biliary obstruction and history of pancreatic carcinoma. EXAM: ERCP TECHNIQUE: Multiple spot images obtained with the fluoroscopic device and submitted for interpretation post-procedure. COMPARISON:  CT of the abdomen on 05/19/2016 FINDINGS: Imaging was obtained with a C-arm during the endoscopic procedure. This demonstrates cannulation of the common bile duct with contrast cholangiogram demonstrating a high-grade stricture of the common hepatic duct just below the confluence of right and left-sided intrahepatic bile ducts. After guidewire passage, an endoscopic biliary stent was placed extending across the stricture and into the duodenum. IMPRESSION:  High-grade stricture of the common hepatic duct just below the confluence of right and left intrahepatic bile ducts. An endoscopic biliary stent was placed extending across the stricture. These images were submitted for radiologic interpretation only. Please see the procedural report for the amount of contrast and the fluoroscopy time utilized. Electronically Signed   By: Aletta Edouard M.D.   On: 05/26/2016 08:29   Dg Abd Portable 1v  Result Date: 05/25/2016 CLINICAL DATA:  Abdominal pain EXAM: PORTABLE ABDOMEN - 1 VIEW  COMPARISON:  CT abdomen and pelvis May 19, 2016 FINDINGS: There is a biliary stent in the right upper quadrant with apparent contrast within the common bile duct. There is moderate stool throughout colon. There is no bowel dilatation or air-fluid levels suggesting bowel obstruction. No evident free air. There is a total hip prosthesis on the left. IMPRESSION: The pre stent in the right upper quadrant. Bowel gas pattern unremarkable. Electronically Signed   By: Lowella Grip III M.D.   On: 05/25/2016 15:14    ASSESMENT:   *  Mild post ERCP pancreatitis.  Pain improved  *  Pancreatic cancer diagnosed 10/2015.  S/p chemo-radiation , found to have progressive disease within the last couple of weeks corresponding with a general failure to thrive.  ERCP confirms malignant stricture.  LFTs, especially T bili much improved post placement of plastic stent to CHD. Pt felling better with decreased malaise, return of appetite.    *  Hypokalemia, persists though improved.  Hyponatremia is new.   *  Type 2 DM, on Metformin  *  Microcytosis with minor anemia.     PLAN   *  Advance to low fat (ie HH) diet.  If tolerates this, could d/c home today.  Would provide limited Rx for oxycodone or other oral opiate for pain control at discharge.  He has Tramadol at home but given circumstances opiates may be more effective.     Azucena Freed  05/26/2016, 10:18 AM Pager: [phone]

## 2016-05-26 NOTE — Telephone Encounter (Signed)
Received FMLA paperwork to be completed for [name]

## 2016-05-26 NOTE — Progress Notes (Signed)
Received PA approval for Lidocaine/Prilocaine cream from [place].  Approval 02/25/16-08/23/16 unless notified otherwise under certain conditions.

## 2016-05-29 ENCOUNTER — Other Ambulatory Visit: Payer: Self-pay

## 2016-05-29 DIAGNOSIS — R7989 Other specified abnormal findings of blood chemistry: Secondary | ICD-10-CM

## 2016-05-29 DIAGNOSIS — R945 Abnormal results of liver function studies: Principal | ICD-10-CM

## 2016-05-30 ENCOUNTER — Other Ambulatory Visit: Payer: Self-pay | Admitting: Radiology

## 2016-05-30 ENCOUNTER — Telehealth: Payer: Self-pay | Admitting: Hematology

## 2016-05-30 NOTE — Telephone Encounter (Signed)
Faxed FMLA paperwork for [name] to [employer] fax [phone]

## 2016-06-01 ENCOUNTER — Ambulatory Visit (HOSPITAL_COMMUNITY)
Admission: RE | Admit: 2016-06-01 | Discharge: 2016-06-01 | Disposition: A | Discharge status: Home / Self Care | Payer: Medicare Other | Source: Ambulatory Visit | Attending: Diagnostic Radiology | Admitting: Diagnostic Radiology

## 2016-06-01 ENCOUNTER — Ambulatory Visit (HOSPITAL_COMMUNITY)
Admission: RE | Admit: 2016-06-01 | Discharge: 2016-06-01 | Disposition: A | Discharge status: Home / Self Care | Payer: Medicare Other | Source: Ambulatory Visit | Attending: Hematology | Admitting: Hematology

## 2016-06-01 ENCOUNTER — Encounter (HOSPITAL_COMMUNITY): Payer: Self-pay

## 2016-06-01 DIAGNOSIS — N400 Enlarged prostate without lower urinary tract symptoms: Secondary | ICD-10-CM | POA: Diagnosis not present

## 2016-06-01 DIAGNOSIS — K5730 Diverticulosis of large intestine without perforation or abscess without bleeding: Secondary | ICD-10-CM | POA: Insufficient documentation

## 2016-06-01 DIAGNOSIS — M47816 Spondylosis without myelopathy or radiculopathy, lumbar region: Secondary | ICD-10-CM | POA: Diagnosis not present

## 2016-06-01 DIAGNOSIS — C251 Malignant neoplasm of body of pancreas: Secondary | ICD-10-CM

## 2016-06-01 DIAGNOSIS — Z87442 Personal history of urinary calculi: Secondary | ICD-10-CM | POA: Insufficient documentation

## 2016-06-01 DIAGNOSIS — N281 Cyst of kidney, acquired: Secondary | ICD-10-CM | POA: Insufficient documentation

## 2016-06-01 DIAGNOSIS — Z9221 Personal history of antineoplastic chemotherapy: Secondary | ICD-10-CM | POA: Insufficient documentation

## 2016-06-01 DIAGNOSIS — K219 Gastro-esophageal reflux disease without esophagitis: Secondary | ICD-10-CM | POA: Diagnosis not present

## 2016-06-01 DIAGNOSIS — C259 Malignant neoplasm of pancreas, unspecified: Secondary | ICD-10-CM | POA: Diagnosis not present

## 2016-06-01 DIAGNOSIS — Z7984 Long term (current) use of oral hypoglycemic drugs: Secondary | ICD-10-CM | POA: Diagnosis not present

## 2016-06-01 DIAGNOSIS — E785 Hyperlipidemia, unspecified: Secondary | ICD-10-CM | POA: Insufficient documentation

## 2016-06-01 DIAGNOSIS — Z8546 Personal history of malignant neoplasm of prostate: Secondary | ICD-10-CM | POA: Diagnosis not present

## 2016-06-01 DIAGNOSIS — K669 Disorder of peritoneum, unspecified: Secondary | ICD-10-CM | POA: Diagnosis not present

## 2016-06-01 DIAGNOSIS — Z87891 Personal history of nicotine dependence: Secondary | ICD-10-CM | POA: Insufficient documentation

## 2016-06-01 DIAGNOSIS — E119 Type 2 diabetes mellitus without complications: Secondary | ICD-10-CM | POA: Diagnosis not present

## 2016-06-01 DIAGNOSIS — Z923 Personal history of irradiation: Secondary | ICD-10-CM | POA: Diagnosis not present

## 2016-06-01 DIAGNOSIS — Z9889 Other specified postprocedural states: Secondary | ICD-10-CM | POA: Diagnosis not present

## 2016-06-01 DIAGNOSIS — R1909 Other intra-abdominal and pelvic swelling, mass and lump: Secondary | ICD-10-CM | POA: Diagnosis not present

## 2016-06-01 DIAGNOSIS — Z7982 Long term (current) use of aspirin: Secondary | ICD-10-CM | POA: Diagnosis not present

## 2016-06-01 DIAGNOSIS — I10 Essential (primary) hypertension: Secondary | ICD-10-CM | POA: Diagnosis not present

## 2016-06-01 DIAGNOSIS — K668 Other specified disorders of peritoneum: Secondary | ICD-10-CM | POA: Diagnosis not present

## 2016-06-01 DIAGNOSIS — C482 Malignant neoplasm of peritoneum, unspecified: Secondary | ICD-10-CM | POA: Diagnosis not present

## 2016-06-01 DIAGNOSIS — H3530 Unspecified macular degeneration: Secondary | ICD-10-CM | POA: Insufficient documentation

## 2016-06-01 DIAGNOSIS — I700 Atherosclerosis of aorta: Secondary | ICD-10-CM | POA: Insufficient documentation

## 2016-06-01 LAB — BASIC METABOLIC PANEL
Anion gap: 8 (ref 5–15)
BUN: 18 mg/dL (ref 6–20)
CHLORIDE: 103 mmol/L (ref 101–111)
CO2: 27 mmol/L (ref 22–32)
CREATININE: 0.8 mg/dL (ref 0.61–1.24)
Calcium: 8.8 mg/dL — ABNORMAL LOW (ref 8.9–10.3)
GFR calc Af Amer: >60 mL/min (ref >60)
GFR calc non Af Amer: >60 mL/min (ref >60)
Glucose, Bld: 129 mg/dL — ABNORMAL HIGH (ref 65–99)
Potassium: 3.2 mmol/L — ABNORMAL LOW (ref 3.5–5.1)
SODIUM: 138 mmol/L (ref 135–145)

## 2016-06-01 LAB — GLUCOSE, CAPILLARY: Glucose-Capillary: 119 mg/dL — ABNORMAL HIGH (ref 65–99)

## 2016-06-01 LAB — CBC WITH DIFFERENTIAL/PLATELET
Basophils Absolute: 0 10*3/uL (ref 0.0–0.1)
Basophils Relative: 0 %
EOS ABS: 0.2 10*3/uL (ref 0.0–0.7)
EOS PCT: 2 %
HCT: 33.3 % — ABNORMAL LOW (ref 39.0–52.0)
Hemoglobin: 11.1 g/dL — ABNORMAL LOW (ref 13.0–17.0)
LYMPHS ABS: 1.1 10*3/uL (ref 0.7–4.0)
Lymphocytes Relative: 12 %
MCH: 26.5 pg (ref 26.0–34.0)
MCHC: 33.3 g/dL (ref 30.0–36.0)
MCV: 79.5 fL (ref 78.0–100.0)
Monocytes Absolute: 0.7 10*3/uL (ref 0.1–1.0)
Monocytes Relative: 8 %
Neutro Abs: 6.6 10*3/uL (ref 1.7–7.7)
Neutrophils Relative %: 78 %
PLATELETS: 308 10*3/uL (ref 150–400)
RBC: 4.19 MIL/uL — AB (ref 4.22–5.81)
RDW: 16.8 % — ABNORMAL HIGH (ref 11.5–15.5)
WBC: 8.5 10*3/uL (ref 4.0–10.5)

## 2016-06-01 LAB — PROTIME-INR
INR: 1.08
PROTHROMBIN TIME: 14 s (ref 11.4–15.2)

## 2016-06-01 MED ORDER — SODIUM CHLORIDE 0.9 % IV SOLN
INTRAVENOUS | Status: DC
  Administered 2016-06-01: 08:00:00 via INTRAVENOUS

## 2016-06-01 MED ORDER — HYDROCODONE-ACETAMINOPHEN 5-325 MG PO TABS: 1.0000 | ORAL_TABLET | ORAL | Status: DC | PRN

## 2016-06-01 MED ORDER — MIDAZOLAM HCL 2 MG/2ML IJ SOLN
INTRAMUSCULAR | Status: AC
  Filled 2016-06-01: qty 6

## 2016-06-01 MED ORDER — FENTANYL CITRATE (PF) 100 MCG/2ML IJ SOLN
INTRAMUSCULAR | Status: AC | PRN
  Administered 2016-06-01 (×2): 25 ug via INTRAVENOUS
  Administered 2016-06-01 (×2): 50 ug via INTRAVENOUS

## 2016-06-01 MED ORDER — HEPARIN SOD (PORK) LOCK FLUSH 100 UNIT/ML IV SOLN
500.0000 [IU] | INTRAVENOUS | Status: AC | PRN
  Administered 2016-06-01: 500 [IU]
  Filled 2016-06-01: qty 5

## 2016-06-01 MED ORDER — MIDAZOLAM HCL 2 MG/2ML IJ SOLN
INTRAMUSCULAR | Status: AC | PRN
  Administered 2016-06-01: 0.5 mg via INTRAVENOUS
  Administered 2016-06-01 (×2): 1 mg via INTRAVENOUS
  Administered 2016-06-01: 0.5 mg via INTRAVENOUS
  Administered 2016-06-01: 1 mg via INTRAVENOUS
  Administered 2016-06-01: 0.5 mg via INTRAVENOUS

## 2016-06-01 MED ORDER — MIDAZOLAM HCL 2 MG/2ML IJ SOLN
INTRAMUSCULAR | Status: AC | PRN
  Administered 2016-06-01 (×3): 0.5 mg via INTRAVENOUS

## 2016-06-01 MED ORDER — FENTANYL CITRATE (PF) 100 MCG/2ML IJ SOLN
INTRAMUSCULAR | Status: AC
  Filled 2016-06-01: qty 6

## 2016-06-01 MED ORDER — FENTANYL CITRATE (PF) 100 MCG/2ML IJ SOLN
INTRAMUSCULAR | Status: AC | PRN
  Administered 2016-06-01: 50 ug via INTRAVENOUS

## 2016-06-01 NOTE — Sedation Documentation (Signed)
Patient is resting comfortably. Patient grimaces occassionally. NAD.

## 2016-06-01 NOTE — Sedation Documentation (Signed)
Patient is resting comfortably. 

## 2016-06-01 NOTE — Sedation Documentation (Signed)
Pt grimaces during procedure. Vitals stable. Medication administered.

## 2016-06-01 NOTE — Discharge Instructions (Signed)
Needle Biopsy, Care After °These instructions give you information about caring for yourself after your procedure. Your doctor may also give you more specific instructions. Call your doctor if you have any problems or questions after your procedure. °Follow these instructions at home: °· Rest as told by your doctor. °· Take medicines only as told by your doctor. °· There are many different ways to close and cover the biopsy site, including stitches (sutures), skin glue, and adhesive strips. Follow instructions from your doctor about: °¨ How to take care of your biopsy site. °¨ When and how you should change your bandage (dressing). °¨ When you should remove your dressing. °¨ Removing whatever was used to close your biopsy site. °· Check your biopsy site every day for signs of infection. Watch for: °¨ Redness, swelling, or pain. °¨ Fluid, blood, or pus. °Contact a doctor if: °· You have a fever. °· You have redness, swelling, or pain at the biopsy site, and it lasts longer than a few days. °· You have fluid, blood, or pus coming from the biopsy site. °· You feel sick to your stomach (nauseous). °· You throw up (vomit). °Get help right away if: °· You are short of breath. °· You have trouble breathing. °· Your chest hurts. °· You feel dizzy or you pass out (faint). °· You have bleeding that does not stop with pressure or a bandage. °· You cough up blood. °· Your belly (abdomen) hurts. °This information is not intended to replace advice given to you by your health care provider. Make sure you discuss any questions you have with your health care provider. °Document Released: 02/24/2008 Document Revised: 08/19/2015 Document Reviewed: 03/09/2014 °Elsevier Interactive Patient Education © 2017 [employer] ° ° °Moderate Conscious Sedation, Adult, Care After °These instructions provide you with information about caring for yourself after your procedure. Your health care provider may also give you more specific instructions.  Your treatment has been planned according to current medical practices, but problems sometimes occur. Call your health care provider if you have any problems or questions after your procedure. °What can I expect after the procedure? °After your procedure, it is common: °· To feel sleepy for several hours. °· To feel clumsy and have poor balance for several hours. °· To have poor judgment for several hours. °· To vomit if you eat too soon. °Follow these instructions at home: °For at least 24 hours after the procedure:  ° °· Do not: °¨ Participate in activities where you could fall or become injured. °¨ Drive. °¨ Use heavy machinery. °¨ Drink alcohol. °¨ Take sleeping pills or medicines that cause drowsiness. °¨ Make important decisions or sign legal documents. °¨ Take care of children on your own. °· Rest. °Eating and drinking  °· Follow the diet recommended by your health care provider. °· If you vomit: °¨ Drink water, juice, or soup when you can drink without vomiting. °¨ Make sure you have little or no nausea before eating solid foods. °General instructions  °· Have a responsible adult stay with you until you are awake and alert. °· Take over-the-counter and prescription medicines only as told by your health care provider. °· If you smoke, do not smoke without supervision. °· Keep all follow-up visits as told by your health care provider. This is important. °Contact a health care provider if: °· You keep feeling nauseous or you keep vomiting. °· You feel light-headed. °· You develop a rash. °· You have a fever. °Get help right   away if: °· You have trouble breathing. °This information is not intended to replace advice given to you by your health care provider. Make sure you discuss any questions you have with your health care provider. °Document Released: 01/01/2013 Document Revised: 08/16/2015 Document Reviewed: 07/03/2015 °Elsevier Interactive Patient Education © 2017 [employer] ° °

## 2016-06-01 NOTE — Discharge Instructions (Signed)
Liver Biopsy, Care After °These instructions give you information on caring for yourself after your procedure. Your doctor may also give you more specific instructions. Call your doctor if you have any problems or questions after your procedure. °Follow these instructions at home: °· Rest at home for 1-2 days or as told by your doctor. °· Have someone stay with you for at least 24 hours. °· Do not do these things in the first 24 hours: °¨ Drive. °¨ Use machinery. °¨ Take care of other people. °¨ Sign legal documents. °¨ Take a bath or shower. °· There are many different ways to close and cover a cut (incision). For example, a cut can be closed with stitches, skin glue, or adhesive strips. Follow your doctor's instructions on: °¨ Taking care of your cut. °¨ Changing and removing your bandage (dressing). °¨ Removing whatever was used to close your cut. °· Do not drink alcohol in the first week. °· Do not lift more than 5 pounds or play contact sports for the first 2 weeks. °· Take medicines only as told by your doctor. For 1 week, do not take medicine that has aspirin in it or medicines like ibuprofen. °· Get your test results. °Contact a doctor if: °· A cut bleeds and leaves more than just a small spot of blood. °· A cut is red, puffs up (swells), or hurts more than before. °· Fluid or something else comes from a cut. °· A cut smells bad. °· You have a fever or chills. °Get help right away if: °· You have swelling, bloating, or pain in your belly (abdomen). °· You get dizzy or faint. °· You have a rash. °· You feel sick to your stomach (nauseous) or throw up (vomit). °· You have trouble breathing, feel short of breath, or feel faint. °· Your chest hurts. °· You have problems talking or seeing. °· You have trouble balancing or moving your arms or legs. °This information is not intended to replace advice given to you by your health care provider. Make sure you discuss any questions you have with your health care  provider. °Document Released: 12/21/2007 Document Revised: 08/19/2015 Document Reviewed: 05/09/2013 °Elsevier Interactive Patient Education © 2017 [employer] °Moderate Conscious Sedation, Adult, Care After °These instructions provide you with information about caring for yourself after your procedure. Your health care provider may also give you more specific instructions. Your treatment has been planned according to current medical practices, but problems sometimes occur. Call your health care provider if you have any problems or questions after your procedure. °What can I expect after the procedure? °After your procedure, it is common: °· To feel sleepy for several hours. °· To feel clumsy and have poor balance for several hours. °· To have poor judgment for several hours. °· To vomit if you eat too soon. °Follow these instructions at home: °For at least 24 hours after the procedure:  ° °· Do not: °¨ Participate in activities where you could fall or become injured. °¨ Drive. °¨ Use heavy machinery. °¨ Drink alcohol. °¨ Take sleeping pills or medicines that cause drowsiness. °¨ Make important decisions or sign legal documents. °¨ Take care of children on your own. °· Rest. °Eating and drinking  °· Follow the diet recommended by your health care provider. °· If you vomit: °¨ Drink water, juice, or soup when you can drink without vomiting. °¨ Make sure you have little or no nausea before eating solid foods. °General instructions  °· Have   a responsible adult stay with you until you are awake and alert. °· Take over-the-counter and prescription medicines only as told by your health care provider. °· If you smoke, do not smoke without supervision. °· Keep all follow-up visits as told by your health care provider. This is important. °Contact a health care provider if: °· You keep feeling nauseous or you keep vomiting. °· You feel light-headed. °· You develop a rash. °· You have a fever. °Get help right away if: °· You  have trouble breathing. °This information is not intended to replace advice given to you by your health care provider. Make sure you discuss any questions you have with your health care provider. °Document Released: 01/01/2013 Document Revised: 08/16/2015 Document Reviewed: 07/03/2015 °Elsevier Interactive Patient Education © 2017 [employer] Inc. ° °

## 2016-06-01 NOTE — Procedures (Signed)
  Pre-operative Diagnosis: Peritoneal nodules with history of pancreatic and prostate cancer      Post-operative Diagnosis:  Peritoneal nodules with history of pancreatic and prostate cancer   Indications: Need tissue diagnosis  Procedure: CT and US guided peritoneal nodule biopsy  Findings: Small peritoneal nodules in right anterior abdomen.  Unable to puncture nodules with core needle or coaxial needle.  Used [place] to perform 8 FNAs of peritoneal nodules  Complications: None     EBL: Minimal  Plan: Bedrest 2 hours.

## 2016-06-01 NOTE — Consult Note (Signed)
Chief Complaint: Patient was seen in consultation today for CT  guided peritoneal nodule biopsy  Referring Physician(s): Feng,Yan  Supervising Physician: Markus Daft  Patient Status: [facility] - Out-pt  History of Present Illness: [name] is a 81 y.o. male with past medical history significant for prostate cancer, pancreatic cancer diagnosed in 2017, s/p chemoradiation and common hepatic duct stent placement. His CEA level is rising and recent imaging reveals:   Interval development of multiple soft tissue nodules within the anterior upper abdomen involving the right and left upper quadrants compatible with peritoneal metastatic disease. There is a small amount of new right perihepatic free fluid.  Interval development of intrahepatic biliary ductal dilatation as well as dilation of the gallbladder. There is tapering of the central bile ducts at the level of soft tissue heterogeneity near the porta hepatis which may represent a metastatic soft tissue mass or liver lesion.  Slight interval increase in size of hypoenhancing pancreatic body mass with surrounding infiltrative tissue.  Aortic atherosclerosis.  Sigmoid colonic diverticulosis  He presents today for CT-guided peritoneal nodule biopsy for further evaluation.  Past Medical History:  Diagnosis Date  . Arthritis    R hip and shoulder  . Complication of anesthesia    Local anesthetic used in office for Prostate biopsy" reaction Tachycardia,nausea, hallucinations, Blood pressure elevated"- No problems once done at hospital with anesthesia" Thinks Ciprofloxacin was the injection"   . Diabetes mellitus   . Enlarged prostate   . Femoral bruit    R femoral  . GERD with stricture    PMH of  . History of kidney stones   . Hyperlipidemia   . Hypertension   . Macular degeneration    glaucoma suspect  . Pancreatic [place] 09/2015   adenocarcinoma of pancreas  . PONV (postoperative nausea and vomiting)   .  Prostate cancer [facility])    prostate ; Dr Wyvonnia Lora to be monitored x 5 yrs now.  . Squamous papilloma    of esophagus    Past Surgical History:  Procedure Laterality Date  . ANKLE FUSION  2004  . COLONOSCOPY  05/2012  . ERCP N/A 05/25/2016   Procedure: ENDOSCOPIC RETROGRADE CHOLANGIOPANCREATOGRAPHY (ERCP);  Surgeon: Ladene Artist, MD;  Location: [facility] ENDOSCOPY;  Service: Endoscopy;  Laterality: N/A;  . esophageal dilation  2002   Dr  Lucio Edward  . ESOPHAGOGASTRODUODENOSCOPY (EGD) WITH PROPOFOL N/A 09/30/2015   Procedure: ESOPHAGOGASTRODUODENOSCOPY (EGD) WITH PROPOFOL;  Surgeon: Milus Banister, MD;  Location: [facility] ENDOSCOPY;  Service: Endoscopy;  Laterality: N/A;  . EUS N/A 09/30/2015   Procedure: UPPER ENDOSCOPIC ULTRASOUND (EUS) RADIAL;  Surgeon: Milus Banister, MD;  Location: [facility] ENDOSCOPY;  Service: Endoscopy;  Laterality: N/A;  . FINE NEEDLE ASPIRATION N/A 09/30/2015   Procedure: FINE NEEDLE ASPIRATION (FNA) LINEAR;  Surgeon: Milus Banister, MD;  Location: [facility] ENDOSCOPY;  Service: Endoscopy;  Laterality: N/A;  . HERNIA REPAIR     bil inguinal hernias  . PARTIAL COLECTOMY     perforated diverticulitis  . PORTACATH PLACEMENT Left 10/13/2015   Procedure: INSERTION PORT-A-CATH;  Surgeon: [name], MD;  Location: [facility] ORS;  Service: General;  Laterality: Left;  . PROSTATE BIOPSY  03/29/2012   Procedure: BIOPSY TRANSRECTAL ULTRASONIC PROSTATE (TUBP);  Surgeon: Fredricka Bonine, MD;  Location: [facility];  Service: Urology;  Laterality: N/A;  . ROTATOR CUFF REPAIR  2006  . TOTAL HIP ARTHROPLASTY Left 2012    Allergies: Ace inhibitors; Angiotensin receptor blockers; and  Ciprofloxacin  Medications: Prior to Admission medications   Medication Sig Start Date End Date Taking? Authorizing Provider  amLODipine (NORVASC) 10 MG tablet Take 1 tablet (10 mg total) by mouth daily. 11/01/15  Yes Binnie Rail, MD  aspirin 81 MG tablet Take 1 tablet (81 mg total) by mouth  daily. 03/31/12  Yes Festus Aloe, MD  Calcium Carbonate-Vitamin D (CALCIUM-D PO) Take 1 tablet by mouth daily.   Yes Historical Provider, MD  doxazosin (CARDURA) 1 MG tablet TAKE 1 TABLET BY MOUTH EVERY DAY 09/30/15  Yes Binnie Rail, MD  hydrochlorothiazide (MICROZIDE) 12.5 MG capsule Take 1 capsule (12.5 mg total) by mouth daily. 10/11/15  Yes Binnie Rail, MD  latanoprost (XALATAN) 0.005 % ophthalmic solution Place 1 drop into both eyes at bedtime. 05/25/15  Yes Historical Provider, MD  lidocaine-prilocaine (EMLA) cream Apply 1 application topically as needed. Apply to pac site 1 hour prior to stick and cover with plastic wrap Patient taking differently: Apply 1 application topically See admin instructions. Apply to pac site 1 hour prior to stick and cover with plastic wrap - approximately once a month 10/12/15  Yes Truitt Merle, MD  metFORMIN (GLUCOPHAGE) 500 MG tablet Take 1 tablet (500 mg total) by mouth 2 (two) times daily with a meal. 10/07/15  Yes Binnie Rail, MD  multivitamin-lutein [facility]) CAPS Take 1 capsule by mouth 2 (two) times daily.    Yes Historical Provider, MD  naproxen sodium (ALEVE) 220 MG tablet Take 220 mg by mouth daily as needed (pain/headache).   Yes Historical Provider, MD  OVER THE COUNTER MEDICATION Place 1 drop into both eyes daily. Over the counter lubricating eye drops   Yes Historical Provider, MD  potassium chloride 20 MEQ/15ML (10%) SOLN Take 15 mLs (20 mEq total) by mouth daily. 05/26/16  Yes Dron Tanna Furry, MD  pravastatin (PRAVACHOL) 20 MG tablet Take 1 tablet (20 mg total) by mouth at bedtime. 11/01/15  Yes Binnie Rail, MD  HYDROcodone-acetaminophen (NORCO) 5-325 MG tablet Take 1 tablet by mouth every 6 (six) hours as needed for moderate pain. 05/26/16   Dron Tanna Furry, MD  [facility] DELICA LANCETS 00X MISC Use to check blood sugars once a day Dx E11.9 09/27/15   Binnie Rail, MD  [facility] VERIO test strip CHECK BLOOD SUGAR ONCE DAILY. DX250.00  11/23/14   Hendricks Limes, MD  [name] VERIO test strip CHECK BLOOD SUGAR ONCE DAILY 02/07/16   Binnie Rail, MD  traMADol (ULTRAM) 50 MG tablet Take 0.5 tablets (25 mg total) by mouth every 6 (six) hours as needed. Patient taking differently: Take 25 mg by mouth every 6 (six) hours as needed (pain).  05/23/16   Truitt Merle, MD     Family History  Problem Relation Age of Onset  . Kidney disease Father     ? Rheumatic fever as child  . Hypertension Father   . Heart attack Paternal Uncle     in 49s  . COPD Paternal Uncle     due to exposures in [facility]  . Kidney disease Paternal Aunt     ? Rheumatic fever as child  . Cancer Neg Hx   . Diabetes Neg Hx   . Stroke Neg Hx   . Colon cancer Neg Hx     Social History   Social History  . Marital status: Married    Spouse name: Deloris  . Number of children: 1  . Years of education: N/A  Occupational History  . Retired     Ecologist in [place]  . Smoking status: Former Smoker    Packs/day: 0.50    Years: 40.00    Types: Cigarettes    Quit date: 03/27/1989  . Smokeless tobacco: Never Used     Comment: smoked [place], up to 1 ppd  . Alcohol use Yes     Comment: social  . Drug use: No  . Sexual activity: Not Asked   Other Topics Concern  . None   Social History Narrative   Married to wife, Deloris for 54+ years   Retired here from [employer] in [employer]   Enjoys outdoor activities and bikes 2 miles/day   Has one grown son and teen granddaughter in area      Review of Systems denies fever, headache, chest pain, dyspnea, cough, back pain, nausea, vomiting or abnormal bleeding. He does have intermittent abdominal pain and weight loss.  Vital Signs: BP (!) 147/72 (BP Location: Right Arm)   Pulse 86   Temp 97.7 F (36.5 C) (Oral)   SpO2 98%   Physical Exam awake, alert. Chest clear to auscultation bilaterally. Clean, intact left chest wall Port-A-Cath. Heart with regular  rate and rhythm. Abdomen soft, positive bowel sounds, mild diffuse tenderness,few palpable subcutaneous nodules; no LE edema  Mallampati Score:     Imaging: Ct Chest W Contrast  Result Date: 05/19/2016 CLINICAL DATA:  Patient with history of pancreatic cancer. Diffuse abdominal pain. EXAM: CT CHEST, ABDOMEN, AND PELVIS WITH CONTRAST TECHNIQUE: Multidetector CT imaging of the chest, abdomen and pelvis was performed following the standard protocol during bolus administration of intravenous contrast. CONTRAST:  111mL ISOVUE-300 IOPAMIDOL (ISOVUE-300) INJECTION 61% COMPARISON:  CT chest abdomen pelvis 03/23/2016. FINDINGS: CT CHEST FINDINGS Cardiovascular: Left anterior chest wall Port-A-Cath is present with tip terminating in the superior vena cava. Mediastinum/Nodes: No enlarged axillary, mediastinal or hilar lymphadenopathy. The esophagus is mildly patulous. Lungs/Pleura: Central airways are patent. Dependent atelectasis and or scarring within the lower lobes bilaterally. No large area pulmonary consolidation. Centrilobular and paraseptal emphysematous change. 5 mm calcified granuloma left upper lobe. No pleural effusion or pneumothorax. Musculoskeletal: No aggressive or acute appearing osseous lesions. CT ABDOMEN PELVIS FINDINGS Hepatobiliary: Liver is normal in size and contour. Interval development of intrahepatic biliary ductal dilatation. The gallbladder is distended. There is focal tapering of the bile ducts centrally within the liver were there is approximately 2.5 cm of abnormal appearing soft tissue in the porta hepatis (image 107; series 7). Pancreas: Two metallic fiducial markers re- demonstrated within the pancreatic body. Hypoenhancing pancreatic mass appears slightly increased when compared to prior exam measuring 2.5 x 1.4 cm (image 109; series 7), previously 1.6 x 2.0 cm. Upstream pancreatic ductal dilatation is similar. Stable infiltrative soft tissue extending superiorly from the pancreatic  body involve the proximal splenic artery and involve the proximal superior mesenteric artery as well as common hepatic artery. Spleen: Unremarkable. Adrenals/Urinary Tract: The adrenal glands are normal. Kidneys enhance symmetrically with contrast. Stable bilateral renal cysts. No hydronephrosis. Urinary bladder is decompressed. Stomach/Bowel: Sigmoid colonic diverticulosis. No CT evidence for acute diverticulitis. No abnormal bowel wall thickening or evidence for bowel obstruction. Small amount of perihepatic free fluid, new from prior. Vascular/Lymphatic: Peripheral calcified atherosclerotic plaque involving the abdominal aorta. No retroperitoneal lymphadenopathy. Marked narrowing of the splenic vein near the porta splenic confluence. Reproductive: Enlarged prostate gland. Other: Interval development of multiple peritoneal soft tissue nodules  within the anterior abdomen with a reference 1.4 cm nodule within the anterior right upper abdomen (image 124; series 7) and a reference 1.6 cm nodule within the anterior left upper abdomen (image 119; series 7). There is a 1.7 cm nodule anterior to the spleen (image 94; series 7). Musculoskeletal: Lumbar spine degenerative changes. Postsurgical changes proximal left femur. IMPRESSION: Interval development of multiple soft tissue nodules within the anterior upper abdomen involving the right and left upper quadrants compatible with peritoneal metastatic disease. There is a small amount of new right perihepatic free fluid. Interval development of intrahepatic biliary ductal dilatation as well as dilation of the gallbladder. There is tapering of the central bile ducts at the level of soft tissue heterogeneity near the porta hepatis which may represent a metastatic soft tissue mass or liver lesion. Slight interval increase in size of hypoenhancing pancreatic body mass with surrounding infiltrative tissue. Aortic atherosclerosis.  Sigmoid colonic diverticulosis. Electronically  Signed   By: Lovey Newcomer M.D.   On: 05/19/2016 10:47   Ct Abdomen Pelvis W Contrast  Result Date: 05/19/2016 CLINICAL DATA:  Patient with history of pancreatic cancer. Diffuse abdominal pain. EXAM: CT CHEST, ABDOMEN, AND PELVIS WITH CONTRAST TECHNIQUE: Multidetector CT imaging of the chest, abdomen and pelvis was performed following the standard protocol during bolus administration of intravenous contrast. CONTRAST:  19mL ISOVUE-300 IOPAMIDOL (ISOVUE-300) INJECTION 61% COMPARISON:  CT chest abdomen pelvis 03/23/2016. FINDINGS: CT CHEST FINDINGS Cardiovascular: Left anterior chest wall Port-A-Cath is present with tip terminating in the superior vena cava. Mediastinum/Nodes: No enlarged axillary, mediastinal or hilar lymphadenopathy. The esophagus is mildly patulous. Lungs/Pleura: Central airways are patent. Dependent atelectasis and or scarring within the lower lobes bilaterally. No large area pulmonary consolidation. Centrilobular and paraseptal emphysematous change. 5 mm calcified granuloma left upper lobe. No pleural effusion or pneumothorax. Musculoskeletal: No aggressive or acute appearing osseous lesions. CT ABDOMEN PELVIS FINDINGS Hepatobiliary: Liver is normal in size and contour. Interval development of intrahepatic biliary ductal dilatation. The gallbladder is distended. There is focal tapering of the bile ducts centrally within the liver were there is approximately 2.5 cm of abnormal appearing soft tissue in the porta hepatis (image 107; series 7). Pancreas: Two metallic fiducial markers re- demonstrated within the pancreatic body. Hypoenhancing pancreatic mass appears slightly increased when compared to prior exam measuring 2.5 x 1.4 cm (image 109; series 7), previously 1.6 x 2.0 cm. Upstream pancreatic ductal dilatation is similar. Stable infiltrative soft tissue extending superiorly from the pancreatic body involve the proximal splenic artery and involve the proximal superior mesenteric artery as  well as common hepatic artery. Spleen: Unremarkable. Adrenals/Urinary Tract: The adrenal glands are normal. Kidneys enhance symmetrically with contrast. Stable bilateral renal cysts. No hydronephrosis. Urinary bladder is decompressed. Stomach/Bowel: Sigmoid colonic diverticulosis. No CT evidence for acute diverticulitis. No abnormal bowel wall thickening or evidence for bowel obstruction. Small amount of perihepatic free fluid, new from prior. Vascular/Lymphatic: Peripheral calcified atherosclerotic plaque involving the abdominal aorta. No retroperitoneal lymphadenopathy. Marked narrowing of the splenic vein near the porta splenic confluence. Reproductive: Enlarged prostate gland. Other: Interval development of multiple peritoneal soft tissue nodules within the anterior abdomen with a reference 1.4 cm nodule within the anterior right upper abdomen (image 124; series 7) and a reference 1.6 cm nodule within the anterior left upper abdomen (image 119; series 7). There is a 1.7 cm nodule anterior to the spleen (image 94; series 7). Musculoskeletal: Lumbar spine degenerative changes. Postsurgical changes proximal left femur. IMPRESSION: Interval development of multiple  soft tissue nodules within the anterior upper abdomen involving the right and left upper quadrants compatible with peritoneal metastatic disease. There is a small amount of new right perihepatic free fluid. Interval development of intrahepatic biliary ductal dilatation as well as dilation of the gallbladder. There is tapering of the central bile ducts at the level of soft tissue heterogeneity near the porta hepatis which may represent a metastatic soft tissue mass or liver lesion. Slight interval increase in size of hypoenhancing pancreatic body mass with surrounding infiltrative tissue. Aortic atherosclerosis.  Sigmoid colonic diverticulosis. Electronically Signed   By: Lovey Newcomer M.D.   On: 05/19/2016 10:47   Dg Chest Port 1 View  Result Date:  05/25/2016 CLINICAL DATA:  Chest pain EXAM: PORTABLE CHEST 1 VIEW COMPARISON:  Chest radiograph October 13, 2015 and chest CT May 19, 2016 FINDINGS: Port-A-Cath tip is in the superior vena cava. No pneumothorax. There is no edema or consolidation. Heart size and pulmonary vascularity are normal. No adenopathy. There is atherosclerotic calcification in the aorta. There is postoperative change in the right shoulder. IMPRESSION: Port-A-Cath tip in superior vena cava. No edema or consolidation. There is aortic atherosclerosis. Electronically Signed   By: Lowella Grip III M.D.   On: 05/25/2016 15:12   Dg Ercp  Result Date: 05/26/2016 CLINICAL DATA:  Biliary obstruction and history of pancreatic carcinoma. EXAM: ERCP TECHNIQUE: Multiple spot images obtained with the fluoroscopic device and submitted for interpretation post-procedure. COMPARISON:  CT of the abdomen on 05/19/2016 FINDINGS: Imaging was obtained with a C-arm during the endoscopic procedure. This demonstrates cannulation of the common bile duct with contrast cholangiogram demonstrating a high-grade stricture of the common hepatic duct just below the confluence of right and left-sided intrahepatic bile ducts. After guidewire passage, an endoscopic biliary stent was placed extending across the stricture and into the duodenum. IMPRESSION: High-grade stricture of the common hepatic duct just below the confluence of right and left intrahepatic bile ducts. An endoscopic biliary stent was placed extending across the stricture. These images were submitted for radiologic interpretation only. Please see the procedural report for the amount of contrast and the fluoroscopy time utilized. Electronically Signed   By: Aletta Edouard M.D.   On: 05/26/2016 08:29   Dg Abd Portable 1v  Result Date: 05/25/2016 CLINICAL DATA:  Abdominal pain EXAM: PORTABLE ABDOMEN - 1 VIEW COMPARISON:  CT abdomen and pelvis May 19, 2016 FINDINGS: There is a biliary stent in the  right upper quadrant with apparent contrast within the common bile duct. There is moderate stool throughout colon. There is no bowel dilatation or air-fluid levels suggesting bowel obstruction. No evident free air. There is a total hip prosthesis on the left. IMPRESSION: The pre stent in the right upper quadrant. Bowel gas pattern unremarkable. Electronically Signed   By: Lowella Grip III M.D.   On: 05/25/2016 15:14    Labs:  CBC:  Recent Labs  05/23/16 0852 05/25/16 1429 05/26/16 0522 06/01/16 0750  WBC 7.0 10.4 10.0 8.5  HGB 12.6* 12.3* 11.5* 11.1*  HCT 36.2* 34.8* 33.4* 33.3*  PLT 247 266 260 308    COAGS: No results for input(s): INR, APTT in the last 8760 hours.  BMP:  Recent Labs  09/07/15 1513  05/23/16 0852 05/25/16 1429 05/26/16 0522 06/01/16 0750  NA 144  < > 135* 135 132* 138  K 3.4*  < > 3.4* 2.8* 3.2* 3.2*  CL 103  --   --  99* 96* 103  CO2 32  < >  25 24 26 27   GLUCOSE 191*  < > 232* 159* 162* 129*  BUN 24*  < > 18.6 14 9 18   CALCIUM 9.4  < > 9.4 9.0 8.7* 8.8*  CREATININE 1.26  < > 1.3 0.92 0.91 0.80  GFRNONAA  --   --   --  >60 >60 >60  GFRAA  --   --   --  >60 >60 >60  < > = values in this interval not displayed.  LIVER FUNCTION TESTS:  Recent Labs  04/25/16 0924 05/23/16 0852 05/25/16 1429 05/26/16 0522  BILITOT 0.47 16.32* 20.4* 10.4*  AST 23 156* 158* 104*  ALT 19 279* 230* 195*  ALKPHOS 79 491* 460* 402*  PROT 7.6 7.0 6.8 6.3*  ALBUMIN 4.0 3.2* 3.1* 3.0*    TUMOR MARKERS:  Recent Labs  09/30/15 0636  CA199 370*    Assessment and Plan:  81 y.o. male with past medical history significant for prostate cancer, pancreatic cancer diagnosed in 2017( s/p chemoradiation) and common hepatic duct stent placement. His CEA level is rising and recent imaging reveals: Interval development of multiple soft tissue nodules within the anterior upper abdomen involving the right and left upper quadrants compatible with peritoneal metastatic  disease. There is a small amount of new right perihepatic free fluid. Interval development of intrahepatic biliary ductal dilatation as well as dilation of the gallbladder. There is tapering of thecentral bile ducts at the level of soft tissue heterogeneity near the porta hepatis which may represent a metastatic soft tissue massor liver lesion.Slight interval increase in size of hypoenhancing pancreatic body mass with surrounding infiltrative tissue.Aorticatherosclerosis.Sigmoid colonic diverticulosis  He presents today for CT-guided peritoneal nodule biopsy for further evaluation.Risks and benefits discussed with the patient/family including, but not limited to bleeding, infection, damage to adjacent structures or low yield requiring additional tests.All of the patient's questions were answered, patient is agreeable to proceed. Consent signed and in chart.     Thank you for this interesting consult.  I greatly enjoyed meeting TIMMOTHY BARANOWSKI and look forward to participating in their care.  A copy of this report was sent to the requesting provider on this date.  Electronically Signed: D. Rowe Zaeem 06/01/2016, 8:30 AM   I spent a total of 25 minutes    in face to face in clinical consultation, greater than 50% of which was counseling/coordinating care for CT-guided peritoneal nodule biopsy

## 2016-06-05 ENCOUNTER — Ambulatory Visit (HOSPITAL_BASED_OUTPATIENT_CLINIC_OR_DEPARTMENT_OTHER): Payer: Medicare Other

## 2016-06-05 ENCOUNTER — Telehealth: Payer: Self-pay | Admitting: *Deleted

## 2016-06-05 ENCOUNTER — Other Ambulatory Visit: Payer: Self-pay | Admitting: *Deleted

## 2016-06-05 ENCOUNTER — Ambulatory Visit (HOSPITAL_BASED_OUTPATIENT_CLINIC_OR_DEPARTMENT_OTHER): Payer: Medicare Other | Admitting: Nurse Practitioner

## 2016-06-05 ENCOUNTER — Telehealth: Payer: Self-pay

## 2016-06-05 ENCOUNTER — Encounter: Payer: Self-pay | Admitting: Nurse Practitioner

## 2016-06-05 ENCOUNTER — Other Ambulatory Visit: Payer: Self-pay | Admitting: Nurse Practitioner

## 2016-06-05 VITALS — BP 135/68 | HR 99 | Temp 97.7°F | Resp 18 | Ht 70.0 in | Wt 126.8 lb

## 2016-06-05 DIAGNOSIS — R109 Unspecified abdominal pain: Secondary | ICD-10-CM | POA: Diagnosis not present

## 2016-06-05 DIAGNOSIS — E860 Dehydration: Secondary | ICD-10-CM | POA: Diagnosis not present

## 2016-06-05 DIAGNOSIS — C251 Malignant neoplasm of body of pancreas: Secondary | ICD-10-CM

## 2016-06-05 DIAGNOSIS — Z5111 Encounter for antineoplastic chemotherapy: Secondary | ICD-10-CM

## 2016-06-05 LAB — CBC WITH DIFFERENTIAL/PLATELET
BASO%: 0.2 % (ref 0.0–2.0)
Basophils Absolute: 0 10*3/uL (ref 0.0–0.1)
EOS ABS: 0.1 10*3/uL (ref 0.0–0.5)
EOS%: 0.5 % (ref 0.0–7.0)
HCT: 34.8 % — ABNORMAL LOW (ref 38.4–49.9)
HEMOGLOBIN: 11.5 g/dL — AB (ref 13.0–17.1)
LYMPH%: 8.4 % — AB (ref 14.0–49.0)
MCH: 27.1 pg — ABNORMAL LOW (ref 27.2–33.4)
MCHC: 33 g/dL (ref 32.0–36.0)
MCV: 81.9 fL (ref 79.3–98.0)
MONO#: 1.1 10*3/uL — AB (ref 0.1–0.9)
MONO%: 9.6 % (ref 0.0–14.0)
NEUT%: 81.3 % — ABNORMAL HIGH (ref 39.0–75.0)
NEUTROS ABS: 8.9 10*3/uL — AB (ref 1.5–6.5)
PLATELETS: 287 10*3/uL (ref 140–400)
RBC: 4.25 10*6/uL (ref 4.20–5.82)
RDW: 16.3 % — AB (ref 11.0–14.6)
WBC: 11 10*3/uL — AB (ref 4.0–10.3)
lymph#: 0.9 10*3/uL (ref 0.9–3.3)

## 2016-06-05 LAB — COMPREHENSIVE METABOLIC PANEL
ALBUMIN: 3 g/dL — AB (ref 3.5–5.0)
ALK PHOS: 227 U/L — AB (ref 40–150)
ALT: 68 U/L — AB (ref 0–55)
ANION GAP: 11 meq/L (ref 3–11)
AST: 35 U/L — ABNORMAL HIGH (ref 5–34)
BILIRUBIN TOTAL: 3.82 mg/dL — AB (ref 0.20–1.20)
BUN: 12.9 mg/dL (ref 7.0–26.0)
CO2: 27 meq/L (ref 22–29)
Calcium: 9.5 mg/dL (ref 8.4–10.4)
Chloride: 100 mEq/L (ref 98–109)
Creatinine: 1 mg/dL (ref 0.7–1.3)
EGFR: 82 mL/min/{1.73_m2} — ABNORMAL LOW (ref >90)
GLUCOSE: 144 mg/dL — AB (ref 70–140)
Potassium: 3.9 mEq/L (ref 3.5–5.1)
Sodium: 137 mEq/L (ref 136–145)
TOTAL PROTEIN: 7.2 g/dL (ref 6.4–8.3)

## 2016-06-05 MED ORDER — SODIUM CHLORIDE 0.9 % IV SOLN
Freq: Once | INTRAVENOUS | Status: AC
  Administered 2016-06-05: 13:00:00 via INTRAVENOUS

## 2016-06-05 MED ORDER — OXYCODONE HCL 5 MG PO TABS: ORAL_TABLET | ORAL | 0 refills | Status: AC

## 2016-06-05 MED ORDER — SODIUM CHLORIDE 0.9 % IV SOLN
Freq: Once | INTRAVENOUS | Status: AC
  Administered 2016-06-05: 14:00:00 via INTRAVENOUS

## 2016-06-05 MED ORDER — HEPARIN SOD (PORK) LOCK FLUSH 100 UNIT/ML IV SOLN
500.0000 [IU] | Freq: Once | INTRAVENOUS | Status: DC | PRN
  Filled 2016-06-05: qty 5

## 2016-06-05 MED ORDER — SODIUM CHLORIDE 0.9 % IV SOLN
800.0000 mg/m2 | Freq: Once | INTRAVENOUS | Status: AC
  Administered 2016-06-05: 1368 mg via INTRAVENOUS
  Filled 2016-06-05: qty 35.98

## 2016-06-05 MED ORDER — SODIUM CHLORIDE 0.9% FLUSH
10.0000 mL | INTRAVENOUS | Status: DC | PRN
  Filled 2016-06-05: qty 10

## 2016-06-05 MED ORDER — MORPHINE SULFATE 4 MG/ML IJ SOLN
2.0000 mg | Freq: Once | INTRAMUSCULAR | Status: AC
Start: 2016-06-05 — End: 2016-06-05
  Administered 2016-06-05: 2 mg via INTRAVENOUS
  Filled 2016-06-05: qty 1

## 2016-06-05 MED ORDER — MORPHINE SULFATE (PF) 4 MG/ML IV SOLN
INTRAVENOUS | Status: AC
  Filled 2016-06-05: qty 1

## 2016-06-05 MED ORDER — PROCHLORPERAZINE MALEATE 10 MG PO TABS
ORAL_TABLET | ORAL | Status: AC
  Filled 2016-06-05: qty 1

## 2016-06-05 MED ORDER — HEPARIN SOD (PORK) LOCK FLUSH 100 UNIT/ML IV SOLN
500.0000 [IU] | Freq: Once | INTRAVENOUS | Status: AC
  Administered 2016-06-05: 500 [IU] via INTRAVENOUS
  Filled 2016-06-05: qty 5

## 2016-06-05 MED ORDER — SODIUM CHLORIDE 0.9% FLUSH
10.0000 mL | Freq: Once | INTRAVENOUS | Status: AC
  Administered 2016-06-05: 10 mL via INTRAVENOUS
  Filled 2016-06-05: qty 10

## 2016-06-05 MED ORDER — PACLITAXEL PROTEIN-BOUND CHEMO INJECTION 100 MG
80.0000 mg/m2 | Freq: Once | INTRAVENOUS | Status: AC
  Administered 2016-06-05: 150 mg via INTRAVENOUS
  Filled 2016-06-05: qty 30

## 2016-06-05 MED ORDER — PROCHLORPERAZINE MALEATE 10 MG PO TABS
10.0000 mg | ORAL_TABLET | Freq: Once | ORAL | Status: AC
  Administered 2016-06-05: 10 mg via ORAL

## 2016-06-05 NOTE — Telephone Encounter (Signed)
Wife called stating they would be at [facility] about 11 am. See Elfredia Nevins telephone note today.

## 2016-06-05 NOTE — Progress Notes (Signed)
RN visit for IV fluids.  Pt transferred to infusion room for chemo via w/c. Wife with patient. Report given to [facility] , RN regarding pain meds and IV fluid status

## 2016-06-05 NOTE — Telephone Encounter (Signed)
Patient's wife called.  I spoke with both of them.  Patient has had a sudden increase in pain in his abdomen.  He states it is all across his abdomen. If feels hard and it hurts to the touch.   He took 1/2 tramadol at 5am and then a whole one at 8:30am with no relief now at 9:30am.  He rates his pain at a 9 or 10 of 10.  He does not seem to have any of the Norco on his medication list.  Discussed with Dr. Burr Medico.  Will have patient do lab and then see Selena Lesser NP.   Wife will call me back when she knows what time they can arrange transportation.

## 2016-06-05 NOTE — Progress Notes (Signed)
SYMPTOM MANAGEMENT CLINIC    Chief Complaint: Abdominal pain  HPI:  [name] 81 y.o. male diagnosed with pancreatic cancer.  Scheduled to initiate gemcitabine/Abraxane chemotherapy regimen this week.   Oncology History   Primary pancreatic cancer [facility])   Staging form: Pancreas, AJCC 7th Edition     Clinical stage from 09/30/2015: Stage IIB (T2, N1, M0) - Signed by Truitt Merle, MD on 10/07/2015       Malignant neoplasm of body of pancreas ([place])   09/30/2015 Initial Diagnosis    Primary pancreatic cancer ([place])      09/30/2015 Initial Biopsy     endoscopic pancreatic body mass fine-needle aspiration show malignant cells consistent with adenocarcinoma.      09/30/2015 Procedure     EUS showe a 2.5 cm pancreatic mass causing a stream the dictation of the main pancreatic duct, and directly abutting the SMA an      10/06/2015 Imaging     CT chest, abdomen and  pelvis showed a  4 cm mass in the body of  pancreas, tumor abut the anterior wall of the SMA, a 1.4cm  he pedal duoligament lymph nodes is suspicious, 49m RUL lung nodule is nonspecific, no other distant mets      10/15/2015 -  Chemotherapy    neoadjuvant chemo gemcitabine 10035mm2 and abraxane 12025m2, on day 1, 8 every 21 days, held after 11/30/2015 due to radiation      01/12/2016 - 01/21/2016 Radiation Therapy    SBRT to pancreatic cancer, 33 Gy in 5 fractions        05/19/2016 Imaging    CT CAP w contrast Interval development of multiple soft tissue nodules within the anterior upper abdomen involving the right and left upper quadrants compatible with peritoneal metastatic disease. There is a small amount of new right perihepatic free fluid. Interval development of intrahepatic biliary ductal dilatation as well as dilation of the gallbladder. There is tapering of the central bile ducts at the level of soft tissue heterogeneity near the porta hepatis which may represent a metastatic soft tissue mass or liver lesion. Slight  interval increase in size of hypoenhancing pancreatic body mass with surrounding infiltrative tissue. Aortic atherosclerosis.  Sigmoid colonic diverticulosis.      05/25/2016 Procedure    ENDOSCOPIC RETROGRADE CHOLANGIOPANCREATOGRAPHY (ERCP) The patient underwent ERCP on 05/25/16 with finding of severe extrinsic malignant-looking common bile duct stricture. Dr. StaFuller Planaced stent across the stricture.       Review of Systems  Constitutional: Positive for malaise/fatigue and weight loss.  Gastrointestinal: Positive for abdominal pain.  All other systems reviewed and are negative.   Past Medical History:  Diagnosis Date  . Arthritis    R hip and shoulder  . Complication of anesthesia    Local anesthetic used in office for Prostate biopsy" reaction Tachycardia,nausea, hallucinations, Blood pressure elevated"- No problems once done at hospital with anesthesia" Thinks Ciprofloxacin was the injection"   . Diabetes mellitus   . Enlarged prostate   . Femoral bruit    R femoral  . GERD with stricture    PMH of  . History of kidney stones   . Hyperlipidemia   . Hypertension   . Macular degeneration    glaucoma suspect  . Pancreatic cancer ([place]   adenocarcinoma of pancreas  . PONV (postoperative nausea and vomiting)   . [facility]  [facility] ; Dr EskWyvonnia Lora be monitored x 5 yrs now.  . Squamous papilloma  of esophagus    Past Surgical History:  Procedure Laterality Date  . ANKLE FUSION  2004  . COLONOSCOPY  05/2012  . ERCP N/A 05/25/2016   Procedure: ENDOSCOPIC RETROGRADE CHOLANGIOPANCREATOGRAPHY (ERCP);  Surgeon: Ladene Artist, MD;  Location: [name] ENDOSCOPY;  Service: Endoscopy;  Laterality: N/A;  . esophageal dilation  2002   Dr  Lucio Edward  . ESOPHAGOGASTRODUODENOSCOPY (EGD) WITH PROPOFOL N/A 09/30/2015   Procedure: ESOPHAGOGASTRODUODENOSCOPY (EGD) WITH PROPOFOL;  Surgeon: Milus Banister, MD;  Location: [facility] ENDOSCOPY;  Service: Endoscopy;   Laterality: N/A;  . EUS N/A 09/30/2015   Procedure: UPPER ENDOSCOPIC ULTRASOUND (EUS) RADIAL;  Surgeon: Milus Banister, MD;  Location: [facility] ENDOSCOPY;  Service: Endoscopy;  Laterality: N/A;  . FINE NEEDLE ASPIRATION N/A 09/30/2015   Procedure: FINE NEEDLE ASPIRATION (FNA) LINEAR;  Surgeon: Milus Banister, MD;  Location: [facility] ENDOSCOPY;  Service: Endoscopy;  Laterality: N/A;  . HERNIA REPAIR     bil inguinal hernias  . PARTIAL COLECTOMY     perforated diverticulitis  . PORTACATH PLACEMENT Left 10/13/2015   Procedure: INSERTION PORT-A-CATH;  Surgeon: [name], MD;  Location: [facility] ORS;  Service: General;  Laterality: Left;  . PROSTATE BIOPSY  03/29/2012   Procedure: BIOPSY TRANSRECTAL ULTRASONIC PROSTATE (TUBP);  Surgeon: Fredricka Bonine, MD;  Location: [facility];  Service: Urology;  Laterality: N/A;  . ROTATOR CUFF REPAIR  2006  . TOTAL HIP ARTHROPLASTY Left 2012    has Diabetes type 2, controlled ([place]); Hyperlipidemia; Essential hypertension; DIVERTICULOSIS, COLON; ANGIOEDEMA; PROSTATE CANCER, HX OF; COLONIC POLYPS, HX OF; Hypokalemia; Dysphagia, pharyngoesophageal phase; Glaucoma; Macular degeneration; AP (abdominal pain); Malignant neoplasm of body of pancreas ([place]); Bilateral leg edema; Port catheter in place; Biliary obstruction; Elevated LFTs; Abnormal CT scan, gastrointestinal tract; Post-op pain; Acute pancreatitis; and Dehydration on his problem list.    is allergic to ace inhibitors; angiotensin receptor blockers; and ciprofloxacin.  Allergies as of 06/05/2016      Reactions   Ace Inhibitors Swelling   Swelling of lips - reaction to Benazepril   Angiotensin Receptor Blockers    These are contraindicated because of angioedema with ACE inhibitors.   Ciprofloxacin Anaphylaxis   REACTION: anorexia , weakness, blurred vision      Medication List       Accurate as of 06/05/16 12:28 PM. Always use your most recent med list.          ALEVE 220 MG tablet Generic  drug:  naproxen sodium Take 220 mg by mouth daily as needed (pain/headache).   amLODipine 10 MG tablet Commonly known as:  NORVASC Take 1 tablet (10 mg total) by mouth daily.   aspirin 81 MG tablet Take 1 tablet (81 mg total) by mouth daily.   CALCIUM-D PO Take 1 tablet by mouth daily.   doxazosin 1 MG tablet Commonly known as:  CARDURA TAKE 1 TABLET BY MOUTH EVERY DAY   hydrochlorothiazide 12.5 MG capsule Commonly known as:  MICROZIDE Take 1 capsule (12.5 mg total) by mouth daily.   HYDROcodone-acetaminophen 5-325 MG tablet Commonly known as:  NORCO Take 1 tablet by mouth every 6 (six) hours as needed for moderate pain.   latanoprost 0.005 % ophthalmic solution Commonly known as:  XALATAN Place 1 drop into both eyes at bedtime.   lidocaine-prilocaine cream Commonly known as:  EMLA Apply 1 application topically as needed. Apply to pac site 1 hour prior to stick and cover with plastic wrap   metFORMIN 500 MG tablet Commonly known  as:  GLUCOPHAGE Take 1 tablet (500 mg total) by mouth 2 (two) times daily with a meal.   multivitamin-lutein Caps capsule Take 1 capsule by mouth 2 (two) times daily.   ONETOUCH DELICA LANCETS 33G Misc Use to check blood sugars once a day Dx E11.9   ONETOUCH VERIO test strip Generic drug:  glucose blood CHECK BLOOD SUGAR ONCE DAILY. DX250.00   ONETOUCH VERIO test strip Generic drug:  glucose blood CHECK BLOOD SUGAR ONCE DAILY   OVER THE COUNTER MEDICATION Place 1 drop into both eyes daily. Over the counter lubricating eye drops   oxyCODONE 5 MG immediate release tablet Commonly known as:  Oxy IR/ROXICODONE 1-2 tabs PO Q 6 hours PRN pain   potassium chloride 20 MEQ/15ML (10%) Soln Take 15 mLs (20 mEq total) by mouth daily.   pravastatin 20 MG tablet Commonly known as:  PRAVACHOL Take 1 tablet (20 mg total) by mouth at bedtime.   traMADol 50 MG tablet Commonly known as:  ULTRAM Take 0.5 tablets (25 mg total) by mouth every 6  (six) hours as needed.        PHYSICAL EXAMINATION  Oncology Vitals 06/05/2016 06/01/2016  Height 178 cm -  Weight 57.516 kg -  Weight (lbs) 126 lbs 13 oz -  BMI (kg/m2) 18.19 kg/m2 -  Temp 97.7 98.2  Pulse 99 77  Resp 18 17  SpO2 96 100  BSA (m2) 1.69 m2 -   BP Readings from Last 2 Encounters:  06/05/16 135/68  06/01/16 130/60    Physical Exam  Constitutional: He is oriented to person, place, and time. Vital signs are normal. He appears malnourished and dehydrated. He appears unhealthy. He appears cachectic.  HENT:  Head: Normocephalic and atraumatic.  Mouth/Throat: Oropharynx is clear and moist.  Eyes: Conjunctivae and EOM are normal. Pupils are equal, round, and reactive to light. Right eye exhibits no discharge. Left eye exhibits no discharge. No scleral icterus.  Neck: Normal range of motion. Neck supple. No JVD present. No tracheal deviation present. No thyromegaly present.  Cardiovascular: Normal rate, regular rhythm, normal heart sounds and intact distal pulses.   Pulmonary/Chest: Effort normal and breath sounds normal. No respiratory distress. He has no wheezes. He has no rales. He exhibits no tenderness.  Abdominal: Soft. He exhibits distension. He exhibits no mass. There is tenderness. There is guarding. There is no rebound.  Exam today reveals slightly hypoactive bowel sounds; although bowel sounds, 2 audible.  Some slight guarding of generalized abdomen; with a firm mass to the upper central abdomen.  This area is more tender with palpation.  There is no flank pain.        Musculoskeletal: Normal range of motion. He exhibits no edema, tenderness or deformity.  Lymphadenopathy:    He has no cervical adenopathy.  Neurological: He is alert and oriented to person, place, and time. Gait normal.  Skin: Skin is warm and dry. No rash noted. No erythema. No pallor.  Psychiatric: Affect normal.  Nursing note and vitals reviewed.   LABORATORY DATA:. Appointment on  06/05/2016  Component Date Value Ref Range Status  . WBC 06/05/2016 11.0* 4.0 - 10.3 10e3/uL Final  . NEUT# 06/05/2016 8.9* 1.5 - 6.5 10e3/uL Final  . HGB 06/05/2016 11.5* 13.0 - 17.1 g/dL Final  . HCT 74/21/3213 34.8* 38.4 - 49.9 % Final  . Platelets 06/05/2016 287  140 - 400 10e3/uL Final  . MCV 06/05/2016 81.9  79.3 - 98.0 fL Final  . MCH 06/05/2016 27.1*  27.2 - 33.4 pg Final  . MCHC 06/05/2016 33.0  32.0 - 36.0 g/dL Final  . RBC 06/05/2016 4.25  4.20 - 5.82 10e6/uL Final  . RDW 06/05/2016 16.3* 11.0 - 14.6 % Final  . lymph# 06/05/2016 0.9  0.9 - 3.3 10e3/uL Final  . MONO# 06/05/2016 1.1* 0.1 - 0.9 10e3/uL Final  . Eosinophils Absolute 06/05/2016 0.1  0.0 - 0.5 10e3/uL Final  . Basophils Absolute 06/05/2016 0.0  0.0 - 0.1 10e3/uL Final  . NEUT% 06/05/2016 81.3* 39.0 - 75.0 % Final  . LYMPH% 06/05/2016 8.4* 14.0 - 49.0 % Final  . MONO% 06/05/2016 9.6  0.0 - 14.0 % Final  . EOS% 06/05/2016 0.5  0.0 - 7.0 % Final  . BASO% 06/05/2016 0.2  0.0 - 2.0 % Final  . Sodium 06/05/2016 137  136 - 145 mEq/L Final  . Potassium 06/05/2016 3.9  3.5 - 5.1 mEq/L Final  . Chloride 06/05/2016 100  98 - 109 mEq/L Final  . CO2 06/05/2016 27  22 - 29 mEq/L Final  . Glucose 06/05/2016 144* 70 - 140 mg/dl Final  . BUN 06/05/2016 12.9  7.0 - 26.0 mg/dL Final  . Creatinine 06/05/2016 1.0  0.7 - 1.3 mg/dL Final  . Total Bilirubin 06/05/2016 3.82* 0.20 - 1.20 mg/dL Final  . Alkaline Phosphatase 06/05/2016 227* 40 - 150 U/L Final  . AST 06/05/2016 35* 5 - 34 U/L Final  . ALT 06/05/2016 68* 0 - 55 U/L Final  . Total Protein 06/05/2016 7.2  6.4 - 8.3 g/dL Final  . Albumin 06/05/2016 3.0* 3.5 - 5.0 g/dL Final  . Calcium 06/05/2016 9.5  8.4 - 10.4 mg/dL Final  . Anion Gap 06/05/2016 11  3 - 11 mEq/L Final  . EGFR 06/05/2016 82* >90 ml/min/1.73 m2 Final    RADIOGRAPHIC STUDIES: No results found.  ASSESSMENT/PLAN:    Malignant neoplasm of body of pancreas [facility]) Patient last received chemotherapy on  12/10/2015.  Unfortunately-recent restaging CT obtained on 05/19/2016 revealed reoccurrence of disease.  Patient is scheduled to return tomorrow for his first cycle of gemcitabine/Abraxane chemotherapy regimen.  Patient called the cancer Center this morning complaining of new onset diffuse abdominal discomfort rated at a 9-10 on the pain scale.  He states he has taken some tramadol with minimal to no effectiveness.  He denies any nausea or vomiting.  He states his last normal bowel movement was Saturday.  Patient denies any UTI symptoms.  He states that he has had little appetite and feels dehydrated today.  He denies any recent fevers or chills.  Exam today reveals slightly hypoactive bowel sounds; although bowel sounds audible.  Some slight guarding of generalized abdomen; with a firm mass to the upper central abdomen.  This area is more tender with palpation.  There is no flank pain.  Dr. Burr Medico in to examine patient as well.  Vital signs are stable and patient is afebrile.  Labs obtained today reveal that bilirubin has decreased from 20.4 down to 3.82.  Of note-patient underwent an ERCP with biliary stent placement on 05/26/2016.  He also underwent an abdominal biopsy of the peritoneal nodules this past Thursday, 06/01/2016.  The plan is for the patient to receive IV fluid rehydration while the cancer Center today.  If there is an opening this afternoon in the chemotherapy infusion room-patient will proceed with his chemotherapy today instead of tomorrow.  If there is no chemotherapy infusion availabilities today; patient will return to more for his first cycle of chemotherapy.  Will cancel the labs and a follow-up visit scheduled for 06/06/2016; since patient was seen and had labs today.  Patient was given morphine IV while at the [facility] per his request.  Also, will prescribe oxycodone for pain on a when necessary basis. Also, patient was advised to go directly to the emergency department  for any worsening symptoms whatsoever.  Dehydration Patient states he's had decreased appetite and decreased oral intake as well.  He feels mildly dehydrated today.  Patient will receive IV fluid rehydration today.  He was also encouraged to push fluids at home is much as possible.   Patient stated understanding of all instructions; and was in agreement with this plan of care. The patient knows to call the clinic with any problems, questions or concerns.   Total time spent with patient was 30 minutes;  with greater than 50% percent of that time spent in face to face counseling regarding patient's symptoms,  and coordination of care and follow up.  Disclaimer:This dictation was prepared with Dragon/digital dictation along with [employer]. Any transcriptional errors that result from this process are unintentional.  Drue Second, NP 06/05/2016    Addendum, I have seen the patient, examined him. I agree with the assessment and and plan and have edited the notes.   His abdominal pain is likely related to his underlying malignancy. No high suspicion for bowel obstruction. He was initially scheduled for chemotherapy tomorrow, we'll move to today. Lab results reviewed, his hyperbilirubinemia has improved after stent placement, total bilirubin 3.8 today, we'll reduce his Abraxane to 800 mg/m, and gemcitabine to 800 mg/m, dose discussed with pharmacy.  Will give him oxycodone 86m prn for his pain control.   I plan to see him back next week before day 8 chemotherapy.  FTruitt Merle 06/05/2016

## 2016-06-05 NOTE — Patient Instructions (Signed)
Dehydration, Adult Dehydration is a condition in which there is not enough fluid or water in the body. This happens when you lose more fluids than you take in. Important organs, such as the kidneys, brain, and heart, cannot function without a proper amount of fluids. Any loss of fluids from the body can lead to dehydration. Dehydration can range from mild to severe. This condition should be treated right away to prevent it from becoming severe. What are the causes? This condition may be caused by:  Vomiting.  Diarrhea.  Excessive sweating, such as from heat exposure or exercise.  Not drinking enough fluid, especially:  When ill.  While doing activity that requires a lot of energy.  Excessive urination.  Fever.  Infection.  Certain medicines, such as medicines that cause the body to lose excess fluid (diuretics).  Inability to access safe drinking water.  Reduced physical ability to get adequate water and food. What increases the risk? This condition is more likely to develop in people:  Who have a poorly controlled long-term (chronic) illness, such as diabetes, heart disease, or kidney disease.  Who are age 65 or older.  Who are disabled.  Who live in a place with high altitude.  Who play endurance sports. What are the signs or symptoms? Symptoms of mild dehydration may include:   Thirst.  Dry lips.  Slightly dry mouth.  Dry, warm skin.  Dizziness. Symptoms of moderate dehydration may include:   Very dry mouth.  Muscle cramps.  Dark urine. Urine may be the color of tea.  Decreased urine production.  Decreased tear production.  Heartbeat that is irregular or faster than normal (palpitations).  Headache.  Light-headedness, especially when you stand up from a sitting position.  Fainting (syncope). Symptoms of severe dehydration may include:   Changes in skin, such as:  Cold and clammy skin.  Blotchy (mottled) or pale skin.  Skin that does  not quickly return to normal after being lightly pinched and released (poor skin turgor).  Changes in body fluids, such as:  Extreme thirst.  No tear production.  Inability to sweat when body temperature is high, such as in hot weather.  Very little urine production.  Changes in vital signs, such as:  Weak pulse.  Pulse that is more than 100 beats a minute when sitting still.  Rapid breathing.  Low blood pressure.  Other changes, such as:  Sunken eyes.  Cold hands and feet.  Confusion.  Lack of energy (lethargy).  Difficulty waking up from sleep.  Short-term weight loss.  Unconsciousness. How is this diagnosed? This condition is diagnosed based on your symptoms and a physical exam. Blood and urine tests may be done to help confirm the diagnosis. How is this treated? Treatment for this condition depends on the severity. Mild or moderate dehydration can often be treated at home. Treatment should be started right away. Do not wait until dehydration becomes severe. Severe dehydration is an emergency and it needs to be treated in a hospital. Treatment for mild dehydration may include:   Drinking more fluids.  Replacing salts and minerals in your blood (electrolytes) that you may have lost. Treatment for moderate dehydration may include:   Drinking an oral rehydration solution (ORS). This is a drink that helps you replace fluids and electrolytes (rehydrate). It can be found at pharmacies and retail stores. Treatment for severe dehydration may include:   Receiving fluids through an IV tube.  Receiving an electrolyte solution through a feeding tube that is   passed through your nose and into your stomach (nasogastric tube, or NG tube).  Correcting any abnormalities in electrolytes.  Treating the underlying cause of dehydration. Follow these instructions at home:  If directed by your health care provider, drink an ORS:  Make an ORS by following instructions on the  package.  Start by drinking small amounts, about  cup (120 mL) every 5-10 minutes.  Slowly increase how much you drink until you have taken the amount recommended by your health care provider.  Drink enough clear fluid to keep your urine clear or pale yellow. If you were told to drink an ORS, finish the ORS first, then start slowly drinking other clear fluids. Drink fluids such as:  Water. Do not drink only water. Doing that can lead to having too little salt (sodium) in the body (hyponatremia).  Ice chips.  Fruit juice that you have added water to (diluted fruit juice).  Low-calorie sports drinks.  Avoid:  Alcohol.  Drinks that contain a lot of sugar. These include high-calorie sports drinks, fruit juice that is not diluted, and soda.  Caffeine.  Foods that are greasy or contain a lot of fat or sugar.  Take over-the-counter and prescription medicines only as told by your health care provider.  Do not take sodium tablets. This can lead to having too much sodium in the body (hypernatremia).  Eat foods that contain a healthy balance of electrolytes, such as bananas, oranges, potatoes, tomatoes, and spinach.  Keep all follow-up visits as told by your health care provider. This is important. Contact a health care provider if:  You have abdominal pain that:  Gets worse.  Stays in one area (localizes).  You have a rash.  You have a stiff neck.  You are more irritable than usual.  You are sleepier or more difficult to wake up than usual.  You feel weak or dizzy.  You feel very thirsty.  You have urinated only a small amount of very dark urine over 6-8 hours. Get help right away if:  You have symptoms of severe dehydration.  You cannot drink fluids without vomiting.  Your symptoms get worse with treatment.  You have a fever.  You have a severe headache.  You have vomiting or diarrhea that:  Gets worse.  Does not go away.  You have blood or green matter  (bile) in your vomit.  You have blood in your stool. This may cause stool to look black and tarry.  You have not urinated in 6-8 hours.  You faint.  Your heart rate while sitting still is over 100 beats a minute.  You have trouble breathing. This information is not intended to replace advice given to you by your health care provider. Make sure you discuss any questions you have with your health care provider. Document Released: 03/13/2005 Document Revised: 10/08/2015 Document Reviewed: 05/07/2015 Elsevier Interactive Patient Education  2017 Elsevier Inc.  

## 2016-06-05 NOTE — Assessment & Plan Note (Addendum)
Patient last received chemotherapy on 12/10/2015.  Unfortunately-recent restaging CT obtained on 05/19/2016 revealed reoccurrence of disease.  Patient is scheduled to return tomorrow for his first cycle of gemcitabine/Abraxane chemotherapy regimen.  Patient called the cancer Center this morning complaining of new onset diffuse abdominal discomfort rated at a 9-10 on the pain scale.  He states he has taken some tramadol with minimal to no effectiveness.  He denies any nausea or vomiting.  He states his last normal bowel movement was Saturday.  Patient denies any UTI symptoms.  He states that he has had little appetite and feels dehydrated today.  He denies any recent fevers or chills.  Exam today reveals slightly hypoactive bowel sounds; although bowel sounds audible.  Some slight guarding of generalized abdomen; with a firm mass to the upper central abdomen.  This area is more tender with palpation.  There is no flank pain.  Dr. Burr Medico in to examine patient as well.  Vital signs are stable and patient is afebrile.  Labs obtained today reveal that bilirubin has decreased from 20.4 down to 3.82.  Of note-patient underwent an ERCP with biliary stent placement on 05/26/2016.  He also underwent an abdominal biopsy of the peritoneal nodules this past Thursday, 06/01/2016.  The plan is for the patient to receive IV fluid rehydration while the cancer Center today.  If there is an opening this afternoon in the chemotherapy infusion room-patient will proceed with his chemotherapy today instead of tomorrow.  If there is no chemotherapy infusion availabilities today; patient will return to more for his first cycle of chemotherapy.  Will cancel the labs and a follow-up visit scheduled for 06/06/2016; since patient was seen and had labs today.  Patient was given morphine IV while at the [facility] per his request.  Also, will prescribe oxycodone for pain on a when necessary basis. Also, patient was advised to go  directly to the emergency department for any worsening symptoms whatsoever.

## 2016-06-05 NOTE — Assessment & Plan Note (Addendum)
Patient states he's had decreased appetite and decreased oral intake as well.  He feels mildly dehydrated today.  Patient will receive IV fluid rehydration today.  He was also encouraged to push fluids at home is much as possible.

## 2016-06-05 NOTE — Patient Instructions (Signed)
[facility] Discharge Instructions for Patients Receiving Chemotherapy  Today you received the following chemotherapy agents gemzar/abraxane  To help prevent nausea and vomiting after your treatment, we encourage you to take your nausea medication as directed  If you develop nausea and vomiting that is not controlled by your nausea medication, call the clinic.   BELOW ARE SYMPTOMS THAT SHOULD BE REPORTED IMMEDIATELY:  *FEVER GREATER THAN 100.5 F  *CHILLS WITH OR WITHOUT FEVER  NAUSEA AND VOMITING THAT IS NOT CONTROLLED WITH YOUR NAUSEA MEDICATION  *UNUSUAL SHORTNESS OF BREATH  *UNUSUAL BRUISING OR BLEEDING  TENDERNESS IN MOUTH AND THROAT WITH OR WITHOUT PRESENCE OF ULCERS  *URINARY PROBLEMS  *BOWEL PROBLEMS  UNUSUAL RASH Items with * indicate a potential emergency and should be followed up as soon as possible.  Feel free to call the clinic you have any questions or concerns. The clinic phone number is [phone].  

## 2016-06-06 ENCOUNTER — Telehealth: Payer: Self-pay | Admitting: Hematology

## 2016-06-06 ENCOUNTER — Other Ambulatory Visit: Payer: Medicare Other

## 2016-06-06 ENCOUNTER — Ambulatory Visit: Payer: Medicare Other | Admitting: Hematology

## 2016-06-06 ENCOUNTER — Ambulatory Visit: Payer: Medicare Other

## 2016-06-06 NOTE — Telephone Encounter (Signed)
sw pt to confirm 3/20 appt at 1015 am

## 2016-06-07 ENCOUNTER — Telehealth: Payer: Self-pay | Admitting: Internal Medicine

## 2016-06-07 NOTE — Telephone Encounter (Signed)
Spoke with pts wife to re-schedule appt. Pt is not feeling well after his chemo treatments and procedures.

## 2016-06-07 NOTE — Telephone Encounter (Signed)
Wife called regarding appt tomorrow requesting to speak to asst (will not say why).

## 2016-06-08 ENCOUNTER — Ambulatory Visit: Payer: Medicare Other | Admitting: Internal Medicine

## 2016-06-09 NOTE — Progress Notes (Signed)
[place]  [phone] [phone]  Clinic follow up Note   Patient Care Team: Binnie Rail, MD as PCP - General (Internal Medicine) [name], MD as Consulting Physician (General Surgery) 06/13/2016  CHIEF COMPLAINTS:  Follow-up pancreatic cancer  Oncology History   Primary pancreatic cancer [facility])   Staging form: Pancreas, AJCC 7th Edition     Clinical stage from 09/30/2015: Stage IIB (T2, N1, M0) - Signed by Truitt Merle, MD on 10/07/2015       Malignant neoplasm of body of pancreas ([place])   09/30/2015 Initial Diagnosis    Primary pancreatic cancer ([place])      09/30/2015 Initial Biopsy     endoscopic pancreatic body mass fine-needle aspiration show malignant cells consistent with adenocarcinoma.      09/30/2015 Procedure     EUS showe a 2.5 cm pancreatic mass causing a stream the dictation of the main pancreatic duct, and directly abutting the SMA an      10/06/2015 Imaging     CT chest, abdomen and  pelvis showed a  4 cm mass in the body of  pancreas, tumor abut the anterior wall of the SMA, a 1.4cm  he pedal duoligament lymph nodes is suspicious, 81m RUL lung nodule is nonspecific, no other distant mets      10/15/2015 -  Chemotherapy    neoadjuvant chemo gemcitabine '1000mg'$ /m2 and abraxane '120mg'$ /m2, on day 1, 8 every 21 days, held after 11/30/2015 due to radiation      01/12/2016 - 01/21/2016 Radiation Therapy    SBRT to pancreatic cancer, 33 Gy in 5 fractions        05/19/2016 Imaging    CT CAP w contrast Interval development of multiple soft tissue nodules within the anterior upper abdomen involving the right and left upper quadrants compatible with peritoneal metastatic disease. There is a small amount of new right perihepatic free fluid. Interval development of intrahepatic biliary ductal dilatation as well as dilation of the gallbladder. There is tapering of the central bile ducts at the level of soft tissue heterogeneity near the porta  hepatis which may represent a metastatic soft tissue mass or liver lesion. Slight interval increase in size of hypoenhancing pancreatic body mass with surrounding infiltrative tissue. Aortic atherosclerosis.  Sigmoid colonic diverticulosis.      05/25/2016 Procedure    ENDOSCOPIC RETROGRADE CHOLANGIOPANCREATOGRAPHY (ERCP) The patient underwent ERCP on 05/25/16 with finding of severe extrinsic malignant-looking common bile duct stricture. Dr. SFuller Planplaced stent across the stricture.      06/01/2016 Pathology Results    Diagnosis ABDOMEN, PERITONEAL NODULES, FINE NEEDLE ASPIRATION (SPECIMEN 1 OF 1 COLLECTED 06/01/16): POSITIVE FOR ADENOCARCINOMA, SEE COMMENT.       HISTORY OF PRESENTING ILLNESS:  [name] KNAAK81y.o. male is here because of His recently diagnosed pancreatic cancer. He presents to the clinic with his wife today.  He has been having abdominal pain for the past one month, worst in the past few weeks, radiates to back, 3-5/10, intermittent, worse with empty stomack, no nausea, bloating, BM is slightly constipated, once a daily. No melana or hematochezia. He has good appetie and eats well, mild fatigue, he is aboe to do his daily activities without difficulty, such as morning his long. He also exercise regularly, bikes a few miles a day. No significant weight loss recently.  He was seen by his primary care physician, and a CT chest and abdomen was done on 09/20/2015 which showed a 2.7 x 2.5 cm  mass in the pancreatic body with associated pancreatic duct dilatation. He was referred to see GI Dr. Ardis Hughs, and underwent EGD and EUS, backward a mass fine-needle biopsy, which showed adenocarcinoma.  He was diagnosed with prosttae can 5 years ago, has been monitored, no treatment, he has low PSA level.  He is diabetic, he is to be on metformin, has been off for a while because his sugar has been relatively controlled with diet alone. His recent HbA1c was some 7.9 in 05/2015.   CURRENT THERAPY:  Weekly gemcitabine and Abraxane, 2 weeks on,  one-week off, restarted on 06/06/2016  INTERIM HISTORY: [name] came in for follow up. He is doing well today. After chemo last week, he did well. His pain is moderate, but has drastically improved in the last day or so. Recently, he has had a problem swallowing pills; gagging and vomiting. So he hasn't been able to take his pain pills very easily. He has not been drinking much water or eating much. Denies nausea, or any other concerns.   MEDICAL HISTORY:  Past Medical History:  Diagnosis Date  . Arthritis    R hip and shoulder  . Complication of anesthesia    Local anesthetic used in office for Prostate biopsy" reaction Tachycardia,nausea, hallucinations, Blood pressure elevated"- No problems once done at hospital with anesthesia" Thinks Ciprofloxacin was the injection"   . Diabetes mellitus   . Enlarged prostate   . Femoral bruit    R femoral  . GERD with stricture    PMH of  . History of kidney stones   . Hyperlipidemia   . Hypertension   . Macular degeneration    glaucoma suspect  . Pancreatic cancer ([place]) 09/2015   adenocarcinoma of pancreas  . PONV (postoperative nausea and vomiting)   . Prostate cancer [facility])    prostate ; Dr Wyvonnia Lora to be monitored x 5 yrs now.  . Squamous papilloma    of esophagus    SURGICAL HISTORY: Past Surgical History:  Procedure Laterality Date  . ANKLE FUSION  2004  . COLONOSCOPY  05/2012  . ERCP N/A 05/25/2016   Procedure: ENDOSCOPIC RETROGRADE CHOLANGIOPANCREATOGRAPHY (ERCP);  Surgeon: Ladene Artist, MD;  Location: [facility] ENDOSCOPY;  Service: Endoscopy;  Laterality: N/A;  . esophageal dilation  2002   Dr  Lucio Edward  . ESOPHAGOGASTRODUODENOSCOPY (EGD) WITH PROPOFOL N/A 09/30/2015   Procedure: ESOPHAGOGASTRODUODENOSCOPY (EGD) WITH PROPOFOL;  Surgeon: Milus Banister, MD;  Location: [facility] ENDOSCOPY;  Service: Endoscopy;  Laterality: N/A;  . EUS N/A 09/30/2015   Procedure: UPPER ENDOSCOPIC  ULTRASOUND (EUS) RADIAL;  Surgeon: Milus Banister, MD;  Location: [facility] ENDOSCOPY;  Service: Endoscopy;  Laterality: N/A;  . FINE NEEDLE ASPIRATION N/A 09/30/2015   Procedure: FINE NEEDLE ASPIRATION (FNA) LINEAR;  Surgeon: Milus Banister, MD;  Location: [facility] ENDOSCOPY;  Service: Endoscopy;  Laterality: N/A;  . HERNIA REPAIR     bil inguinal hernias  . PARTIAL COLECTOMY     perforated diverticulitis  . PORTACATH PLACEMENT Left 10/13/2015   Procedure: INSERTION PORT-A-CATH;  Surgeon: [name], MD;  Location: [facility] ORS;  Service: General;  Laterality: Left;  . PROSTATE BIOPSY  03/29/2012   Procedure: BIOPSY TRANSRECTAL ULTRASONIC PROSTATE (TUBP);  Surgeon: Fredricka Bonine, MD;  [facility];  Service: Urology;  Laterality: N/A;  . ROTATOR CUFF REPAIR  2006  . TOTAL HIP ARTHROPLASTY Left 2012    SOCIAL HISTORY: Social History   Social History  . Marital status: Married  Spouse name: Deloris  . Number of children: 1  . Years of education: N/A   Occupational History  . Retired     Ecologist in [place]  . Smoking status: Former Smoker    Packs/day: 0.50    Years: 40.00    Types: Cigarettes    Quit date: 03/27/1989  . Smokeless tobacco: Never Used     Comment: smoked [place], up to 1 ppd  . Alcohol use Yes     Comment: social  . Drug use: No  . Sexual activity: Not on file   Other Topics Concern  . 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 He and his wife moved to [place] to be closer to his son.  He has one son and one Granddaughter who is 28.  FAMILY HISTORY: Family History  Problem Relation Age of Onset  . Kidney disease Father     ? Rheumatic fever as  child  . Hypertension Father   . Heart attack Paternal Uncle     in 86s  . COPD Paternal Uncle     due to exposures in [facility]  . Kidney disease Paternal Aunt     ? Rheumatic fever as child  . Cancer Neg Hx   . Diabetes Neg Hx   . Stroke Neg Hx   . Colon cancer Neg Hx     ALLERGIES:  is allergic to ace inhibitors; angiotensin receptor blockers; and ciprofloxacin.  MEDICATIONS:  Current Outpatient Prescriptions  Medication Sig Dispense Refill  . amLODipine (NORVASC) 10 MG tablet Take 1 tablet (10 mg total) by mouth daily. 90 tablet 3  . aspirin 81 MG tablet Take 1 tablet (81 mg total) by mouth daily. 30 tablet   . doxazosin (CARDURA) 1 MG tablet TAKE 1 TABLET BY MOUTH EVERY DAY 90 tablet 2  . hydrochlorothiazide (MICROZIDE) 12.5 MG capsule Take 1 capsule (12.5 mg total) by mouth daily. 90 capsule 3  . latanoprost (XALATAN) 0.005 % ophthalmic solution Place 1 drop into both eyes at bedtime.  1  . lidocaine-prilocaine (EMLA) cream Apply 1 application topically as needed. Apply to pac site 1 hour prior to stick and cover with plastic wrap (Patient taking differently: Apply 1 application topically See admin instructions. Apply to pac site 1 hour prior to stick and cover with plastic wrap - approximately once a month) 30 g 11  . metFORMIN (GLUCOPHAGE) 500 MG tablet Take 1 tablet (500 mg total) by mouth 2 (two) times daily with a meal. 180 tablet 3  . naproxen sodium (ALEVE) 220 MG tablet Take 220 mg by mouth daily as needed (pain/headache).    Glory Rosebush DELICA LANCETS 83T MISC Use to check blood sugars once a day Dx E11.9 100 each 3  . ONETOUCH VERIO test strip CHECK BLOOD SUGAR ONCE DAILY. DX250.00 100 each 5  . OVER THE COUNTER MEDICATION Place 1 drop into both eyes daily. Over the counter lubricating eye drops    . oxyCODONE (OXY IR/ROXICODONE) 5 MG immediate release tablet 1-2 tabs PO Q 6 hours PRN pain 45 tablet 0  . potassium chloride 20 MEQ/15ML (10%) SOLN Take  15 mLs (20 mEq total) by  mouth daily. 450 mL 0  . pravastatin (PRAVACHOL) 20 MG tablet Take 1 tablet (20 mg total) by mouth at bedtime. 90 tablet 3  . traMADol (ULTRAM) 50 MG tablet Take 0.5 tablets (25 mg total) by mouth every 6 (six) hours as needed. (Patient taking differently: Take 25 mg by mouth every 6 (six) hours as needed (pain). ) 30 tablet 0  . Calcium Carbonate-Vitamin D (CALCIUM-D PO) Take 1 tablet by mouth daily.    Marland Kitchen HYDROcodone-acetaminophen (NORCO) 5-325 MG tablet Take 1 tablet by mouth every 6 (six) hours as needed for moderate pain. (Patient not taking: Reported on 06/05/2016) 12 tablet 0  . multivitamin-lutein (OCUVITE-LUTEIN) CAPS Take 1 capsule by mouth 2 (two) times daily.      No current facility-administered medications for this visit.     REVIEW OF SYSTEMS:   Constitutional: Denies fevers, chills or abnormal night sweats (+) loss of appetite  Eyes: Denies blurriness of vision, double vision or watery eyes Ears, nose, mouth, throat, and face: Denies mucositis or sore throat Respiratory: Denies cough, dyspnea or wheezes Cardiovascular: Denies palpitation, chest discomfort or lower extremity swelling Gastrointestinal:  Denies nausea, heartburn or change in bowel habits (+) abdominal discomfort (+) gagging, vomiting pills  Skin: Denies abnormal skin rashes Lymphatics: Denies new lymphadenopathy or easy bruising Neurological:Denies numbness, tingling or new weaknesses Behavioral/Psych: Mood is stable, no new changes  All other systems were reviewed with the patient and are negative.  PHYSICAL EXAMINATION: ECOG PERFORMANCE STATUS: 1  BP 123/69 (BP Location: Right Arm, Patient Position: Sitting)   Pulse 96   Temp 97.6 F (36.4 C) (Oral)   Resp 18   Ht '5\' 10"'$  (1.778 m)   Wt 120 lb 9.6 oz (54.7 kg)   SpO2 98%   BMI 17.30 kg/m    GENERAL:alert, no distress and comfortable SKIN: skin texture, turgor are normal, no rashes or significant lesions (+) jaundice  EYES: normal, conjunctiva are  pink and non-injected, sclera clear OROPHARYNX:no exudate, no erythema and lips, buccal mucosa, and tongue normal  NECK: supple, thyroid normal size, non-tender, without nodularity LYMPH:  no palpable lymphadenopathy in the cervical, axillary or inguinal LUNGS: clear to auscultation and percussion with normal breathing effort HEART: regular rate & rhythm and no murmurs and no lower extremity edema ABDOMEN:abdomen soft, non-tender and normal bowel sounds Musculoskeletal:no cyanosis of digits and no clubbing  PSYCH: alert & oriented x 3 with fluent speech NEURO: no focal motor/sensory deficits EXT: no edema   LABORATORY DATA:  I have reviewed the data as listed CBC Latest Ref Rng & Units 06/13/2016 06/05/2016 06/01/2016  WBC 4.0 - 10.3 10e3/uL 7.4 11.0(H) 8.5  Hemoglobin 13.0 - 17.1 g/dL 10.6(L) 11.5(L) 11.1(L)  Hematocrit 38.4 - 49.9 % 31.7(L) 34.8(L) 33.3(L)  Platelets 140 - 400 10e3/uL 256 287 308   CMP Latest Ref Rng & Units 06/13/2016 06/13/2016 06/05/2016  Glucose 70 - 140 mg/dl 235(H) - 144(H)  BUN 7.0 - 26.0 mg/dL 17.5 - 12.9  Creatinine 0.7 - 1.3 mg/dL 1.0 - 1.0  Sodium 136 - 145 mEq/L 138 - 137  Potassium 3.5 - 5.1 mEq/L 3.9 - 3.9  Chloride 101 - 111 mmol/L - - -  CO2 22 - 29 mEq/L 26 - 27  Calcium 8.4 - 10.4 mg/dL 9.1 - 9.5  Total Protein 6.4 - 8.3 g/dL 6.8 6.7 7.2  Total Bilirubin 0.20 - 1.20 mg/dL 2.62(H) 2.7(H) 3.82(HH)  Alkaline Phos 40 - 150 U/L 165(H)  148(H) 227(H)  AST 5 - 34 U/L 45(H) 41(H) 35(H)  ALT 0 - 55 U/L 64(H) 55(H) 68(H)   CA19.9 (0-35U/ml) 09/30/2015: 370 10/07/2015: 516 10/29/2015: 381 11/26/2015: 84 01/27/2016: 13 02/25/2016: 22 04/25/2016: 314 05/23/2016: 1,456  PATHOLOGY REPORT  Diagnosis 06/01/2016 ABDOMEN, PERITONEAL NODULES, FINE NEEDLE ASPIRATION (SPECIMEN 1 OF 1 COLLECTED 06/01/16): POSITIVE FOR ADENOCARCINOMA, SEE COMMENT. Preliminary Diagnosis Intraoperative Diagnosis: ADEQUATE. RECOMMEND ADDITIONAL FOR CELL BLOCK FOR ANCILLARY  STUDIES.(JM)  Diagnosis 09/30/2015 FINE NEEDLE ASPIRATION, ENDOSCOPIC PANCREATIC BODY (SPECIMEN 1 OF 1 COLLECTED 09/30/2015) MALIGNANT CELLS PRESENT, CONSISTENT WITH ADENOCARCINOMA. SEE COMMENT. COMMENT: THE CASE WAS DISCUSSED WITH DR. Ardis Hughs ON 10/01/2015.  RADIOGRAPHIC STUDIES: I have personally reviewed the radiological images as listed and agreed with the findings in the report.  DG ERCP 05/25/2016 IMPRESSION: High-grade stricture of the common hepatic duct just below the confluence of right and left intrahepatic bile ducts. An endoscopic biliary stent was placed extending across the stricture.  ASSESSMENT & PLAN: 81 y.o. African-American male, presented with epigastric pain for a month.  1. Primary pancreatic cancer, adenocarcinoma in the body of pancreas,cT2N1M0, stage IIB, probable unresectable, peritoneal metastasis in 04/2016 -I previously reviewed his CT scan findings, EUS findings and pancreatic mass biopsy pathology results with pt and his wife in details. -His initial CT scan showed no evidence of distant metastasis, there is a 14 mm hepatoduodenal node on CT which is suspicious for nodal metastasis, EUS was negative for peripancreatic adenopathy.  -he has completed 2 cycles of Abraxane and gemcitabine, followed by radiation, tolerated very well. -unfortunately his CT scan from 05/19/2016 showed multiple peritoneal masses, his CA19.9 has significantly increased, he also developed abdominal discomfort, this is highly suspicious for metastasis -I recommend him to have needle biopsy of his peritoneal mass and get tissue for genomic testing such as MSI.  -due to his high Tbili from obstruction, I will refer him back to GI for ERCP and stenting  -He has restart chemo gemcitabine and Abraxane with dose reduction due to his abnormal LFTs -the goal of therapy is palliative -He has had difficulty swallowing any pills. He will talk to the pharmacy to see if they can give it to him in liquid  form or crush the pill.  -He tolerated first week chemotherapy well last week, overall feels better this week, lab reviewed, we'll continue with 2 chemotherapy today, of chemotherapy next week. -I have requested MSI test on the biopsy sample to see if he is a candidate for immunotherapy.   2. Obstructive jaundice  -secondary to pancreatic mass -Previous CT scan showed no liver mets  -s/p stent placement on 05/25/2016,  jaundice has much improved.  3. Abdominal discomfort  -I gave him a prescription of tramadol to use as need, he is not taking much - I recommend him to avoid tylenol and NSAIDs for now   4. DM -He takes Metformin twice daily and checks his sugar once daily -The discussed that steroids can cause hypoglycemia, and he needs to check his sugar more frequently at home  -He will continue follow-up with his primary care physician  5. Prostate cancer, untreated  -His prostate cancer has been monitored by his urologist, treatment was not recommended -will follow up clinically    6. HTN -Continue medication, follow up with PCP -I told him to hold HCTZ for now, he will continue amlodipine and doxazosin   5. Abdominal Discomfort -I have prescribed Tramadol.   7. Anorexia and Weight Loss -Lost 10 lbs in the last  month.  -He had diarrhea with Ensure.  -I have recommended that he try Boost or other nutrient supplements -follow up with dietitian   Plan -Continue gemcitabine and Abraxane today. 2 weeks on, 1 week off. Off next week.  -IV fluids early next week  -Lab and follow-up in 2 weeks with chemotherapy     All questions were answered. The patient knows to call the clinic with any problems, questions or concerns.  I spent 25 minutes counseling the patient face to face. The total time spent in the appointment was 30 minutes and more than 50% was on counseling.  This document serves as a record of services personally performed by Truitt Merle, MD. It was created on her behalf  by Martinique Casey, a trained medical scribe. The creation of this record is based on the scribe's personal observations and the provider's statements to them. This document has been checked and approved by the attending provider.  I have reviewed the above documentation for accuracy and completeness and I agree with the above.   Truitt Merle, MD 06/13/2016

## 2016-06-13 ENCOUNTER — Other Ambulatory Visit: Payer: Self-pay

## 2016-06-13 ENCOUNTER — Encounter: Payer: Self-pay | Admitting: Hematology

## 2016-06-13 ENCOUNTER — Ambulatory Visit (HOSPITAL_BASED_OUTPATIENT_CLINIC_OR_DEPARTMENT_OTHER): Payer: Medicare Other

## 2016-06-13 ENCOUNTER — Ambulatory Visit (HOSPITAL_BASED_OUTPATIENT_CLINIC_OR_DEPARTMENT_OTHER): Payer: Medicare Other | Admitting: Hematology

## 2016-06-13 ENCOUNTER — Other Ambulatory Visit (HOSPITAL_BASED_OUTPATIENT_CLINIC_OR_DEPARTMENT_OTHER): Payer: Medicare Other

## 2016-06-13 ENCOUNTER — Other Ambulatory Visit (INDEPENDENT_AMBULATORY_CARE_PROVIDER_SITE_OTHER): Payer: Medicare Other

## 2016-06-13 VITALS — BP 123/69 | HR 96 | Temp 97.6°F | Resp 18 | Ht 70.0 in | Wt 120.6 lb

## 2016-06-13 DIAGNOSIS — C61 Malignant neoplasm of prostate: Secondary | ICD-10-CM | POA: Diagnosis not present

## 2016-06-13 DIAGNOSIS — I10 Essential (primary) hypertension: Secondary | ICD-10-CM

## 2016-06-13 DIAGNOSIS — R7989 Other specified abnormal findings of blood chemistry: Secondary | ICD-10-CM

## 2016-06-13 DIAGNOSIS — E119 Type 2 diabetes mellitus without complications: Secondary | ICD-10-CM

## 2016-06-13 DIAGNOSIS — Z5111 Encounter for antineoplastic chemotherapy: Secondary | ICD-10-CM

## 2016-06-13 DIAGNOSIS — C251 Malignant neoplasm of body of pancreas: Secondary | ICD-10-CM

## 2016-06-13 DIAGNOSIS — R945 Abnormal results of liver function studies: Principal | ICD-10-CM

## 2016-06-13 LAB — COMPREHENSIVE METABOLIC PANEL
ALT: 64 U/L — AB (ref 0–55)
AST: 45 U/L — AB (ref 5–34)
Albumin: 2.6 g/dL — ABNORMAL LOW (ref 3.5–5.0)
Alkaline Phosphatase: 165 U/L — ABNORMAL HIGH (ref 40–150)
Anion Gap: 13 mEq/L — ABNORMAL HIGH (ref 3–11)
BUN: 17.5 mg/dL (ref 7.0–26.0)
CALCIUM: 9.1 mg/dL (ref 8.4–10.4)
CHLORIDE: 100 meq/L (ref 98–109)
CO2: 26 meq/L (ref 22–29)
Creatinine: 1 mg/dL (ref 0.7–1.3)
EGFR: 80 mL/min/{1.73_m2} — ABNORMAL LOW (ref >90)
Glucose: 235 mg/dl — ABNORMAL HIGH (ref 70–140)
POTASSIUM: 3.9 meq/L (ref 3.5–5.1)
Sodium: 138 mEq/L (ref 136–145)
Total Bilirubin: 2.62 mg/dL — ABNORMAL HIGH (ref 0.20–1.20)
Total Protein: 6.8 g/dL (ref 6.4–8.3)

## 2016-06-13 LAB — CBC WITH DIFFERENTIAL/PLATELET
BASO%: 0.4 % (ref 0.0–2.0)
BASOS ABS: 0 10*3/uL (ref 0.0–0.1)
EOS%: 0.2 % (ref 0.0–7.0)
Eosinophils Absolute: 0 10*3/uL (ref 0.0–0.5)
HEMATOCRIT: 31.7 % — AB (ref 38.4–49.9)
HGB: 10.6 g/dL — ABNORMAL LOW (ref 13.0–17.1)
LYMPH#: 0.6 10*3/uL — AB (ref 0.9–3.3)
LYMPH%: 8.6 % — AB (ref 14.0–49.0)
MCH: 27.4 pg (ref 27.2–33.4)
MCHC: 33.4 g/dL (ref 32.0–36.0)
MCV: 81.8 fL (ref 79.3–98.0)
MONO#: 0.7 10*3/uL (ref 0.1–0.9)
MONO%: 9.9 % (ref 0.0–14.0)
NEUT#: 6 10*3/uL (ref 1.5–6.5)
NEUT%: 80.9 % — AB (ref 39.0–75.0)
Platelets: 256 10*3/uL (ref 140–400)
RBC: 3.87 10*6/uL — ABNORMAL LOW (ref 4.20–5.82)
RDW: 15.8 % — ABNORMAL HIGH (ref 11.0–14.6)
WBC: 7.4 10*3/uL (ref 4.0–10.3)

## 2016-06-13 LAB — HEPATIC FUNCTION PANEL
ALBUMIN: 3.3 g/dL — AB (ref 3.5–5.2)
ALK PHOS: 148 U/L — AB (ref 39–117)
ALT: 55 U/L — AB (ref 0–53)
AST: 41 U/L — ABNORMAL HIGH (ref 0–37)
BILIRUBIN DIRECT: 1.3 mg/dL — AB (ref 0.0–0.3)
TOTAL PROTEIN: 6.7 g/dL (ref 6.0–8.3)
Total Bilirubin: 2.7 mg/dL — ABNORMAL HIGH (ref 0.2–1.2)

## 2016-06-13 MED ORDER — SODIUM CHLORIDE 0.9 % IV SOLN
800.0000 mg/m2 | Freq: Once | INTRAVENOUS | Status: AC
  Administered 2016-06-13: 1368 mg via INTRAVENOUS
  Filled 2016-06-13: qty 35.98

## 2016-06-13 MED ORDER — SODIUM CHLORIDE 0.9% FLUSH
10.0000 mL | INTRAVENOUS | Status: DC | PRN
Start: 2016-06-13 — End: 2016-06-13
  Administered 2016-06-13: 10 mL
  Filled 2016-06-13: qty 10

## 2016-06-13 MED ORDER — PROCHLORPERAZINE MALEATE 10 MG PO TABS
ORAL_TABLET | ORAL | Status: AC
  Filled 2016-06-13: qty 1

## 2016-06-13 MED ORDER — HEPARIN SOD (PORK) LOCK FLUSH 100 UNIT/ML IV SOLN
500.0000 [IU] | Freq: Once | INTRAVENOUS | Status: AC | PRN
  Administered 2016-06-13: 500 [IU]
  Filled 2016-06-13: qty 5

## 2016-06-13 MED ORDER — SODIUM CHLORIDE 0.9 % IV SOLN
Freq: Once | INTRAVENOUS | Status: AC
  Administered 2016-06-13: 13:00:00 via INTRAVENOUS

## 2016-06-13 MED ORDER — PROCHLORPERAZINE MALEATE 10 MG PO TABS
10.0000 mg | ORAL_TABLET | Freq: Once | ORAL | Status: AC
  Administered 2016-06-13: 10 mg via ORAL

## 2016-06-13 MED ORDER — PACLITAXEL PROTEIN-BOUND CHEMO INJECTION 100 MG
80.0000 mg/m2 | Freq: Once | INTRAVENOUS | Status: AC
  Administered 2016-06-13: 150 mg via INTRAVENOUS
  Filled 2016-06-13: qty 30

## 2016-06-13 NOTE — Progress Notes (Signed)
Dr. Burr Medico aware of CBC/CMP results. Advised ok to treat.

## 2016-06-13 NOTE — Progress Notes (Signed)
lft

## 2016-06-13 NOTE — Patient Instructions (Signed)
[facility] Discharge Instructions for Patients Receiving Chemotherapy  Today you received the following chemotherapy agents: Abraxane and Gemzar   To help prevent nausea and vomiting after your treatment, we encourage you to take your nausea medication as directed.    If you develop nausea and vomiting that is not controlled by your nausea medication, call the clinic.   BELOW ARE SYMPTOMS THAT SHOULD BE REPORTED IMMEDIATELY:  *FEVER GREATER THAN 100.5 F  *CHILLS WITH OR WITHOUT FEVER  NAUSEA AND VOMITING THAT IS NOT CONTROLLED WITH YOUR NAUSEA MEDICATION  *UNUSUAL SHORTNESS OF BREATH  *UNUSUAL BRUISING OR BLEEDING  TENDERNESS IN MOUTH AND THROAT WITH OR WITHOUT PRESENCE OF ULCERS  *URINARY PROBLEMS  *BOWEL PROBLEMS  UNUSUAL RASH Items with * indicate a potential emergency and should be followed up as soon as possible.  Feel free to call the clinic you have any questions or concerns. The clinic phone number is [phone].  Please show the CHEMO ALERT CARD at check-in to the Emergency Department and triage nurse.   

## 2016-06-14 ENCOUNTER — Encounter (HOSPITAL_COMMUNITY): Payer: Self-pay | Admitting: Emergency Medicine

## 2016-06-14 ENCOUNTER — Other Ambulatory Visit (HOSPITAL_COMMUNITY): Payer: Self-pay

## 2016-06-14 ENCOUNTER — Telehealth: Payer: Self-pay | Admitting: *Deleted

## 2016-06-14 ENCOUNTER — Other Ambulatory Visit: Payer: Self-pay

## 2016-06-14 ENCOUNTER — Emergency Department (HOSPITAL_COMMUNITY): Payer: Medicare Other

## 2016-06-14 ENCOUNTER — Inpatient Hospital Stay (HOSPITAL_COMMUNITY)
Admission: EM | Admit: 2016-06-14 | Discharge: 2016-06-25 | DRG: 871 | Disposition: E | Payer: Medicare Other | Attending: Family Medicine | Admitting: Family Medicine

## 2016-06-14 DIAGNOSIS — Z8249 Family history of ischemic heart disease and other diseases of the circulatory system: Secondary | ICD-10-CM

## 2016-06-14 DIAGNOSIS — Z7984 Long term (current) use of oral hypoglycemic drugs: Secondary | ICD-10-CM

## 2016-06-14 DIAGNOSIS — Z96642 Presence of left artificial hip joint: Secondary | ICD-10-CM | POA: Diagnosis present

## 2016-06-14 DIAGNOSIS — J181 Lobar pneumonia, unspecified organism: Secondary | ICD-10-CM

## 2016-06-14 DIAGNOSIS — A419 Sepsis, unspecified organism: Secondary | ICD-10-CM

## 2016-06-14 DIAGNOSIS — J189 Pneumonia, unspecified organism: Secondary | ICD-10-CM | POA: Diagnosis present

## 2016-06-14 DIAGNOSIS — C251 Malignant neoplasm of body of pancreas: Secondary | ICD-10-CM | POA: Diagnosis present

## 2016-06-14 DIAGNOSIS — Z87891 Personal history of nicotine dependence: Secondary | ICD-10-CM

## 2016-06-14 DIAGNOSIS — Z7951 Long term (current) use of inhaled steroids: Secondary | ICD-10-CM

## 2016-06-14 DIAGNOSIS — D696 Thrombocytopenia, unspecified: Secondary | ICD-10-CM | POA: Diagnosis present

## 2016-06-14 DIAGNOSIS — N179 Acute kidney failure, unspecified: Secondary | ICD-10-CM | POA: Diagnosis not present

## 2016-06-14 DIAGNOSIS — R031 Nonspecific low blood-pressure reading: Secondary | ICD-10-CM | POA: Diagnosis not present

## 2016-06-14 DIAGNOSIS — J9601 Acute respiratory failure with hypoxia: Secondary | ICD-10-CM | POA: Diagnosis present

## 2016-06-14 DIAGNOSIS — H3530 Unspecified macular degeneration: Secondary | ICD-10-CM | POA: Diagnosis present

## 2016-06-14 DIAGNOSIS — I10 Essential (primary) hypertension: Secondary | ICD-10-CM | POA: Diagnosis present

## 2016-06-14 DIAGNOSIS — C786 Secondary malignant neoplasm of retroperitoneum and peritoneum: Secondary | ICD-10-CM | POA: Diagnosis present

## 2016-06-14 DIAGNOSIS — R0602 Shortness of breath: Secondary | ICD-10-CM | POA: Diagnosis not present

## 2016-06-14 DIAGNOSIS — Z681 Body mass index (BMI) 19 or less, adult: Secondary | ICD-10-CM

## 2016-06-14 DIAGNOSIS — K819 Cholecystitis, unspecified: Secondary | ICD-10-CM

## 2016-06-14 DIAGNOSIS — K810 Acute cholecystitis: Secondary | ICD-10-CM | POA: Diagnosis present

## 2016-06-14 DIAGNOSIS — Z825 Family history of asthma and other chronic lower respiratory diseases: Secondary | ICD-10-CM

## 2016-06-14 DIAGNOSIS — R531 Weakness: Secondary | ICD-10-CM | POA: Diagnosis not present

## 2016-06-14 DIAGNOSIS — F419 Anxiety disorder, unspecified: Secondary | ICD-10-CM | POA: Diagnosis present

## 2016-06-14 DIAGNOSIS — R509 Fever, unspecified: Secondary | ICD-10-CM | POA: Diagnosis not present

## 2016-06-14 DIAGNOSIS — Z9221 Personal history of antineoplastic chemotherapy: Secondary | ICD-10-CM

## 2016-06-14 DIAGNOSIS — E785 Hyperlipidemia, unspecified: Secondary | ICD-10-CM | POA: Diagnosis present

## 2016-06-14 DIAGNOSIS — Z515 Encounter for palliative care: Secondary | ICD-10-CM | POA: Diagnosis present

## 2016-06-14 DIAGNOSIS — R109 Unspecified abdominal pain: Secondary | ICD-10-CM | POA: Diagnosis not present

## 2016-06-14 DIAGNOSIS — Z7982 Long term (current) use of aspirin: Secondary | ICD-10-CM

## 2016-06-14 DIAGNOSIS — J811 Chronic pulmonary edema: Secondary | ICD-10-CM | POA: Diagnosis not present

## 2016-06-14 DIAGNOSIS — D649 Anemia, unspecified: Secondary | ICD-10-CM | POA: Diagnosis present

## 2016-06-14 DIAGNOSIS — Z841 Family history of disorders of kidney and ureter: Secondary | ICD-10-CM

## 2016-06-14 DIAGNOSIS — Z66 Do not resuscitate: Secondary | ICD-10-CM | POA: Diagnosis present

## 2016-06-14 DIAGNOSIS — E119 Type 2 diabetes mellitus without complications: Secondary | ICD-10-CM | POA: Diagnosis present

## 2016-06-14 DIAGNOSIS — R079 Chest pain, unspecified: Secondary | ICD-10-CM | POA: Diagnosis not present

## 2016-06-14 DIAGNOSIS — E860 Dehydration: Secondary | ICD-10-CM | POA: Diagnosis present

## 2016-06-14 DIAGNOSIS — E46 Unspecified protein-calorie malnutrition: Secondary | ICD-10-CM | POA: Diagnosis not present

## 2016-06-14 DIAGNOSIS — A4151 Sepsis due to Escherichia coli [E. coli]: Principal | ICD-10-CM | POA: Diagnosis present

## 2016-06-14 DIAGNOSIS — R7989 Other specified abnormal findings of blood chemistry: Secondary | ICD-10-CM

## 2016-06-14 DIAGNOSIS — K219 Gastro-esophageal reflux disease without esophagitis: Secondary | ICD-10-CM | POA: Diagnosis present

## 2016-06-14 DIAGNOSIS — R740 Nonspecific elevation of levels of transaminase and lactic acid dehydrogenase [LDH]: Secondary | ICD-10-CM | POA: Diagnosis not present

## 2016-06-14 LAB — CBC WITH DIFFERENTIAL/PLATELET
BASOS ABS: 0 10*3/uL (ref 0.0–0.1)
Basophils Relative: 0 %
EOS ABS: 0.1 10*3/uL (ref 0.0–0.7)
Eosinophils Relative: 1 %
HEMATOCRIT: 31.4 % — AB (ref 39.0–52.0)
Hemoglobin: 10.2 g/dL — ABNORMAL LOW (ref 13.0–17.0)
LYMPHS ABS: 0.1 10*3/uL — AB (ref 0.7–4.0)
Lymphocytes Relative: 1 %
MCH: 26.4 pg (ref 26.0–34.0)
MCHC: 32.5 g/dL (ref 30.0–36.0)
MCV: 81.1 fL (ref 78.0–100.0)
MONO ABS: 0.1 10*3/uL (ref 0.1–1.0)
MONOS PCT: 1 %
Neutro Abs: 12.6 10*3/uL — ABNORMAL HIGH (ref 1.7–7.7)
Neutrophils Relative %: 97 %
PLATELETS: 116 10*3/uL — AB (ref 150–400)
RBC: 3.87 MIL/uL — ABNORMAL LOW (ref 4.22–5.81)
RDW: 16.3 % — AB (ref 11.5–15.5)
WBC: 12.9 10*3/uL — AB (ref 4.0–10.5)

## 2016-06-14 LAB — COMPREHENSIVE METABOLIC PANEL
ANION GAP: 19 — AB (ref 5–15)
AST: <5 U/L — ABNORMAL LOW (ref 15–41)
Albumin: 2.2 g/dL — ABNORMAL LOW (ref 3.5–5.0)
Alkaline Phosphatase: 237 U/L — ABNORMAL HIGH (ref 38–126)
BUN: 21 mg/dL — ABNORMAL HIGH (ref 6–20)
CALCIUM: 7.5 mg/dL — AB (ref 8.9–10.3)
CO2: 15 mmol/L — AB (ref 22–32)
CREATININE: 1.32 mg/dL — AB (ref 0.61–1.24)
Chloride: 103 mmol/L (ref 101–111)
GFR, EST AFRICAN AMERICAN: 58 mL/min — AB (ref >60)
GFR, EST NON AFRICAN AMERICAN: 50 mL/min — AB (ref >60)
Glucose, Bld: 171 mg/dL — ABNORMAL HIGH (ref 65–99)
Potassium: 4.2 mmol/L (ref 3.5–5.1)
SODIUM: 137 mmol/L (ref 135–145)
Total Bilirubin: 4.6 mg/dL — ABNORMAL HIGH (ref 0.3–1.2)
Total Protein: 5.4 g/dL — ABNORMAL LOW (ref 6.5–8.1)

## 2016-06-14 LAB — PROTIME-INR
INR: 1.53
PROTHROMBIN TIME: 18.5 s — AB (ref 11.4–15.2)

## 2016-06-14 LAB — I-STAT TROPONIN, ED: TROPONIN I, POC: 0.01 ng/mL (ref 0.00–0.08)

## 2016-06-14 LAB — I-STAT CG4 LACTIC ACID, ED: LACTIC ACID, VENOUS: 10.09 mmol/L — AB (ref 0.5–1.9)

## 2016-06-14 LAB — LIPASE, BLOOD: Lipase: <10 U/L — ABNORMAL LOW (ref 11–51)

## 2016-06-14 MED ORDER — SODIUM CHLORIDE 0.9 % IV BOLUS (SEPSIS)
1000.0000 mL | Freq: Once | INTRAVENOUS | Status: AC
  Administered 2016-06-14: 1000 mL via INTRAVENOUS

## 2016-06-14 MED ORDER — PIPERACILLIN-TAZOBACTAM 3.375 G IVPB 30 MIN
3.3750 g | Freq: Once | INTRAVENOUS | Status: AC
  Administered 2016-06-14: 3.375 g via INTRAVENOUS
  Filled 2016-06-14: qty 50

## 2016-06-14 MED ORDER — VANCOMYCIN HCL IN DEXTROSE 1-5 GM/200ML-% IV SOLN
1000.0000 mg | Freq: Once | INTRAVENOUS | Status: AC
  Administered 2016-06-14: 1000 mg via INTRAVENOUS
  Filled 2016-06-14: qty 200

## 2016-06-14 MED ORDER — IOPAMIDOL (ISOVUE-370) INJECTION 76%
INTRAVENOUS | Status: AC
  Administered 2016-06-14: 100 mL
  Filled 2016-06-14: qty 100

## 2016-06-14 MED ORDER — BIOTENE DRY MOUTH MT LIQD: 15.0000 mL | OROMUCOSAL | Status: DC | PRN

## 2016-06-14 NOTE — ED Notes (Signed)
Spoke w/ lab to add on Lipase to previous blood draw. They stated that would be ok.

## 2016-06-14 NOTE — ED Notes (Signed)
NRB turned to 10L/min. Pt O2 sat @ 99%.

## 2016-06-14 NOTE — ED Notes (Addendum)
Pt c/o increased SHOB w/ chest tightness. Bumped [place] O2 up to 6L/min due to pt sats being 85%. Sats did not increase after bumped up O2, put pt on NRB @ 15L/min.

## 2016-06-14 NOTE — ED Notes (Signed)
Elevated CG-4 reported to [place]-RN who stated that she would let Dr. Viviana Simpler know.

## 2016-06-14 NOTE — ED Triage Notes (Signed)
Pt from home via [facility]. Pt has hx of pancreatic cancer and had second dose of chemo yesterday. C/o generalized weakness, SHOB, chest tightness & dry mouth since this morning. Denies n/v/d. PTA EMS gave pt 750 NS & 324 ASA for chest tightness. Pt rates pain @ 3/10 in abd. A&O x4 on arrival.

## 2016-06-14 NOTE — ED Notes (Addendum)
NRB removed and [place] bumped down to 5L/min, pt O2 sat @ 99%.

## 2016-06-14 NOTE — ED Notes (Signed)
Pt went back down to 88% O2 sat on 5L/min [place]. Put back on NRB @ 15L/min.

## 2016-06-14 NOTE — ED Provider Notes (Signed)
[facility] DEPT Provider Note   CSN: [id number] Arrival date & time: 06/05/2016  1909     History   Chief Complaint Chief Complaint  Patient presents with  . Shortness of Breath  . Hypotension  . Weakness    HPI [name] is a 81 y.o. male.  HPI 81 year old male who presents with shortness of breath and generalized weakness. He has a history of primary pancreatic cancer with potential metastasis on palliative chemotherapy. Also with history of hypertension, hyperlipidemia and diabetes. States that he had second round of chemotherapy yesterday, and felt well and in his usual state of health. Today, has had worsening generalized weakness. Has been drinking a lot of fluids but having diminishing and dark urine. Having some increased right-sided abdominal pain as well with diarrhea but no vomiting. Reports the evening started having some chest tightness and some increasing shortness of breath with no significant cough or sputum production. His wife noted that he did have fever of 100.6 earlier today. He can Imodium for diarrhea to good effect. No dysuria or urinary frequency. No leg swelling or leg pains.   Past Medical History:  Diagnosis Date  . Arthritis    R hip and shoulder  . Complication of anesthesia    Local anesthetic used in office for Prostate biopsy" reaction Tachycardia,nausea, hallucinations, Blood pressure elevated"- No problems once done at hospital with anesthesia" Thinks Ciprofloxacin was the injection"   . Diabetes mellitus   . Enlarged prostate   . Femoral bruit    R femoral  . GERD with stricture    PMH of  . History of kidney stones   . Hyperlipidemia   . Hypertension   . Macular degeneration    glaucoma suspect  . Pancreatic cancer ([place]) 09/2015   adenocarcinoma of pancreas  . PONV (postoperative nausea and vomiting)   . Prostate cancer [facility])    prostate ; Dr Wyvonnia Lora to be monitored x 5 yrs now.  . Squamous papilloma    of esophagus      Patient Active Problem List   Diagnosis Date Noted  . Sepsis ([place]) 06/15/2016  . Dehydration 06/05/2016  . Post-op pain   . Acute pancreatitis   . Biliary obstruction   . Elevated LFTs   . Abnormal CT scan, gastrointestinal tract   . Port catheter in place 04/25/2016  . Bilateral leg edema 12/01/2015  . Malignant neoplasm of body of pancreas ([place]) 10/07/2015  . AP (abdominal pain) 09/07/2015  . Glaucoma 06/17/2015  . Macular degeneration 06/17/2015  . Dysphagia, pharyngoesophageal phase 09/23/2014  . Hypokalemia 03/17/2014  . ANGIOEDEMA 12/22/2009  . PROSTATE CANCER, HX OF 12/22/2009  . Hyperlipidemia 04/13/2009  . DIVERTICULOSIS, COLON 10/28/2007  . COLONIC POLYPS, HX OF 10/28/2007  . Diabetes type 2, controlled ([place]) 11/22/2006  . Essential hypertension 09/06/2006    Past Surgical History:  Procedure Laterality Date  . ANKLE FUSION  2004  . COLONOSCOPY  05/2012  . ERCP N/A 05/25/2016   Procedure: ENDOSCOPIC RETROGRADE CHOLANGIOPANCREATOGRAPHY (ERCP);  Surgeon: Ladene Artist, MD;  Location: [facility] ENDOSCOPY;  Service: Endoscopy;  Laterality: N/A;  . esophageal dilation  2002   Dr  Lucio Edward  . ESOPHAGOGASTRODUODENOSCOPY (EGD) WITH PROPOFOL N/A 09/30/2015   Procedure: ESOPHAGOGASTRODUODENOSCOPY (EGD) WITH PROPOFOL;  Surgeon: Milus Banister, MD;  Location: [facility] ENDOSCOPY;  Service: Endoscopy;  Laterality: N/A;  . EUS N/A 09/30/2015   Procedure: UPPER ENDOSCOPIC ULTRASOUND (EUS) RADIAL;  Surgeon: Milus Banister, MD;  Location: [facility] ENDOSCOPY;  Service: Endoscopy;  Laterality: N/A;  . FINE NEEDLE ASPIRATION N/A 09/30/2015   Procedure: FINE NEEDLE ASPIRATION (FNA) LINEAR;  Surgeon: Milus Banister, MD;  Location: [facility] ENDOSCOPY;  Service: Endoscopy;  Laterality: N/A;  . HERNIA REPAIR     bil inguinal hernias  . PARTIAL COLECTOMY     perforated diverticulitis  . PORTACATH PLACEMENT Left 10/13/2015   Procedure: INSERTION PORT-A-CATH;  Surgeon: [name], MD;  Location: [facility] ORS;   Service: General;  Laterality: Left;  . PROSTATE BIOPSY  03/29/2012   Procedure: BIOPSY TRANSRECTAL ULTRASONIC PROSTATE (TUBP);  Surgeon: Fredricka Bonine, MD;  Location: [facility];  Service: Urology;  Laterality: N/A;  . ROTATOR CUFF REPAIR  2006  . TOTAL HIP ARTHROPLASTY Left 2012       Home Medications    Prior to Admission medications   Medication Sig Start Date End Date Taking? Authorizing Provider  amLODipine (NORVASC) 10 MG tablet Take 1 tablet (10 mg total) by mouth daily. 11/01/15  Yes Binnie Rail, MD  aspirin 81 MG tablet Take 1 tablet (81 mg total) by mouth daily. 03/31/12  Yes Festus Aloe, MD  doxazosin (CARDURA) 1 MG tablet TAKE 1 TABLET BY MOUTH EVERY DAY 09/30/15  Yes Binnie Rail, MD  latanoprost (XALATAN) 0.005 % ophthalmic solution Place 1 drop into both eyes at bedtime. 05/25/15  Yes Historical Provider, MD  lidocaine-prilocaine (EMLA) cream Apply 1 application topically as needed. Apply to pac site 1 hour prior to stick and cover with plastic wrap Patient taking differently: Apply 1 application topically See admin instructions. Apply to pac site 1 hour prior to stick and cover with plastic wrap - approximately once a month 10/12/15  Yes Truitt Merle, MD  loperamide (IMODIUM) 2 MG capsule Take 4 mg by mouth as needed for diarrhea or loose stools.   Yes Historical Provider, MD  metFORMIN (GLUCOPHAGE) 500 MG tablet Take 1 tablet (500 mg total) by mouth 2 (two) times daily with a meal. 10/07/15  Yes Binnie Rail, MD  multivitamin-lutein [facility]) CAPS Take 1 capsule by mouth 2 (two) times daily.    Yes Historical Provider, MD  naproxen sodium (ALEVE) 220 MG tablet Take 220 mg by mouth daily as needed (pain/headache).   Yes Historical Provider, MD  OVER THE COUNTER MEDICATION Place 1 drop into both eyes daily. Over the counter lubricating eye drops   Yes Historical Provider, MD  oxyCODONE (OXY IR/ROXICODONE) 5 MG immediate release tablet 1-2 tabs PO Q  6 hours PRN pain Patient taking differently: Take 5-10 mg by mouth every 6 (six) hours as needed for moderate pain.  06/05/16  Yes Susanne Borders, NP  potassium chloride 20 MEQ/15ML (10%) SOLN Take 15 mLs (20 mEq total) by mouth daily. 05/26/16  Yes Dron Tanna Furry, MD  pravastatin (PRAVACHOL) 20 MG tablet Take 1 tablet (20 mg total) by mouth at bedtime. 11/01/15  Yes Binnie Rail, MD  hydrochlorothiazide (MICROZIDE) 12.5 MG capsule Take 1 capsule (12.5 mg total) by mouth daily. Patient not taking: Reported on 06/10/2016 10/11/15   Binnie Rail, MD  HYDROcodone-acetaminophen (NORCO) 5-325 MG tablet Take 1 tablet by mouth every 6 (six) hours as needed for moderate pain. Patient not taking: Reported on 06/05/2016 05/26/16   Dron Tanna Furry, MD  [facility] DELICA LANCETS 43X MISC Use to check blood sugars once a day Dx E11.9 Patient not taking: Reported on 06/17/2016 09/27/15   Binnie Rail, MD  [facility] VERIO test strip  CHECK BLOOD SUGAR ONCE DAILY. DX250.00 Patient not taking: Reported on 06/12/2016 11/23/14   Hendricks Limes, MD  traMADol (ULTRAM) 50 MG tablet Take 0.5 tablets (25 mg total) by mouth every 6 (six) hours as needed. Patient not taking: Reported on 06/22/2016 05/23/16   Truitt Merle, MD    Family History Family History  Problem Relation Age of Onset  . Kidney disease Father     ? Rheumatic fever as child  . Hypertension Father   . Heart attack Paternal Uncle     in 62s  . COPD Paternal Uncle     due to exposures in [facility]  . Kidney disease Paternal Aunt     ? Rheumatic fever as child  . Cancer Neg Hx   . Diabetes Neg Hx   . Stroke Neg Hx   . Colon cancer Neg Hx     Social History Social History  Substance Use Topics  . Smoking status: Former Smoker    Packs/day: 0.50    Years: 40.00    Types: Cigarettes    Quit date: 03/27/1989  . Smokeless tobacco: Never Used     Comment: smoked [place], up to 1 ppd  . Alcohol use Yes     Comment: social     Allergies   Ace  inhibitors; Angiotensin receptor blockers; and Ciprofloxacin   Review of Systems Review of Systems 10/14 systems reviewed and are negative other than those stated in the HPI   Physical Exam Updated Vital Signs BP 108/71 (BP Location: Right Arm)   Pulse (!) 107   Temp 97.5 F (36.4 C) (Axillary)   Resp (!) 24   Ht 5\' 10"  (1.778 m)   Wt 123 lb 0.3 oz (55.8 kg)   SpO2 99%   BMI 17.65 kg/m   Physical Exam Physical Exam  Nursing note and vitals reviewed. Constitutional: Chronically ill-appearing, cachectic and malnourished, in mild respiratory distress Head: Normocephalic and atraumatic.  Mouth/Throat: Oropharynx is clear and dry mucous membranes.  Neck: Normal range of motion. Neck supple.  Cardiovascular: Tachycardic rate and regular rhythm.   Pulmonary/Chest: Tachypnea but no conversational dyspnea.  Abdominal: Soft. Moderate distention There is right-sided tenderness. There is no rebound and no guarding.  Musculoskeletal: Normal range of motion.  Neurological: Alert, no facial droop, fluent speech, moves all extremities symmetrically Skin: Skin is warm and dry.  Psychiatric: Cooperative   ED Treatments / Results  Labs (all labs ordered are listed, but only abnormal results are displayed) Labs Reviewed  COMPREHENSIVE METABOLIC PANEL - Abnormal; Notable for the following:       Result Value   CO2 15 (*)    Glucose, Bld 171 (*)    BUN 21 (*)    Creatinine, Ser 1.32 (*)    Calcium 7.5 (*)    Total Protein 5.4 (*)    Albumin 2.2 (*)    AST <5 (*)    ALT <5 (*)    Alkaline Phosphatase 237 (*)    Total Bilirubin 4.6 (*)    GFR calc non Af Amer 50 (*)    GFR calc Af Amer 58 (*)    Anion gap 19 (*)    All other components within normal limits  CBC WITH DIFFERENTIAL/PLATELET - Abnormal; Notable for the following:    WBC 12.9 (*)    RBC 3.87 (*)    Hemoglobin 10.2 (*)    HCT 31.4 (*)    RDW 16.3 (*)    Platelets 116 (*)  Neutro Abs 12.6 (*)    Lymphs Abs 0.1  (*)    All other components within normal limits  PROTIME-INR - Abnormal; Notable for the following:    Prothrombin Time 18.5 (*)    All other components within normal limits  LIPASE, BLOOD - Abnormal; Notable for the following:    Lipase <10 (*)    All other components within normal limits  I-STAT CG4 LACTIC ACID, ED - Abnormal; Notable for the following:    Lactic Acid, Venous 10.09 (*)    All other components within normal limits  CULTURE, BLOOD (ROUTINE X 2)  CULTURE, BLOOD (ROUTINE X 2)  URINALYSIS, ROUTINE W REFLEX MICROSCOPIC  I-STAT TROPOININ, ED  I-STAT CG4 LACTIC ACID, ED    EKG  EKG Interpretation None       Radiology Dg Chest 2 View  Result Date: 06/17/2016 CLINICAL DATA:  Chest tightness EXAM: CHEST  2 VIEW COMPARISON:  05/25/16 FINDINGS: There is a left chest wall port a catheter with tip in the cavoatrial junction. Heart size appears normal. No pleural effusion or edema. No airspace opacities. The visualized osseous structures are unremarkable. IMPRESSION: No active cardiopulmonary disease. Electronically Signed   By: Kerby Moors M.D.   On: 06/13/2016 20:36   Ct Angio Chest Pe W And/or Wo Contrast  Result Date: 05/31/2016 CLINICAL DATA:  Acute onset of shortness of breath and chest tightness. Generalized abdominal pain. Loose stools. Current history of pancreatic cancer, on chemotherapy. Initial encounter. EXAM: CT ANGIOGRAPHY CHEST CT ABDOMEN AND PELVIS WITH CONTRAST TECHNIQUE: Multidetector CT imaging of the chest was performed using the standard protocol during bolus administration of intravenous contrast. Multiplanar CT image reconstructions and MIPs were obtained to evaluate the vascular anatomy. Multidetector CT imaging of the abdomen and pelvis was performed using the standard protocol during bolus administration of intravenous contrast. CONTRAST:  100 mL of Isovue 370 IV contrast COMPARISON:  CT of the chest, abdomen and pelvis performed 05/19/2016 FINDINGS:  CTA CHEST FINDINGS Cardiovascular:  There is no evidence of pulmonary embolus. The heart is borderline normal in size. Scattered calcification is noted along the aortic arch and descending thoracic aorta. The great vessels are grossly unremarkable in appearance, though difficult to fully assess due to artifact from contrast and beam hardening artifact. Mediastinum/Nodes: The mediastinum is grossly unremarkable in appearance. No mediastinal lymphadenopathy is seen. Trace pericardial fluid remains within normal limits. The thyroid gland is not well characterized. No axillary lymphadenopathy is seen. A left-sided chest port is noted ending about the distal SVC. 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 Lungs/Pleura: Patchy bibasilar airspace opacities raise concern for mild infection. Mild fibrotic change and emphysematous change are noted bilaterally. No pleural effusion or pneumothorax is seen. No dominant mass is identified. Musculoskeletal: No acute osseous abnormalities are identified. Mild degenerative change is noted at the lower cervical spine. The visualized musculature is unremarkable in appearance. Review of the MIP images confirms the above findings. CT ABDOMEN and PELVIS FINDINGS Hepatobiliary: Diffuse periportal edema is noted within the liver. A common bile duct stent is seen in expected position. No definite intrahepatic biliary ductal dilatation is seen. There is significant gallbladder wall thickening, with marked distention of the gallbladder, concerning for acute cholecystitis. Trace ascites is seen tracking about the liver. Pancreas: The known pancreatic mass is difficult to fully characterize, with vague soft tissue tracking about the portal venous system and body of the  pancreas. There is increasing wall thickening involving the antrum of the stomach. Underlying infiltration of mass cannot be excluded.  Vague soft tissue density is seen tracking about the mesentery and underlying retroperitoneum more inferiorly. There is near complete effacement of the superior mesenteric vein, new from the prior study and raising suspicion for compression by the underlying mass. Spleen: The spleen is unremarkable in appearance. Adrenals/Urinary Tract: The adrenal glands are unremarkable in appearance. There is mild-to-moderate right-sided hydronephrosis, raising suspicion for ureteral obstruction at the lower abdomen, likely reflecting underlying diffuse infiltrative mass. This is also new from the prior study. Scattered bilateral renal cysts are seen. Nonspecific perinephric stranding is noted bilaterally. No obstructing ureteral stones are identified. Stomach/Bowel: There is swirling of the superior mesenteric artery and vein, concerning for some degree of twisting of the small bowel about the mesenteric axis, without evidence for bowel ischemia at this time. As described above, there is near complete effacement of the superior mesenteric vein due to surrounding mass. Additional scattered peritoneal metastases are seen tracking about the omentum at the left lower quadrant, and along the paracolic gutters bilaterally, suspicious for worsening diffuse peritoneal carcinomatosis. The stomach is mildly distended with fluid and air. Vague wall thickening is noted along the ascending colon, of uncertain significance. Scattered diverticulosis is seen along the proximal sigmoid colon. Postoperative change is noted at the right lower quadrant and at the sigmoid colon. Vascular/Lymphatic: Diffuse calcification is seen along the abdominal aorta and its branches. The abdominal aorta is otherwise grossly unremarkable. The inferior vena cava is grossly unremarkable. No retroperitoneal lymphadenopathy is seen. No pelvic sidewall lymphadenopathy is identified. Reproductive: The bladder is relatively decompressed and not well assessed. The  prostate is enlarged, measuring perhaps 6.1 cm in transverse dimension. Other: No additional soft tissue abnormalities are seen. Musculoskeletal: No acute osseous abnormalities are identified. The patient left hip arthroplasty is grossly unremarkable in appearance. Degenerative change is noted at the right hip. There is chronic osseous fusion at L4-L5. Multilevel vacuum phenomenon is seen along the lumbar spine. The visualized musculature is unremarkable in appearance. Review of the MIP images confirms the above findings. IMPRESSION: 1. No evidence of pulmonary embolus. 2. Significant gallbladder wall thickening, with marked distention of the gallbladder, concerning for acute cholecystitis, new from the prior study. 3. Patchy bibasilar airspace opacities raise concern for mild infection. Mild fibrotic change and emphysema noted bilaterally. 4. Known pancreatic mass is difficult to fully characterize, with vague soft tissue tracking about the portal venous system and body of the pancreas. Increasing wall thickening involving the antrum of the stomach; underlying infiltrative mass cannot be excluded. 5. Vague soft tissue density tracks about the mesentery and more inferior retroperitoneum, worsened from the prior study, with new near complete effacement of the superior mesenteric vein, raising suspicion for compression by the underlying mass. 6. Mild-to-moderate right-sided hydronephrosis raises concern for partial right ureteral obstruction at the lower abdomen due to the underlying infiltrating mass. 7. Swirling of the superior mesenteric artery and vein, concerning for some degree of twisting of small bowel about the vague mass along the mesenteric axis, without evidence for bowel ischemia. 8. Additional scattered peritoneal masses tracking about the omentum at the left lower quadrant, and at the paracolic gutters bilaterally, suspicious for worsening diffuse peritoneal carcinomatosis. 9. Vague wall thickening  along the ascending colon, of uncertain significance. Underlying infiltrative mass cannot be excluded. 10. Diffuse periportal edema noted. Common bile duct stent noted in expected position. Trace ascites noted about the liver.  11. 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 Associated diffuse filling of collateral vessels about the chest wall and neck. 12. Mild degenerative change at the lower cervical spine. Chronic osseous fusion at L4-L5, and degenerative change along the lumbar spine. Degenerative change at the right hip. 13. Diffuse aortic atherosclerosis. 14. Scattered diverticulosis along the proximal sigmoid colon, without evidence of diverticulitis. 15. Enlarged prostate noted. These results were called by telephone at the time of interpretation on 06/16/2016 at 11:08 pm to Dr. Brantley Stage, who verbally acknowledged these results. Electronically Signed   By: Garald Balding M.D.   On: 06/11/2016 23:10   Ct Abdomen Pelvis W Contrast  Result Date: 06/02/2016 CLINICAL DATA:  Acute onset of shortness of breath and chest tightness. Generalized abdominal pain. Loose stools. Current history of pancreatic cancer, on chemotherapy. Initial encounter. EXAM: CT ANGIOGRAPHY CHEST CT ABDOMEN AND PELVIS WITH CONTRAST TECHNIQUE: Multidetector CT imaging of the chest was performed using the standard protocol during bolus administration of intravenous contrast. Multiplanar CT image reconstructions and MIPs were obtained to evaluate the vascular anatomy. Multidetector CT imaging of the abdomen and pelvis was performed using the standard protocol during bolus administration of intravenous contrast. CONTRAST:  100 mL of Isovue 370 IV contrast COMPARISON:  CT of the chest, abdomen and pelvis performed 05/19/2016 FINDINGS: CTA CHEST FINDINGS Cardiovascular:  There is no evidence of pulmonary embolus. The heart is borderline normal in size. Scattered calcification is noted along the  aortic arch and descending thoracic aorta. The great vessels are grossly unremarkable in appearance, though difficult to fully assess due to artifact from contrast and beam hardening artifact. Mediastinum/Nodes: The mediastinum is grossly unremarkable in appearance. No mediastinal lymphadenopathy is seen. Trace pericardial fluid remains within normal limits. The thyroid gland is not well characterized. No axillary lymphadenopathy is seen. A left-sided chest port is noted ending about the distal SVC. 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 Lungs/Pleura: Patchy bibasilar airspace opacities raise concern for mild infection. Mild fibrotic change and emphysematous change are noted bilaterally. No pleural effusion or pneumothorax is seen. No dominant mass is identified. Musculoskeletal: No acute osseous abnormalities are identified. Mild degenerative change is noted at the lower cervical spine. The visualized musculature is unremarkable in appearance. Review of the MIP images confirms the above findings. CT ABDOMEN and PELVIS FINDINGS Hepatobiliary: Diffuse periportal edema is noted within the liver. A common bile duct stent is seen in expected position. No definite intrahepatic biliary ductal dilatation is seen. There is significant gallbladder wall thickening, with marked distention of the gallbladder, concerning for acute cholecystitis. Trace ascites is seen tracking about the liver. Pancreas: The known pancreatic mass is difficult to fully characterize, with vague soft tissue tracking about the portal venous system and body of the pancreas. There is increasing wall thickening involving the antrum of the stomach. Underlying infiltration of mass cannot be excluded. Vague soft tissue density is seen tracking about the mesentery and underlying retroperitoneum more inferiorly. There is near complete effacement of the superior  mesenteric vein, new from the prior study and raising suspicion for compression by the underlying mass. Spleen: The spleen is unremarkable in appearance. Adrenals/Urinary Tract: The adrenal glands are unremarkable in appearance. There is mild-to-moderate right-sided hydronephrosis, raising suspicion for ureteral obstruction at the lower abdomen, likely reflecting underlying diffuse infiltrative mass. This is also new from the prior study. Scattered bilateral renal cysts  are seen. Nonspecific perinephric stranding is noted bilaterally. No obstructing ureteral stones are identified. Stomach/Bowel: There is swirling of the superior mesenteric artery and vein, concerning for some degree of twisting of the small bowel about the mesenteric axis, without evidence for bowel ischemia at this time. As described above, there is near complete effacement of the superior mesenteric vein due to surrounding mass. Additional scattered peritoneal metastases are seen tracking about the omentum at the left lower quadrant, and along the paracolic gutters bilaterally, suspicious for worsening diffuse peritoneal carcinomatosis. The stomach is mildly distended with fluid and air. Vague wall thickening is noted along the ascending colon, of uncertain significance. Scattered diverticulosis is seen along the proximal sigmoid colon. Postoperative change is noted at the right lower quadrant and at the sigmoid colon. Vascular/Lymphatic: Diffuse calcification is seen along the abdominal aorta and its branches. The abdominal aorta is otherwise grossly unremarkable. The inferior vena cava is grossly unremarkable. No retroperitoneal lymphadenopathy is seen. No pelvic sidewall lymphadenopathy is identified. Reproductive: The bladder is relatively decompressed and not well assessed. The prostate is enlarged, measuring perhaps 6.1 cm in transverse dimension. Other: No additional soft tissue abnormalities are seen. Musculoskeletal: No acute osseous  abnormalities are identified. The patient left hip arthroplasty is grossly unremarkable in appearance. Degenerative change is noted at the right hip. There is chronic osseous fusion at L4-L5. Multilevel vacuum phenomenon is seen along the lumbar spine. The visualized musculature is unremarkable in appearance. Review of the MIP images confirms the above findings. IMPRESSION: 1. No evidence of pulmonary embolus. 2. Significant gallbladder wall thickening, with marked distention of the gallbladder, concerning for acute cholecystitis, new from the prior study. 3. Patchy bibasilar airspace opacities raise concern for mild infection. Mild fibrotic change and emphysema noted bilaterally. 4. Known pancreatic mass is difficult to fully characterize, with vague soft tissue tracking about the portal venous system and body of the pancreas. Increasing wall thickening involving the antrum of the stomach; underlying infiltrative mass cannot be excluded. 5. Vague soft tissue density tracks about the mesentery and more inferior retroperitoneum, worsened from the prior study, with new near complete effacement of the superior mesenteric vein, raising suspicion for compression by the underlying mass. 6. Mild-to-moderate right-sided hydronephrosis raises concern for partial right ureteral obstruction at the lower abdomen due to the underlying infiltrating mass. 7. Swirling of the superior mesenteric artery and vein, concerning for some degree of twisting of small bowel about the vague mass along the mesenteric axis, without evidence for bowel ischemia. 8. Additional scattered peritoneal masses tracking about the omentum at the left lower quadrant, and at the paracolic gutters bilaterally, suspicious for worsening diffuse peritoneal carcinomatosis. 9. Vague wall thickening along the ascending colon, of uncertain significance. Underlying infiltrative mass cannot be excluded. 10. Diffuse periportal edema noted. Common bile duct stent noted  in expected position. Trace ascites noted about the liver. 11. 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 Associated diffuse filling of collateral vessels about the chest wall and neck. 12. Mild degenerative change at the lower cervical spine. Chronic osseous fusion at L4-L5, and degenerative change along the lumbar spine. Degenerative change at the right hip. 13. Diffuse aortic atherosclerosis. 14. Scattered diverticulosis along the proximal sigmoid colon, without evidence of diverticulitis. 15. Enlarged prostate noted. These results were called by telephone at the time of interpretation on 06/03/2016 at 11:08 pm to Dr. Brantley Stage, who verbally acknowledged these results. Electronically Signed   By: Francoise Schaumann.D.  On: 06/15/2016 23:10   Dg Chest Portable 1 View  Result Date: 05/27/2016 CLINICAL DATA:  Acute onset of generalized weakness and shortness of breath. Generalized chest tightness and dry mouth. Initial encounter. EXAM: PORTABLE CHEST 1 VIEW COMPARISON:  Chest radiograph and CTA of the chest performed earlier today at 10:14 p.m. FINDINGS: The lungs are well-aerated. Worsening patchy bilateral airspace opacification is noted. This raises suspicion for pulmonary edema, given interval change since the prior CTA, and worsening interstitial prominence. No pleural effusion or pneumothorax is seen. The cardiomediastinal silhouette is normal in size. No acute osseous abnormalities are identified. A left-sided chest port is noted ending about the distal SVC. IMPRESSION: Worsening patchy bilateral airspace opacification is suspicious for pulmonary edema. Pneumonia is considered less likely but remains a possibility. Electronically Signed   By: Garald Balding M.D.   On: 06/07/2016 23:38    Procedures Procedures (including critical care time) CRITICAL CARE Performed by: Forde Dandy   Total critical care time: 45 minutes  Critical care time was  exclusive of separately billable procedures and treating other patients.  Critical care was necessary to treat or prevent imminent or life-threatening deterioration.  Critical care was time spent personally by me on the following activities: development of treatment plan with patient and/or surrogate as well as nursing, discussions with consultants, evaluation of patient's response to treatment, examination of patient, obtaining history from patient or surrogate, ordering and performing treatments and interventions, ordering and review of laboratory studies, ordering and review of radiographic studies, pulse oximetry and re-evaluation of patient's condition.    Medications Ordered in ED Medications  antiseptic oral rinse (BIOTENE) solution 15 mL (not administered)  sodium chloride 0.9 % bolus 1,000 mL (0 mLs Intravenous Stopped 05/31/2016 2202)  sodium chloride 0.9 % bolus 1,000 mL (0 mLs Intravenous Stopped 06/04/2016 2203)  vancomycin (VANCOCIN) IVPB 1000 mg/200 mL premix (0 mg Intravenous Stopped 06/09/2016 2202)  piperacillin-tazobactam (ZOSYN) IVPB 3.375 g (0 g Intravenous Stopped 06/13/2016 2140)  iopamidol (ISOVUE-370) 76 % injection (100 mLs  Contrast Given 06/18/2016 2202)     Initial Impression / Assessment and Plan / ED Course  I have reviewed the triage vital signs and the nursing notes.  Pertinent labs & imaging results that were available during my care of the patient were reviewed by me and considered in my medical decision making (see chart for details).    Presenting with shortness of breath and generalized weakness. Is tachycardic on presentation, with mild hypotension and systolic blood pressures in the 90s. Is afebrile. To But not hypoxic and placed on 2 L nasal cannula for comfort.  Sepsis workup was initiated. He has a lactic acid of 10, leukocytosis of 12, and acute kidney injury. Was covered empirically with vancomycin and Zosyn. Received 2 L of IV fluids, with improvement in  heart rate and blood pressure. Chest x-ray visualized and does not show acute cardio pulmonary processes. UA is pending.  He did undergo CT chest to evaluate for PE and CT abdomen and pelvis given abdominal pain and significantly elevated lactic acid. CT chest abdomen and pelvis are visualized. It is reviewed with radiology. CT chest does not show pneumonia but he may have basilar opacities that are suggestive of pneumonia. CT abdomen pelvis shows worsening cancer. There is new acute cholecystitis. Unlikely to be surgical candidate, but discussed with Dr. Hulen Skains. He recommends IR consult as an inpatient for percutaneous tube.  During ED course he does suddenly become hypoxic and more short of breath.  Sounds more rhonchorous on exam, and repeat chest x-ray shows some pulmonary edema. He is placed on BiPAP.  Spoke with patient about code status. He is DNR.   Discussed with Dr. Hal Hope who will admit for ongoing management.  Final Clinical Impressions(s) / ED Diagnoses   Final diagnoses:  Sepsis, due to unspecified organism ([place])  Lobar pneumonia ([place])  Acute cholecystitis  Elevated lactic acid level  AKI (acute kidney [facility])    New Prescriptions New Prescriptions   No medications on file     Forde Dandy, MD 06/15/16 [phone]

## 2016-06-14 NOTE — Telephone Encounter (Signed)
Voicemail received from patient's wife.  "Keen received chemotherapy yesterday, woke up early this morning with chills, Temp = 100.0 at 8:00 am.  Called spoke with Amy, were told to call for an appointment with Dr.Feng within twenty-four hours."   Returned call wife says "he's not doing so well.  He's weak, three diarrhea stools this morning."  Eluzer on phone reports "Pain in center of stomach near navel, slightly tender to touch with a hard lump.  No nausea.  Ate a small amount oatmeal this morning.  Has not tried Imodium.  Is the [facility] BRAND the same and okay to use" Called Dr.Feng to report this information.  Verbal order received and read back from Dr. Burr Medico for patient to hold HCTZ as instructed yesterday, drink lots of fluid for hydration after chemotherapy, monitor diarrhea at home.   Call if abdominal pain increases, recurrent fever or diarrhea progresses worsen.    Called patient and spouse with provider orders.  Answered questions about imodium.  "Temperature fluctuates = 96.9 at 9:30 am.  What about the pain?  At 5:00 am took Aleve due to pain rate as ten on pain scale.  Helped a little and now pain equals a seven or eight.  I do not take the oxycodone because it doesn't work." Recommended heat pad as an alternative to medications.  Pain at level five is best time to take pain medications and continue until some relief obtained.  Encouraged use of OxyContin as ordered.  If pain above level five need to take more consistently to obtain relief.  Imodium should be continued until he goes twelve hours without a BM.  At 9:53 took two imodium.  No further questions.

## 2016-06-15 ENCOUNTER — Inpatient Hospital Stay (HOSPITAL_COMMUNITY): Payer: Medicare Other

## 2016-06-15 ENCOUNTER — Telehealth: Payer: Self-pay | Admitting: *Deleted

## 2016-06-15 ENCOUNTER — Encounter (HOSPITAL_COMMUNITY): Payer: Self-pay | Admitting: Internal Medicine

## 2016-06-15 DIAGNOSIS — C786 Secondary malignant neoplasm of retroperitoneum and peritoneum: Secondary | ICD-10-CM

## 2016-06-15 DIAGNOSIS — Z66 Do not resuscitate: Secondary | ICD-10-CM | POA: Diagnosis present

## 2016-06-15 DIAGNOSIS — K810 Acute cholecystitis: Secondary | ICD-10-CM | POA: Diagnosis not present

## 2016-06-15 DIAGNOSIS — R7881 Bacteremia: Secondary | ICD-10-CM

## 2016-06-15 DIAGNOSIS — D649 Anemia, unspecified: Secondary | ICD-10-CM | POA: Diagnosis present

## 2016-06-15 DIAGNOSIS — A4151 Sepsis due to Escherichia coli [E. coli]: Principal | ICD-10-CM

## 2016-06-15 DIAGNOSIS — E119 Type 2 diabetes mellitus without complications: Secondary | ICD-10-CM | POA: Diagnosis present

## 2016-06-15 DIAGNOSIS — J9601 Acute respiratory failure with hypoxia: Secondary | ICD-10-CM

## 2016-06-15 DIAGNOSIS — Z515 Encounter for palliative care: Secondary | ICD-10-CM | POA: Diagnosis not present

## 2016-06-15 DIAGNOSIS — E860 Dehydration: Secondary | ICD-10-CM | POA: Diagnosis present

## 2016-06-15 DIAGNOSIS — Z87891 Personal history of nicotine dependence: Secondary | ICD-10-CM | POA: Diagnosis not present

## 2016-06-15 DIAGNOSIS — Z96642 Presence of left artificial hip joint: Secondary | ICD-10-CM | POA: Diagnosis present

## 2016-06-15 DIAGNOSIS — H3530 Unspecified macular degeneration: Secondary | ICD-10-CM | POA: Diagnosis present

## 2016-06-15 DIAGNOSIS — R509 Fever, unspecified: Secondary | ICD-10-CM | POA: Diagnosis not present

## 2016-06-15 DIAGNOSIS — Z681 Body mass index (BMI) 19 or less, adult: Secondary | ICD-10-CM | POA: Diagnosis not present

## 2016-06-15 DIAGNOSIS — C251 Malignant neoplasm of body of pancreas: Secondary | ICD-10-CM

## 2016-06-15 DIAGNOSIS — Z7982 Long term (current) use of aspirin: Secondary | ICD-10-CM | POA: Diagnosis not present

## 2016-06-15 DIAGNOSIS — I10 Essential (primary) hypertension: Secondary | ICD-10-CM | POA: Diagnosis present

## 2016-06-15 DIAGNOSIS — A419 Sepsis, unspecified organism: Secondary | ICD-10-CM | POA: Diagnosis not present

## 2016-06-15 DIAGNOSIS — J189 Pneumonia, unspecified organism: Secondary | ICD-10-CM | POA: Diagnosis present

## 2016-06-15 DIAGNOSIS — N179 Acute kidney failure, unspecified: Secondary | ICD-10-CM | POA: Diagnosis not present

## 2016-06-15 DIAGNOSIS — E46 Unspecified protein-calorie malnutrition: Secondary | ICD-10-CM | POA: Diagnosis present

## 2016-06-15 DIAGNOSIS — J181 Lobar pneumonia, unspecified organism: Secondary | ICD-10-CM | POA: Diagnosis not present

## 2016-06-15 DIAGNOSIS — Z9221 Personal history of antineoplastic chemotherapy: Secondary | ICD-10-CM | POA: Diagnosis not present

## 2016-06-15 DIAGNOSIS — F419 Anxiety disorder, unspecified: Secondary | ICD-10-CM | POA: Diagnosis present

## 2016-06-15 DIAGNOSIS — E785 Hyperlipidemia, unspecified: Secondary | ICD-10-CM | POA: Diagnosis present

## 2016-06-15 DIAGNOSIS — D696 Thrombocytopenia, unspecified: Secondary | ICD-10-CM | POA: Diagnosis present

## 2016-06-15 DIAGNOSIS — J811 Chronic pulmonary edema: Secondary | ICD-10-CM | POA: Diagnosis present

## 2016-06-15 DIAGNOSIS — K219 Gastro-esophageal reflux disease without esophagitis: Secondary | ICD-10-CM | POA: Diagnosis present

## 2016-06-15 HISTORY — PX: IR GENERIC HISTORICAL: IMG1180011

## 2016-06-15 LAB — COMPREHENSIVE METABOLIC PANEL
ALK PHOS: 234 U/L — AB (ref 38–126)
ALT: 156 U/L — AB (ref 17–63)
AST: 208 U/L — ABNORMAL HIGH (ref 15–41)
Albumin: 2.1 g/dL — ABNORMAL LOW (ref 3.5–5.0)
Anion gap: 20 — ABNORMAL HIGH (ref 5–15)
BUN: 23 mg/dL — ABNORMAL HIGH (ref 6–20)
CALCIUM: 7.2 mg/dL — AB (ref 8.9–10.3)
CO2: 11 mmol/L — ABNORMAL LOW (ref 22–32)
CREATININE: 1.27 mg/dL — AB (ref 0.61–1.24)
Chloride: 104 mmol/L (ref 101–111)
GFR, EST NON AFRICAN AMERICAN: 52 mL/min — AB (ref >60)
Glucose, Bld: 144 mg/dL — ABNORMAL HIGH (ref 65–99)
Potassium: 4.1 mmol/L (ref 3.5–5.1)
Sodium: 135 mmol/L (ref 135–145)
Total Bilirubin: 5.5 mg/dL — ABNORMAL HIGH (ref 0.3–1.2)
Total Protein: 5.5 g/dL — ABNORMAL LOW (ref 6.5–8.1)

## 2016-06-15 LAB — CBC WITH DIFFERENTIAL/PLATELET
BASOS PCT: 0 %
Basophils Absolute: 0 10*3/uL (ref 0.0–0.1)
EOS PCT: 3 %
Eosinophils Absolute: 0.4 10*3/uL (ref 0.0–0.7)
HCT: 27 % — ABNORMAL LOW (ref 39.0–52.0)
HEMOGLOBIN: 8.7 g/dL — AB (ref 13.0–17.0)
Lymphocytes Relative: 2 %
Lymphs Abs: 0.2 10*3/uL — ABNORMAL LOW (ref 0.7–4.0)
MCH: 26.7 pg (ref 26.0–34.0)
MCHC: 32.2 g/dL (ref 30.0–36.0)
MCV: 82.8 fL (ref 78.0–100.0)
MONO ABS: 0.1 10*3/uL (ref 0.1–1.0)
Monocytes Relative: 1 %
NEUTROS ABS: 11.4 10*3/uL — AB (ref 1.7–7.7)
NEUTROS PCT: 94 %
Platelets: 86 10*3/uL — ABNORMAL LOW (ref 150–400)
RBC: 3.26 MIL/uL — ABNORMAL LOW (ref 4.22–5.81)
RDW: 17.3 % — ABNORMAL HIGH (ref 11.5–15.5)
WBC: 12.1 10*3/uL — ABNORMAL HIGH (ref 4.0–10.5)

## 2016-06-15 LAB — URINALYSIS, ROUTINE W REFLEX MICROSCOPIC
Bacteria, UA: NONE SEEN
Bilirubin Urine: NEGATIVE
Glucose, UA: NEGATIVE mg/dL
Hgb urine dipstick: NEGATIVE
KETONES UR: NEGATIVE mg/dL
Leukocytes, UA: NEGATIVE
Nitrite: NEGATIVE
PH: 5 (ref 5.0–8.0)
Protein, ur: 30 mg/dL — AB
SPECIFIC GRAVITY, URINE: 1.038 — AB (ref 1.005–1.030)

## 2016-06-15 LAB — BLOOD CULTURE ID PANEL (REFLEXED)
Acinetobacter baumannii: NOT DETECTED
CANDIDA ALBICANS: NOT DETECTED
CANDIDA GLABRATA: NOT DETECTED
CANDIDA TROPICALIS: NOT DETECTED
Candida krusei: NOT DETECTED
Candida parapsilosis: NOT DETECTED
Carbapenem resistance: NOT DETECTED
ENTEROBACTERIACEAE SPECIES: DETECTED — AB
ESCHERICHIA COLI: DETECTED — AB
Enterobacter cloacae complex: NOT DETECTED
Enterococcus species: NOT DETECTED
HAEMOPHILUS INFLUENZAE: NOT DETECTED
KLEBSIELLA PNEUMONIAE: NOT DETECTED
Klebsiella oxytoca: NOT DETECTED
Listeria monocytogenes: NOT DETECTED
NEISSERIA MENINGITIDIS: NOT DETECTED
PROTEUS SPECIES: NOT DETECTED
PSEUDOMONAS AERUGINOSA: NOT DETECTED
STREPTOCOCCUS AGALACTIAE: NOT DETECTED
STREPTOCOCCUS PYOGENES: NOT DETECTED
STREPTOCOCCUS SPECIES: NOT DETECTED
Serratia marcescens: NOT DETECTED
Staphylococcus aureus (BCID): NOT DETECTED
Staphylococcus species: NOT DETECTED
Streptococcus pneumoniae: NOT DETECTED

## 2016-06-15 LAB — GLUCOSE, CAPILLARY: GLUCOSE-CAPILLARY: 138 mg/dL — AB (ref 65–99)

## 2016-06-15 LAB — APTT: aPTT: 45 seconds — ABNORMAL HIGH (ref 24–36)

## 2016-06-15 LAB — MRSA PCR SCREENING: MRSA BY PCR: NEGATIVE

## 2016-06-15 LAB — TROPONIN I
Troponin I: 0.09 ng/mL (ref <0.03)
Troponin I: 0.17 ng/mL (ref <0.03)

## 2016-06-15 LAB — PROTIME-INR
INR: 1.49
Prothrombin Time: 18.2 seconds — ABNORMAL HIGH (ref 11.4–15.2)

## 2016-06-15 LAB — PROCALCITONIN: Procalcitonin: 65.05 ng/mL

## 2016-06-15 LAB — LACTIC ACID, PLASMA
LACTIC ACID, VENOUS: 7.8 mmol/L — AB (ref 0.5–1.9)
LACTIC ACID, VENOUS: 8.3 mmol/L — AB (ref 0.5–1.9)

## 2016-06-15 MED ORDER — FENTANYL CITRATE (PF) 100 MCG/2ML IJ SOLN
INTRAMUSCULAR | Status: AC
  Filled 2016-06-15: qty 2

## 2016-06-15 MED ORDER — FENTANYL CITRATE (PF) 100 MCG/2ML IJ SOLN
25.0000 ug | INTRAMUSCULAR | Status: DC | PRN
  Administered 2016-06-16 (×7): 25 ug via INTRAVENOUS
  Filled 2016-06-15 (×7): qty 2

## 2016-06-15 MED ORDER — LATANOPROST 0.005 % OP SOLN
1.0000 [drp] | Freq: Every day | OPHTHALMIC | Status: DC
  Administered 2016-06-15: 1 [drp] via OPHTHALMIC
  Filled 2016-06-15: qty 2.5

## 2016-06-15 MED ORDER — LORAZEPAM 2 MG/ML IJ SOLN
0.5000 mg | INTRAMUSCULAR | Status: DC | PRN
  Administered 2016-06-15 (×2): 0.5 mg via INTRAVENOUS
  Filled 2016-06-15 (×2): qty 1

## 2016-06-15 MED ORDER — ACETAMINOPHEN 325 MG PO TABS: 650.0000 mg | ORAL_TABLET | Freq: Four times a day (QID) | ORAL | Status: DC | PRN

## 2016-06-15 MED ORDER — IOPAMIDOL (ISOVUE-300) INJECTION 61%
INTRAVENOUS | Status: AC
  Administered 2016-06-15: 10 mL
  Filled 2016-06-15: qty 50

## 2016-06-15 MED ORDER — LIDOCAINE HCL (PF) 1 % IJ SOLN
INTRAMUSCULAR | Status: AC
  Filled 2016-06-15: qty 30

## 2016-06-15 MED ORDER — LORAZEPAM 2 MG/ML IJ SOLN
1.0000 mg | INTRAMUSCULAR | Status: DC | PRN
  Administered 2016-06-15 – 2016-06-16 (×4): 1 mg via INTRAVENOUS
  Filled 2016-06-15 (×3): qty 1

## 2016-06-15 MED ORDER — LORAZEPAM 2 MG/ML IJ SOLN
0.5000 mg | Freq: Once | INTRAMUSCULAR | Status: AC
  Administered 2016-06-15: 0.5 mg via INTRAVENOUS
  Filled 2016-06-15: qty 1

## 2016-06-15 MED ORDER — MEROPENEM 1 G IV SOLR
1.0000 g | Freq: Two times a day (BID) | INTRAVENOUS | Status: DC
  Administered 2016-06-15 (×2): 1 g via INTRAVENOUS
  Filled 2016-06-15 (×3): qty 1

## 2016-06-15 MED ORDER — VANCOMYCIN HCL IN DEXTROSE 1-5 GM/200ML-% IV SOLN: 1000.0000 mg | Freq: Once | INTRAVENOUS | Status: DC

## 2016-06-15 MED ORDER — VANCOMYCIN HCL IN DEXTROSE 1-5 GM/200ML-% IV SOLN: 1000.0000 mg | INTRAVENOUS | Status: DC

## 2016-06-15 MED ORDER — FENTANYL CITRATE (PF) 100 MCG/2ML IJ SOLN
INTRAMUSCULAR | Status: AC | PRN
  Administered 2016-06-15: 50 ug via INTRAVENOUS

## 2016-06-15 MED ORDER — ACETAMINOPHEN 650 MG RE SUPP: 650.0000 mg | Freq: Four times a day (QID) | RECTAL | Status: DC | PRN

## 2016-06-15 MED ORDER — DIAZEPAM 5 MG PO TABS: 5.0000 mg | ORAL_TABLET | Freq: Every day | ORAL | Status: DC

## 2016-06-15 MED ORDER — LORAZEPAM 2 MG/ML IJ SOLN
INTRAMUSCULAR | Status: AC
  Filled 2016-06-15: qty 1

## 2016-06-15 MED ORDER — FENTANYL CITRATE (PF) 100 MCG/2ML IJ SOLN
12.5000 ug | INTRAMUSCULAR | Status: DC | PRN
  Administered 2016-06-15 (×3): 12.5 ug via INTRAVENOUS
  Filled 2016-06-15: qty 2

## 2016-06-15 MED ORDER — VANCOMYCIN HCL 500 MG IV SOLR
500.0000 mg | INTRAVENOUS | Status: DC
  Administered 2016-06-15: 500 mg via INTRAVENOUS
  Filled 2016-06-15: qty 500

## 2016-06-15 MED ORDER — MIRTAZAPINE 15 MG PO TBDP
15.0000 mg | ORAL_TABLET | Freq: Every day | ORAL | Status: DC
  Filled 2016-06-15: qty 1

## 2016-06-15 MED ORDER — PIPERACILLIN-TAZOBACTAM 3.375 G IVPB
3.3750 g | Freq: Three times a day (TID) | INTRAVENOUS | Status: DC
  Administered 2016-06-15: 3.375 g via INTRAVENOUS
  Filled 2016-06-15 (×2): qty 50

## 2016-06-15 MED ORDER — ORAL CARE MOUTH RINSE
15.0000 mL | Freq: Two times a day (BID) | OROMUCOSAL | Status: DC
  Administered 2016-06-15 – 2016-06-19 (×6): 15 mL via OROMUCOSAL

## 2016-06-15 MED ORDER — MIDAZOLAM HCL 2 MG/2ML IJ SOLN
INTRAMUSCULAR | Status: AC
  Filled 2016-06-15: qty 2

## 2016-06-15 MED ORDER — PIPERACILLIN-TAZOBACTAM 3.375 G IVPB 30 MIN: 3.3750 g | Freq: Once | INTRAVENOUS | Status: DC

## 2016-06-15 MED ORDER — LIDOCAINE HCL (PF) 1 % IJ SOLN
INTRAMUSCULAR | Status: AC | PRN
  Administered 2016-06-15: 5 mL

## 2016-06-15 NOTE — Progress Notes (Signed)
Pt woke up from a nap confused, and pulled out percutaneous cholecystostomy drain and PIV. Really restless and confused trying to get out from the bed. Given ordered prns, didn't do anything. Called MD, increased the doses on prn meds, after and hour he got comfortable with Ativan 1mg .  Contacted IR Dr.Watts and informed about the drain cath left in place, advised to clamp and tape it to the dressing as the patient is comfort care. Informed family.  Gershon Crane

## 2016-06-15 NOTE — Sedation Documentation (Signed)
Patient denies pain and is resting comfortably.  

## 2016-06-15 NOTE — Progress Notes (Signed)
CRITICAL VALUE ALERT  Critical value received:  Lactic acid- 8.3 & Troponin- 0.09  Date of notification: 06/15/16  Time of notification: 09:20  Critical value read back: Yes  Nurse who received alert:  Gershon Crane  MD notified (1st page): Dr.Grunz  Time of first page: 09:20

## 2016-06-15 NOTE — Progress Notes (Signed)
   [facility] PCCM Palliative care team involved with pt.  Now comfort care. PCCM will hold off seeing pt.   Monica Becton, MD 06/15/2016, 3:46 PM [facility] Pager [phone] After 3 pm or if no answer, call [phone]

## 2016-06-15 NOTE — Progress Notes (Signed)
RT note-Patient has been made pallitive/comfort care, Bipap was removed, however, patient very restless and short of breath, briefly placed on patient for comfort.

## 2016-06-15 NOTE — Progress Notes (Signed)
PHARMACY - PHYSICIAN COMMUNICATION CRITICAL VALUE ALERT - BLOOD CULTURE IDENTIFICATION (BCID)  Results for orders placed or performed during the hospital encounter of 06/04/2016  Blood Culture ID Panel (Reflexed) (Collected: 05/31/2016  7:49 PM)  Result Value Ref Range   Enterococcus species NOT DETECTED NOT DETECTED   Listeria monocytogenes NOT DETECTED NOT DETECTED   Staphylococcus species NOT DETECTED NOT DETECTED   Staphylococcus aureus NOT DETECTED NOT DETECTED   Streptococcus species NOT DETECTED NOT DETECTED   Streptococcus agalactiae NOT DETECTED NOT DETECTED   Streptococcus pneumoniae NOT DETECTED NOT DETECTED   Streptococcus pyogenes NOT DETECTED NOT DETECTED   Acinetobacter baumannii NOT DETECTED NOT DETECTED   Enterobacteriaceae species DETECTED (A) NOT DETECTED   Enterobacter cloacae complex NOT DETECTED NOT DETECTED   Escherichia coli DETECTED (A) NOT DETECTED   Klebsiella oxytoca NOT DETECTED NOT DETECTED   Klebsiella pneumoniae NOT DETECTED NOT DETECTED   Proteus species NOT DETECTED NOT DETECTED   Serratia marcescens NOT DETECTED NOT DETECTED   Carbapenem resistance NOT DETECTED NOT DETECTED   Haemophilus influenzae NOT DETECTED NOT DETECTED   Neisseria meningitidis NOT DETECTED NOT DETECTED   Pseudomonas aeruginosa NOT DETECTED NOT DETECTED   Candida albicans NOT DETECTED NOT DETECTED   Candida glabrata NOT DETECTED NOT DETECTED   Candida krusei NOT DETECTED NOT DETECTED   Candida parapsilosis NOT DETECTED NOT DETECTED   Candida tropicalis NOT DETECTED NOT DETECTED    Name of physician (or Provider) Contacted: Grunz  Changes to prescribed antibiotics required:  2/2 blood cultures positive with e.coli Due to patient's severity of illness and immunocompromised status, will leave vancomycin for now Will change zosyn to meropenem and plan to deescalate as soon as susceptibilities return   Harvel Quale 06/15/2016  9:51 AM

## 2016-06-15 NOTE — Progress Notes (Signed)
Pharmacy Antibiotic Note  [name] is a 81 y.o. male admitted on 06/02/2016 with SOB/weakness/sepsis.  Pharmacy has been consulted for Vancomycin and Zosyn dosing. Vancomycin 1 g IV given in ED at 2100  Plan: Vancomycin 1 g IV q48h Zosyn 3.375 g IV q8h   Height: 5\' 10"  (177.8 cm) Weight: 123 lb 0.3 oz (55.8 kg) IBW/kg (Calculated) : 73  Temp (24hrs), Avg:97.5 F (36.4 C), Min:97.5 F (36.4 C), Max:97.5 F (36.4 C)   Recent Labs Lab 06/13/16 1035 06/13/16 1035 06/12/2016 2003 06/05/2016 2004  WBC 7.4  --   --  12.9*  CREATININE  --  1.0  --  1.32*  LATICACIDVEN  --   --  10.09*  --     Estimated Creatinine Clearance: 36.4 mL/min (A) (by C-G formula based on SCr of 1.32 mg/dL (H)).    Allergies  Allergen Reactions  . Ace Inhibitors Swelling and Other (See Comments)    Swelling of lips - reaction to Benazepril  . Angiotensin Receptor Blockers Other (See Comments)    These are contraindicated because of angioedema with ACE inhibitors.  . Ciprofloxacin Anaphylaxis and Other (See Comments)    REACTION: anorexia , weakness, blurred vision    Caryl Pina 06/15/2016 3:35 AM

## 2016-06-15 NOTE — ED Notes (Signed)
Pt and family made aware of bed assignment 

## 2016-06-15 NOTE — Progress Notes (Signed)
Pt currently off BIPAP and on NRP. Pt states no real improvement of SOB. Pt appears comfortable and in no obvious distress. RN notified.

## 2016-06-15 NOTE — Consult Note (Signed)
Chief Complaint: acute cholecystitis  Referring Physician:Dr. Vance Gather  Supervising Physician: Corrie Mckusick  Patient Status: [facility] - In-pt  HPI: [name] is an 81 y.o. male who is well-known to the IR service for recent diagnosis of pancreatic cancer who had to have a stent placed secondary to obstruction of his CBD. This was placed on March 2.  He has had persistent pain in his RUQ since then and it has continued to progress.  IR saw him on March 8 for a biopsy of his liver mass to confirm metastatic disease.  He denies any fevers at home.  He has not had any nausea or vomiting, but decreased appetite.  He has had some diarrhea.  His pain has now become constant and due to dehydration and worsening weakness, he was brought to the ED.  He has been found to have E. Coli bacteremia secondary to acute cholecystitis.  General surgery was contacted by the primary service and deemed not a surgical candidate.  We have been consulted for percutaneous cholecystostomy drain placement.   Past Medical History:  Past Medical History:  Diagnosis Date  . Arthritis    R hip and shoulder  . Complication of anesthesia    Local anesthetic used in office for Prostate biopsy" reaction Tachycardia,nausea, hallucinations, Blood pressure elevated"- No problems once done at hospital with anesthesia" Thinks Ciprofloxacin was the injection"   . Diabetes mellitus   . Enlarged prostate   . Femoral bruit    R femoral  . GERD with stricture    PMH of  . History of kidney stones   . Hyperlipidemia   . Hypertension   . Macular degeneration    glaucoma suspect  . Pancreatic cancer ([place]) 09/2015   adenocarcinoma of pancreas  . PONV (postoperative nausea and vomiting)   . Prostate cancer [facility])    prostate ; Dr Wyvonnia Lora to be monitored x 5 yrs now.  . Squamous papilloma    of esophagus    Past Surgical History:  Past Surgical History:  Procedure Laterality Date  . ANKLE FUSION  2004  .  COLONOSCOPY  05/2012  . ERCP N/A 05/25/2016   Procedure: ENDOSCOPIC RETROGRADE CHOLANGIOPANCREATOGRAPHY (ERCP);  Surgeon: Ladene Artist, MD;  Location: [facility] ENDOSCOPY;  Service: Endoscopy;  Laterality: N/A;  . esophageal dilation  2002   Dr  Lucio Edward  . ESOPHAGOGASTRODUODENOSCOPY (EGD) WITH PROPOFOL N/A 09/30/2015   Procedure: ESOPHAGOGASTRODUODENOSCOPY (EGD) WITH PROPOFOL;  Surgeon: Milus Banister, MD;  Location: [facility] ENDOSCOPY;  Service: Endoscopy;  Laterality: N/A;  . EUS N/A 09/30/2015   Procedure: UPPER ENDOSCOPIC ULTRASOUND (EUS) RADIAL;  Surgeon: Milus Banister, MD;  Location: [facility] ENDOSCOPY;  Service: Endoscopy;  Laterality: N/A;  . FINE NEEDLE ASPIRATION N/A 09/30/2015   Procedure: FINE NEEDLE ASPIRATION (FNA) LINEAR;  Surgeon: Milus Banister, MD;  Location: [facility] ENDOSCOPY;  Service: Endoscopy;  Laterality: N/A;  . HERNIA REPAIR     bil inguinal hernias  . PARTIAL COLECTOMY     perforated diverticulitis  . PORTACATH PLACEMENT Left 10/13/2015   Procedure: INSERTION PORT-A-CATH;  Surgeon: [name], MD;  Location: [facility] ORS;  Service: General;  Laterality: Left;  . PROSTATE BIOPSY  03/29/2012   Procedure: BIOPSY TRANSRECTAL ULTRASONIC PROSTATE (TUBP);  Surgeon: Fredricka Bonine, MD;  Location: [facility];  Service: Urology;  Laterality: N/A;  . ROTATOR CUFF REPAIR  2006  . TOTAL HIP ARTHROPLASTY Left 2012    Family History:  Family History  Problem  Relation Age of Onset  . Kidney disease Father     ? Rheumatic fever as child  . Hypertension Father   . Heart attack Paternal Uncle     in 69s  . COPD Paternal Uncle     due to exposures in [facility]  . Kidney disease Paternal Aunt     ? Rheumatic fever as child  . Cancer Neg Hx   . Diabetes Neg Hx   . Stroke Neg Hx   . Colon cancer Neg Hx     Social History:  reports that he quit smoking about 27 years ago. His smoking use included Cigarettes. He has a 20.00 pack-year smoking history. He has never used smokeless  tobacco. He reports that he drinks alcohol. He reports that he does not use drugs.  Allergies:  Allergies  Allergen Reactions  . Ace Inhibitors Swelling and Other (See Comments)    Swelling of lips - reaction to Benazepril  . Angiotensin Receptor Blockers Other (See Comments)    These are contraindicated because of angioedema with ACE inhibitors.  . Ciprofloxacin Anaphylaxis and Other (See Comments)    REACTION: anorexia , weakness, blurred vision    Medications: Medications reviewed in epic.  Please see HPI for pertinent positives, otherwise complete 10 system ROS negative.  Mallampati Score: MD Evaluation Airway: WNL Heart: WNL Abdomen: Other (comments) Abdomen comments: has CBD stent in place and tender in RUQ Chest/ Lungs: Other (comments) Chest/ lungs comments: rhonchi, on 10-15L [place] sating in upper 80s ASA  Classification: 4 Mallampati/Airway Score: Three  Physical Exam: BP (!) 91/51   Pulse (!) 105   Temp (!) 96.8 F (36 C) (Axillary)   Resp (!) 32   Ht 5\' 10"  (1.778 m)   Wt 125 lb (56.7 kg)   SpO2 (!) 89%   BMI 17.94 kg/m  Body mass index is 17.94 kg/m. General: pleasant, ill-appearing black male who is laying in bed in NAD HEENT: head is normocephalic, atraumatic.  Sclera are noninjected.  PERRL.  Ears and nose without any masses or lesions, [place] in place with 10-15L O2 running.  Mouth is pink and dry Heart: regular rhythm, but tachy.  Normal s1,s2. No obvious murmurs, gallops, or rubs noted.  Palpable radial pulses bilaterally Lungs: diffuse rhonchi, but no wheezes or rales noted.  Respiratory effort slightly labored.  [place] in place Abd: soft, tender in RUQ, ND, +BS, no masses, hernias, or organomegaly Psych: A&Ox3 with an appropriate affect.   Labs: Results for orders placed or performed during the hospital encounter of 06/18/2016 (from the past 48 hour(s))  Culture, blood (Routine x 2)     Status: None (Preliminary result)   Collection Time: 06/22/2016  7:49 PM    Result Value Ref Range   Specimen Description BLOOD LEFT ANTECUBITAL    Special Requests BOTTLES DRAWN AEROBIC AND ANAEROBIC 7CC    Culture  Setup Time      GRAM NEGATIVE RODS IN BOTH AEROBIC AND ANAEROBIC BOTTLES Organism ID to follow CRITICAL RESULT CALLED TO, READ BACK BY AND VERIFIED WITH: A JOHNSON 06/15/16 @ 0856 M VESTAL    Culture GRAM NEGATIVE RODS    Report Status PENDING   Blood Culture ID Panel (Reflexed)     Status: Abnormal   Collection Time: 06/23/2016  7:49 PM  Result Value Ref Range   Enterococcus species NOT DETECTED NOT DETECTED   Listeria monocytogenes NOT DETECTED NOT DETECTED   Staphylococcus species NOT DETECTED NOT DETECTED   Staphylococcus aureus  NOT DETECTED NOT DETECTED   Streptococcus species NOT DETECTED NOT DETECTED   Streptococcus agalactiae NOT DETECTED NOT DETECTED   Streptococcus pneumoniae NOT DETECTED NOT DETECTED   Streptococcus pyogenes NOT DETECTED NOT DETECTED   Acinetobacter baumannii NOT DETECTED NOT DETECTED   Enterobacteriaceae species DETECTED (A) NOT DETECTED    Comment: Enterobacteriaceae represent a large family of gram-negative bacteria, not a single organism. CRITICAL RESULT CALLED TO, READ BACK BY AND VERIFIED WITH: A JOHNSON 06/15/16 @ 0856 M VESTAL    Enterobacter cloacae complex NOT DETECTED NOT DETECTED   Escherichia coli DETECTED (A) NOT DETECTED    Comment: CRITICAL RESULT CALLED TO, READ BACK BY AND VERIFIED WITH: A JOHNSON 06/15/16 @ 0856 M VESTAL    Klebsiella oxytoca NOT DETECTED NOT DETECTED   Klebsiella pneumoniae NOT DETECTED NOT DETECTED   Proteus species NOT DETECTED NOT DETECTED   Serratia marcescens NOT DETECTED NOT DETECTED   Carbapenem resistance NOT DETECTED NOT DETECTED   Haemophilus influenzae NOT DETECTED NOT DETECTED   Neisseria meningitidis NOT DETECTED NOT DETECTED   Pseudomonas aeruginosa NOT DETECTED NOT DETECTED   Candida albicans NOT DETECTED NOT DETECTED   Candida glabrata NOT DETECTED NOT  DETECTED   Candida krusei NOT DETECTED NOT DETECTED   Candida parapsilosis NOT DETECTED NOT DETECTED   Candida tropicalis NOT DETECTED NOT DETECTED  I-Stat Troponin, ED (not at [facility])     Status: None   Collection Time: 06/04/2016  8:01 PM  Result Value Ref Range   Troponin i, poc 0.01 0.00 - 0.08 ng/mL   Comment 3            Comment: Due to the release kinetics of cTnI, a negative result within the first hours of the onset of symptoms does not rule out myocardial infarction with certainty. If myocardial infarction is still suspected, repeat the test at appropriate intervals.   I-Stat CG4 Lactic Acid, ED     Status: Abnormal   Collection Time: 06/07/2016  8:03 PM  Result Value Ref Range   Lactic Acid, Venous 10.09 (HH) 0.5 - 1.9 mmol/L   Comment NOTIFIED PHYSICIAN   Comprehensive metabolic panel     Status: Abnormal   Collection Time: 06/18/2016  8:04 PM  Result Value Ref Range   Sodium 137 135 - 145 mmol/L   Potassium 4.2 3.5 - 5.1 mmol/L   Chloride 103 101 - 111 mmol/L   CO2 15 (L) 22 - 32 mmol/L   Glucose, Bld 171 (H) 65 - 99 mg/dL   BUN 21 (H) 6 - 20 mg/dL   Creatinine, Ser 1.32 (H) 0.61 - 1.24 mg/dL   Calcium 7.5 (L) 8.9 - 10.3 mg/dL   Total Protein 5.4 (L) 6.5 - 8.1 g/dL   Albumin 2.2 (L) 3.5 - 5.0 g/dL   AST <5 (L) 15 - 41 U/L    Comment: REPEATED TO VERIFY   ALT <5 (L) 17 - 63 U/L    Comment: REPEATED TO VERIFY   Alkaline Phosphatase 237 (H) 38 - 126 U/L   Total Bilirubin 4.6 (H) 0.3 - 1.2 mg/dL   GFR calc non Af Amer 50 (L) >60 mL/min   GFR calc Af Amer 58 (L) >60 mL/min    Comment: (NOTE) The eGFR has been calculated using the CKD EPI equation. This calculation has not been validated in all clinical situations. eGFR's persistently <60 mL/min signify possible Chronic Kidney Disease.    Anion gap 19 (H) 5 - 15  CBC with Differential  Status: Abnormal   Collection Time: 06/03/2016  8:04 PM  Result Value Ref Range   WBC 12.9 (H) 4.0 - 10.5 K/uL   RBC 3.87 (L) 4.22  - 5.81 MIL/uL   Hemoglobin 10.2 (L) 13.0 - 17.0 g/dL   HCT 31.4 (L) 39.0 - 52.0 %   MCV 81.1 78.0 - 100.0 fL   MCH 26.4 26.0 - 34.0 pg   MCHC 32.5 30.0 - 36.0 g/dL   RDW 16.3 (H) 11.5 - 15.5 %   Platelets 116 (L) 150 - 400 K/uL    Comment: REPEATED TO VERIFY SPECIMEN CHECKED FOR CLOTS PLATELET COUNT CONFIRMED BY SMEAR    Neutrophils Relative % 97 %   Lymphocytes Relative 1 %   Monocytes Relative 1 %   Eosinophils Relative 1 %   Basophils Relative 0 %   Neutro Abs 12.6 (H) 1.7 - 7.7 K/uL   Lymphs Abs 0.1 (L) 0.7 - 4.0 K/uL   Monocytes Absolute 0.1 0.1 - 1.0 K/uL   Eosinophils Absolute 0.1 0.0 - 0.7 K/uL   Basophils Absolute 0.0 0.0 - 0.1 K/uL   WBC Morphology MILD LEFT SHIFT (1-5% METAS, OCC MYELO, OCC BANDS)     Comment: HYPERSEGMENTED NEUT TOXIC GRANULATION   Protime-INR     Status: Abnormal   Collection Time: 06/22/2016  8:04 PM  Result Value Ref Range   Prothrombin Time 18.5 (H) 11.4 - 15.2 seconds   INR 1.53   Lipase, blood     Status: Abnormal   Collection Time: 06/13/2016  8:04 PM  Result Value Ref Range   Lipase <10 (L) 11 - 51 U/L    Comment: REPEATED TO VERIFY  Culture, blood (Routine x 2)     Status: None (Preliminary result)   Collection Time: 05/31/2016  8:59 PM  Result Value Ref Range   Specimen Description BLOOD RIGHT ANTECUBITAL    Special Requests IN PEDIATRIC BOTTLE 3CC    Culture  Setup Time      GRAM NEGATIVE RODS IN PEDIATRIC BOTTLE CRITICAL RESULT CALLED TO, READ BACK BY AND VERIFIED WITH: A JOHNSON 06/15/16 @ 0856 M VESTAL    Culture GRAM NEGATIVE RODS    Report Status PENDING   Urinalysis, Routine w reflex microscopic     Status: Abnormal   Collection Time: 06/13/2016 11:31 PM  Result Value Ref Range   Color, Urine AMBER (A) YELLOW    Comment: BIOCHEMICALS MAY BE AFFECTED BY COLOR   APPearance CLEAR CLEAR   Specific Gravity, Urine 1.038 (H) 1.005 - 1.030   pH 5.0 5.0 - 8.0   Glucose, UA NEGATIVE NEGATIVE mg/dL   Hgb urine dipstick NEGATIVE  NEGATIVE   Bilirubin Urine NEGATIVE NEGATIVE   Ketones, ur NEGATIVE NEGATIVE mg/dL   Protein, ur 30 (A) NEGATIVE mg/dL   Nitrite NEGATIVE NEGATIVE   Leukocytes, UA NEGATIVE NEGATIVE   RBC / HPF 0-5 0 - 5 RBC/hpf   WBC, UA 0-5 0 - 5 WBC/hpf   Bacteria, UA NONE SEEN NONE SEEN   Squamous Epithelial / LPF 0-5 (A) NONE SEEN   Mucous PRESENT    Hyaline Casts, UA PRESENT   CBC with Differential     Status: Abnormal   Collection Time: 06/15/16  3:25 AM  Result Value Ref Range   WBC 12.1 (H) 4.0 - 10.5 K/uL    Comment: WHITE COUNT CONFIRMED ON SMEAR   RBC 3.26 (L) 4.22 - 5.81 MIL/uL   Hemoglobin 8.7 (L) 13.0 - 17.0 g/dL   HCT 27.0 (  L) 39.0 - 52.0 %   MCV 82.8 78.0 - 100.0 fL   MCH 26.7 26.0 - 34.0 pg   MCHC 32.2 30.0 - 36.0 g/dL   RDW 17.3 (H) 11.5 - 15.5 %   Platelets 86 (L) 150 - 400 K/uL    Comment: PLATELET COUNT CONFIRMED BY SMEAR   Neutrophils Relative % 94 %   Lymphocytes Relative 2 %   Monocytes Relative 1 %   Eosinophils Relative 3 %   Basophils Relative 0 %   Neutro Abs 11.4 (H) 1.7 - 7.7 K/uL   Lymphs Abs 0.2 (L) 0.7 - 4.0 K/uL   Monocytes Absolute 0.1 0.1 - 1.0 K/uL   Eosinophils Absolute 0.4 0.0 - 0.7 K/uL   Basophils Absolute 0.0 0.0 - 0.1 K/uL   WBC Morphology MILD LEFT SHIFT (1-5% METAS, OCC MYELO, OCC BANDS)     Comment: VACUOLATED NEUTROPHILS  Comprehensive metabolic panel     Status: Abnormal   Collection Time: 06/15/16  3:25 AM  Result Value Ref Range   Sodium 135 135 - 145 mmol/L   Potassium 4.1 3.5 - 5.1 mmol/L   Chloride 104 101 - 111 mmol/L   CO2 11 (L) 22 - 32 mmol/L   Glucose, Bld 144 (H) 65 - 99 mg/dL   BUN 23 (H) 6 - 20 mg/dL   Creatinine, Ser 1.27 (H) 0.61 - 1.24 mg/dL   Calcium 7.2 (L) 8.9 - 10.3 mg/dL   Total Protein 5.5 (L) 6.5 - 8.1 g/dL   Albumin 2.1 (L) 3.5 - 5.0 g/dL   AST 208 (H) 15 - 41 U/L   ALT 156 (H) 17 - 63 U/L   Alkaline Phosphatase 234 (H) 38 - 126 U/L   Total Bilirubin 5.5 (H) 0.3 - 1.2 mg/dL   GFR calc non Af Amer 52 (L) >60  mL/min   GFR calc Af Amer >60 >60 mL/min    Comment: (NOTE) The eGFR has been calculated using the CKD EPI equation. This calculation has not been validated in all clinical situations. eGFR's persistently <60 mL/min signify possible Chronic Kidney Disease.    Anion gap 20 (H) 5 - 15  Procalcitonin     Status: None   Collection Time: 06/15/16  3:25 AM  Result Value Ref Range   Procalcitonin 65.05 ng/mL    Comment:        Interpretation: PCT >= 10 ng/mL: Important systemic inflammatory response, almost exclusively due to severe bacterial sepsis or septic shock. (NOTE)         ICU PCT Algorithm               Non ICU PCT Algorithm    ----------------------------     ------------------------------         PCT < 0.25 ng/mL                 PCT < 0.1 ng/mL     Stopping of antibiotics            Stopping of antibiotics       strongly encouraged.               strongly encouraged.    ----------------------------     ------------------------------       PCT level decrease by               PCT < 0.25 ng/mL       >= 80% from peak PCT       OR PCT 0.25 -  0.5 ng/mL          Stopping of antibiotics                                             encouraged.     Stopping of antibiotics           encouraged.    ----------------------------     ------------------------------       PCT level decrease by              PCT >= 0.25 ng/mL       < 80% from peak PCT        AND PCT >= 0.5 ng/mL             Continuing antibiotics                                              encouraged.       Continuing antibiotics            encouraged.    ----------------------------     ------------------------------     PCT level increase compared          PCT > 0.5 ng/mL         with peak PCT AND          PCT >= 0.5 ng/mL             Escalation of antibiotics                                          strongly encouraged.      Escalation of antibiotics        strongly encouraged.   Lactic acid, plasma     Status:  Abnormal   Collection Time: 06/15/16  3:50 AM  Result Value Ref Range   Lactic Acid, Venous 7.8 (HH) 0.5 - 1.9 mmol/L    Comment: CRITICAL RESULT CALLED TO, READ BACK BY AND VERIFIED WITH: H DADAMO,RN [id number] WILDERK   Protime-INR     Status: Abnormal   Collection Time: 06/15/16  3:50 AM  Result Value Ref Range   Prothrombin Time 18.2 (H) 11.4 - 15.2 seconds   INR 1.49   APTT     Status: Abnormal   Collection Time: 06/15/16  3:50 AM  Result Value Ref Range   aPTT 45 (H) 24 - 36 seconds    Comment:        IF BASELINE aPTT IS ELEVATED, SUGGEST PATIENT RISK ASSESSMENT BE USED TO DETERMINE APPROPRIATE ANTICOAGULANT THERAPY.   Glucose, capillary     Status: Abnormal   Collection Time: 06/15/16  4:50 AM  Result Value Ref Range   Glucose-Capillary 138 (H) 65 - 99 mg/dL   Comment 1 Notify RN    Comment 2 Document in Chart   MRSA PCR Screening     Status: None   Collection Time: 06/15/16  5:00 AM  Result Value Ref Range   MRSA by PCR NEGATIVE NEGATIVE    Comment:        The GeneXpert MRSA Assay (FDA approved for NASAL specimens only), is one component of a comprehensive MRSA  colonization surveillance program. It is not intended to diagnose MRSA infection nor to guide or monitor treatment for MRSA infections.   Lactic acid, plasma     Status: Abnormal   Collection Time: 06/15/16  7:55 AM  Result Value Ref Range   Lactic Acid, Venous 8.3 (HH) 0.5 - 1.9 mmol/L    Comment: CRITICAL RESULT CALLED TO, READ BACK BY AND VERIFIED WITH: Montey Hora [id number] WILDERK   Troponin I (q 6hr x 3)     Status: Abnormal   Collection Time: 06/15/16  7:55 AM  Result Value Ref Range   Troponin I 0.09 (HH) <0.03 ng/mL    Comment: CRITICAL RESULT CALLED TO, READ BACK BY AND VERIFIED WITHMontey Hora [id number] [facility]     Imaging: Dg Chest 2 View  Result Date: 06/05/2016 CLINICAL DATA:  Chest tightness EXAM: CHEST  2 VIEW COMPARISON:  05/25/16 FINDINGS: There is a left chest wall  port a catheter with tip in the cavoatrial junction. Heart size appears normal. No pleural effusion or edema. No airspace opacities. The visualized osseous structures are unremarkable. IMPRESSION: No active cardiopulmonary disease. Electronically Signed   By: Kerby Moors M.D.   On: 06/06/2016 20:36   Ct Angio Chest Pe W And/or Wo Contrast  Result Date: 05/29/2016 CLINICAL DATA:  Acute onset of shortness of breath and chest tightness. Generalized abdominal pain. Loose stools. Current history of pancreatic cancer, on chemotherapy. Initial encounter. EXAM: CT ANGIOGRAPHY CHEST CT ABDOMEN AND PELVIS WITH CONTRAST TECHNIQUE: Multidetector CT imaging of the chest was performed using the standard protocol during bolus administration of intravenous contrast. Multiplanar CT image reconstructions and MIPs were obtained to evaluate the vascular anatomy. Multidetector CT imaging of the abdomen and pelvis was performed using the standard protocol during bolus administration of intravenous contrast. CONTRAST:  100 mL of Isovue 370 IV contrast COMPARISON:  CT of the chest, abdomen and pelvis performed 05/19/2016 FINDINGS: CTA CHEST FINDINGS Cardiovascular:  There is no evidence of pulmonary embolus. The heart is borderline normal in size. Scattered calcification is noted along the aortic arch and descending thoracic aorta. The great vessels are grossly unremarkable in appearance, though difficult to fully assess due to artifact from contrast and beam hardening artifact. Mediastinum/Nodes: The mediastinum is grossly unremarkable in appearance. No mediastinal lymphadenopathy is seen. Trace pericardial fluid remains within normal limits. The thyroid gland is not well characterized. No axillary lymphadenopathy is seen. A left-sided chest port is noted ending about the distal SVC. 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 Lungs/Pleura:  Patchy bibasilar airspace opacities raise concern for mild infection. Mild fibrotic change and emphysematous change are noted bilaterally. No pleural effusion or pneumothorax is seen. No dominant mass is identified. Musculoskeletal: No acute osseous abnormalities are identified. Mild degenerative change is noted at the lower cervical spine. The visualized musculature is unremarkable in appearance. Review of the MIP images confirms the above findings. CT ABDOMEN and PELVIS FINDINGS Hepatobiliary: Diffuse periportal edema is noted within the liver. A common bile duct stent is seen in expected position. No definite intrahepatic biliary ductal dilatation is seen. There is significant gallbladder wall thickening, with marked distention of the gallbladder, concerning for acute cholecystitis. Trace ascites is seen tracking about the liver. Pancreas: The known pancreatic mass is difficult to fully characterize, with vague soft tissue tracking about the portal venous system and body of the pancreas. There is increasing wall thickening involving  the antrum of the stomach. Underlying infiltration of mass cannot be excluded. Vague soft tissue density is seen tracking about the mesentery and underlying retroperitoneum more inferiorly. There is near complete effacement of the superior mesenteric vein, new from the prior study and raising suspicion for compression by the underlying mass. Spleen: The spleen is unremarkable in appearance. Adrenals/Urinary Tract: The adrenal glands are unremarkable in appearance. There is mild-to-moderate right-sided hydronephrosis, raising suspicion for ureteral obstruction at the lower abdomen, likely reflecting underlying diffuse infiltrative mass. This is also new from the prior study. Scattered bilateral renal cysts are seen. Nonspecific perinephric stranding is noted bilaterally. No obstructing ureteral stones are identified. Stomach/Bowel: There is swirling of the superior mesenteric artery  and vein, concerning for some degree of twisting of the small bowel about the mesenteric axis, without evidence for bowel ischemia at this time. As described above, there is near complete effacement of the superior mesenteric vein due to surrounding mass. Additional scattered peritoneal metastases are seen tracking about the omentum at the left lower quadrant, and along the paracolic gutters bilaterally, suspicious for worsening diffuse peritoneal carcinomatosis. The stomach is mildly distended with fluid and air. Vague wall thickening is noted along the ascending colon, of uncertain significance. Scattered diverticulosis is seen along the proximal sigmoid colon. Postoperative change is noted at the right lower quadrant and at the sigmoid colon. Vascular/Lymphatic: Diffuse calcification is seen along the abdominal aorta and its branches. The abdominal aorta is otherwise grossly unremarkable. The inferior vena cava is grossly unremarkable. No retroperitoneal lymphadenopathy is seen. No pelvic sidewall lymphadenopathy is identified. Reproductive: The bladder is relatively decompressed and not well assessed. The prostate is enlarged, measuring perhaps 6.1 cm in transverse dimension. Other: No additional soft tissue abnormalities are seen. Musculoskeletal: No acute osseous abnormalities are identified. The patient left hip arthroplasty is grossly unremarkable in appearance. Degenerative change is noted at the right hip. There is chronic osseous fusion at L4-L5. Multilevel vacuum phenomenon is seen along the lumbar spine. The visualized musculature is unremarkable in appearance. Review of the MIP images confirms the above findings. IMPRESSION: 1. No evidence of pulmonary embolus. 2. Significant gallbladder wall thickening, with marked distention of the gallbladder, concerning for acute cholecystitis, new from the prior study. 3. Patchy bibasilar airspace opacities raise concern for mild infection. Mild fibrotic change  and emphysema noted bilaterally. 4. Known pancreatic mass is difficult to fully characterize, with vague soft tissue tracking about the portal venous system and body of the pancreas. Increasing wall thickening involving the antrum of the stomach; underlying infiltrative mass cannot be excluded. 5. Vague soft tissue density tracks about the mesentery and more inferior retroperitoneum, worsened from the prior study, with new near complete effacement of the superior mesenteric vein, raising suspicion for compression by the underlying mass. 6. Mild-to-moderate right-sided hydronephrosis raises concern for partial right ureteral obstruction at the lower abdomen due to the underlying infiltrating mass. 7. Swirling of the superior mesenteric artery and vein, concerning for some degree of twisting of small bowel about the vague mass along the mesenteric axis, without evidence for bowel ischemia. 8. Additional scattered peritoneal masses tracking about the omentum at the left lower quadrant, and at the paracolic gutters bilaterally, suspicious for worsening diffuse peritoneal carcinomatosis. 9. Vague wall thickening along the ascending colon, of uncertain significance. Underlying infiltrative mass cannot be excluded. 10. Diffuse periportal edema noted. Common bile duct stent noted in expected position. Trace ascites noted about the liver. 11. 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 Associated diffuse filling of collateral vessels about the chest wall and neck. 12. Mild degenerative change at the lower cervical spine. Chronic osseous fusion at L4-L5, and degenerative change along the lumbar spine. Degenerative change at the right hip. 13. Diffuse aortic atherosclerosis. 14. Scattered diverticulosis along the proximal sigmoid colon, without evidence of diverticulitis. 15. Enlarged prostate noted. These results were called by telephone at the time of interpretation on 05/30/2016  at 11:08 pm to Dr. Brantley Stage, who verbally acknowledged these results. Electronically Signed   By: Garald Balding M.D.   On: 06/10/2016 23:10   Ct Abdomen Pelvis W Contrast  Result Date: 06/03/2016 CLINICAL DATA:  Acute onset of shortness of breath and chest tightness. Generalized abdominal pain. Loose stools. Current history of pancreatic cancer, on chemotherapy. Initial encounter. EXAM: CT ANGIOGRAPHY CHEST CT ABDOMEN AND PELVIS WITH CONTRAST TECHNIQUE: Multidetector CT imaging of the chest was performed using the standard protocol during bolus administration of intravenous contrast. Multiplanar CT image reconstructions and MIPs were obtained to evaluate the vascular anatomy. Multidetector CT imaging of the abdomen and pelvis was performed using the standard protocol during bolus administration of intravenous contrast. CONTRAST:  100 mL of Isovue 370 IV contrast COMPARISON:  CT of the chest, abdomen and pelvis performed 05/19/2016 FINDINGS: CTA CHEST FINDINGS Cardiovascular:  There is no evidence of pulmonary embolus. The heart is borderline normal in size. Scattered calcification is noted along the aortic arch and descending thoracic aorta. The great vessels are grossly unremarkable in appearance, though difficult to fully assess due to artifact from contrast and beam hardening artifact. Mediastinum/Nodes: The mediastinum is grossly unremarkable in appearance. No mediastinal lymphadenopathy is seen. Trace pericardial fluid remains within normal limits. The thyroid gland is not well characterized. No axillary lymphadenopathy is seen. A left-sided chest port is noted ending about the distal SVC. 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 Lungs/Pleura: Patchy bibasilar airspace opacities raise concern for mild infection. Mild fibrotic change and emphysematous change are noted bilaterally. No pleural effusion or  pneumothorax is seen. No dominant mass is identified. Musculoskeletal: No acute osseous abnormalities are identified. Mild degenerative change is noted at the lower cervical spine. The visualized musculature is unremarkable in appearance. Review of the MIP images confirms the above findings. CT ABDOMEN and PELVIS FINDINGS Hepatobiliary: Diffuse periportal edema is noted within the liver. A common bile duct stent is seen in expected position. No definite intrahepatic biliary ductal dilatation is seen. There is significant gallbladder wall thickening, with marked distention of the gallbladder, concerning for acute cholecystitis. Trace ascites is seen tracking about the liver. Pancreas: The known pancreatic mass is difficult to fully characterize, with vague soft tissue tracking about the portal venous system and body of the pancreas. There is increasing wall thickening involving the antrum of the stomach. Underlying infiltration of mass cannot be excluded. Vague soft tissue density is seen tracking about the mesentery and underlying retroperitoneum more inferiorly. There is near complete effacement of the superior mesenteric vein, new from the prior study and raising suspicion for compression by the underlying mass. Spleen: The spleen is unremarkable in appearance. Adrenals/Urinary Tract: The adrenal glands are unremarkable in appearance. There is mild-to-moderate right-sided hydronephrosis, raising suspicion for ureteral obstruction at the lower abdomen, likely reflecting underlying diffuse infiltrative mass. This is also new from the prior study. Scattered bilateral renal cysts are seen. Nonspecific perinephric stranding is noted bilaterally.  No obstructing ureteral stones are identified. Stomach/Bowel: There is swirling of the superior mesenteric artery and vein, concerning for some degree of twisting of the small bowel about the mesenteric axis, without evidence for bowel ischemia at this time. As described above,  there is near complete effacement of the superior mesenteric vein due to surrounding mass. Additional scattered peritoneal metastases are seen tracking about the omentum at the left lower quadrant, and along the paracolic gutters bilaterally, suspicious for worsening diffuse peritoneal carcinomatosis. The stomach is mildly distended with fluid and air. Vague wall thickening is noted along the ascending colon, of uncertain significance. Scattered diverticulosis is seen along the proximal sigmoid colon. Postoperative change is noted at the right lower quadrant and at the sigmoid colon. Vascular/Lymphatic: Diffuse calcification is seen along the abdominal aorta and its branches. The abdominal aorta is otherwise grossly unremarkable. The inferior vena cava is grossly unremarkable. No retroperitoneal lymphadenopathy is seen. No pelvic sidewall lymphadenopathy is identified. Reproductive: The bladder is relatively decompressed and not well assessed. The prostate is enlarged, measuring perhaps 6.1 cm in transverse dimension. Other: No additional soft tissue abnormalities are seen. Musculoskeletal: No acute osseous abnormalities are identified. The patient left hip arthroplasty is grossly unremarkable in appearance. Degenerative change is noted at the right hip. There is chronic osseous fusion at L4-L5. Multilevel vacuum phenomenon is seen along the lumbar spine. The visualized musculature is unremarkable in appearance. Review of the MIP images confirms the above findings. IMPRESSION: 1. No evidence of pulmonary embolus. 2. Significant gallbladder wall thickening, with marked distention of the gallbladder, concerning for acute cholecystitis, new from the prior study. 3. Patchy bibasilar airspace opacities raise concern for mild infection. Mild fibrotic change and emphysema noted bilaterally. 4. Known pancreatic mass is difficult to fully characterize, with vague soft tissue tracking about the portal venous system and body  of the pancreas. Increasing wall thickening involving the antrum of the stomach; underlying infiltrative mass cannot be excluded. 5. Vague soft tissue density tracks about the mesentery and more inferior retroperitoneum, worsened from the prior study, with new near complete effacement of the superior mesenteric vein, raising suspicion for compression by the underlying mass. 6. Mild-to-moderate right-sided hydronephrosis raises concern for partial right ureteral obstruction at the lower abdomen due to the underlying infiltrating mass. 7. Swirling of the superior mesenteric artery and vein, concerning for some degree of twisting of small bowel about the vague mass along the mesenteric axis, without evidence for bowel ischemia. 8. Additional scattered peritoneal masses tracking about the omentum at the left lower quadrant, and at the paracolic gutters bilaterally, suspicious for worsening diffuse peritoneal carcinomatosis. 9. Vague wall thickening along the ascending colon, of uncertain significance. Underlying infiltrative mass cannot be excluded. 10. Diffuse periportal edema noted. Common bile duct stent noted in expected position. Trace ascites noted about the liver. 11. 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 Associated diffuse filling of collateral vessels about the chest wall and neck. 12. Mild degenerative change at the lower cervical spine. Chronic osseous fusion at L4-L5, and degenerative change along the lumbar spine. Degenerative change at the right hip. 13. Diffuse aortic atherosclerosis. 14. Scattered diverticulosis along the proximal sigmoid colon, without evidence of diverticulitis. 15. Enlarged prostate noted. These results were called by telephone at the time of interpretation on 06/01/2016 at 11:08 pm to Dr. Crista Curb, who verbally acknowledged these results. Electronically Signed   By: Roanna Raider M.D.   On: 05/31/2016 23:10   Dg Chest  Portable 1  View  Result Date: 06/20/2016 CLINICAL DATA:  Acute onset of generalized weakness and shortness of breath. Generalized chest tightness and dry mouth. Initial encounter. EXAM: PORTABLE CHEST 1 VIEW COMPARISON:  Chest radiograph and CTA of the chest performed earlier today at 10:14 p.m. FINDINGS: The lungs are well-aerated. Worsening patchy bilateral airspace opacification is noted. This raises suspicion for pulmonary edema, given interval change since the prior CTA, and worsening interstitial prominence. No pleural effusion or pneumothorax is seen. The cardiomediastinal silhouette is normal in size. No acute osseous abnormalities are identified. A left-sided chest port is noted ending about the distal SVC. IMPRESSION: Worsening patchy bilateral airspace opacification is suspicious for pulmonary edema. Pneumonia is considered less likely but remains a possibility. Electronically Signed   By: Garald Balding M.D.   On: 05/26/2016 23:38    Assessment/Plan 1. Acute cholecystitis in the setting of metastatic pancreatic cancer -General surgery has been contacted by the primary service.  The patient has been deemed not a surgical candidate.  We will proceed with perc chole drain placement as requested.  The patient and family have been thoroughly informed that this drain will almost definitely be a life-long drain for him.  I have discussed with them how the drain works.  I drew pictures for them so they understood this process as well.  I discussed expected outcomes and follow up care.  They all understood and agreed to move forward.  His labs and vitals have been reviewed.  He is septic with respiratory failure, hypotension, etc.  I have discussed that we like to sedate patients for this procedure, but it is possible he may not be able to get much sedation or any sedation for this given his BP and respiratory status.  Since seeing the patient he has been placed on BiPap.    The patient understands all of this and  still agrees to proceed -Risks and Benefits discussed with the patient including, but not limited to bleeding, infection, gallbladder perforation, bile leak, sepsis or even death. All of the patient's questions were answered, patient is agreeable to proceed. Consent signed and in chart.  Thank you for this interesting consult.  I greatly enjoyed meeting [name] and look forward to participating in their care.  A copy of this report was sent to the requesting provider on this date.  Electronically Signed: Henreitta Cea 06/15/2016, 10:36 AM   I spent a total of 40 Minutes    in face to face in clinical consultation, greater than 50% of which was counseling/coordinating care for acute cholecystitis

## 2016-06-15 NOTE — H&P (Signed)
History and Physical    [name] EHU:[id number] DOB: 01-22-1936 DOA: 06/17/2016  PCP: Binnie Rail, MD  Patient coming from: Home.  Chief Complaint: Fever and chills and diarrhea.  HPI: [name] is a 81 y.o. male with history of pancreatic cancer and obstructive jaundice status post recent stent placement 3 weeks ago and on chemotherapy last chemotherapy was 2 days ago with history of diabetes mellitus, untreated prostate cancer and hypertension was brought to the ER patient started having persistent fever and chills with abdominal pain and diarrhea. Patient had chemotherapy 2 days ago and did well. Since yesterday morning patient was I'm fever chills and at least 4 episodes of loose stools. No blood in it. Denies any vomiting. Has constant epigastric and right upper quadrant pain.   ED Course: In the ER patient was found to be febrile and tachycardic and hypotensive with lactate of 10. Patient is a DO NOT RESUSCITATE. He was given fluid bolus had CT of the abdomen and pelvis and angiogram of the chest. Shows features concerning for cholecystitis and possible pneumonia with worsening pancreatic mass. On-call general surgeon Dr. Hulen Skains was consulted. Dr. Hulen Skains for that patient is not a surgical candidate for cholecystitis and a consult IR for drain placement. Patient eventually started developing shortness of breath and repeat chest x-ray shows pulmonary edema and patient was placed on CPAP.  Review of Systems: As per HPI, rest all negative.   Past Medical History:  Diagnosis Date  . Arthritis    R hip and shoulder  . Complication of anesthesia    Local anesthetic used in office for Prostate biopsy" reaction Tachycardia,nausea, hallucinations, Blood pressure elevated"- No problems once done at hospital with anesthesia" Thinks Ciprofloxacin was the injection"   . Diabetes mellitus   . Enlarged prostate   . Femoral bruit    R femoral  . GERD with stricture    PMH of  . History  of kidney stones   . Hyperlipidemia   . Hypertension   . Macular degeneration    glaucoma suspect  . Pancreatic cancer ([place]) 09/2015   adenocarcinoma of pancreas  . PONV (postoperative nausea and vomiting)   . Prostate cancer [facility])    prostate ; Dr Wyvonnia Lora to be monitored x 5 yrs now.  . Squamous papilloma    of esophagus    Past Surgical History:  Procedure Laterality Date  . ANKLE FUSION  2004  . COLONOSCOPY  05/2012  . ERCP N/A 05/25/2016   Procedure: ENDOSCOPIC RETROGRADE CHOLANGIOPANCREATOGRAPHY (ERCP);  Surgeon: Ladene Artist, MD;  Location[facility] ENDOSCOPY;  Service: Endoscopy;  Laterality: N/A;  . esophageal dilation  2002   Dr  Lucio Edward  . ESOPHAGOGASTRODUODENOSCOPY (EGD) WITH PROPOFOL N/A 09/30/2015   Procedure: ESOPHAGOGASTRODUODENOSCOPY (EGD) WITH PROPOFOL;  Surgeon: Milus Banister, MD;  Location: [facility] ENDOSCOPY;  Service: Endoscopy;  Laterality: N/A;  . EUS N/A 09/30/2015   Procedure: UPPER ENDOSCOPIC ULTRASOUND (EUS) RADIAL;  Surgeon: Milus Banister, MD;  Location: [facility] ENDOSCOPY;  Service: Endoscopy;  Laterality: N/A;  . FINE NEEDLE ASPIRATION N/A 09/30/2015   Procedure: FINE NEEDLE ASPIRATION (FNA) LINEAR;  Surgeon: Milus Banister, MD;  Location: [facility] ENDOSCOPY;  Service: Endoscopy;  Laterality: N/A;  . HERNIA REPAIR     bil inguinal hernias  . PARTIAL COLECTOMY     perforated diverticulitis  . PORTACATH PLACEMENT Left 10/13/2015   Procedure: INSERTION PORT-A-CATH;  Surgeon: [name], MD;  Location: [facility] ORS;  Service: General;  Laterality: Left;  . PROSTATE BIOPSY  03/29/2012   Procedure: BIOPSY TRANSRECTAL ULTRASONIC PROSTATE (TUBP);  Surgeon: Fredricka Bonine, MD;  Location: [facility];  Service: Urology;  Laterality: N/A;  . ROTATOR CUFF REPAIR  2006  . TOTAL HIP ARTHROPLASTY Left 2012     reports that he quit smoking about 27 years ago. His smoking use included Cigarettes. He has a 20.00 pack-year smoking history. He has never used  smokeless tobacco. He reports that he drinks alcohol. He reports that he does not use drugs.  Allergies  Allergen Reactions  . Ace Inhibitors Swelling and Other (See Comments)    Swelling of lips - reaction to Benazepril  . Angiotensin Receptor Blockers Other (See Comments)    These are contraindicated because of angioedema with ACE inhibitors.  . Ciprofloxacin Anaphylaxis and Other (See Comments)    REACTION: anorexia , weakness, blurred vision    Family History  Problem Relation Age of Onset  . Kidney disease Father     ? Rheumatic fever as child  . Hypertension Father   . Heart attack Paternal Uncle     in 78s  . COPD Paternal Uncle     due to exposures in [facility]  . Kidney disease Paternal Aunt     ? Rheumatic fever as child  . Cancer Neg Hx   . Diabetes Neg Hx   . Stroke Neg Hx   . Colon cancer Neg Hx     Prior to Admission medications   Medication Sig Start Date End Date Taking? Authorizing Provider  amLODipine (NORVASC) 10 MG tablet Take 1 tablet (10 mg total) by mouth daily. 11/01/15  Yes Binnie Rail, MD  aspirin 81 MG tablet Take 1 tablet (81 mg total) by mouth daily. 03/31/12  Yes Festus Aloe, MD  doxazosin (CARDURA) 1 MG tablet TAKE 1 TABLET BY MOUTH EVERY DAY 09/30/15  Yes Binnie Rail, MD  latanoprost (XALATAN) 0.005 % ophthalmic solution Place 1 drop into both eyes at bedtime. 05/25/15  Yes Historical Provider, MD  lidocaine-prilocaine (EMLA) cream Apply 1 application topically as needed. Apply to pac site 1 hour prior to stick and cover with plastic wrap Patient taking differently: Apply 1 application topically See admin instructions. Apply to pac site 1 hour prior to stick and cover with plastic wrap - approximately once a month 10/12/15  Yes Truitt Merle, MD  loperamide (IMODIUM) 2 MG capsule Take 4 mg by mouth as needed for diarrhea or loose stools.   Yes Historical Provider, MD  metFORMIN (GLUCOPHAGE) 500 MG tablet Take 1 tablet (500 mg total) by mouth 2 (two) times  daily with a meal. 10/07/15  Yes Binnie Rail, MD  multivitamin-lutein [facility]) CAPS Take 1 capsule by mouth 2 (two) times daily.    Yes Historical Provider, MD  naproxen sodium (ALEVE) 220 MG tablet Take 220 mg by mouth daily as needed (pain/headache).   Yes Historical Provider, MD  OVER THE COUNTER MEDICATION Place 1 drop into both eyes daily. Over the counter lubricating eye drops   Yes Historical Provider, MD  oxyCODONE (OXY IR/ROXICODONE) 5 MG immediate release tablet 1-2 tabs PO Q 6 hours PRN pain Patient taking differently: Take 5-10 mg by mouth every 6 (six) hours as needed for moderate pain.  06/05/16  Yes Susanne Borders, NP  potassium chloride 20 MEQ/15ML (10%) SOLN Take 15 mLs (20 mEq total) by mouth daily. 05/26/16  Yes Dron Tanna Furry, MD  pravastatin (PRAVACHOL) 20  MG tablet Take 1 tablet (20 mg total) by mouth at bedtime. 11/01/15  Yes Binnie Rail, MD  hydrochlorothiazide (MICROZIDE) 12.5 MG capsule Take 1 capsule (12.5 mg total) by mouth daily. Patient not taking: Reported on 06/15/2016 10/11/15   Binnie Rail, MD  HYDROcodone-acetaminophen (NORCO) 5-325 MG tablet Take 1 tablet by mouth every 6 (six) hours as needed for moderate pain. Patient not taking: Reported on 06/05/2016 05/26/16   Dron Tanna Furry, MD  [facility] DELICA LANCETS 08Q MISC Use to check blood sugars once a day Dx E11.9 Patient not taking: Reported on 06/10/2016 09/27/15   Binnie Rail, MD  ONETOUCH VERIO test strip CHECK BLOOD SUGAR ONCE DAILY. DX250.00 Patient not taking: Reported on 05/28/2016 11/23/14   Hendricks Limes, MD  traMADol (ULTRAM) 50 MG tablet Take 0.5 tablets (25 mg total) by mouth every 6 (six) hours as needed. Patient not taking: Reported on 06/11/2016 05/23/16   Truitt Merle, MD    Physical Exam: Vitals:   06/18/2016 2300 06/18/2016 2339 06/15/16 0000 06/15/16 0022  BP: 100/61 108/71  93/61  Pulse: (!) 103 (!) 109 (!) 107 (!) 109  Resp: (!) 27 (!) 24 (!) 24 (!) 22  Temp:      TempSrc:        SpO2: (!) 85% 100% 99% 99%  Weight:      Height:          Constitutional: Moderately built and poorly nourished. Vitals:   05/30/2016 2300 06/01/2016 2339 06/15/16 0000 06/15/16 0022  BP: 100/61 108/71  93/61  Pulse: (!) 103 (!) 109 (!) 107 (!) 109  Resp: (!) 27 (!) 24 (!) 24 (!) 22  Temp:      TempSrc:      SpO2: (!) 85% 100% 99% 99%  Weight:      Height:       Eyes: Anicteric no pallor. ENMT: No discharge from the ears eyes nose or mouth. Neck: No mass felt. No JVD appreciated. Respiratory: No rhonchi or crepitations. Cardiovascular: S1-S2 heard no murmurs appreciated. Abdomen: Epigastric and right upper quadrant tenderness. No guarding or rigidity. Bowel sounds present. Musculoskeletal: No edema. No joint effusion. Skin: No rash. Skin appears warm. Neurologic: Alert awake oriented to time place and person. Moves all extremities. Psychiatric: Appears normal. Normal affect.   Labs on Admission: I have personally reviewed following labs and imaging studies  CBC:  Recent Labs Lab 06/13/16 1035 06/09/2016 2004  WBC 7.4 12.9*  NEUTROABS 6.0 12.6*  HGB 10.6* 10.2*  HCT 31.7* 31.4*  MCV 81.8 81.1  PLT 256 761*   Basic Metabolic Panel:  Recent Labs Lab 06/13/16 1035 06/04/2016 2004  NA 138 137  K 3.9 4.2  CL  --  103  CO2 26 15*  GLUCOSE 235* 171*  BUN 17.5 21*  CREATININE 1.0 1.32*  CALCIUM 9.1 7.5*   GFR: Estimated Creatinine Clearance: 36.4 mL/min (A) (by C-G formula based on SCr of 1.32 mg/dL (H)). Liver Function Tests:  Recent Labs Lab 06/13/16 0946 06/13/16 1035 06/09/2016 2004  AST 41* 45* <5*  ALT 55* 64* <5*  ALKPHOS 148* 165* 237*  BILITOT 2.7* 2.62* 4.6*  PROT 6.7 6.8 5.4*  ALBUMIN 3.3* 2.6* 2.2*    Recent Labs Lab 06/13/2016 2004  LIPASE <10*   No results for input(s): AMMONIA in the last 168 hours. Coagulation Profile:  Recent Labs Lab 06/16/2016 2004  INR 1.53   Cardiac Enzymes: No results for input(s): CKTOTAL, CKMB, CKMBINDEX,  TROPONINI  in the last 168 hours. BNP (last 3 results) No results for input(s): PROBNP in the last 8760 hours. HbA1C: No results for input(s): HGBA1C in the last 72 hours. CBG: No results for input(s): GLUCAP in the last 168 hours. Lipid Profile: No results for input(s): CHOL, HDL, LDLCALC, TRIG, CHOLHDL, LDLDIRECT in the last 72 hours. Thyroid Function Tests: No results for input(s): TSH, T4TOTAL, FREET4, T3FREE, THYROIDAB in the last 72 hours. Anemia Panel: No results for input(s): VITAMINB12, FOLATE, FERRITIN, TIBC, IRON, RETICCTPCT in the last 72 hours. Urine analysis:    Component Value Date/Time   COLORURINE AMBER (A) 06/23/2016 2331   APPEARANCEUR CLEAR 06/17/2016 2331   LABSPEC 1.038 (H) 06/08/2016 2331   PHURINE 5.0 06/23/2016 2331   GLUCOSEU NEGATIVE 06/18/2016 2331   GLUCOSEU NEGATIVE 09/07/2015 1513   HGBUR NEGATIVE 06/02/2016 2331   BILIRUBINUR NEGATIVE 05/26/2016 2331   KETONESUR NEGATIVE 06/08/2016 2331   PROTEINUR 30 (A) 06/09/2016 2331   UROBILINOGEN 0.2 09/07/2015 1513   NITRITE NEGATIVE 05/28/2016 2331   LEUKOCYTESUR NEGATIVE 05/31/2016 2331   Sepsis Labs: @LABRCNTIP (procalcitonin:4,lacticidven:4) )No results found for this or any previous visit (from the past 240 hour(s)).   Radiological Exams on Admission: Dg Chest 2 View  Result Date: 06/23/2016 CLINICAL DATA:  Chest tightness EXAM: CHEST  2 VIEW COMPARISON:  05/25/16 FINDINGS: There is a left chest wall port a catheter with tip in the cavoatrial junction. Heart size appears normal. No pleural effusion or edema. No airspace opacities. The visualized osseous structures are unremarkable. IMPRESSION: No active cardiopulmonary disease. Electronically Signed   By: Kerby Moors M.D.   On: 06/03/2016 20:36   Ct Angio Chest Pe W And/or Wo Contrast  Result Date: 06/01/2016 CLINICAL DATA:  Acute onset of shortness of breath and chest tightness. Generalized abdominal pain. Loose stools. Current history of pancreatic  cancer, on chemotherapy. Initial encounter. EXAM: CT ANGIOGRAPHY CHEST CT ABDOMEN AND PELVIS WITH CONTRAST TECHNIQUE: Multidetector CT imaging of the chest was performed using the standard protocol during bolus administration of intravenous contrast. Multiplanar CT image reconstructions and MIPs were obtained to evaluate the vascular anatomy. Multidetector CT imaging of the abdomen and pelvis was performed using the standard protocol during bolus administration of intravenous contrast. CONTRAST:  100 mL of Isovue 370 IV contrast COMPARISON:  CT of the chest, abdomen and pelvis performed 05/19/2016 FINDINGS: CTA CHEST FINDINGS Cardiovascular:  There is no evidence of pulmonary embolus. The heart is borderline normal in size. Scattered calcification is noted along the aortic arch and descending thoracic aorta. The great vessels are grossly unremarkable in appearance, though difficult to fully assess due to artifact from contrast and beam hardening artifact. Mediastinum/Nodes: The mediastinum is grossly unremarkable in appearance. No mediastinal lymphadenopathy is seen. Trace pericardial fluid remains within normal limits. The thyroid gland is not well characterized. No axillary lymphadenopathy is seen. A left-sided chest port is noted ending about the distal SVC. 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 Lungs/Pleura: Patchy bibasilar airspace opacities raise concern for mild infection. Mild fibrotic change and emphysematous change are noted bilaterally. No pleural effusion or pneumothorax is seen. No dominant mass is identified. Musculoskeletal: No acute osseous abnormalities are identified. Mild degenerative change is noted at the lower cervical spine. The visualized musculature is unremarkable in appearance. Review of the MIP images confirms the above findings. CT ABDOMEN and PELVIS FINDINGS Hepatobiliary: Diffuse  periportal edema is noted within the liver.  A common bile duct stent is seen in expected position. No definite intrahepatic biliary ductal dilatation is seen. There is significant gallbladder wall thickening, with marked distention of the gallbladder, concerning for acute cholecystitis. Trace ascites is seen tracking about the liver. Pancreas: The known pancreatic mass is difficult to fully characterize, with vague soft tissue tracking about the portal venous system and body of the pancreas. There is increasing wall thickening involving the antrum of the stomach. Underlying infiltration of mass cannot be excluded. Vague soft tissue density is seen tracking about the mesentery and underlying retroperitoneum more inferiorly. There is near complete effacement of the superior mesenteric vein, new from the prior study and raising suspicion for compression by the underlying mass. Spleen: The spleen is unremarkable in appearance. Adrenals/Urinary Tract: The adrenal glands are unremarkable in appearance. There is mild-to-moderate right-sided hydronephrosis, raising suspicion for ureteral obstruction at the lower abdomen, likely reflecting underlying diffuse infiltrative mass. This is also new from the prior study. Scattered bilateral renal cysts are seen. Nonspecific perinephric stranding is noted bilaterally. No obstructing ureteral stones are identified. Stomach/Bowel: There is swirling of the superior mesenteric artery and vein, concerning for some degree of twisting of the small bowel about the mesenteric axis, without evidence for bowel ischemia at this time. As described above, there is near complete effacement of the superior mesenteric vein due to surrounding mass. Additional scattered peritoneal metastases are seen tracking about the omentum at the left lower quadrant, and along the paracolic gutters bilaterally, suspicious for worsening diffuse peritoneal carcinomatosis. The stomach is mildly distended with fluid  and air. Vague wall thickening is noted along the ascending colon, of uncertain significance. Scattered diverticulosis is seen along the proximal sigmoid colon. Postoperative change is noted at the right lower quadrant and at the sigmoid colon. Vascular/Lymphatic: Diffuse calcification is seen along the abdominal aorta and its branches. The abdominal aorta is otherwise grossly unremarkable. The inferior vena cava is grossly unremarkable. No retroperitoneal lymphadenopathy is seen. No pelvic sidewall lymphadenopathy is identified. Reproductive: The bladder is relatively decompressed and not well assessed. The prostate is enlarged, measuring perhaps 6.1 cm in transverse dimension. Other: No additional soft tissue abnormalities are seen. Musculoskeletal: No acute osseous abnormalities are identified. The patient left hip arthroplasty is grossly unremarkable in appearance. Degenerative change is noted at the right hip. There is chronic osseous fusion at L4-L5. Multilevel vacuum phenomenon is seen along the lumbar spine. The visualized musculature is unremarkable in appearance. Review of the MIP images confirms the above findings. IMPRESSION: 1. No evidence of pulmonary embolus. 2. Significant gallbladder wall thickening, with marked distention of the gallbladder, concerning for acute cholecystitis, new from the prior study. 3. Patchy bibasilar airspace opacities raise concern for mild infection. Mild fibrotic change and emphysema noted bilaterally. 4. Known pancreatic mass is difficult to fully characterize, with vague soft tissue tracking about the portal venous system and body of the pancreas. Increasing wall thickening involving the antrum of the stomach; underlying infiltrative mass cannot be excluded. 5. Vague soft tissue density tracks about the mesentery and more inferior retroperitoneum, worsened from the prior study, with new near complete effacement of the superior mesenteric vein, raising suspicion for  compression by the underlying mass. 6. Mild-to-moderate right-sided hydronephrosis raises concern for partial right ureteral obstruction at the lower abdomen due to the underlying infiltrating mass. 7. Swirling of the superior mesenteric artery and vein, concerning for some degree of twisting of small bowel about the vague mass along the mesenteric axis, without evidence  for bowel ischemia. 8. Additional scattered peritoneal masses tracking about the omentum at the left lower quadrant, and at the paracolic gutters bilaterally, suspicious for worsening diffuse peritoneal carcinomatosis. 9. Vague wall thickening along the ascending colon, of uncertain significance. Underlying infiltrative mass cannot be excluded. 10. Diffuse periportal edema noted. Common bile duct stent noted in expected position. Trace ascites noted about the liver. 11. 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 Associated diffuse filling of collateral vessels about the chest wall and neck. 12. Mild degenerative change at the lower cervical spine. Chronic osseous fusion at L4-L5, and degenerative change along the lumbar spine. Degenerative change at the right hip. 13. Diffuse aortic atherosclerosis. 14. Scattered diverticulosis along the proximal sigmoid colon, without evidence of diverticulitis. 15. Enlarged prostate noted. These results were called by telephone at the time of interpretation on 06/06/2016 at 11:08 pm to Dr. Brantley Stage, who verbally acknowledged these results. Electronically Signed   By: Garald Balding M.D.   On: 06/22/2016 23:10   Ct Abdomen Pelvis W Contrast  Result Date: 05/28/2016 CLINICAL DATA:  Acute onset of shortness of breath and chest tightness. Generalized abdominal pain. Loose stools. Current history of pancreatic cancer, on chemotherapy. Initial encounter. EXAM: CT ANGIOGRAPHY CHEST CT ABDOMEN AND PELVIS WITH CONTRAST TECHNIQUE: Multidetector CT imaging of the chest was  performed using the standard protocol during bolus administration of intravenous contrast. Multiplanar CT image reconstructions and MIPs were obtained to evaluate the vascular anatomy. Multidetector CT imaging of the abdomen and pelvis was performed using the standard protocol during bolus administration of intravenous contrast. CONTRAST:  100 mL of Isovue 370 IV contrast COMPARISON:  CT of the chest, abdomen and pelvis performed 05/19/2016 FINDINGS: CTA CHEST FINDINGS Cardiovascular:  There is no evidence of pulmonary embolus. The heart is borderline normal in size. Scattered calcification is noted along the aortic arch and descending thoracic aorta. The great vessels are grossly unremarkable in appearance, though difficult to fully assess due to artifact from contrast and beam hardening artifact. Mediastinum/Nodes: The mediastinum is grossly unremarkable in appearance. No mediastinal lymphadenopathy is seen. Trace pericardial fluid remains within normal limits. The thyroid gland is not well characterized. No axillary lymphadenopathy is seen. A left-sided chest port is noted ending about the distal SVC. 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 Lungs/Pleura: Patchy bibasilar airspace opacities raise concern for mild infection. Mild fibrotic change and emphysematous change are noted bilaterally. No pleural effusion or pneumothorax is seen. No dominant mass is identified. Musculoskeletal: No acute osseous abnormalities are identified. Mild degenerative change is noted at the lower cervical spine. The visualized musculature is unremarkable in appearance. Review of the MIP images confirms the above findings. CT ABDOMEN and PELVIS FINDINGS Hepatobiliary: Diffuse periportal edema is noted within the liver. A common bile duct stent is seen in expected position. No definite intrahepatic biliary ductal dilatation is seen. There is  significant gallbladder wall thickening, with marked distention of the gallbladder, concerning for acute cholecystitis. Trace ascites is seen tracking about the liver. Pancreas: The known pancreatic mass is difficult to fully characterize, with vague soft tissue tracking about the portal venous system and body of the pancreas. There is increasing wall thickening involving the antrum of the stomach. Underlying infiltration of mass cannot be excluded. Vague soft tissue density is seen tracking about the mesentery and underlying retroperitoneum more inferiorly. There is near complete effacement of the  superior mesenteric vein, new from the prior study and raising suspicion for compression by the underlying mass. Spleen: The spleen is unremarkable in appearance. Adrenals/Urinary Tract: The adrenal glands are unremarkable in appearance. There is mild-to-moderate right-sided hydronephrosis, raising suspicion for ureteral obstruction at the lower abdomen, likely reflecting underlying diffuse infiltrative mass. This is also new from the prior study. Scattered bilateral renal cysts are seen. Nonspecific perinephric stranding is noted bilaterally. No obstructing ureteral stones are identified. Stomach/Bowel: There is swirling of the superior mesenteric artery and vein, concerning for some degree of twisting of the small bowel about the mesenteric axis, without evidence for bowel ischemia at this time. As described above, there is near complete effacement of the superior mesenteric vein due to surrounding mass. Additional scattered peritoneal metastases are seen tracking about the omentum at the left lower quadrant, and along the paracolic gutters bilaterally, suspicious for worsening diffuse peritoneal carcinomatosis. The stomach is mildly distended with fluid and air. Vague wall thickening is noted along the ascending colon, of uncertain significance. Scattered diverticulosis is seen along the proximal sigmoid colon.  Postoperative change is noted at the right lower quadrant and at the sigmoid colon. Vascular/Lymphatic: Diffuse calcification is seen along the abdominal aorta and its branches. The abdominal aorta is otherwise grossly unremarkable. The inferior vena cava is grossly unremarkable. No retroperitoneal lymphadenopathy is seen. No pelvic sidewall lymphadenopathy is identified. Reproductive: The bladder is relatively decompressed and not well assessed. The prostate is enlarged, measuring perhaps 6.1 cm in transverse dimension. Other: No additional soft tissue abnormalities are seen. Musculoskeletal: No acute osseous abnormalities are identified. The patient left hip arthroplasty is grossly unremarkable in appearance. Degenerative change is noted at the right hip. There is chronic osseous fusion at L4-L5. Multilevel vacuum phenomenon is seen along the lumbar spine. The visualized musculature is unremarkable in appearance. Review of the MIP images confirms the above findings. IMPRESSION: 1. No evidence of pulmonary embolus. 2. Significant gallbladder wall thickening, with marked distention of the gallbladder, concerning for acute cholecystitis, new from the prior study. 3. Patchy bibasilar airspace opacities raise concern for mild infection. Mild fibrotic change and emphysema noted bilaterally. 4. Known pancreatic mass is difficult to fully characterize, with vague soft tissue tracking about the portal venous system and body of the pancreas. Increasing wall thickening involving the antrum of the stomach; underlying infiltrative mass cannot be excluded. 5. Vague soft tissue density tracks about the mesentery and more inferior retroperitoneum, worsened from the prior study, with new near complete effacement of the superior mesenteric vein, raising suspicion for compression by the underlying mass. 6. Mild-to-moderate right-sided hydronephrosis raises concern for partial right ureteral obstruction at the lower abdomen due to  the underlying infiltrating mass. 7. Swirling of the superior mesenteric artery and vein, concerning for some degree of twisting of small bowel about the vague mass along the mesenteric axis, without evidence for bowel ischemia. 8. Additional scattered peritoneal masses tracking about the omentum at the left lower quadrant, and at the paracolic gutters bilaterally, suspicious for worsening diffuse peritoneal carcinomatosis. 9. Vague wall thickening along the ascending colon, of uncertain significance. Underlying infiltrative mass cannot be excluded. 10. Diffuse periportal edema noted. Common bile duct stent noted in expected position. Trace ascites noted about the liver. 11. 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 Associated diffuse filling of collateral vessels about the chest wall and neck. 12. Mild degenerative change at the lower cervical spine. Chronic osseous fusion at L4-L5, and degenerative change  along the lumbar spine. Degenerative change at the right hip. 13. Diffuse aortic atherosclerosis. 14. Scattered diverticulosis along the proximal sigmoid colon, without evidence of diverticulitis. 15. Enlarged prostate noted. These results were called by telephone at the time of interpretation on 06/04/2016 at 11:08 pm to Dr. Brantley Stage, who verbally acknowledged these results. Electronically Signed   By: Garald Balding M.D.   On: 05/26/2016 23:10   Dg Chest Portable 1 View  Result Date: 06/01/2016 CLINICAL DATA:  Acute onset of generalized weakness and shortness of breath. Generalized chest tightness and dry mouth. Initial encounter. EXAM: PORTABLE CHEST 1 VIEW COMPARISON:  Chest radiograph and CTA of the chest performed earlier today at 10:14 p.m. FINDINGS: The lungs are well-aerated. Worsening patchy bilateral airspace opacification is noted. This raises suspicion for pulmonary edema, given interval change since the prior CTA, and worsening interstitial  prominence. No pleural effusion or pneumothorax is seen. The cardiomediastinal silhouette is normal in size. No acute osseous abnormalities are identified. A left-sided chest port is noted ending about the distal SVC. IMPRESSION: Worsening patchy bilateral airspace opacification is suspicious for pulmonary edema. Pneumonia is considered less likely but remains a possibility. Electronically Signed   By: Garald Balding M.D.   On: 06/18/2016 23:38    EKG: Independently reviewed. Sinus tachycardia with nonspecific ST-T changes.  Assessment/Plan Principal Problem:   Sepsis ([place]) Active Problems:   Diabetes type 2, controlled ([place])   Malignant neoplasm of body of pancreas ([place])   Acute respiratory failure with hypoxia ([place])    1. Severe sepsis - probably from cholecystitis/cholangitis and possible pneumonia. Patient is placed on empiric antibiotics follow blood cultures and lactate levels. Patient is a DO NOT RESUSCITATE. Not on vasopressors due to that. Unable to give more fluids to 2 pulmonary edema. Check stool studies if there is further diarrhea. 2. Possible acute cholecystitis - general surgeon Dr. Hulen Skains as requested IR consult as patient will not be a surgical candidate. Continue empiric antibiotics. Will keep patient nothing by mouth. 3. Acute respiratory failure secondary to pulmonary edema - continue CPAP. 4. Diabetes mellitus type 2 - will keep patient on sliding scale coverage. 5. Hypertension presently hypotensive holding antihypertensives. 6. History of untreated prostate cancer. 7. Thrombocytopenia probably from chemotherapy and sepsis. 8. Pancreatic cancer undergoing chemotherapy last one 2 days ago.   DVT prophylaxis: Lovenox. Code Status: DO NOT RESUSCITATE.  Family Communication: Patient's wife.  Disposition Plan: To be determined.  Consults called: Palliative care.  Admission status: Inpatient.    Rise Patience MD [facility] Pager [phone].  If  7PM-7AM, please contact night-coverage [email or web address] Password [facility]  06/15/2016, 3:28 AM

## 2016-06-15 NOTE — Progress Notes (Signed)
Lactic Acid 7.6 reported to Dr. Hal Hope via phone

## 2016-06-15 NOTE — Progress Notes (Signed)
   06/15/16 0000  BiPAP/CPAP/SIPAP  BiPAP/CPAP/SIPAP Pt Type Adult  Mask Type Full face mask  Mask Size Medium  Set Rate 10 breaths/min  Respiratory Rate 24 breaths/min  IPAP 12 cmH20  EPAP 6 cmH2O  Oxygen Percent 60 %  Flow Rate 0 lpm  Minute Ventilation 19.9  Leak 0  Peak Inspiratory Pressure (PIP) 13  BiPAP/CPAP/SIPAP BiPAP  Patient Home Equipment No  Auto Titrate No  BiPAP/CPAP /SiPAP Vitals  Pulse Rate (!) 107  Resp (!) 24  SpO2 99 %  Patient placed on Bipap on above settings due to increase WOB and hypoxia

## 2016-06-15 NOTE — Sedation Documentation (Signed)
Patient is resting comfortably. 

## 2016-06-15 NOTE — Progress Notes (Signed)
[name] is a 81 y.o. male admitted early this morning for severe sepsis due to cholecystitis, possible pneumonia. He has a history of pancreatic CA on chemotherapy and obstructive jaundice s/p biliary stent placement 3 weeks ago and presented with fever, chills, loose stools and epigastric/RUQ abdominal pain. On presentation he was tachycardic and hypotensive with initial lactic acid of 10. CT abd/pelvis and CTA chest showed evidence of cholecystitis and possible pneumonia with progression of pancreatic mass. IVF's and empiric antibiotics were provided, though he developed worsening dyspnea and repeat CXR showed pulmonary edema so fluids were stopped. He is not a candidate for surgery, so I have consulted IR for consideration of perc drain placement.   On exam MAP is 15mmHg, HR sinus tachycardia hovering around 100bpm, tachypneic and 90% on 12L by [place]. Bibasilar crackles, no JVD.   BP has remained low and he has required NIPPV up until about 8:30am, now on high-flow oxygen by nasal cannula. Tells me he would like to avoid further NIPPV if at all possible. He continues to decline pressors or intubation, understanding that his current status is critical. We discussed an alternative option, which would be to discontinue all aggressive medical interventions and pursue the singular goal of comfort care. He and his family have a good understanding of his situation and wish to continue with our current plan of care. Blood cultures just became positive for E. coli.  Severe sepsis due to E. coli bacteremia from cholecystitis: Lactic acid mildly improved, still grossly elevated. - Continue abx pending susceptibility data - Hold IVF due to pulmonary edema - IR to evaluate for drain today. Pt amenable.   Acute respiratory failure: Due to sepsis, pulmonary edema.  - Holding IVF (got 2L NS in ED) and lasix at this time - O2 by nasal cannula.  - Will continue close monitoring in ICU, step-down status, and if  requires NIPPV, will consider low dose ativan.   Pancreatic CA:  - Dr. Burr Medico added to treatment team as Chales Abrahams, MD 06/15/2016 9:13 AM

## 2016-06-15 NOTE — Progress Notes (Signed)
Pt is getting more hypotensive BP- 77/55, still full DNR per MD's conversation with pt and family this morning. Now, patient and family is asking to treat hypotension, still don't need any intubation. Informed Dr.Grunz, said will contact CCM.  Gershon Crane

## 2016-06-15 NOTE — Procedures (Signed)
Interventional Radiology Procedure Note  Procedure: Percutaneous cholecystostomy.    Complications: None Recommendations:  - Record output from tube daily - Flush tube daily with ~5cc saline - Do not submerge - Routine care   Signed,  Dulcy Fanny. Earleen Newport, DO

## 2016-06-15 NOTE — Telephone Encounter (Signed)
Received vm call from pt's family-Stephen Nied stating that pt was admitted to [facility] yest eve & would like to make sure Dr Burr Medico is aware of admitting diagnosis & request call back at [phone].  Message to Dr Burr Medico.

## 2016-06-15 NOTE — Telephone Encounter (Signed)
"  This is Tracey Stewart calling in reference to my father, a patient of Dr. Burr Medico.  He's been admitted to [facility] yesterday evening.  Want to make sure she's aware and the diagnosis found."   Will notify provider.  Patient located room 2MO4C-01.

## 2016-06-15 NOTE — Progress Notes (Signed)
[name]   DOB:08-31-1935   VZ#:[id number]   PIR#:[id number]  Oncology follow up note   Subjective: [name] is well-know to me, under my care for his pancreatic cancer. Due to significant disease progression, he recently started palliative chemotherapy. His last dose was 2 days ago. He was admitted yesterday for sepsis secondary to cholecystitis.He was in septic shock when he came in. Pt and his family met palliative Dr. Hilma Favors this afternoon and he was changed to comfort care. He received ativan for hsi agitation a short while ago before I saw him, he was sedated, but appears to be comfortable.    Objective:  Vitals:   06/15/16 1504 06/15/16 2000  BP:  (!) 91/53  Pulse:  (!) 106  Resp:  (!) 35  Temp: 97.6 F (36.4 C)     Body mass index is 17.94 kg/m.  Intake/Output Summary (Last 24 hours) at 06/15/16 2049 Last data filed at 06/15/16 2000  Gross per 24 hour  Intake             2400 ml  Output              265 ml  Net             2135 ml    Physical exam was deferred   CBG (last 3)   Recent Labs  06/15/16 0450  GLUCAP 138*     Labs:  Lab Results  Component Value Date   WBC 12.1 (H) 06/15/2016   HGB 8.7 (L) 06/15/2016   HCT 27.0 (L) 06/15/2016   MCV 82.8 06/15/2016   PLT 86 (L) 06/15/2016   NEUTROABS 11.4 (H) 06/15/2016    _0 @  Urine Studies No results for input(s): UHGB, CRYS in the last 72 hours.  Invalid input(s): UACOL, UAPR, USPG, UPH, UTP, UGL, UKET, UBIL, UNIT, UROB, ULEU, UEPI, UWBC, URBC, [place], [place], [place], [place]  Basic Metabolic Panel:  Recent Labs Lab 06/13/16 1035 06/18/2016 2004 06/15/16 0325  NA 138 137 135  K 3.9 4.2 4.1  CL  --  103 104  CO2 26 15* 11*  GLUCOSE 235* 171* 144*  BUN 17.5 21* 23*  CREATININE 1.0 1.32* 1.27*  CALCIUM 9.1 7.5* 7.2*   GFR Estimated Creatinine Clearance: 38.4 mL/min (A) (by C-G formula based on SCr of 1.27 mg/dL (H)). Liver Function Tests:  Recent Labs Lab 06/13/16 0946 06/13/16 1035  06/15/2016 2004 06/15/16 0325  AST 41* 45* <5* 208*  ALT 55* 64* <5* 156*  ALKPHOS 148* 165* 237* 234*  BILITOT 2.7* 2.62* 4.6* 5.5*  PROT 6.7 6.8 5.4* 5.5*  ALBUMIN 3.3* 2.6* 2.2* 2.1*    Recent Labs Lab 06/13/2016 2004  LIPASE <10*   No results for input(s): AMMONIA in the last 168 hours. Coagulation profile  Recent Labs Lab 06/05/2016 2004 06/15/16 0350  INR 1.53 1.49    CBC:  Recent Labs Lab 06/13/16 1035 06/11/2016 2004 06/15/16 0325  WBC 7.4 12.9* 12.1*  NEUTROABS 6.0 12.6* 11.4*  HGB 10.6* 10.2* 8.7*  HCT 31.7* 31.4* 27.0*  MCV 81.8 81.1 82.8  PLT 256 116* 86*   Cardiac Enzymes:  Recent Labs Lab 06/15/16 0755 06/15/16 1231  TROPONINI 0.09* 0.17*   BNP: Invalid input(s): POCBNP CBG:  Recent Labs Lab 06/15/16 0450  GLUCAP 138*   D-Dimer No results for input(s): DDIMER in the last 72 hours. Hgb A1c No results for input(s): HGBA1C in the last 72 hours. Lipid Profile No results for input(s): CHOL, HDL, LDLCALC,  TRIG, CHOLHDL, LDLDIRECT in the last 72 hours. Thyroid function studies No results for input(s): TSH, T4TOTAL, T3FREE, THYROIDAB in the last 72 hours.  Invalid input(s): FREET3 Anemia work up No results for input(s): VITAMINB12, FOLATE, FERRITIN, TIBC, IRON, RETICCTPCT in the last 72 hours. Microbiology Recent Results (from the past 240 hour(s))  Culture, blood (Routine x 2)     Status: None (Preliminary result)   Collection Time: 06/16/2016  7:49 PM  Result Value Ref Range Status   Specimen Description BLOOD LEFT ANTECUBITAL  Final   Special Requests BOTTLES DRAWN AEROBIC AND ANAEROBIC [place]  Final   Culture  Setup Time   Final    GRAM NEGATIVE RODS IN BOTH AEROBIC AND ANAEROBIC BOTTLES Organism ID to follow CRITICAL RESULT CALLED TO, READ BACK BY AND VERIFIED WITH: A JOHNSON 06/15/16 @ 0856 M VESTAL    Culture GRAM NEGATIVE RODS  Final   Report Status PENDING  Incomplete  Blood Culture ID Panel (Reflexed)     Status: Abnormal    Collection Time: 06/06/2016  7:49 PM  Result Value Ref Range Status   Enterococcus species NOT DETECTED NOT DETECTED Final   Listeria monocytogenes NOT DETECTED NOT DETECTED Final   Staphylococcus species NOT DETECTED NOT DETECTED Final   Staphylococcus aureus NOT DETECTED NOT DETECTED Final   Streptococcus species NOT DETECTED NOT DETECTED Final   Streptococcus agalactiae NOT DETECTED NOT DETECTED Final   Streptococcus pneumoniae NOT DETECTED NOT DETECTED Final   Streptococcus pyogenes NOT DETECTED NOT DETECTED Final   Acinetobacter baumannii NOT DETECTED NOT DETECTED Final   Enterobacteriaceae species DETECTED (A) NOT DETECTED Final    Comment: Enterobacteriaceae represent a large family of gram-negative bacteria, not a single organism. CRITICAL RESULT CALLED TO, READ BACK BY AND VERIFIED WITH: A JOHNSON 06/15/16 @ 0856 M VESTAL    Enterobacter cloacae complex NOT DETECTED NOT DETECTED Final   Escherichia coli DETECTED (A) NOT DETECTED Final    Comment: CRITICAL RESULT CALLED TO, READ BACK BY AND VERIFIED WITH: A JOHNSON 06/15/16 @ 0856 M VESTAL    Klebsiella oxytoca NOT DETECTED NOT DETECTED Final   Klebsiella pneumoniae NOT DETECTED NOT DETECTED Final   Proteus species NOT DETECTED NOT DETECTED Final   Serratia marcescens NOT DETECTED NOT DETECTED Final   Carbapenem resistance NOT DETECTED NOT DETECTED Final   Haemophilus influenzae NOT DETECTED NOT DETECTED Final   Neisseria meningitidis NOT DETECTED NOT DETECTED Final   Pseudomonas aeruginosa NOT DETECTED NOT DETECTED Final   Candida albicans NOT DETECTED NOT DETECTED Final   Candida glabrata NOT DETECTED NOT DETECTED Final   Candida krusei NOT DETECTED NOT DETECTED Final   Candida parapsilosis NOT DETECTED NOT DETECTED Final   Candida tropicalis NOT DETECTED NOT DETECTED Final  Culture, blood (Routine x 2)     Status: None (Preliminary result)   Collection Time: 06/24/2016  8:59 PM  Result Value Ref Range Status   Specimen  Description BLOOD RIGHT ANTECUBITAL  Final   Special Requests IN PEDIATRIC BOTTLE 3CC  Final   Culture  Setup Time   Final    GRAM NEGATIVE RODS IN PEDIATRIC BOTTLE CRITICAL RESULT CALLED TO, READ BACK BY AND VERIFIED WITH: A JOHNSON 06/15/16 @ 4098 M VESTAL    Culture GRAM NEGATIVE RODS  Final   Report Status PENDING  Incomplete  MRSA PCR Screening     Status: None   Collection Time: 06/15/16  5:00 AM  Result Value Ref Range Status   MRSA by PCR NEGATIVE NEGATIVE  Final    Comment:        The GeneXpert MRSA Assay (FDA approved for NASAL specimens only), is one component of a comprehensive MRSA colonization surveillance program. It is not intended to diagnose MRSA infection nor to guide or monitor treatment for MRSA infections.   Aerobic/Anaerobic Culture (surgical/deep wound)     Status: None (Preliminary result)   Collection Time: 06/15/16 12:02 PM  Result Value Ref Range Status   Specimen Description BILE  Final   Special Requests NONE  Final   Gram Stain   Final    MODERATE WBC PRESENT, PREDOMINANTLY MONONUCLEAR MODERATE GRAM NEGATIVE RODS    Culture PENDING  Incomplete   Report Status PENDING  Incomplete      Studies:  Dg Chest 2 View  Result Date: 06/03/2016 CLINICAL DATA:  Chest tightness EXAM: CHEST  2 VIEW COMPARISON:  05/25/16 FINDINGS: There is a left chest wall port a catheter with tip in the cavoatrial junction. Heart size appears normal. No pleural effusion or edema. No airspace opacities. The visualized osseous structures are unremarkable. IMPRESSION: No active cardiopulmonary disease. Electronically Signed   By: Kerby Moors M.D.   On: 05/26/2016 20:36   Ct Angio Chest Pe W And/or Wo Contrast  Result Date: 06/09/2016 CLINICAL DATA:  Acute onset of shortness of breath and chest tightness. Generalized abdominal pain. Loose stools. Current history of pancreatic cancer, on chemotherapy. Initial encounter. EXAM: CT ANGIOGRAPHY CHEST CT ABDOMEN AND PELVIS WITH  CONTRAST TECHNIQUE: Multidetector CT imaging of the chest was performed using the standard protocol during bolus administration of intravenous contrast. Multiplanar CT image reconstructions and MIPs were obtained to evaluate the vascular anatomy. Multidetector CT imaging of the abdomen and pelvis was performed using the standard protocol during bolus administration of intravenous contrast. CONTRAST:  100 mL of Isovue 370 IV contrast COMPARISON:  CT of the chest, abdomen and pelvis performed 05/19/2016 FINDINGS: CTA CHEST FINDINGS Cardiovascular:  There is no evidence of pulmonary embolus. The heart is borderline normal in size. Scattered calcification is noted along the aortic arch and descending thoracic aorta. The great vessels are grossly unremarkable in appearance, though difficult to fully assess due to artifact from contrast and beam hardening artifact. Mediastinum/Nodes: The mediastinum is grossly unremarkable in appearance. No mediastinal lymphadenopathy is seen. Trace pericardial fluid remains within normal limits. The thyroid gland is not well characterized. No axillary lymphadenopathy is seen. A left-sided chest port is noted ending about the distal SVC. 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 Lungs/Pleura: Patchy bibasilar airspace opacities raise concern for mild infection. Mild fibrotic change and emphysematous change are noted bilaterally. No pleural effusion or pneumothorax is seen. No dominant mass is identified. Musculoskeletal: No acute osseous abnormalities are identified. Mild degenerative change is noted at the lower cervical spine. The visualized musculature is unremarkable in appearance. Review of the MIP images confirms the above findings. CT ABDOMEN and PELVIS FINDINGS Hepatobiliary: Diffuse periportal edema is noted within the liver. A common bile duct stent is seen in expected position. No  definite intrahepatic biliary ductal dilatation is seen. There is significant gallbladder wall thickening, with marked distention of the gallbladder, concerning for acute cholecystitis. Trace ascites is seen tracking about the liver. Pancreas: The known pancreatic mass is difficult to fully characterize, with vague soft tissue tracking about the portal venous system and body of the pancreas. There is increasing wall thickening involving the antrum of  the stomach. Underlying infiltration of mass cannot be excluded. Vague soft tissue density is seen tracking about the mesentery and underlying retroperitoneum more inferiorly. There is near complete effacement of the superior mesenteric vein, new from the prior study and raising suspicion for compression by the underlying mass. Spleen: The spleen is unremarkable in appearance. Adrenals/Urinary Tract: The adrenal glands are unremarkable in appearance. There is mild-to-moderate right-sided hydronephrosis, raising suspicion for ureteral obstruction at the lower abdomen, likely reflecting underlying diffuse infiltrative mass. This is also new from the prior study. Scattered bilateral renal cysts are seen. Nonspecific perinephric stranding is noted bilaterally. No obstructing ureteral stones are identified. Stomach/Bowel: There is swirling of the superior mesenteric artery and vein, concerning for some degree of twisting of the small bowel about the mesenteric axis, without evidence for bowel ischemia at this time. As described above, there is near complete effacement of the superior mesenteric vein due to surrounding mass. Additional scattered peritoneal metastases are seen tracking about the omentum at the left lower quadrant, and along the paracolic gutters bilaterally, suspicious for worsening diffuse peritoneal carcinomatosis. The stomach is mildly distended with fluid and air. Vague wall thickening is noted along the ascending colon, of uncertain significance.  Scattered diverticulosis is seen along the proximal sigmoid colon. Postoperative change is noted at the right lower quadrant and at the sigmoid colon. Vascular/Lymphatic: Diffuse calcification is seen along the abdominal aorta and its branches. The abdominal aorta is otherwise grossly unremarkable. The inferior vena cava is grossly unremarkable. No retroperitoneal lymphadenopathy is seen. No pelvic sidewall lymphadenopathy is identified. Reproductive: The bladder is relatively decompressed and not well assessed. The prostate is enlarged, measuring perhaps 6.1 cm in transverse dimension. Other: No additional soft tissue abnormalities are seen. Musculoskeletal: No acute osseous abnormalities are identified. The patient left hip arthroplasty is grossly unremarkable in appearance. Degenerative change is noted at the right hip. There is chronic osseous fusion at L4-L5. Multilevel vacuum phenomenon is seen along the lumbar spine. The visualized musculature is unremarkable in appearance. Review of the MIP images confirms the above findings. IMPRESSION: 1. No evidence of pulmonary embolus. 2. Significant gallbladder wall thickening, with marked distention of the gallbladder, concerning for acute cholecystitis, new from the prior study. 3. Patchy bibasilar airspace opacities raise concern for mild infection. Mild fibrotic change and emphysema noted bilaterally. 4. Known pancreatic mass is difficult to fully characterize, with vague soft tissue tracking about the portal venous system and body of the pancreas. Increasing wall thickening involving the antrum of the stomach; underlying infiltrative mass cannot be excluded. 5. Vague soft tissue density tracks about the mesentery and more inferior retroperitoneum, worsened from the prior study, with new near complete effacement of the superior mesenteric vein, raising suspicion for compression by the underlying mass. 6. Mild-to-moderate right-sided hydronephrosis raises concern  for partial right ureteral obstruction at the lower abdomen due to the underlying infiltrating mass. 7. Swirling of the superior mesenteric artery and vein, concerning for some degree of twisting of small bowel about the vague mass along the mesenteric axis, without evidence for bowel ischemia. 8. Additional scattered peritoneal masses tracking about the omentum at the left lower quadrant, and at the paracolic gutters bilaterally, suspicious for worsening diffuse peritoneal carcinomatosis. 9. Vague wall thickening along the ascending colon, of uncertain significance. Underlying infiltrative mass cannot be excluded. 10. Diffuse periportal edema noted. Common bile duct stent noted in expected position. Trace ascites noted about the liver. 11. 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 Associated diffuse filling of collateral vessels about the chest wall and neck. 12. Mild degenerative change at the lower cervical spine. Chronic osseous fusion at L4-L5, and degenerative change along the lumbar spine. Degenerative change at the right hip. 13. Diffuse aortic atherosclerosis. 14. Scattered diverticulosis along the proximal sigmoid colon, without evidence of diverticulitis. 15. Enlarged prostate noted. These results were called by telephone at the time of interpretation on 06/06/2016 at 11:08 pm to Dr. Brantley Stage, who verbally acknowledged these results. Electronically Signed   By: Garald Balding M.D.   On: 05/26/2016 23:10   Ct Abdomen Pelvis W Contrast  Result Date: 05/31/2016 CLINICAL DATA:  Acute onset of shortness of breath and chest tightness. Generalized abdominal pain. Loose stools. Current history of pancreatic cancer, on chemotherapy. Initial encounter. EXAM: CT ANGIOGRAPHY CHEST CT ABDOMEN AND PELVIS WITH CONTRAST TECHNIQUE: Multidetector CT imaging of the chest was performed using the standard protocol during bolus administration of intravenous contrast.  Multiplanar CT image reconstructions and MIPs were obtained to evaluate the vascular anatomy. Multidetector CT imaging of the abdomen and pelvis was performed using the standard protocol during bolus administration of intravenous contrast. CONTRAST:  100 mL of Isovue 370 IV contrast COMPARISON:  CT of the chest, abdomen and pelvis performed 05/19/2016 FINDINGS: CTA CHEST FINDINGS Cardiovascular:  There is no evidence of pulmonary embolus. The heart is borderline normal in size. Scattered calcification is noted along the aortic arch and descending thoracic aorta. The great vessels are grossly unremarkable in appearance, though difficult to fully assess due to artifact from contrast and beam hardening artifact. Mediastinum/Nodes: The mediastinum is grossly unremarkable in appearance. No mediastinal lymphadenopathy is seen. Trace pericardial fluid remains within normal limits. The thyroid gland is not well characterized. No axillary lymphadenopathy is seen. A left-sided chest port is noted ending about the distal SVC. 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 Lungs/Pleura: Patchy bibasilar airspace opacities raise concern for mild infection. Mild fibrotic change and emphysematous change are noted bilaterally. No pleural effusion or pneumothorax is seen. No dominant mass is identified. Musculoskeletal: No acute osseous abnormalities are identified. Mild degenerative change is noted at the lower cervical spine. The visualized musculature is unremarkable in appearance. Review of the MIP images confirms the above findings. CT ABDOMEN and PELVIS FINDINGS Hepatobiliary: Diffuse periportal edema is noted within the liver. A common bile duct stent is seen in expected position. No definite intrahepatic biliary ductal dilatation is seen. There is significant gallbladder wall thickening, with marked distention of the gallbladder,  concerning for acute cholecystitis. Trace ascites is seen tracking about the liver. Pancreas: The known pancreatic mass is difficult to fully characterize, with vague soft tissue tracking about the portal venous system and body of the pancreas. There is increasing wall thickening involving the antrum of the stomach. Underlying infiltration of mass cannot be excluded. Vague soft tissue density is seen tracking about the mesentery and underlying retroperitoneum more inferiorly. There is near complete effacement of the superior mesenteric vein, new from the prior study and raising suspicion for compression by the underlying mass. Spleen: The spleen is unremarkable in appearance. Adrenals/Urinary Tract: The adrenal glands are unremarkable in appearance. There is mild-to-moderate right-sided hydronephrosis, raising suspicion for ureteral obstruction at the lower abdomen, likely reflecting underlying diffuse infiltrative mass. This is also new from the prior study. Scattered bilateral renal cysts are seen. Nonspecific perinephric stranding is noted bilaterally. No obstructing ureteral  stones are identified. Stomach/Bowel: There is swirling of the superior mesenteric artery and vein, concerning for some degree of twisting of the small bowel about the mesenteric axis, without evidence for bowel ischemia at this time. As described above, there is near complete effacement of the superior mesenteric vein due to surrounding mass. Additional scattered peritoneal metastases are seen tracking about the omentum at the left lower quadrant, and along the paracolic gutters bilaterally, suspicious for worsening diffuse peritoneal carcinomatosis. The stomach is mildly distended with fluid and air. Vague wall thickening is noted along the ascending colon, of uncertain significance. Scattered diverticulosis is seen along the proximal sigmoid colon. Postoperative change is noted at the right lower quadrant and at the sigmoid colon.  Vascular/Lymphatic: Diffuse calcification is seen along the abdominal aorta and its branches. The abdominal aorta is otherwise grossly unremarkable. The inferior vena cava is grossly unremarkable. No retroperitoneal lymphadenopathy is seen. No pelvic sidewall lymphadenopathy is identified. Reproductive: The bladder is relatively decompressed and not well assessed. The prostate is enlarged, measuring perhaps 6.1 cm in transverse dimension. Other: No additional soft tissue abnormalities are seen. Musculoskeletal: No acute osseous abnormalities are identified. The patient left hip arthroplasty is grossly unremarkable in appearance. Degenerative change is noted at the right hip. There is chronic osseous fusion at L4-L5. Multilevel vacuum phenomenon is seen along the lumbar spine. The visualized musculature is unremarkable in appearance. Review of the MIP images confirms the above findings. IMPRESSION: 1. No evidence of pulmonary embolus. 2. Significant gallbladder wall thickening, with marked distention of the gallbladder, concerning for acute cholecystitis, new from the prior study. 3. Patchy bibasilar airspace opacities raise concern for mild infection. Mild fibrotic change and emphysema noted bilaterally. 4. Known pancreatic mass is difficult to fully characterize, with vague soft tissue tracking about the portal venous system and body of the pancreas. Increasing wall thickening involving the antrum of the stomach; underlying infiltrative mass cannot be excluded. 5. Vague soft tissue density tracks about the mesentery and more inferior retroperitoneum, worsened from the prior study, with new near complete effacement of the superior mesenteric vein, raising suspicion for compression by the underlying mass. 6. Mild-to-moderate right-sided hydronephrosis raises concern for partial right ureteral obstruction at the lower abdomen due to the underlying infiltrating mass. 7. Swirling of the superior mesenteric artery and  vein, concerning for some degree of twisting of small bowel about the vague mass along the mesenteric axis, without evidence for bowel ischemia. 8. Additional scattered peritoneal masses tracking about the omentum at the left lower quadrant, and at the paracolic gutters bilaterally, suspicious for worsening diffuse peritoneal carcinomatosis. 9. Vague wall thickening along the ascending colon, of uncertain significance. Underlying infiltrative mass cannot be excluded. 10. Diffuse periportal edema noted. Common bile duct stent noted in expected position. Trace ascites noted about the liver. 11. 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 Associated diffuse filling of collateral vessels about the chest wall and neck. 12. Mild degenerative change at the lower cervical spine. Chronic osseous fusion at L4-L5, and degenerative change along the lumbar spine. Degenerative change at the right hip. 13. Diffuse aortic atherosclerosis. 14. Scattered diverticulosis along the proximal sigmoid colon, without evidence of diverticulitis. 15. Enlarged prostate noted. These results were called by telephone at the time of interpretation on 05/28/2016 at 11:08 pm to Dr. Brantley Stage, who verbally acknowledged these results. Electronically Signed   By: Garald Balding M.D.   On: 06/18/2016 23:10   Dg Chest Portable 1 View  Result Date: 06/06/2016 CLINICAL DATA:  Acute onset of generalized weakness and shortness of breath. Generalized chest tightness and dry mouth. Initial encounter. EXAM: PORTABLE CHEST 1 VIEW COMPARISON:  Chest radiograph and CTA of the chest performed earlier today at 10:14 p.m. FINDINGS: The lungs are well-aerated. Worsening patchy bilateral airspace opacification is noted. This raises suspicion for pulmonary edema, given interval change since the prior CTA, and worsening interstitial prominence. No pleural effusion or pneumothorax is seen. The cardiomediastinal silhouette  is normal in size. No acute osseous abnormalities are identified. A left-sided chest port is noted ending about the distal SVC. IMPRESSION: Worsening patchy bilateral airspace opacification is suspicious for pulmonary edema. Pneumonia is considered less likely but remains a possibility. Electronically Signed   By: Garald Balding M.D.   On: 05/31/2016 23:38    Assessment: 81 y.o. AAM, with pancreatic adenocarcinoma, who developed peritoneal metastasis in [place] 2018, recently started palliative chemotherapy. He was admitted for septic shock.  1. Septic shock  2. Respiratory failure 3. Acute cholecystitis 4. Metastatic pancreatic adenocarcinoma 5. Anemia and thrombocytopenia  6. Malnutrition  Plan:  -I met pt's wife and son at bedside, gave them emotional support. I told them comfort care is very appropriate given his rapid cancer progression lately, and his personal wish of not having life support. The family are very comfortable about the decision -He is likely going to die in the hospital soon. If he is stable, family may wants to bring him home with hospice.  -pt appears to be comfortable -appreciate Dr. Hilma Favors and all other providers and nurses excellent care and support.    Truitt Merle, MD 06/15/2016  8:49 PM

## 2016-06-15 NOTE — Consult Note (Signed)
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 Perc drain place this AM. He is currently critically ill in the ICU.  Clinical Assessment and Goals of Care: I met with the patient, his son and his wife to discuss goals-they understand the poor prognosis and ant comfort dignity and to allow patient to return home for EOL care.    SUMMARY OF RECOMMENDATIONS   1. DNR 2. Transfer to 6N for transition point-will need opiods and wants to continue IV antibiotics for at least another 24 hours 3. Make hospice referral-he wants to die at home 4. He wants better pain control- he is very stoic-does not have opioid currently 5. Allow for sleep-start remeron and diazepam at night 6. He is a fall risk and will need supervision 7. Resume regular diet 8. Dc tele and unnecessary lab work.  Will use IV fentanyl q1 PRN then determine 24 hour requirements and transition to a transdermal.   Prognosis:   < 2 weeks  Discharge Planning: Home with Hospice      Primary Diagnoses: Present on Admission: . Sepsis ([place]) . Malignant neoplasm of body of pancreas ([place]) . Acute respiratory failure with hypoxia ([place])   I have reviewed the medical record, interviewed the patient and family, and examined the patient. The following aspects are pertinent.  Past Medical History:  Diagnosis Date  .  Arthritis    R hip and shoulder  . Complication of anesthesia    Local anesthetic used in office for Prostate biopsy" reaction Tachycardia,nausea, hallucinations, Blood pressure elevated"- No problems once done at hospital with anesthesia" Thinks Ciprofloxacin was the injection"   . Diabetes mellitus   . Enlarged prostate   . Femoral bruit    R femoral  . GERD with stricture    PMH of  . History of kidney stones   . Hyperlipidemia   . Hypertension   . Macular degeneration    glaucoma suspect  . Pancreatic cancer ([place]) 09/2015   adenocarcinoma of pancreas  . PONV (postoperative nausea and vomiting)   . Prostate cancer [facility])    prostate ; Dr Wyvonnia Lora to be monitored x 5 yrs now.  . Squamous papilloma    of esophagus   Social History   Social History  . Marital status: Married    Spouse name: Deloris  . Number of children: 1  . Years of education: N/A   Occupational History  . Retired     Ecologist in [place]  . Smoking status: Former Smoker  Packs/day: 0.50    Years: 40.00    Types: Cigarettes    Quit date: 03/27/1989  . Smokeless tobacco: Never Used     Comment: smoked [place], up to 1 ppd  . Alcohol use Yes     Comment: social  . Drug use: No  . Sexual activity: Not Asked   Other Topics Concern  . None   Social History Narrative   Married to wife, Deloris for 54+ years   Retired here from [employer] in [employer]   Enjoys outdoor activities and bikes 2 miles/day   Has one grown son and teen granddaughter in area   Family History  Problem Relation Age of Onset  . Kidney disease Father     ? Rheumatic fever as child  . Hypertension Father   . Heart attack Paternal Uncle     in 56s  . COPD Paternal Uncle     due to exposures in [facility]  . Kidney disease Paternal Aunt     ? Rheumatic fever as child  . Cancer Neg Hx   . Diabetes Neg Hx   . Stroke Neg Hx   . Colon cancer Neg Hx    Scheduled  Meds: . fentaNYL      . latanoprost  1 drop Both Eyes QHS  . lidocaine (PF)      . meropenem (MERREM) IV  1 g Intravenous Q12H  . vancomycin  500 mg Intravenous Q24H   Continuous Infusions: PRN Meds:.acetaminophen **OR** acetaminophen, antiseptic oral rinse, LORazepam Medications Prior to Admission:  Prior to Admission medications   Medication Sig Start Date End Date Taking? Authorizing Provider  amLODipine (NORVASC) 10 MG tablet Take 1 tablet (10 mg total) by mouth daily. 11/01/15  Yes Binnie Rail, MD  aspirin 81 MG tablet Take 1 tablet (81 mg total) by mouth daily. 03/31/12  Yes Festus Aloe, MD  doxazosin (CARDURA) 1 MG tablet TAKE 1 TABLET BY MOUTH EVERY DAY 09/30/15  Yes Binnie Rail, MD  latanoprost (XALATAN) 0.005 % ophthalmic solution Place 1 drop into both eyes at bedtime. 05/25/15  Yes Historical Provider, MD  lidocaine-prilocaine (EMLA) cream Apply 1 application topically as needed. Apply to pac site 1 hour prior to stick and cover with plastic wrap Patient taking differently: Apply 1 application topically See admin instructions. Apply to pac site 1 hour prior to stick and cover with plastic wrap - approximately once a month 10/12/15  Yes Truitt Merle, MD  loperamide (IMODIUM) 2 MG capsule Take 4 mg by mouth as needed for diarrhea or loose stools.   Yes Historical Provider, MD  metFORMIN (GLUCOPHAGE) 500 MG tablet Take 1 tablet (500 mg total) by mouth 2 (two) times daily with a meal. 10/07/15  Yes Binnie Rail, MD  multivitamin-lutein [facility]) CAPS Take 1 capsule by mouth 2 (two) times daily.    Yes Historical Provider, MD  naproxen sodium (ALEVE) 220 MG tablet Take 220 mg by mouth daily as needed (pain/headache).   Yes Historical Provider, MD  OVER THE COUNTER MEDICATION Place 1 drop into both eyes daily. Over the counter lubricating eye drops   Yes Historical Provider, MD  oxyCODONE (OXY IR/ROXICODONE) 5 MG immediate release tablet 1-2 tabs PO Q 6 hours PRN pain Patient taking  differently: Take 5-10 mg by mouth every 6 (six) hours as needed for moderate pain.  06/05/16  Yes Susanne Borders, NP  potassium chloride 20 MEQ/15ML (10%) SOLN Take 15 mLs (20  mEq total) by mouth daily. 05/26/16  Yes Dron Tanna Furry, MD  pravastatin (PRAVACHOL) 20 MG tablet Take 1 tablet (20 mg total) by mouth at bedtime. 11/01/15  Yes Binnie Rail, MD  hydrochlorothiazide (MICROZIDE) 12.5 MG capsule Take 1 capsule (12.5 mg total) by mouth daily. Patient not taking: Reported on 06/17/2016 10/11/15   Binnie Rail, MD  HYDROcodone-acetaminophen (NORCO) 5-325 MG tablet Take 1 tablet by mouth every 6 (six) hours as needed for moderate pain. Patient not taking: Reported on 06/05/2016 05/26/16   Dron Tanna Furry, MD  [facility] DELICA LANCETS 35K MISC Use to check blood sugars once a day Dx E11.9 Patient not taking: Reported on 06/13/2016 09/27/15   Binnie Rail, MD  ONETOUCH VERIO test strip CHECK BLOOD SUGAR ONCE DAILY. DX250.00 Patient not taking: Reported on 06/13/2016 11/23/14   Hendricks Limes, MD  traMADol (ULTRAM) 50 MG tablet Take 0.5 tablets (25 mg total) by mouth every 6 (six) hours as needed. Patient not taking: Reported on 06/10/2016 05/23/16   Truitt Merle, MD   Allergies  Allergen Reactions  . Ace Inhibitors Swelling and Other (See Comments)    Swelling of lips - reaction to Benazepril  . Angiotensin Receptor Blockers Other (See Comments)    These are contraindicated because of angioedema with ACE inhibitors.  . Ciprofloxacin Anaphylaxis and Other (See Comments)    REACTION: anorexia , weakness, blurred vision   Review of Systems  Physical Exam  Vital Signs: BP (!) 77/52   Pulse 100   Temp (!) 96.8 F (36 C) (Axillary)   Resp (!) 29   Ht '5\' 10"'$  (1.778 m)   Wt 56.7 kg (125 lb)   SpO2 (!) 86%   BMI 17.94 kg/m  Pain Assessment: No/denies pain   Pain Score: 0-No pain   SpO2: SpO2: (!) 86 % O2 Device:SpO2: (!) 86 % O2 Flow Rate: .O2 Flow Rate (L/min): 12 L/min  IO:  Intake/output summary:  Intake/Output Summary (Last 24 hours) at 06/15/16 1526 Last data filed at 06/15/16 0600  Gross per 24 hour  Intake             2300 ml  Output              250 ml  Net             2050 ml    LBM: Last BM Date: 06/15/16 Baseline Weight: Weight: 54.4 kg (120 lb) Most recent weight: Weight: 56.7 kg (125 lb)     Palliative Assessment/Data:      Time Total: 70 min Greater than 50%  of this time was spent counseling and coordinating care related to the above assessment and plan.  Signed by: Lane Hacker, DO   Please contact Palliative Medicine Team phone at [phone] for questions and concerns.  For individual provider: See Shea Evans

## 2016-06-15 NOTE — ED Notes (Signed)
Pt started c/o [facility], was put back on BiPAP.

## 2016-06-15 NOTE — Progress Notes (Signed)
Work of breathing is worsening, pt is restless and uncomfortable in bed. Paged MD and received order for Ativan 0.5mg  PRN, given. Also, contacted palliative care for consultation and informed them the patients condition.   Gershon Crane

## 2016-06-16 MED ORDER — SODIUM CHLORIDE 0.9 % IV SOLN
20.0000 ug/h | INTRAVENOUS | Status: DC
  Filled 2016-06-16: qty 50

## 2016-06-16 MED ORDER — MIDAZOLAM HCL 2 MG/2ML IJ SOLN
1.0000 mg | INTRAMUSCULAR | Status: AC
  Filled 2016-06-16: qty 2

## 2016-06-16 MED ORDER — WHITE PETROLATUM GEL
Status: AC
  Filled 2016-06-16: qty 1

## 2016-06-16 MED ORDER — GLYCOPYRROLATE 0.2 MG/ML IJ SOLN
0.2000 mg | INTRAMUSCULAR | Status: DC | PRN
  Administered 2016-06-16 – 2016-06-17 (×4): 0.2 mg via INTRAVENOUS
  Filled 2016-06-16 (×4): qty 1

## 2016-06-16 MED ORDER — FENTANYL BOLUS VIA INFUSION
20.0000 ug | INTRAVENOUS | Status: DC | PRN
  Administered 2016-06-16: 20 ug via INTRAVENOUS
  Filled 2016-06-16: qty 20

## 2016-06-16 MED ORDER — SODIUM CHLORIDE 0.9 % IV SOLN
INTRAVENOUS | Status: DC
  Administered 2016-06-16: 13:00:00 via INTRAVENOUS

## 2016-06-16 MED ORDER — FENTANYL BOLUS VIA INFUSION
50.0000 ug | INTRAVENOUS | Status: DC | PRN
  Administered 2016-06-16 – 2016-06-17 (×10): 50 ug via INTRAVENOUS
  Filled 2016-06-16: qty 50

## 2016-06-16 MED ORDER — FENTANYL 2500MCG IN NS 250ML (10MCG/ML) PREMIX INFUSION
100.0000 ug/h | INTRAVENOUS | Status: DC
  Administered 2016-06-16: 20 ug/h via INTRAVENOUS
  Administered 2016-06-17: 75 ug/h via INTRAVENOUS
  Filled 2016-06-16 (×2): qty 250

## 2016-06-16 MED ORDER — SODIUM CHLORIDE 0.9% FLUSH: 10.0000 mL | INTRAVENOUS | Status: DC | PRN

## 2016-06-16 MED ORDER — GLYCOPYRROLATE 0.2 MG/ML IJ SOLN: 0.2000 mg | INTRAMUSCULAR | Status: DC | PRN

## 2016-06-16 MED ORDER — GLYCOPYRROLATE 1 MG PO TABS
1.0000 mg | ORAL_TABLET | ORAL | Status: DC | PRN
Start: 2016-06-16 — End: 2016-06-16
  Filled 2016-06-16: qty 1

## 2016-06-16 MED ORDER — MIDAZOLAM HCL 5 MG/ML IJ SOLN
2.0000 mg/h | INTRAMUSCULAR | Status: DC
  Administered 2016-06-16: 1.5 mg/h via INTRAVENOUS
  Administered 2016-06-16: 1 mg/h via INTRAVENOUS
  Administered 2016-06-18 – 2016-06-19 (×2): 2 mg/h via INTRAVENOUS
  Filled 2016-06-16 (×4): qty 10

## 2016-06-16 MED ORDER — MIDAZOLAM BOLUS VIA INFUSION
1.0000 mg | INTRAVENOUS | Status: DC | PRN
  Administered 2016-06-16 – 2016-06-17 (×2): 1 mg via INTRAVENOUS
  Filled 2016-06-16 (×2): qty 1

## 2016-06-16 NOTE — Progress Notes (Signed)
[facility] Daily Progress Note:   Subjective: [name]  is a 81 y.o. male admitted for severe sepsis due to E. coli bacteremia due to cholecystitis, and possible pneumonia. He is frail with a history of pancreatic CA on chemotherapy and obstructive jaundice s/p biliary stent placement 3 weeks ago and presented with fever, chills, loose stools and epigastric/RUQ abdominal pain. On presentation he was tachycardic and hypotensive with initial lactic acid of 10. CT abd/pelvis and CTA chest showed evidence of cholecystitis and possible pneumonia with progression of pancreatic mass. IVF's and empiric antibiotics were provided, though he developed worsening dyspnea and repeat CXR showed pulmonary edema so fluids were stopped. He is not a candidate for surgery, so IR placed a perc drain 3/22. BP remained low and he had required NIPPV. After discussions with palliative care, the patient and family agreed that comfort should be the primary goal of care. Fentanyl and ativan were provided for comfort, and he was transferred to 6N. Antibiotics have been discontinued, and the patient pulled the biliary drain out 3/22. He has had intermittent agitation overnight requiring escalating doses of fentanyl and ativan, so this morning a fentanyl infusion was started. Due to ongoing agitation, versed infusion was started and has required titration upwards to manage symptoms. He continues to breathe rapidly and shallow, but appears to be in no distress on my exam this morning. Palliative care has been providing very valuable care to this patient and family.   Objective: BP 101/50  Pulse (!) 103   Temp 97.36F  Resp 25  SpO2 90% Gen: Pt sleeping quietly, no distress Resp: Upper airway secretions noted without lower crackles/wheezes. O2 in place.  GI: dressing on right abdomen c/d/i  Assessment & Plan: Severe sepsis due to E. coli bacteremia from cholecystitis: Not surgical candidate, got perc drain, but pulled it  out 3/22. Desires comfort care in stead of intubation and pressors.  - DC'ed fluids, abx, monitor. - Ok to leave catheter in per IR  Acute delirium: Due to severe illness, sepsis, acute respiratory failure with hypoxemia, hospitalization and sedating medications - Continue delirium precautions, family at bedside - Versed gtt  Acute respiratory failure: Due to sepsis, pulmonary edema.  - Continue oxygen for comfort - Fentanyl gtt for air hunger and comfort  Pancreatic CA:  - Dr. Burr Medico added to treatment team and came by to visit patient and family 3/22.   Vance Gather, MD Pager [phone]  06/16/2016 3:49 PM

## 2016-06-16 NOTE — Progress Notes (Signed)
Palliative Medicine RN Note: Discussed pt with Drs Hilma Favors and Bonner Puna.   Pt is non responsive, cachectic, warm, RR approximately 40 despite fentanyl gtt and fentanyl push 10 minutes before my assessment. No other s/s pain. Mild secretions noted.  Discussed pt status with wife and son at bedside, as well as plan for therapies/treatments/medications. They verbalized understanding.    I called Dr Hilma Favors to discussed increased work of breathing. She ordered an increase in fentanyl to 50 mcg/hour and 50 mcg for breakthrough, and she will see the patient later today.  Left my contact information with family in case of questions. Will follow up again before I leave this afternoon.  Marjie Skiff Bridget Westbrooks, RN, BSN, [facility] 06/16/2016 9:51 AM Cell [phone] 8:00-4:00 Monday-Friday Office [phone]

## 2016-06-16 NOTE — Progress Notes (Signed)
Palliative Medicine RN Note: Checked on pt. He is breathing about 35 times a minute, very shallow. Has gotten several prn boluses of fentanyl in the past 3 hours, and the family states he has been agitated. Spoke with Dr Hilma Favors and obtained orders to increase versed and fentanyl drips. PMT will follow over the weekend for symptoms.  Marjie Skiff Gisela Lea, RN, BSN, [facility] 06/16/2016 2:32 PM Cell [phone] 8:00-4:00 Monday-Friday Office [phone]

## 2016-06-16 NOTE — Progress Notes (Signed)
Note percutaneous cholecystostomy drain was pulled by patient overnight. PA assessed this AM.  Patient has transitioned to comfort care. Drain catheter was left in place, clamped, and covered with dressing per Dr. Pascal Lux instructions.  Continue current management as this is a fresh tract.   Brynda Greathouse, MS RD PA-C 12:52 PM

## 2016-06-16 NOTE — Progress Notes (Signed)
Palliative Medicine RN Note: Pt becoming more agitated; spoke again w Dr Hilma Favors. She ordered a Versed gtt with a stat dose of Versed. Spoke with RN Drue Dun.   Marjie Skiff Prajna Vanderpool, RN, BSN, [facility] 06/16/2016 10:05 AM Cell [phone] 8:00-4:00 Monday-Friday Office [phone]

## 2016-06-16 NOTE — Progress Notes (Signed)
Patients wife requested no shift assessment be done at this time. She also requested that at this time patient not be turned. She stated "he finally looks comfortable, lets just leave him be for now".  Will continue to monitor.  Jimmie Molly, RN

## 2016-06-16 NOTE — Progress Notes (Signed)
Nutrition Brief Note  Chart reviewed. Pt now transitioning to comfort care.  No further nutrition interventions warranted at this time.  Please re-consult as needed.   Leya Paige A. Elier Zellars, RD, LDN, CDE Pager: [phone] After hours Pager: [phone]  

## 2016-06-17 DIAGNOSIS — J181 Lobar pneumonia, unspecified organism: Secondary | ICD-10-CM

## 2016-06-17 DIAGNOSIS — Z515 Encounter for palliative care: Secondary | ICD-10-CM

## 2016-06-17 MED ORDER — MIDAZOLAM BOLUS VIA INFUSION
2.0000 mg | INTRAVENOUS | Status: DC | PRN
  Administered 2016-06-17 (×3): 2 mg via INTRAVENOUS
  Filled 2016-06-17: qty 2

## 2016-06-17 MED ORDER — FENTANYL BOLUS VIA INFUSION
75.0000 ug | INTRAVENOUS | Status: DC | PRN
  Administered 2016-06-17 (×3): 75 ug via INTRAVENOUS
  Filled 2016-06-17: qty 75

## 2016-06-17 MED ORDER — HYDROMORPHONE BOLUS VIA INFUSION
1.0000 mg | INTRAVENOUS | Status: DC | PRN
  Administered 2016-06-19 (×2): 1 mg via INTRAVENOUS
  Filled 2016-06-17: qty 1

## 2016-06-17 MED ORDER — SODIUM CHLORIDE 0.9 % IV SOLN
1.0000 mg/h | INTRAVENOUS | Status: DC
  Administered 2016-06-17 – 2016-06-19 (×3): 1 mg/h via INTRAVENOUS
  Filled 2016-06-17 (×3): qty 2.5

## 2016-06-17 NOTE — Progress Notes (Addendum)
Shift assessment complete. Patient resting comfortably, unresponsive to voice/touch, family at bedside. Respirations even and unlabored @ 17, lungs cta. HRR. No acute distress noted. Mouth care complete. Continuous infusions remain to left medi port as per order.

## 2016-06-17 NOTE — Progress Notes (Signed)
[facility] Daily Progress Note:   Subjective: [name]  is a 81 y.o. male admitted for severe sepsis due to E. coli bacteremia due to cholecystitis, and possible pneumonia. He is frail with a history of pancreatic CA on chemotherapy and obstructive jaundice s/p biliary stent placement 3 weeks ago and presented with fever, chills, loose stools and epigastric/RUQ abdominal pain. On presentation he was tachycardic and hypotensive with initial lactic acid of 10. CT abd/pelvis and CTA chest showed evidence of cholecystitis and possible pneumonia with progression of pancreatic mass. IVF's and empiric antibiotics were provided, though he developed worsening dyspnea and repeat CXR showed pulmonary edema so fluids were stopped. He is not a candidate for surgery, so IR placed a perc drain 3/22. BP remained low and he had required NIPPV. After discussions with palliative care, the patient and family agreed that comfort should be the primary goal of care. Fentanyl and ativan were provided for comfort, and he was transferred to 6N. Antibiotics have been discontinued, and the patient pulled the biliary drain out 3/22. He has had intermittent agitation overnight requiring escalating doses of fentanyl and ativan, so this morning a fentanyl infusion was started. Due to ongoing agitation, versed infusion was started and has required titration upwards to manage symptoms. Palliative care has been providing very valuable care to this patient and family. Family reports uneventful evening, concerned about bed sores if he's not turned. Agitation significantly improved, but has been breathing more labored in the past hour or so.  Objective: BP 101/50  Pulse 103   Temp 97.65F  Resp 25  SpO2 90% (no new VS documented today) Gen: Pt in no acute distress, resting with mouth wide open Resp: Accessory muscle use noted. Upper airway secretions noted without lower crackles/wheezes. O2 in place.  GI: dressing on right abdomen  c/d/I  Versed gtt at 1.5mg /hr, recently received bolus dose Fentanyl gtt at 76mcg/hr, recently received bolus dose  Assessment & Plan: Comfort care: Palliative care team assistance is appreciated. Anticipate hospital death. - Versed gtt at 1.5mg /hr with good control of agitation - Fentanyl gtt at 27mcg/hr, will discuss with palliative team titrating upward given labored breathing - Robinul ordered - Discussed with family that bed sores are not an issue, that turning q2h if he is uncomfortable is indicated for comfort. - Wife, son, and family friend at bedside.   Severe sepsis due to E. coli bacteremia from cholecystitis: Not surgical candidate, got perc drain, but pulled it out 3/22. Desires comfort care in stead of intubation and pressors.  - DC'ed fluids, abx, monitor. - Ok to leave catheter in per IR  Acute delirium: Due to severe illness, sepsis, acute respiratory failure with hypoxemia, hospitalization and sedating medications - Continue delirium precautions, family at bedside - Versed gtt  Acute respiratory failure: Due to sepsis, pulmonary edema.  - Continue oxygen for comfort - Fentanyl gtt for air hunger and comfort  Pancreatic CA:  - Dr. Burr Medico added to treatment team and came by to visit patient and family 3/22.   Vance Gather, MD Pager [phone]  06/17/2016 11:20 AM

## 2016-06-17 NOTE — Progress Notes (Signed)
Wasted 155 ml from remaining fentanyl drip in med room witnessed by  [employer].

## 2016-06-17 NOTE — Progress Notes (Signed)
Patient has been unchanged since this morning with high respirations and high pulse despite boluses and increase in drip rate. I notified Sarah B., NP that we may need to try something else or increase rate. She said she would look into his chart and make some adjustments to his medicine.

## 2016-06-17 NOTE — Progress Notes (Signed)
Daily Progress Note   Patient Name: MARSH HECKLER       Date: 06/17/2016 DOB: [birth year]  Age: 81 y.o. MRN#: [id number] Attending Physician: Patrecia Pour, MD Primary Care Physician: Binnie Rail, MD Admit Date: 06/24/2016  Reason for Consultation/Follow-up: Non pain symptom management, Pain control, Psychosocial/spiritual support and Terminal Care  Subjective: Patient is unresponsive to voice or touch. He does have increased work of breathing, 28 times per minute. No apnea observed;. faint mottling to knees. Wife is at the bedside and prepared for death to occur between hours and days  Length of Stay: 2  Current Medications: Scheduled Meds:  . latanoprost  1 drop Both Eyes QHS  . mouth rinse  15 mL Mouth Rinse BID    Continuous Infusions: . sodium chloride 10 mL/hr at 06/16/16 1323  . fentaNYL 100 mcg/hr (06/17/16 1156)  . midazolam (VERSED) infusion 2 mg/hr (06/17/16 1156)    PRN Meds: antiseptic oral rinse, fentaNYL, [DISCONTINUED] glycopyrrolate **OR** [DISCONTINUED] glycopyrrolate **OR** glycopyrrolate, midazolam, sodium chloride flush  Physical Exam  Constitutional:  Cachectic, acutely ill man. Appears to be actively dying  HENT:  Head: Normocephalic and atraumatic.  Pulmonary/Chest:  Increased work of breathing  Neurological:  Unresponsive to voice and touch  Skin:  [place], mottling noted to knees  Nursing note and vitals reviewed.           Vital Signs: BP (!) 101/50 (BP Location: Right Arm)   Pulse (!) 103   Temp 97.9 F (36.6 C) (Axillary)   Resp (!) 25   Ht 5\' 10"  (1.778 m)   Wt 56.7 kg (125 lb)   SpO2 90%   BMI 17.94 kg/m  SpO2: SpO2: 90 % O2 Device: O2 Device: High Flow Nasal Cannula O2 Flow Rate: O2 Flow Rate (L/min): 15 L/min  Intake/output  summary:  Intake/Output Summary (Last 24 hours) at 06/17/16 1313 Last data filed at 06/17/16 0300  Gross per 24 hour  Intake           276.82 ml  Output              300 ml  Net           -23.18 ml   LBM: Last BM Date: 06/15/16 Baseline Weight: Weight: 54.4 kg (120 lb) Most recent weight: Weight: 56.7 kg (  125 lb)       Palliative Assessment/Data:    Flowsheet Rows     Most Recent Value  Intake Tab  Referral Department  Hospitalist  Unit at Time of Referral  ER  Palliative Care Primary Diagnosis  Cancer  Date Notified  06/15/16  Palliative Care Type  New Palliative care  Reason for referral  Clarify Goals of Care  Date of Admission  05/31/2016  Date first seen by Palliative Care  06/15/16  # of days Palliative referral response time  0 Day(s)  # of days IP prior to Palliative referral  1  Clinical Assessment  Psychosocial & Spiritual Assessment  Palliative Care Outcomes      Patient Active Problem List   Diagnosis Date Noted  . Sepsis ([place]) 06/15/2016  . Acute respiratory failure with hypoxia ([place]) 06/15/2016  . Acute cholecystitis   . Dehydration 06/05/2016  . Post-op pain   . Acute pancreatitis   . Biliary obstruction   . Elevated LFTs   . Abnormal CT scan, gastrointestinal tract   . Port catheter in place 04/25/2016  . Bilateral leg edema 12/01/2015  . Malignant neoplasm of body of pancreas ([place]) 10/07/2015  . AP (abdominal pain) 09/07/2015  . Glaucoma 06/17/2015  . Macular degeneration 06/17/2015  . Dysphagia, pharyngoesophageal phase 09/23/2014  . Hypokalemia 03/17/2014  . ANGIOEDEMA 12/22/2009  . PROSTATE CANCER, HX OF 12/22/2009  . Hyperlipidemia 04/13/2009  . DIVERTICULOSIS, COLON 10/28/2007  . COLONIC POLYPS, HX OF 10/28/2007  . Diabetes type 2, controlled ([place]) 11/22/2006  . Essential hypertension 09/06/2006    Palliative Care Assessment & Plan   Patient Profile:  GILMORE LIST  is a 81 y.o. male admitted for severe sepsis due to E. coli  bacteremia due to cholecystitis, and possible pneumonia. He is frail with a history of pancreatic CA on chemotherapy and obstructive jaundice s/p biliary stent placement 3 weeks ago and presented with fever, chills, loose stools and epigastric/RUQ abdominal pain. On presentation he was tachycardic and hypotensive with initial lactic acid of 10. CT abd/pelvis and CTA chest showed evidence of cholecystitis and possible pneumonia with progression of pancreatic mass. IVF's and empiric antibiotics were provided, though he developed worsening dyspnea and repeat CXR showed pulmonary edema so fluids were stopped. He is not a candidate for surgery, so IR placed a perc drain 3/22. BP remained low and he had required NIPPV. After discussions with palliative care, the patient and family agreed that comfort should be the primary goal of care. Fentanyl and ativan were provided for comfort, and he was transferred to 6N. Antibiotics have been discontinued, and the patient pulled the biliary drain out 3/22. He has had intermittent agitation overnight requiring escalating doses of fentanyl and ativan, so this morning a fentanyl infusion was started. Due to ongoing agitation, versed infusion was started and has required titration upwards to manage symptoms. Palliative care has been providing very valuable care to this patient and family. Family reports uneventful evening, concerned about bed sores if he's not turned. Agitation significantly improved, but has been breathing more labored in the past hour or so.   Recommendations/Plan:  Dyspnea: We'll uptitrate fentanyl continuous infusion 200 g an hour and 75 g every 15 minutes as needed  Pain: See above recommendations  Terminal agitation: Improving. We'll uptitrate first said continuous infusion to 2 mg an hour and 2 mg every 30 minutes as needed  Secretions: Continue with Robinul on an as-needed basis. Monitor for need for scheduled dosing  Goals of Care and  Additional Recommendations:  Limitations on Scope of Treatment: Full Comfort Care  Code Status:    Code Status Orders        Start     Ordered   06/16/16 0758  Do not attempt resuscitation (DNR)  Continuous    Question Answer Comment  In the event of cardiac or respiratory ARREST Do not call a "code blue"   In the event of cardiac or respiratory ARREST Do not perform Intubation, CPR, defibrillation or ACLS   In the event of cardiac or respiratory ARREST Use medication by any route, position, wound care, and other measures to relive pain and suffering. May use oxygen, suction and manual treatment of airway obstruction as needed for comfort.      06/16/16 0759    Code Status History    Date Active Date Inactive Code Status Order ID Comments User Context   06/15/2016  3:27 AM 06/16/2016  7:59 AM DNR [id number]  Rise Patience, MD ED   06/15/2016 12:09 AM 06/15/2016  3:27 AM DNR [id number]  Forde Dandy, MD ED   05/25/2016  4:43 PM 05/26/2016  7:42 PM Full Code [id number]  Dron Tanna Furry, MD Inpatient       Prognosis:   Hours - Days  Discharge Planning:  Anticipated Hospital Death  Care plan was discussed with Dr. Bonner Puna  Thank you for allowing the Palliative Medicine Team to assist in the care of this patient.   Time In: 0945 Time Out: 1025 Total Time 40 min Prolonged Time Billed  no       Greater than 50%  of this time was spent counseling and coordinating care related to the above assessment and plan.  Dory Horn, NP  Please contact Palliative Medicine Team phone at [phone] for questions and concerns.

## 2016-06-17 NOTE — Progress Notes (Signed)
Patient switched from fentanyl drip to dilaudid drip, family notified, will continue to monitor.

## 2016-06-17 NOTE — Progress Notes (Signed)
Patient noted to be increasing work of breathing around 30 RR. Gave bolus of fentanyl and told family it should take effect in max 30 min and if not changed we would try the bolus of Versed. Will continue to monitor.

## 2016-06-17 NOTE — Progress Notes (Signed)
Witnessed a waste of 165cc from Fentanyl drip bag

## 2016-06-17 NOTE — Progress Notes (Signed)
MD made adjustment to drips, I have updated the drip rate on both the fentanyl and versed, will continue to monitor.

## 2016-06-17 NOTE — Progress Notes (Signed)
Correction on previous entry. 155 cc wasted instead of 165 cc

## 2016-06-18 LAB — CULTURE, BLOOD (ROUTINE X 2)

## 2016-06-18 NOTE — Progress Notes (Signed)
Daily Progress Note   Patient Name: [name]       Date: 06/18/2016 DOB: 11-22-1935  Age: 81 y.o. MRN#: [id number] Attending Physician: Patrecia Pour, MD Primary Care Physician: Binnie Rail, MD Admit Date: 05/25/2016  Reason for Consultation/Follow-up: Non pain symptom management, Pain control, Psychosocial/spiritual support and Terminal Care  Subjective: Patient's respirations are less labored on hydromorphone continuous infusion at 1 mg an hour. There have been no boluses noted overnight. No apnea observed, respiratory rate 18/m. Patient is actively dying. Wife is at the bedside and is prepared for death to be at any time  Length of Stay: 3  Current Medications: Scheduled Meds:  . latanoprost  1 drop Both Eyes QHS  . mouth rinse  15 mL Mouth Rinse BID    Continuous Infusions: . sodium chloride 10 mL/hr at 06/16/16 1323  . HYDROmorphone 1 mg/hr (06/18/16 0944)  . midazolam (VERSED) infusion 2 mg/hr (06/18/16 0037)    PRN Meds: antiseptic oral rinse, [DISCONTINUED] glycopyrrolate **OR** [DISCONTINUED] glycopyrrolate **OR** glycopyrrolate, HYDROmorphone, midazolam, sodium chloride flush  Physical Exam  Constitutional:  Cachectic older man. Appears to be actively dying  HENT:  Head: Normocephalic and atraumatic.  Pulmonary/Chest:  Respiratory rate 18/m No apnea observed  Neurological:  Unresponsive to voice and touch  Skin:  Cool, mottled  Nursing note and vitals reviewed.           Vital Signs: BP (!) 61/32 (BP Location: Left Wrist)   Pulse 97   Temp 98.4 F (36.9 C) (Axillary)   Resp 14   Ht 5\' 10"  (1.778 m)   Wt 56.7 kg (125 lb)   SpO2 91%   BMI 17.94 kg/m  SpO2: SpO2: 91 % O2 Device: O2 Device: High Flow Nasal Cannula O2 Flow Rate: O2 Flow Rate (L/min):  15 L/min  Intake/output summary:  Intake/Output Summary (Last 24 hours) at 06/18/16 1043 Last data filed at 06/17/16 2109  Gross per 24 hour  Intake                0 ml  Output              100 ml  Net             -100 ml   LBM: Last BM Date: 06/15/16 Baseline Weight: Weight: 54.4  kg (120 lb) Most recent weight: Weight: 56.7 kg (125 lb)       Palliative Assessment/Data:    Flowsheet Rows     Most Recent Value  Intake Tab  Referral Department  Hospitalist  Unit at Time of Referral  ER  Palliative Care Primary Diagnosis  Cancer  Date Notified  06/15/16  Palliative Care Type  New Palliative care  Reason for referral  Clarify Goals of Care  Date of Admission  06/17/2016  Date first seen by Palliative Care  06/15/16  # of days Palliative referral response time  0 Day(s)  # of days IP prior to Palliative referral  1  Clinical Assessment  Psychosocial & Spiritual Assessment  Palliative Care Outcomes      Patient Active Problem List   Diagnosis Date Noted  . Palliative care encounter   . Sepsis ([place]) 06/15/2016  . Acute respiratory failure with hypoxia ([place]) 06/15/2016  . Acute cholecystitis   . Dehydration 06/05/2016  . Post-op pain   . Acute pancreatitis   . Biliary obstruction   . Elevated LFTs   . Abnormal CT scan, gastrointestinal tract   . Port catheter in place 04/25/2016  . Bilateral leg edema 12/01/2015  . Malignant neoplasm of body of pancreas ([place]) 10/07/2015  . AP (abdominal pain) 09/07/2015  . Glaucoma 06/17/2015  . Macular degeneration 06/17/2015  . Dysphagia, pharyngoesophageal phase 09/23/2014  . Hypokalemia 03/17/2014  . ANGIOEDEMA 12/22/2009  . PROSTATE CANCER, HX OF 12/22/2009  . Hyperlipidemia 04/13/2009  . DIVERTICULOSIS, COLON 10/28/2007  . COLONIC POLYPS, HX OF 10/28/2007  . Diabetes type 2, controlled ([place]) 11/22/2006  . Essential hypertension 09/06/2006    Palliative Care Assessment & Plan   Patient Profile:  [name]  is a 82  y.o. male admitted 06/15/2016 for severe sepsis due to E. coli bacteremia due to cholecystitis, and possible pneumonia. He is frail with a history of pancreatic CA on chemotherapy and obstructive jaundice s/p biliary stent placement 3 weeks prior to admission and presented with fever, chills, loose stools and epigastric/RUQ abdominal pain. On presentation he was tachycardic and hypotensive with initial lactic acid of 10. CT abd/pelvis and CTA chest showed evidence of cholecystitis and possible pneumonia with progression of pancreatic mass. IVF's and empiric antibiotics were provided, though he developed worsening dyspnea and repeat CXR showed pulmonary edema so fluids were stopped. He is not a candidate for surgery, so IR placed a perc drain 3/22. BP remained low and he had required NIPPV, so plan was discussed including intubation and pressors which the family and patient did not wish to pursue. After discussions with palliative care, the patient and family agreed that comfort should be the primary goal of care. Antibiotics were discontinue. Fentanyl and ativan were provided for dyspnea and anxiety, and he was transferred to 6N. The patient pulled the biliary drain out 3/22. He had intermittent agitation requiring infusion of benzodiazepine which was titrated to good effect. Fentanyl requirements steadily increased, requiring infusion. When breathing remained labored, this was transitioned to dilaudid infusion with bolus dosing with good effect. The patient remains calm in no distress, not responsive to verbal stimuli. His wife has remained at the bedside, reporting an uneventful evening. His breathing is much calmer and he has not been agitated.   Recommendations/Plan:  Patient is comfort care requiring IV medications to maintain comfort  Cont Dilaudid continuous infusion at 1 mg an hour with 1 mg every 30 minutes as needed for shortness of breath or  pain. We'll monitor breakthrough doses and titrate for  effect  Secretions: Continue Robinul as needed. Monitor for need for scheduled dosing but overall improved  Agitation: No agitation continue Haldol as needed.  Goals of Care and Additional Recommendations:  Limitations on Scope of Treatment: Full Comfort Care  Code Status:    Code Status Orders        Start     Ordered   06/16/16 0758  Do not attempt resuscitation (DNR)  Continuous    Question Answer Comment  In the event of cardiac or respiratory ARREST Do not call a "code blue"   In the event of cardiac or respiratory ARREST Do not perform Intubation, CPR, defibrillation or ACLS   In the event of cardiac or respiratory ARREST Use medication by any route, position, wound care, and other measures to relive pain and suffering. May use oxygen, suction and manual treatment of airway obstruction as needed for comfort.      06/16/16 0759    Code Status History    Date Active Date Inactive Code Status Order ID Comments User Context   06/15/2016  3:27 AM 06/16/2016  7:59 AM DNR [id number]  Rise Patience, MD ED   06/15/2016 12:09 AM 06/15/2016  3:27 AM DNR [id number]  Forde Dandy, MD ED   05/25/2016  4:43 PM 05/26/2016  7:42 PM Full Code [id number]  Dron Tanna Furry, MD Inpatient       Prognosis:   Hours - Days  Discharge Planning:  Anticipated Hospital Death   Thank you for allowing the Palliative Medicine Team to assist in the care of this patient.   Time In: 0800 Time Out: 0830 Total Time 30 min Prolonged Time Billed  no       Greater than 50%  of this time was spent counseling and coordinating care related to the above assessment and plan.  Dory Horn, NP  Please contact Palliative Medicine Team phone at [phone] for questions and concerns.

## 2016-06-18 NOTE — Progress Notes (Signed)
[facility] Daily Progress Note:   Subjective: [name]  is a 81 y.o. male admitted 06/15/2016 for severe sepsis due to E. coli bacteremia due to cholecystitis, and possible pneumonia. He is frail with a history of pancreatic CA on chemotherapy and obstructive jaundice s/p biliary stent placement 3 weeks prior to admission and presented with fever, chills, loose stools and epigastric/RUQ abdominal pain. On presentation he was tachycardic and hypotensive with initial lactic acid of 10. CT abd/pelvis and CTA chest showed evidence of cholecystitis and possible pneumonia with progression of pancreatic mass. IVF's and empiric antibiotics were provided, though he developed worsening dyspnea and repeat CXR showed pulmonary edema so fluids were stopped. He is not a candidate for surgery, so IR placed a perc drain 3/22. BP remained low and he had required NIPPV, so plan was discussed including intubation and pressors which the family and patient did not wish to pursue. After discussions with palliative care, the patient and family agreed that comfort should be the primary goal of care. Antibiotics were discontinue. Fentanyl and ativan were provided for dyspnea and anxiety, and he was transferred to 6N. The patient pulled the biliary drain out 3/22. He had intermittent agitation requiring infusion of benzodiazepine which was titrated to good effect. Fentanyl requirements steadily increased, requiring infusion. When breathing remained labored, this was transitioned to dilaudid infusion with bolus dosing with good effect. The patient remains calm in no distress, not responsive to verbal stimuli. His wife has remained at the bedside, reporting an uneventful evening. His breathing is much calmer and he has not been agitated.  Objective: BP 61/32 (Left Wrist)  HR 97   Temp 98.4 F (Axillary)   RR 14/min  SpO2 91% Gen: Pt in no acute distress, resting with mouth wide open Resp: Normal rate and effort.  Secretions improved with no crackles or wheezes.   GI: dressing on right abdomen c/d/I  Versed gtt at 2mg /hr  Dilaudid gtt at 1mg /hr  Assessment & Plan: Comfort care: The palliative care team has been providing very valuable care to this patient and family, and their assistance is appreciated. Anticipate hospital death. - Versed gtt at 2mg /hr with good control of agitation - Fentanyl gtt >> dilaudid gtt 3/24 due to concern that dyspnea was uncontrolled. Now improved.   - Robinul continue  - Turn pt q2h prn comfort. - Wife at bedside, all questions answered.   Severe sepsis due to E. coli bacteremia from cholecystitis: Not surgical candidate, got perc drain, but pulled it out 3/22. Desires comfort care in stead of intubation and pressors.  - DC'ed fluids, abx, monitor. - Ok to leave catheter in per IR  Acute delirium: Due to severe illness, sepsis, acute respiratory failure with hypoxemia, hospitalization and sedating medications - Continue delirium precautions, family at bedside - Versed gtt  Acute respiratory failure: Due to sepsis, pulmonary edema.  - Continue oxygen for comfort - Dilaudid gtt for air hunger and comfort  Pancreatic CA:  - Dr. Burr Medico added to treatment team and came by to visit patient and family 3/22.   Vance Gather, MD Pager [phone]  06/18/2016 10:08 AM

## 2016-06-20 ENCOUNTER — Encounter (HOSPITAL_COMMUNITY): Payer: Self-pay | Admitting: Interventional Radiology

## 2016-06-20 LAB — AEROBIC/ANAEROBIC CULTURE W GRAM STAIN (SURGICAL/DEEP WOUND)

## 2016-06-20 LAB — AEROBIC/ANAEROBIC CULTURE (SURGICAL/DEEP WOUND)

## 2016-06-25 NOTE — Progress Notes (Signed)
Wasted 30 ml of versed and 46 ml of hydromorpdone.

## 2016-06-25 NOTE — Progress Notes (Signed)
Called  To patient room. Noted no respiration, no HR, no BP. Another RN verified assessment. Md made aware of the death at 4. Palliative team came in to talk with the familily. Emotional supportt given. Md aware of the death. [place] [employer] referral done, patient is not a candidate of any organ donation.

## 2016-06-25 NOTE — Death Summary Note (Signed)
DEATH SUMMARY   Patient Details  Name: [name] MRN: [id number] DOB: Apr 23, 1935  Admission/Discharge Information   Admit Date:  June 24, 2016  Date of Death: Date of Death: 06/29/2016  Time of Death: Time of Death: 67  Length of Stay: 4  Referring Physician: Binnie Rail, MD   Reason(s) for Hospitalization  Sepsis  Diagnoses  Preliminary cause of death:  Secondary Diagnoses (including complications and co-morbidities):  Principal Problem:   Sepsis ([place]) Active Problems:   Diabetes type 2, controlled ([place])   Malignant neoplasm of body of pancreas ([place])   Acute respiratory failure with hypoxia [facility])   Palliative care encounter   [place] (including significant findings, care, treatment, and services provided and events leading to death)  [name] is a 81 y.o. year old male admitted 06/15/2016 for severe sepsis due to E. coli bacteremia due to cholecystitis, and possible pneumonia. He is frail with a history of pancreatic CA on chemotherapy and obstructive jaundice s/p biliary stent placement 3 weeks prior to admission and presented with fever, chills, loose stools and epigastric/RUQ abdominal pain. On presentation he was tachycardic and hypotensive with initial lactic acid of 10. CT abd/pelvis and CTA chest showed evidence of cholecystitis and possible pneumonia with progression of pancreatic mass. IVF's and empiric antibiotics were provided, though he developed worsening dyspnea and repeat CXR showed pulmonary edema so fluids were stopped. He is not a candidate for surgery, so IR placed a perc drain 3/22. BP remained low and he had required NIPPV, so plan was discussed including intubation and pressors which the family and patient did not wish to pursue. After discussions with palliative care, the patient and family agreed that comfort should be the primary goal of care. Antibiotics were discontinue. Fentanyl and ativan were provided for dyspnea and anxiety, and he  was transferred to 6N. The patient pulled the biliary drain out 3/22. He had intermittent agitation requiring infusion of benzodiazepine which was titrated to good effect. Fentanyl requirements steadily increased, requiring infusion. When breathing remained labored, this was transitioned to dilaudid infusion with bolus dosing with good effect. The patient remained calm in no distress until death.   Pertinent Labs and Studies  Significant Diagnostic Studies Dg Chest 2 View  Result Date: 2016/06/24 CLINICAL DATA:  Chest tightness EXAM: CHEST  2 VIEW COMPARISON:  05/25/16 FINDINGS: There is a left chest wall port a catheter with tip in the cavoatrial junction. Heart size appears normal. No pleural effusion or edema. No airspace opacities. The visualized osseous structures are unremarkable. IMPRESSION: No active cardiopulmonary disease. Electronically Signed   By: Kerby Moors M.D.   On: June 24, 2016 20:36   Ct Angio Chest Pe W And/or Wo Contrast  Result Date: 24-Jun-2016 CLINICAL DATA:  Acute onset of shortness of breath and chest tightness. Generalized abdominal pain. Loose stools. Current history of pancreatic cancer, on chemotherapy. Initial encounter. EXAM: CT ANGIOGRAPHY CHEST CT ABDOMEN AND PELVIS WITH CONTRAST TECHNIQUE: Multidetector CT imaging of the chest was performed using the standard protocol during bolus administration of intravenous contrast. Multiplanar CT image reconstructions and MIPs were obtained to evaluate the vascular anatomy. Multidetector CT imaging of the abdomen and pelvis was performed using the standard protocol during bolus administration of intravenous contrast. CONTRAST:  100 mL of Isovue 370 IV contrast COMPARISON:  CT of the chest, abdomen and pelvis performed 05/19/2016 FINDINGS: CTA CHEST FINDINGS Cardiovascular:  There is no evidence of pulmonary embolus. The heart is borderline normal in size. Scattered  calcification is noted along the aortic arch and descending thoracic  aorta. The great vessels are grossly unremarkable in appearance, though difficult to fully assess due to artifact from contrast and beam hardening artifact. Mediastinum/Nodes: The mediastinum is grossly unremarkable in appearance. No mediastinal lymphadenopathy is seen. Trace pericardial fluid remains within normal limits. The thyroid gland is not well characterized. No axillary lymphadenopathy is seen. A left-sided chest port is noted ending about the distal SVC. 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 Lungs/Pleura: Patchy bibasilar airspace opacities raise concern for mild infection. Mild fibrotic change and emphysematous change are noted bilaterally. No pleural effusion or pneumothorax is seen. No dominant mass is identified. Musculoskeletal: No acute osseous abnormalities are identified. Mild degenerative change is noted at the lower cervical spine. The visualized musculature is unremarkable in appearance. Review of the MIP images confirms the above findings. CT ABDOMEN and PELVIS FINDINGS Hepatobiliary: Diffuse periportal edema is noted within the liver. A common bile duct stent is seen in expected position. No definite intrahepatic biliary ductal dilatation is seen. There is significant gallbladder wall thickening, with marked distention of the gallbladder, concerning for acute cholecystitis. Trace ascites is seen tracking about the liver. Pancreas: The known pancreatic mass is difficult to fully characterize, with vague soft tissue tracking about the portal venous system and body of the pancreas. There is increasing wall thickening involving the antrum of the stomach. Underlying infiltration of mass cannot be excluded. Vague soft tissue density is seen tracking about the mesentery and underlying retroperitoneum more inferiorly. There is near complete effacement of the superior mesenteric vein, new from the prior  study and raising suspicion for compression by the underlying mass. Spleen: The spleen is unremarkable in appearance. Adrenals/Urinary Tract: The adrenal glands are unremarkable in appearance. There is mild-to-moderate right-sided hydronephrosis, raising suspicion for ureteral obstruction at the lower abdomen, likely reflecting underlying diffuse infiltrative mass. This is also new from the prior study. Scattered bilateral renal cysts are seen. Nonspecific perinephric stranding is noted bilaterally. No obstructing ureteral stones are identified. Stomach/Bowel: There is swirling of the superior mesenteric artery and vein, concerning for some degree of twisting of the small bowel about the mesenteric axis, without evidence for bowel ischemia at this time. As described above, there is near complete effacement of the superior mesenteric vein due to surrounding mass. Additional scattered peritoneal metastases are seen tracking about the omentum at the left lower quadrant, and along the paracolic gutters bilaterally, suspicious for worsening diffuse peritoneal carcinomatosis. The stomach is mildly distended with fluid and air. Vague wall thickening is noted along the ascending colon, of uncertain significance. Scattered diverticulosis is seen along the proximal sigmoid colon. Postoperative change is noted at the right lower quadrant and at the sigmoid colon. Vascular/Lymphatic: Diffuse calcification is seen along the abdominal aorta and its branches. The abdominal aorta is otherwise grossly unremarkable. The inferior vena cava is grossly unremarkable. No retroperitoneal lymphadenopathy is seen. No pelvic sidewall lymphadenopathy is identified. Reproductive: The bladder is relatively decompressed and not well assessed. The prostate is enlarged, measuring perhaps 6.1 cm in transverse dimension. Other: No additional soft tissue abnormalities are seen. Musculoskeletal: No acute osseous abnormalities are identified. The  patient left hip arthroplasty is grossly unremarkable in appearance. Degenerative change is noted at the right hip. There is chronic osseous fusion at L4-L5. Multilevel vacuum phenomenon is seen along the lumbar spine. The visualized musculature is unremarkable in appearance. Review  of the MIP images confirms the above findings. IMPRESSION: 1. No evidence of pulmonary embolus. 2. Significant gallbladder wall thickening, with marked distention of the gallbladder, concerning for acute cholecystitis, new from the prior study. 3. Patchy bibasilar airspace opacities raise concern for mild infection. Mild fibrotic change and emphysema noted bilaterally. 4. Known pancreatic mass is difficult to fully characterize, with vague soft tissue tracking about the portal venous system and body of the pancreas. Increasing wall thickening involving the antrum of the stomach; underlying infiltrative mass cannot be excluded. 5. Vague soft tissue density tracks about the mesentery and more inferior retroperitoneum, worsened from the prior study, with new near complete effacement of the superior mesenteric vein, raising suspicion for compression by the underlying mass. 6. Mild-to-moderate right-sided hydronephrosis raises concern for partial right ureteral obstruction at the lower abdomen due to the underlying infiltrating mass. 7. Swirling of the superior mesenteric artery and vein, concerning for some degree of twisting of small bowel about the vague mass along the mesenteric axis, without evidence for bowel ischemia. 8. Additional scattered peritoneal masses tracking about the omentum at the left lower quadrant, and at the paracolic gutters bilaterally, suspicious for worsening diffuse peritoneal carcinomatosis. 9. Vague wall thickening along the ascending colon, of uncertain significance. Underlying infiltrative mass cannot be excluded. 10. Diffuse periportal edema noted. Common bile duct stent noted in expected position. Trace  ascites noted about the liver. 11. 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 Associated diffuse filling of collateral vessels about the chest wall and neck. 12. Mild degenerative change at the lower cervical spine. Chronic osseous fusion at L4-L5, and degenerative change along the lumbar spine. Degenerative change at the right hip. 13. Diffuse aortic atherosclerosis. 14. Scattered diverticulosis along the proximal sigmoid colon, without evidence of diverticulitis. 15. Enlarged prostate noted. These results were called by telephone at the time of interpretation on 05/29/2016 at 11:08 pm to Dr. Brantley Stage, who verbally acknowledged these results. Electronically Signed   By: Garald Balding M.D.   On: 06/21/2016 23:10   Ct Abdomen Pelvis W Contrast  Result Date: 06/03/2016 CLINICAL DATA:  Acute onset of shortness of breath and chest tightness. Generalized abdominal pain. Loose stools. Current history of pancreatic cancer, on chemotherapy. Initial encounter. EXAM: CT ANGIOGRAPHY CHEST CT ABDOMEN AND PELVIS WITH CONTRAST TECHNIQUE: Multidetector CT imaging of the chest was performed using the standard protocol during bolus administration of intravenous contrast. Multiplanar CT image reconstructions and MIPs were obtained to evaluate the vascular anatomy. Multidetector CT imaging of the abdomen and pelvis was performed using the standard protocol during bolus administration of intravenous contrast. CONTRAST:  100 mL of Isovue 370 IV contrast COMPARISON:  CT of the chest, abdomen and pelvis performed 05/19/2016 FINDINGS: CTA CHEST FINDINGS Cardiovascular:  There is no evidence of pulmonary embolus. The heart is borderline normal in size. Scattered calcification is noted along the aortic arch and descending thoracic aorta. The great vessels are grossly unremarkable in appearance, though difficult to fully assess due to artifact from contrast and beam hardening artifact.  Mediastinum/Nodes: The mediastinum is grossly unremarkable in appearance. No mediastinal lymphadenopathy is seen. Trace pericardial fluid remains within normal limits. The thyroid gland is not well characterized. No axillary lymphadenopathy is seen. A left-sided chest port is noted ending about the distal SVC. 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 Lungs/Pleura: Patchy bibasilar airspace opacities raise concern for mild  infection. Mild fibrotic change and emphysematous change are noted bilaterally. No pleural effusion or pneumothorax is seen. No dominant mass is identified. Musculoskeletal: No acute osseous abnormalities are identified. Mild degenerative change is noted at the lower cervical spine. The visualized musculature is unremarkable in appearance. Review of the MIP images confirms the above findings. CT ABDOMEN and PELVIS FINDINGS Hepatobiliary: Diffuse periportal edema is noted within the liver. A common bile duct stent is seen in expected position. No definite intrahepatic biliary ductal dilatation is seen. There is significant gallbladder wall thickening, with marked distention of the gallbladder, concerning for acute cholecystitis. Trace ascites is seen tracking about the liver. Pancreas: The known pancreatic mass is difficult to fully characterize, with vague soft tissue tracking about the portal venous system and body of the pancreas. There is increasing wall thickening involving the antrum of the stomach. Underlying infiltration of mass cannot be excluded. Vague soft tissue density is seen tracking about the mesentery and underlying retroperitoneum more inferiorly. There is near complete effacement of the superior mesenteric vein, new from the prior study and raising suspicion for compression by the underlying mass. Spleen: The spleen is unremarkable in appearance. Adrenals/Urinary Tract: The adrenal glands  are unremarkable in appearance. There is mild-to-moderate right-sided hydronephrosis, raising suspicion for ureteral obstruction at the lower abdomen, likely reflecting underlying diffuse infiltrative mass. This is also new from the prior study. Scattered bilateral renal cysts are seen. Nonspecific perinephric stranding is noted bilaterally. No obstructing ureteral stones are identified. Stomach/Bowel: There is swirling of the superior mesenteric artery and vein, concerning for some degree of twisting of the small bowel about the mesenteric axis, without evidence for bowel ischemia at this time. As described above, there is near complete effacement of the superior mesenteric vein due to surrounding mass. Additional scattered peritoneal metastases are seen tracking about the omentum at the left lower quadrant, and along the paracolic gutters bilaterally, suspicious for worsening diffuse peritoneal carcinomatosis. The stomach is mildly distended with fluid and air. Vague wall thickening is noted along the ascending colon, of uncertain significance. Scattered diverticulosis is seen along the proximal sigmoid colon. Postoperative change is noted at the right lower quadrant and at the sigmoid colon. Vascular/Lymphatic: Diffuse calcification is seen along the abdominal aorta and its branches. The abdominal aorta is otherwise grossly unremarkable. The inferior vena cava is grossly unremarkable. No retroperitoneal lymphadenopathy is seen. No pelvic sidewall lymphadenopathy is identified. Reproductive: The bladder is relatively decompressed and not well assessed. The prostate is enlarged, measuring perhaps 6.1 cm in transverse dimension. Other: No additional soft tissue abnormalities are seen. Musculoskeletal: No acute osseous abnormalities are identified. The patient left hip arthroplasty is grossly unremarkable in appearance. Degenerative change is noted at the right hip. There is chronic osseous fusion at L4-L5.  Multilevel vacuum phenomenon is seen along the lumbar spine. The visualized musculature is unremarkable in appearance. Review of the MIP images confirms the above findings. IMPRESSION: 1. No evidence of pulmonary embolus. 2. Significant gallbladder wall thickening, with marked distention of the gallbladder, concerning for acute cholecystitis, new from the prior study. 3. Patchy bibasilar airspace opacities raise concern for mild infection. Mild fibrotic change and emphysema noted bilaterally. 4. Known pancreatic mass is difficult to fully characterize, with vague soft tissue tracking about the portal venous system and body of the pancreas. Increasing wall thickening involving the antrum of the stomach; underlying infiltrative mass cannot be excluded. 5. Vague soft tissue density tracks about the mesentery and more inferior retroperitoneum, worsened from the  prior study, with new near complete effacement of the superior mesenteric vein, raising suspicion for compression by the underlying mass. 6. Mild-to-moderate right-sided hydronephrosis raises concern for partial right ureteral obstruction at the lower abdomen due to the underlying infiltrating mass. 7. Swirling of the superior mesenteric artery and vein, concerning for some degree of twisting of small bowel about the vague mass along the mesenteric axis, without evidence for bowel ischemia. 8. Additional scattered peritoneal masses tracking about the omentum at the left lower quadrant, and at the paracolic gutters bilaterally, suspicious for worsening diffuse peritoneal carcinomatosis. 9. Vague wall thickening along the ascending colon, of uncertain significance. Underlying infiltrative mass cannot be excluded. 10. Diffuse periportal edema noted. Common bile duct stent noted in expected position. Trace ascites noted about the liver. 11. 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 Associated diffuse filling of  collateral vessels about the chest wall and neck. 12. Mild degenerative change at the lower cervical spine. Chronic osseous fusion at L4-L5, and degenerative change along the lumbar spine. Degenerative change at the right hip. 13. Diffuse aortic atherosclerosis. 14. Scattered diverticulosis along the proximal sigmoid colon, without evidence of diverticulitis. 15. Enlarged prostate noted. These results were called by telephone at the time of interpretation on 05/29/2016 at 11:08 pm to Dr. Brantley Stage, who verbally acknowledged these results. Electronically Signed   By: Garald Balding M.D.   On: 06/04/2016 23:10   Ct Biopsy  Result Date: 06/01/2016 INDICATION: 81 year old with history of pancreatic and prostate cancer. The patient has peritoneal nodules and needs a tissue diagnosis. EXAM: IMAGE GUIDED BIOPSY OF PERITONEAL NODULES MEDICATIONS: None. ANESTHESIA/SEDATION: Moderate (conscious) sedation was employed during this procedure. Patient was initially sedated in the CT suite. Subsequently, the patient was transferred to the ultrasound department and additional sedation was given. A total of Versed 5.0 mg and Fentanyl 200 mcg was administered intravenously was given for the entire procedure. Moderate Sedation Time: 55 minutes. The patient's level of consciousness and vital signs were monitored continuously by radiology nursing throughout the procedure under my direct supervision. FLUOROSCOPY TIME:  None COMPLICATIONS: None immediate. PROCEDURE: Informed written consent was obtained from the patient after a thorough discussion of the procedural risks, benefits and alternatives. All questions were addressed. A timeout was performed prior to the initiation of the procedure. Patient was placed supine on the CT scanner. Images through the abdomen obtained. Nodules in the right anterior abdomen were identified with CT. Right anterior abdomen was prepped and draped in a sterile fashion. Skin was anesthetized with 1%  lidocaine. These nodules are also evaluated with ultrasound. The nodules were actually easier to identify and puncture using ultrasound. A 17 gauge coaxial needle could not be advanced into the any these nodules due to the small size of the nodules and the mobility of the nodules. Therefore, 5 fine-needle aspirations were obtained with 22 gauge Inrad needles. Fine-needle aspirations were performed with ultrasound. The samples were evaluated by pathology who requested additional material for cell block. Patient was transferred to the ultrasound suite for additional sampling. The right abdomen was again prepped and draped in sterile fashion. Skin was anesthetized with 1% lidocaine. Using ultrasound guidance, 3 additional fine-needle aspirations were obtained with Inrad needles. Bandage placed over the puncture site. FINDINGS: 3 prominent peritoneal nodules in the right anterior abdomen. Due to the small size of the nodules, mobility of nodules and close proximity of the bowel, core biopsies could not be obtained within these nodules. In fact,  fine-needle aspirations of these nodules was technically very difficult. A total of 8 fine-needle aspirations were performed. IMPRESSION: Image guided biopsy of peritoneal nodules in the right anterior abdomen. Electronically Signed   By: Markus Daft M.D.   On: 06/01/2016 16:07   Dg Chest Portable 1 View  Result Date: 06/20/2016 CLINICAL DATA:  Acute onset of generalized weakness and shortness of breath. Generalized chest tightness and dry mouth. Initial encounter. EXAM: PORTABLE CHEST 1 VIEW COMPARISON:  Chest radiograph and CTA of the chest performed earlier today at 10:14 p.m. FINDINGS: The lungs are well-aerated. Worsening patchy bilateral airspace opacification is noted. This raises suspicion for pulmonary edema, given interval change since the prior CTA, and worsening interstitial prominence. No pleural effusion or pneumothorax is seen. The cardiomediastinal silhouette  is normal in size. No acute osseous abnormalities are identified. A left-sided chest port is noted ending about the distal SVC. IMPRESSION: Worsening patchy bilateral airspace opacification is suspicious for pulmonary edema. Pneumonia is considered less likely but remains a possibility. Electronically Signed   By: Garald Balding M.D.   On: 06/10/2016 23:38   Dg Chest Port 1 View  Result Date: 05/25/2016 CLINICAL DATA:  Chest pain EXAM: PORTABLE CHEST 1 VIEW COMPARISON:  Chest radiograph October 13, 2015 and chest CT May 19, 2016 FINDINGS: Port-A-Cath tip is in the superior vena cava. No pneumothorax. There is no edema or consolidation. Heart size and pulmonary vascularity are normal. No adenopathy. There is atherosclerotic calcification in the aorta. There is postoperative change in the right shoulder. IMPRESSION: Port-A-Cath tip in superior vena cava. No edema or consolidation. There is aortic atherosclerosis. Electronically Signed   By: Lowella Grip III M.D.   On: 05/25/2016 15:12   Dg Ercp  Result Date: 05/26/2016 CLINICAL DATA:  Biliary obstruction and history of pancreatic carcinoma. EXAM: ERCP TECHNIQUE: Multiple spot images obtained with the fluoroscopic device and submitted for interpretation post-procedure. COMPARISON:  CT of the abdomen on 05/19/2016 FINDINGS: Imaging was obtained with a C-arm during the endoscopic procedure. This demonstrates cannulation of the common bile duct with contrast cholangiogram demonstrating a high-grade stricture of the common hepatic duct just below the confluence of right and left-sided intrahepatic bile ducts. After guidewire passage, an endoscopic biliary stent was placed extending across the stricture and into the duodenum. IMPRESSION: High-grade stricture of the common hepatic duct just below the confluence of right and left intrahepatic bile ducts. An endoscopic biliary stent was placed extending across the stricture. These images were submitted for  radiologic interpretation only. Please see the procedural report for the amount of contrast and the fluoroscopy time utilized. Electronically Signed   By: Aletta Edouard M.D.   On: 05/26/2016 08:29   Dg Abd Portable 1v  Result Date: 05/25/2016 CLINICAL DATA:  Abdominal pain EXAM: PORTABLE ABDOMEN - 1 VIEW COMPARISON:  CT abdomen and pelvis May 19, 2016 FINDINGS: There is a biliary stent in the right upper quadrant with apparent contrast within the common bile duct. There is moderate stool throughout colon. There is no bowel dilatation or air-fluid levels suggesting bowel obstruction. No evident free air. There is a total hip prosthesis on the left. IMPRESSION: The pre stent in the right upper quadrant. Bowel gas pattern unremarkable. Electronically Signed   By: Lowella Grip III M.D.   On: 05/25/2016 15:14    Microbiology Recent Results (from the past 240 hour(s))  Culture, blood (Routine x 2)     Status: Abnormal   Collection Time: 05/25/2016  7:49 PM  Result Value Ref Range Status   Specimen Description BLOOD LEFT ANTECUBITAL  Final   Special Requests BOTTLES DRAWN AEROBIC AND ANAEROBIC [place]  Final   Culture  Setup Time   Final    GRAM NEGATIVE RODS IN BOTH AEROBIC AND ANAEROBIC BOTTLES CRITICAL RESULT CALLED TO, READ BACK BY AND VERIFIED WITH: A JOHNSON 06/15/16 @ 0856 M VESTAL    Culture ESCHERICHIA COLI PROTEUS MIRABILIS  (A)  Final   Report Status 06/18/2016 FINAL  Final   Organism ID, Bacteria ESCHERICHIA COLI  Final   Organism ID, Bacteria PROTEUS MIRABILIS  Final      Susceptibility   Escherichia coli - MIC*    AMPICILLIN 8 SENSITIVE Sensitive     CEFAZOLIN <=4 SENSITIVE Sensitive     CEFEPIME <=1 SENSITIVE Sensitive     CEFTAZIDIME <=1 SENSITIVE Sensitive     CEFTRIAXONE <=1 SENSITIVE Sensitive     CIPROFLOXACIN <=0.25 SENSITIVE Sensitive     GENTAMICIN <=1 SENSITIVE Sensitive     IMIPENEM <=0.25 SENSITIVE Sensitive     TRIMETH/SULFA <=20 SENSITIVE Sensitive      AMPICILLIN/SULBACTAM <=2 SENSITIVE Sensitive     PIP/TAZO 8 SENSITIVE Sensitive     Extended ESBL NEGATIVE Sensitive     * ESCHERICHIA COLI   Proteus mirabilis - MIC*    AMPICILLIN <=2 SENSITIVE Sensitive     CEFAZOLIN <=4 SENSITIVE Sensitive     CEFEPIME <=1 SENSITIVE Sensitive     CEFTAZIDIME <=1 SENSITIVE Sensitive     CEFTRIAXONE <=1 SENSITIVE Sensitive     CIPROFLOXACIN <=0.25 SENSITIVE Sensitive     GENTAMICIN <=1 SENSITIVE Sensitive     IMIPENEM 2 SENSITIVE Sensitive     TRIMETH/SULFA <=20 SENSITIVE Sensitive     AMPICILLIN/SULBACTAM <=2 SENSITIVE Sensitive     PIP/TAZO <=4 SENSITIVE Sensitive     * PROTEUS MIRABILIS  Blood Culture ID Panel (Reflexed)     Status: Abnormal   Collection Time: 06/03/2016  7:49 PM  Result Value Ref Range Status   Enterococcus species NOT DETECTED NOT DETECTED Final   Listeria monocytogenes NOT DETECTED NOT DETECTED Final   Staphylococcus species NOT DETECTED NOT DETECTED Final   Staphylococcus aureus NOT DETECTED NOT DETECTED Final   Streptococcus species NOT DETECTED NOT DETECTED Final   Streptococcus agalactiae NOT DETECTED NOT DETECTED Final   Streptococcus pneumoniae NOT DETECTED NOT DETECTED Final   Streptococcus pyogenes NOT DETECTED NOT DETECTED Final   Acinetobacter baumannii NOT DETECTED NOT DETECTED Final   Enterobacteriaceae species DETECTED (A) NOT DETECTED Final    Comment: Enterobacteriaceae represent a large family of gram-negative bacteria, not a single organism. CRITICAL RESULT CALLED TO, READ BACK BY AND VERIFIED WITH: A JOHNSON 06/15/16 @ 0856 M VESTAL    Enterobacter cloacae complex NOT DETECTED NOT DETECTED Final   Escherichia coli DETECTED (A) NOT DETECTED Final    Comment: CRITICAL RESULT CALLED TO, READ BACK BY AND VERIFIED WITH: A JOHNSON 06/15/16 @ 0856 M VESTAL    Klebsiella oxytoca NOT DETECTED NOT DETECTED Final   Klebsiella pneumoniae NOT DETECTED NOT DETECTED Final   Proteus species NOT DETECTED NOT DETECTED  Final   Serratia marcescens NOT DETECTED NOT DETECTED Final   Carbapenem resistance NOT DETECTED NOT DETECTED Final   Haemophilus influenzae NOT DETECTED NOT DETECTED Final   Neisseria meningitidis NOT DETECTED NOT DETECTED Final   Pseudomonas aeruginosa NOT DETECTED NOT DETECTED Final   Candida albicans NOT DETECTED NOT DETECTED Final   Candida glabrata NOT DETECTED NOT DETECTED Final  Candida krusei NOT DETECTED NOT DETECTED Final   Candida parapsilosis NOT DETECTED NOT DETECTED Final   Candida tropicalis NOT DETECTED NOT DETECTED Final  Culture, blood (Routine x 2)     Status: Abnormal   Collection Time: 06/05/2016  8:59 PM  Result Value Ref Range Status   Specimen Description BLOOD RIGHT ANTECUBITAL  Final   Special Requests IN PEDIATRIC BOTTLE 3CC  Final   Culture  Setup Time   Final    GRAM NEGATIVE RODS IN PEDIATRIC BOTTLE CRITICAL RESULT CALLED TO, READ BACK BY AND VERIFIED WITH: A JOHNSON 06/15/16 @ 0856 M VESTAL    Culture (A)  Final    ESCHERICHIA COLI PROTEUS MIRABILIS SUSCEPTIBILITIES PERFORMED ON PREVIOUS CULTURE WITHIN THE LAST 5 DAYS.    Report Status 06/18/2016 FINAL  Final   Organism ID, Bacteria ESCHERICHIA COLI  Final      Susceptibility   Escherichia coli - MIC*    AMPICILLIN 16 INTERMEDIATE Intermediate     CEFAZOLIN <=4 SENSITIVE Sensitive     CEFEPIME <=1 SENSITIVE Sensitive     CEFTAZIDIME <=1 SENSITIVE Sensitive     CEFTRIAXONE <=1 SENSITIVE Sensitive     CIPROFLOXACIN <=0.25 SENSITIVE Sensitive     GENTAMICIN <=1 SENSITIVE Sensitive     IMIPENEM <=0.25 SENSITIVE Sensitive     TRIMETH/SULFA <=20 SENSITIVE Sensitive     AMPICILLIN/SULBACTAM 4 SENSITIVE Sensitive     PIP/TAZO 8 SENSITIVE Sensitive     Extended ESBL NEGATIVE Sensitive     * ESCHERICHIA COLI  MRSA PCR Screening     Status: None   Collection Time: 06/15/16  5:00 AM  Result Value Ref Range Status   MRSA by PCR NEGATIVE NEGATIVE Final    Comment:        The GeneXpert MRSA Assay  (FDA approved for NASAL specimens only), is one component of a comprehensive MRSA colonization surveillance program. It is not intended to diagnose MRSA infection nor to guide or monitor treatment for MRSA infections.   Aerobic/Anaerobic Culture (surgical/deep wound)     Status: None (Preliminary result)   Collection Time: 06/15/16 12:02 PM  Result Value Ref Range Status   Specimen Description BILE  Final   Special Requests NONE  Final   Gram Stain   Final    MODERATE WBC PRESENT, PREDOMINANTLY MONONUCLEAR MODERATE GRAM NEGATIVE RODS    Culture   Final    ABUNDANT ESCHERICHIA COLI NO ANAEROBES ISOLATED; CULTURE IN PROGRESS FOR 5 DAYS    Report Status PENDING  Incomplete   Organism ID, Bacteria ESCHERICHIA COLI  Final      Susceptibility   Escherichia coli - MIC*    AMPICILLIN 8 SENSITIVE Sensitive     CEFAZOLIN <=4 SENSITIVE Sensitive     CEFEPIME <=1 SENSITIVE Sensitive     CEFTAZIDIME <=1 SENSITIVE Sensitive     CEFTRIAXONE <=1 SENSITIVE Sensitive     CIPROFLOXACIN <=0.25 SENSITIVE Sensitive     GENTAMICIN <=1 SENSITIVE Sensitive     IMIPENEM <=0.25 SENSITIVE Sensitive     TRIMETH/SULFA <=20 SENSITIVE Sensitive     AMPICILLIN/SULBACTAM 4 SENSITIVE Sensitive     PIP/TAZO <=4 SENSITIVE Sensitive     Extended ESBL NEGATIVE Sensitive     * ABUNDANT ESCHERICHIA COLI    Lab Basic Metabolic Panel:  Recent Labs Lab 06/13/16 1035 06/04/2016 2004 06/15/16 0325  NA 138 137 135  K 3.9 4.2 4.1  CL  --  103 104  CO2 26 15* 11*  GLUCOSE 235* 171* 144*  BUN 17.5 21* 23*  CREATININE 1.0 1.32* 1.27*  CALCIUM 9.1 7.5* 7.2*   Liver Function Tests:  Recent Labs Lab 06/13/16 0946 06/13/16 1035 05/27/2016 2004 06/15/16 0325  AST 41* 45* <5* 208*  ALT 55* 64* <5* 156*  ALKPHOS 148* 165* 237* 234*  BILITOT 2.7* 2.62* 4.6* 5.5*  PROT 6.7 6.8 5.4* 5.5*  ALBUMIN 3.3* 2.6* 2.2* 2.1*    Recent Labs Lab 06/13/2016 2004  LIPASE <10*   CBC:  Recent Labs Lab  06/13/16 1035 06/05/2016 2004 06/15/16 0325  WBC 7.4 12.9* 12.1*  NEUTROABS 6.0 12.6* 11.4*  HGB 10.6* 10.2* 8.7*  HCT 31.7* 31.4* 27.0*  MCV 81.8 81.1 82.8  PLT 256 116* 86*   Cardiac Enzymes:  Recent Labs Lab 06/15/16 0755 06/15/16 1231  TROPONINI 0.09* 0.17*   Sepsis Labs:  Recent Labs Lab 06/13/16 1035 05/25/2016 2003 06/12/2016 2004 06/15/16 0325 06/15/16 0350 06/15/16 0755  PROCALCITON  --   --   --  65.05  --   --   WBC 7.4  --  12.9* 12.1*  --   --   LATICACIDVEN  --  10.09*  --   --  7.8* 8.3*    Procedures/Operations  Percutaneous cholecystostomy drain 06/16/2016.   Vance Gather Jul 12, 2016, 2:00 PM

## 2016-06-25 NOTE — Care Management Important Message (Signed)
Important Message  Patient Details  Name: [name] MRN: [id number] Date of Birth: 1935-08-20   Medicare Important Message Given:  Yes    [name] July 05, 2016, 12:49 PM

## 2016-06-25 NOTE — Progress Notes (Signed)
Daily Progress Note   Patient Name: [name]       Date: Jul 01, 2016 DOB: [birth year]  Age: 81 y.o. MRN#: [id number] Attending Physician: Patrecia Pour, MD Primary Care Physician: Binnie Rail, MD Admit Date: 06/12/2016  Reason for Consultation/Follow-up: Non pain symptom management, Pain control, Psychosocial/spiritual support and Terminal Care  Subjective: Patient's respirations are less labored on hydromorphone continuous infusion at 1 mg an hour. There have been no boluses noted overnight. No apnea observed, respiratory rate 18/m. Patient is actively dying. Wife is at the bedside and is prepared for death to be at any time  Length of Stay: 4  Current Medications: Scheduled Meds:  . latanoprost  1 drop Both Eyes QHS  . mouth rinse  15 mL Mouth Rinse BID    Continuous Infusions: . sodium chloride 10 mL/hr at 06/16/16 1323  . HYDROmorphone 1 mg/hr (06/18/16 0944)  . midazolam (VERSED) infusion 2 mg/hr (Jul 01, 2016 0214)    PRN Meds: antiseptic oral rinse, [DISCONTINUED] glycopyrrolate **OR** [DISCONTINUED] glycopyrrolate **OR** glycopyrrolate, HYDROmorphone, midazolam, sodium chloride flush  Physical Exam  Constitutional:  Cachectic older man. Appears to be actively dying  HENT:  Head: Normocephalic and atraumatic.  Pulmonary/Chest:  Respiratory rate 18/m No apnea observed  Neurological:  Unresponsive to voice and touch  Skin:  Cool, mottled  Nursing note and vitals reviewed.           Vital Signs: BP (!) 78/36 (BP Location: Right Arm)   Pulse 60   Temp 97.7 F (36.5 C) (Axillary)   Resp 13   Ht 5\' 10"  (1.778 m)   Wt 56.7 kg (125 lb)   SpO2 (!) 88%   BMI 17.94 kg/m  SpO2: SpO2: (!) 88 % O2 Device: O2 Device: High Flow Nasal Cannula O2 Flow Rate: O2 Flow Rate  (L/min): 15 L/min  Intake/output summary:   Intake/Output Summary (Last 24 hours) at 07-01-16 0942 Last data filed at 07-01-16 0700  Gross per 24 hour  Intake            595.9 ml  Output              100 ml  Net            495.9 ml   LBM: Last BM Date: 06/15/16 Baseline Weight: Weight: 54.4 kg (120  lb) Most recent weight: Weight: 56.7 kg (125 lb)       Palliative Assessment/Data:    Flowsheet Rows     Most Recent Value  Intake Tab  Referral Department  Hospitalist  Unit at Time of Referral  ER  Palliative Care Primary Diagnosis  Cancer  Date Notified  06/15/16  Palliative Care Type  New Palliative care  Reason for referral  Clarify Goals of Care  Date of Admission  06/04/2016  Date first seen by Palliative Care  06/15/16  # of days Palliative referral response time  0 Day(s)  # of days IP prior to Palliative referral  1  Clinical Assessment  Psychosocial & Spiritual Assessment  Palliative Care Outcomes      Patient Active Problem List   Diagnosis Date Noted  . Palliative care encounter   . Sepsis ([place]) 06/15/2016  . Acute respiratory failure with hypoxia ([place]) 06/15/2016  . Acute cholecystitis   . Dehydration 06/05/2016  . Post-op pain   . Acute pancreatitis   . Biliary obstruction   . Elevated LFTs   . Abnormal CT scan, gastrointestinal tract   . Port catheter in place 04/25/2016  . Bilateral leg edema 12/01/2015  . Malignant neoplasm of body of pancreas ([place]) 10/07/2015  . AP (abdominal pain) 09/07/2015  . Glaucoma 06/17/2015  . Macular degeneration 06/17/2015  . Dysphagia, pharyngoesophageal phase 09/23/2014  . Hypokalemia 03/17/2014  . ANGIOEDEMA 12/22/2009  . PROSTATE CANCER, HX OF 12/22/2009  . Hyperlipidemia 04/13/2009  . DIVERTICULOSIS, COLON 10/28/2007  . COLONIC POLYPS, HX OF 10/28/2007  . Diabetes type 2, controlled ([place]) 11/22/2006  . Essential hypertension 09/06/2006    Palliative Care Assessment & Plan   Patient Profile:  [name]  is a 81 y.o. male admitted 06/15/2016 for severe sepsis due to E. coli bacteremia due to cholecystitis, and possible pneumonia. He is frail with a history of pancreatic CA on chemotherapy and obstructive jaundice s/p biliary stent placement 3 weeks prior to admission and presented with fever, chills, loose stools and epigastric/RUQ abdominal pain. On presentation he was tachycardic and hypotensive with initial lactic acid of 10. CT abd/pelvis and CTA chest showed evidence of cholecystitis and possible pneumonia with progression of pancreatic mass. IVF's and empiric antibiotics were provided, though he developed worsening dyspnea and repeat CXR showed pulmonary edema so fluids were stopped. He is not a candidate for surgery, so IR placed a perc drain 3/22. BP remained low and he had required NIPPV, so plan was discussed including intubation and pressors which the family and patient did not wish to pursue. After discussions with palliative care, the patient and family agreed that comfort should be the primary goal of care. Antibiotics were discontinue. Fentanyl and ativan were provided for dyspnea and anxiety, and he was transferred to 6N. The patient pulled the biliary drain out 3/22. He had intermittent agitation requiring infusion of benzodiazepine which was titrated to good effect. Fentanyl requirements steadily increased, requiring infusion. When breathing remained labored, this was transitioned to dilaudid infusion with bolus dosing with good effect. The patient remains calm in no distress, not responsive to verbal stimuli. His wife has remained at the bedside, reporting an uneventful evening. His breathing is much calmer and he has not been agitated.  3/26: Much more comfortable, breathing is not labored. He is diaphoretic, hot to touch. Wife remains at bedside. Coping well with unexpected prolonged dying phase.  Recommendations/Plan:  Patient is comfort care requiring IV medications to maintain  comfort  Turned oxygen down to 2L-will reassess this afternoon and remove nasal cannula when son arrives 3/26  Cont Dilaudid continuous infusion at 1 mg an hour with 1 mg every 30 minutes as needed for shortness of breath or pain. We'll monitor breakthrough doses and titrate for effect  Secretions: Continue Robinul as needed. Monitor for need for scheduled dosing but overall improved- ok to suction as needed  Agitation: No agitation continue Haldol as needed.  Goals of Care and Additional Recommendations:  Limitations on Scope of Treatment: Full Comfort Care  Code Status:    Code Status Orders        Start     Ordered   06/16/16 0758  Do not attempt resuscitation (DNR)  Continuous    Question Answer Comment  In the event of cardiac or respiratory ARREST Do not call a "code blue"   In the event of cardiac or respiratory ARREST Do not perform Intubation, CPR, defibrillation or ACLS   In the event of cardiac or respiratory ARREST Use medication by any route, position, wound care, and other measures to relive pain and suffering. May use oxygen, suction and manual treatment of airway obstruction as needed for comfort.      06/16/16 0759    Code Status History    Date Active Date Inactive Code Status Order ID Comments User Context   06/15/2016  3:27 AM 06/16/2016  7:59 AM DNR [id number]  Rise Patience, MD ED   06/15/2016 12:09 AM 06/15/2016  3:27 AM DNR [id number]  Forde Dandy, MD ED   05/25/2016  4:43 PM 05/26/2016  7:42 PM Full Code [id number]  Dron Tanna Furry, MD Inpatient       Prognosis:   Hours - Days  Discharge Planning:  Anticipated Hospital Death- patient is not appropriate for transport out of the hospital   Thank you for allowing the Palliative Medicine Team to assist in the care of this patient.   Time In: 0800 Time Out: 0830 Total Time 30 min Prolonged Time Billed  no       Greater than 50%  of this time was spent counseling and coordinating care related  to the above assessment and plan.  GOLDING,ELIZABETH, DO  Please contact Palliative Medicine Team phone at [phone] for questions and concerns.

## 2016-06-25 NOTE — Progress Notes (Signed)
[facility] Daily Progress Note:   Subjective: [name]  is a 81 y.o. male admitted 06/15/2016 for severe sepsis due to E. coli bacteremia due to cholecystitis, and possible pneumonia. He is frail with a history of pancreatic CA on chemotherapy and obstructive jaundice s/p biliary stent placement 3 weeks prior to admission and presented with fever, chills, loose stools and epigastric/RUQ abdominal pain. On presentation he was tachycardic and hypotensive with initial lactic acid of 10. CT abd/pelvis and CTA chest showed evidence of cholecystitis and possible pneumonia with progression of pancreatic mass. IVF's and empiric antibiotics were provided, though he developed worsening dyspnea and repeat CXR showed pulmonary edema so fluids were stopped. He is not a candidate for surgery, so IR placed a perc drain 3/22. BP remained low and he had required NIPPV, so plan was discussed including intubation and pressors which the family and patient did not wish to pursue. After discussions with palliative care, the patient and family agreed that comfort should be the primary goal of care. Antibiotics were discontinue. Fentanyl and ativan were provided for dyspnea and anxiety, and he was transferred to 6N. The patient pulled the biliary drain out 3/22. He had intermittent agitation requiring infusion of benzodiazepine which was titrated to good effect. Fentanyl requirements steadily increased, requiring infusion. When breathing remained labored, this was transitioned to dilaudid infusion with bolus dosing with good effect. The patient remains calm in no distress, not responsive to verbal stimuli. His wife has remained at the bedside, reporting an uneventful evening. His breathing is much calmer and he has not been agitated.  Objective: BP 78/36  HR 60   Temp 97.33F  RR 13/min  SpO2 88% Gen: Pt in no acute distress, resting with mouth wide open Resp: Normal rate and effort. Secretions improved with no  crackles or wheezes.   GI: dressing on right abdomen c/d/i  Assessment & Plan: Comfort care: The palliative care team has been providing very valuable care to this patient and family, and their assistance is appreciated. Anticipate hospital death. - Versed gtt at 2mg /hr with good control of agitation - Continue dilaudid gtt, no bolus doses. - Continue robinul and prn suctioning - Turn pt q2h prn comfort. - Wife at bedside, all questions answered - Discussed with palliative care team at the time of assessment, still do not believe transfer is prudent.  Severe sepsis due to E. coli bacteremia from cholecystitis: Not surgical candidate, got perc drain, but pulled it out 3/22. Desires comfort care in stead of intubation and pressors.  - DC'ed fluids, abx, monitor. Did receive multiple doses of abx and drainage.  - Ok to leave catheter in per IR  Acute delirium: Due to severe illness, sepsis, acute respiratory failure with hypoxemia, hospitalization and sedating medications - Continue delirium precautions, family at bedside - Versed gtt  Acute respiratory failure: Due to sepsis, pulmonary edema.  - HFNC currently for comfort - Dilaudid gtt for air hunger and comfort  Pancreatic CA:  - Dr. Burr Medico added to treatment team and came by to visit patient and family 3/22.   Vance Gather, MD Pager [phone]  06-28-16 9:24 AM

## 2016-06-25 NOTE — Progress Notes (Signed)
Shift assessment complete. Patient resting comfortably, unresponsive to voice/touch, family at bedside. Respirations even and unlabored, lungs cta. HRR. No acute distress noted. Mouth care complete. Continuous infusions remain to left medi port as per order.

## 2016-06-25 DEATH — deceased

## 2016-06-28 ENCOUNTER — Ambulatory Visit: Payer: Medicare Other | Admitting: Gastroenterology

## 2016-07-06 ENCOUNTER — Ambulatory Visit: Payer: Medicare Other | Admitting: Internal Medicine

## 2016-07-07 ENCOUNTER — Other Ambulatory Visit: Payer: Self-pay | Admitting: Nurse Practitioner

## 2016-08-23 ENCOUNTER — Ambulatory Visit: Payer: Self-pay | Admitting: Radiation Oncology

## 2016-08-25 NOTE — Addendum Note (Signed)
Addendum  created 08/25/16 1214 by Rica Koyanagi, MD   Sign clinical note

## 2016-09-02 IMAGING — DX DG CHEST 1V PORT
1 series · 1 of 1 positions shown · non-contrast
Comparison: Chest radiograph November 30, 2009 and chest CT October 06, 2015

CLINICAL DATA: Port-A-Cath placement.  Pancreatic carcinoma

EXAM:
DG C-ARM 1-60 MIN-NO REPORT; PORTABLE CHEST - 1 VIEW

[chest ap]
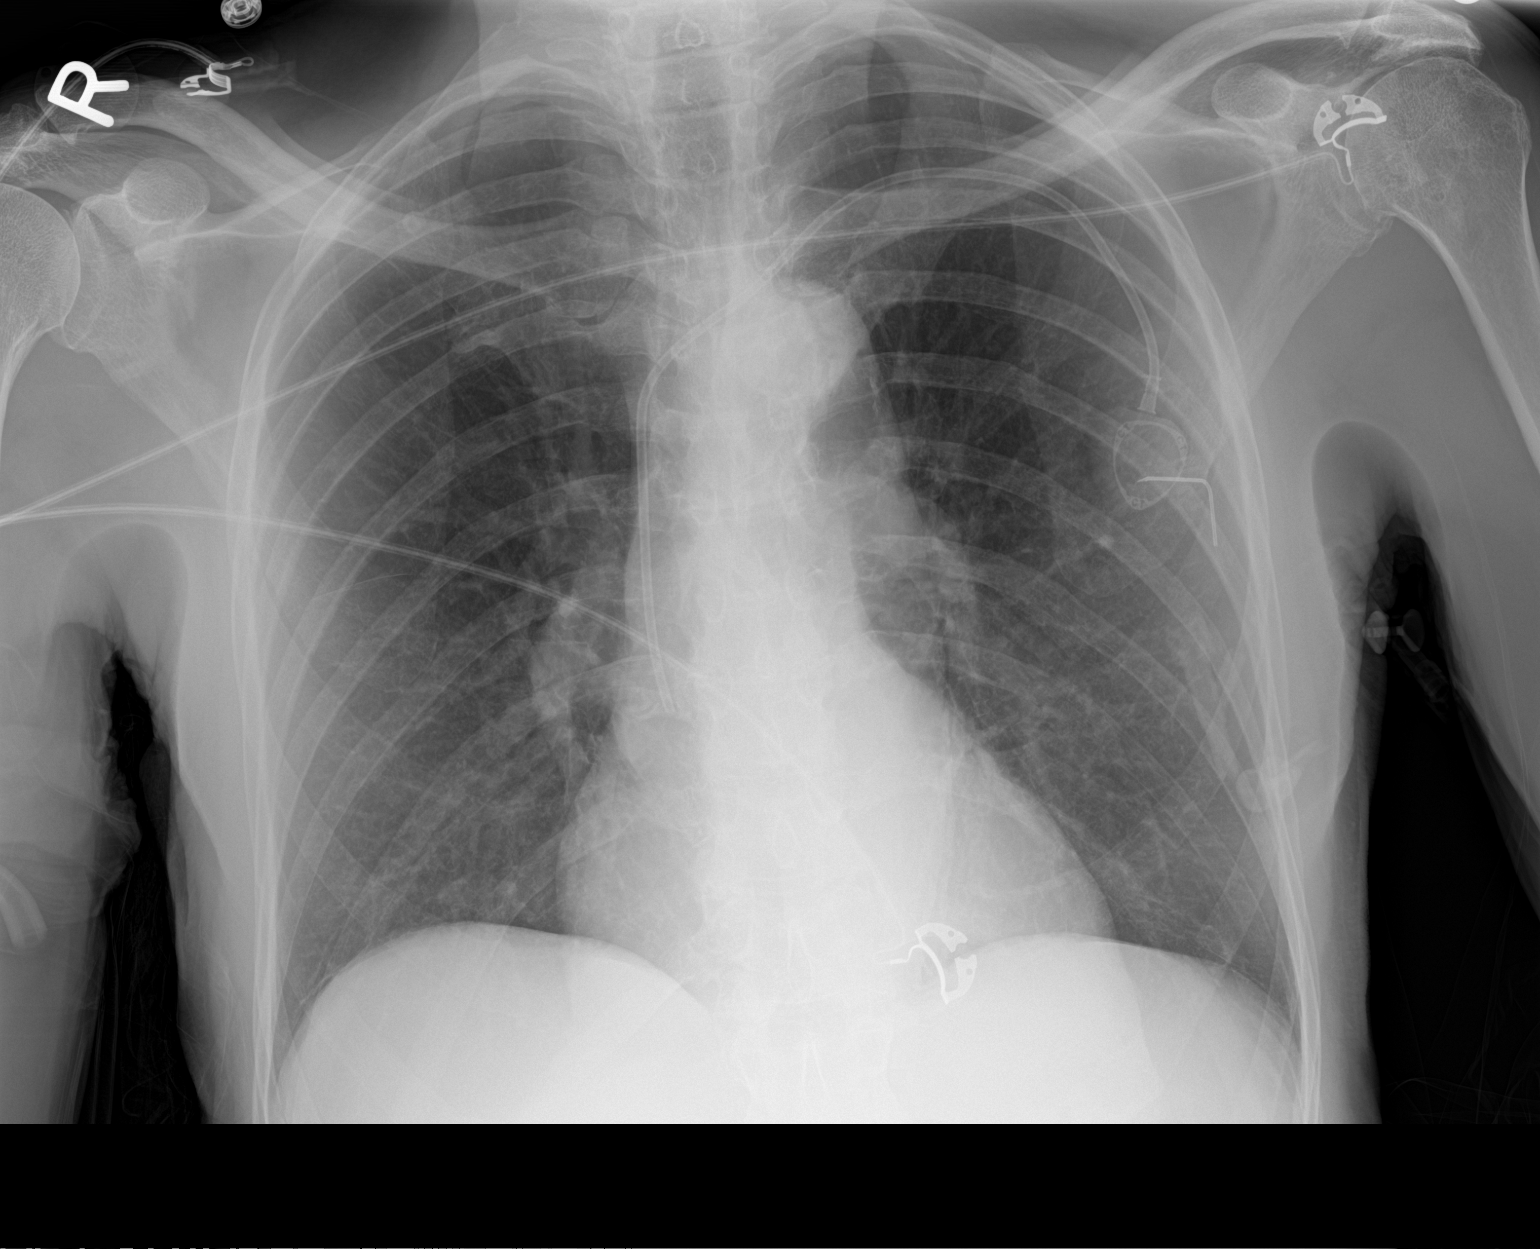

[1 of 1 positions shown; findings below may reference images not displayed]

FINDINGS: Port-A-Cath tip is in the superior vena cava in. No pneumothorax. No
edema or consolidation. Heart size and pulmonary vascularity are
normal. No adenopathy. There is atherosclerotic calcification in the
proximal descending thoracic aorta. There are no demonstrable
blastic or lytic bone lesions evident.
IMPRESSION: Port-A-Cath tip in superior vena cava. No pneumothorax. No edema or
consolidation. There is aortic atherosclerosis.

## 2016-10-27 IMAGING — CT CT ABD-PELV W/ CM
3 of 9 series · 15 of 46 positions shown, 17 images · IV contrast (ISOVUE)
Comparison: CT the abdomen and pelvis 10/06/2015.

ADDENDUM:
Upon further review, the superior mesenteric vein is uninvolved by
the mass, and is clearly widely patent excepting some collateral
flow from multiple splenoportal collateral pathway is bypassing the
narrowed splenic vein.
CLINICAL DATA: 79-year-old male with history of pancreatic cancer
diagnosed 2 months ago. Chemotherapy currently in progress. History
of colonic resection due to obstruction.

EXAM:
CT ABDOMEN AND PELVIS WITH CONTRAST
TECHNIQUE: Multidetector CT imaging of the abdomen and pelvis was performed
using the standard protocol following bolus administration of
intravenous contrast.
CONTRAST:  100mL L9OFVM-466 IOPAMIDOL (L9OFVM-466) INJECTION 61%

[Series 4: pancreas portal venous · axial · portal-venous · 0.69mm/px · z∈[-198,-68]mm · 3 of 80 slices shown]
[im 14/80  soft-tissue]
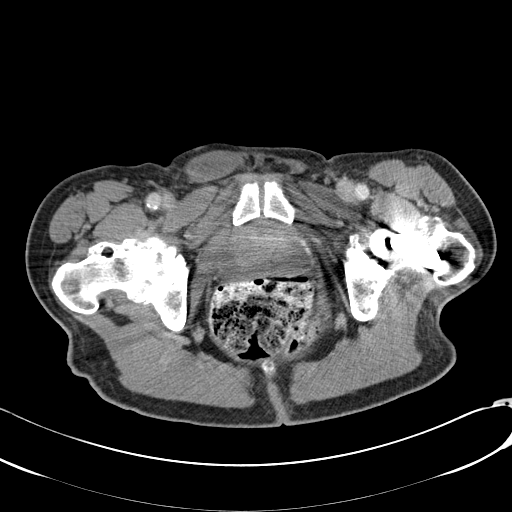
[im 27/80  soft-tissue]
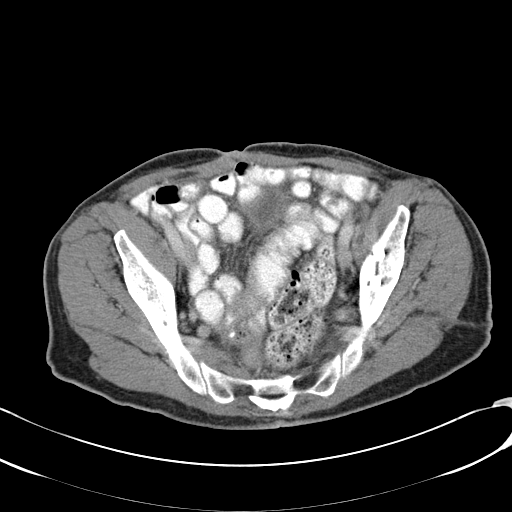
[im 40/80  soft-tissue]
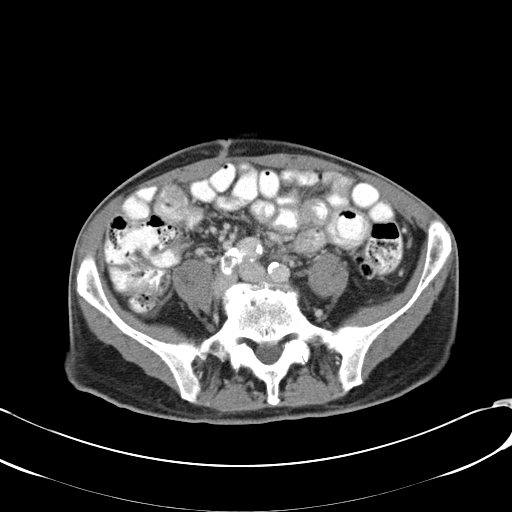

[Series 6: venous thins pacs · axial · portal-venous · 0.69mm/px · z∈[-226,+90]mm · 9 of 133 slices shown, 11 images]
[im 14/133  soft-tissue]
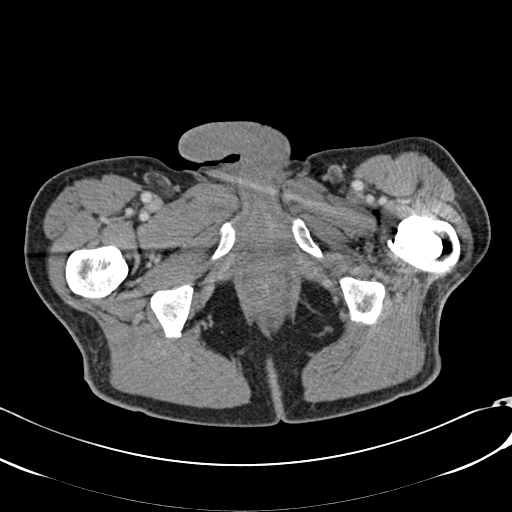
[im 14/133  bone]
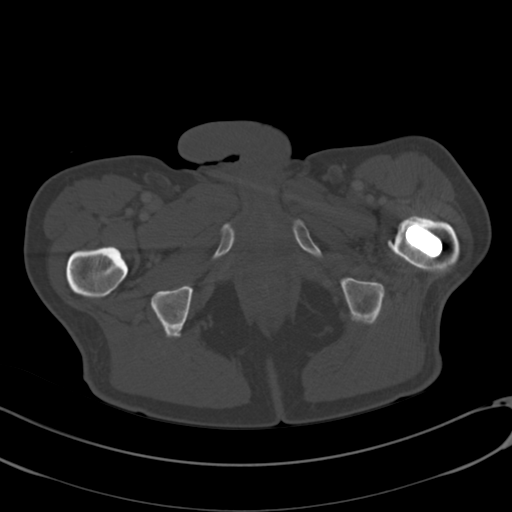
[im 27/133  soft-tissue]
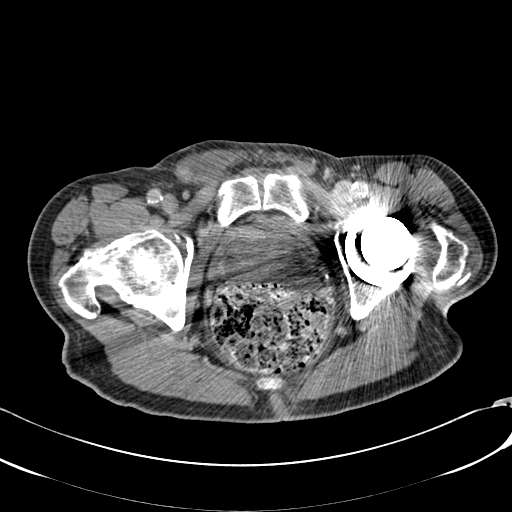
[im 40/133  soft-tissue]
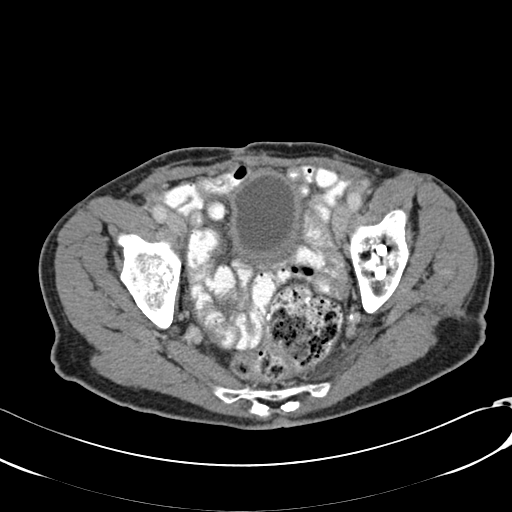
[im 53/133  soft-tissue]
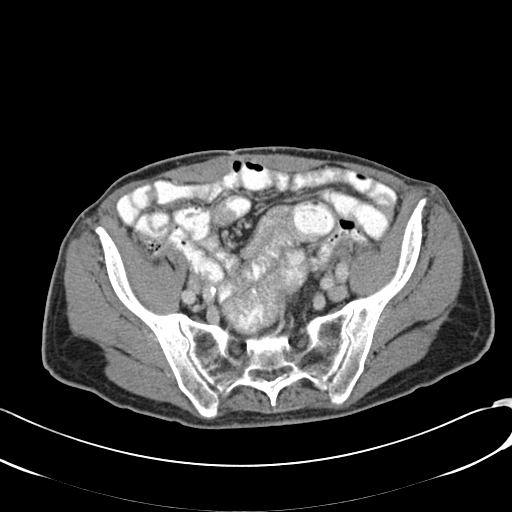
[im 67/133  soft-tissue]
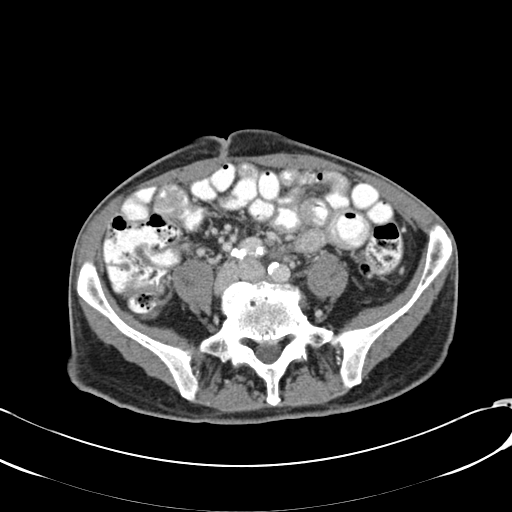
[im 80/133  soft-tissue]
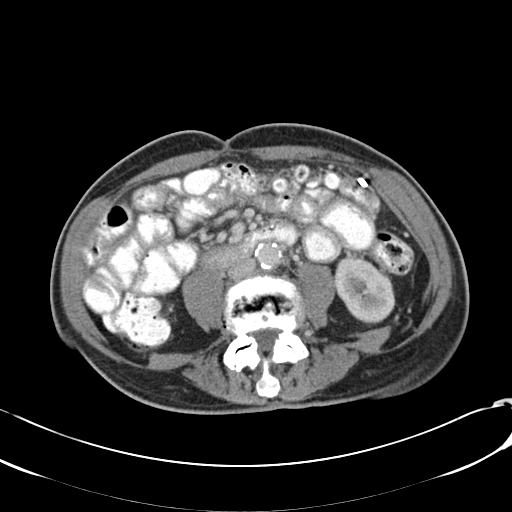
[im 93/133  soft-tissue]
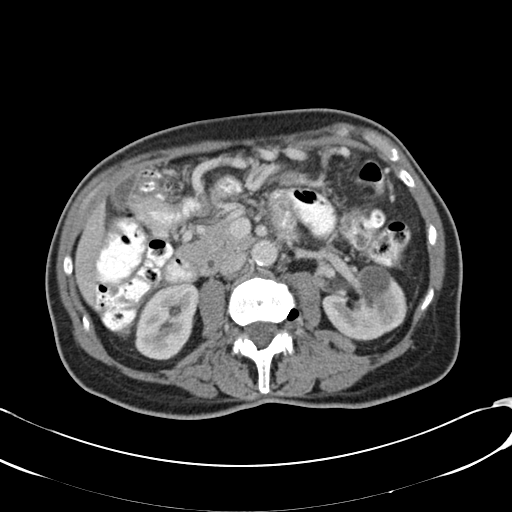
[im 106/133  soft-tissue]
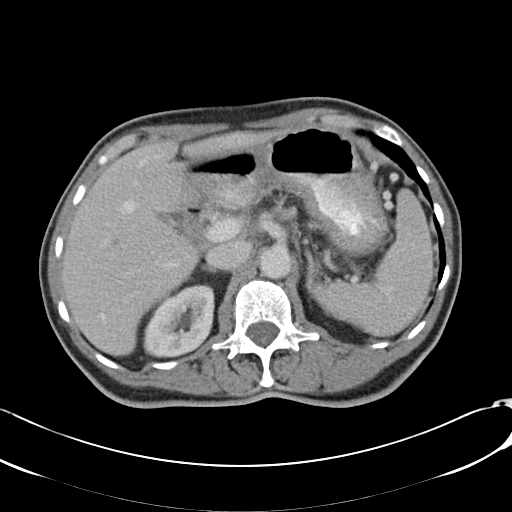
[im 119/133  soft-tissue]
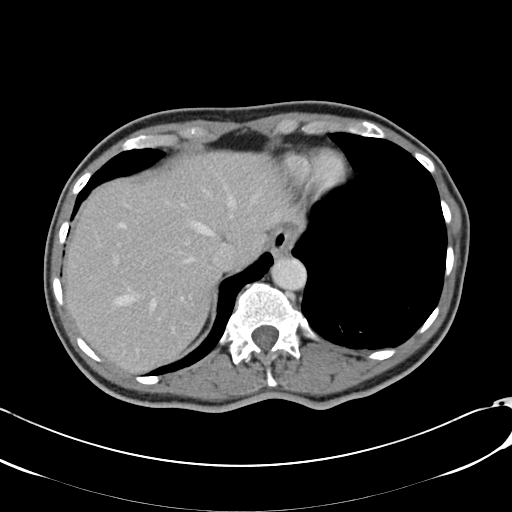
[im 119/133  bone]
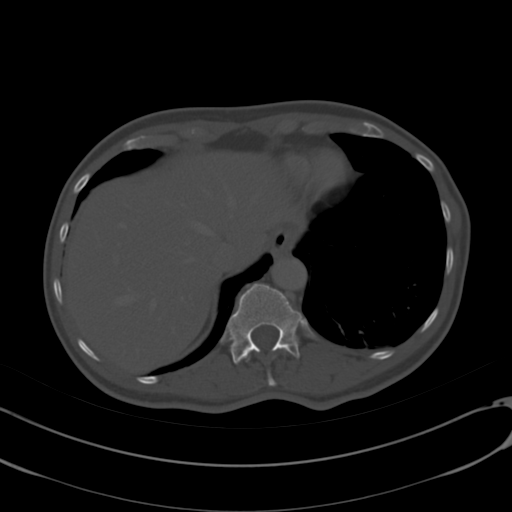

[Series 602: <mpr thick range> · coronal · 0.69mm/px · 3 of 118 slices shown]
[im 30/118  soft-tissue]
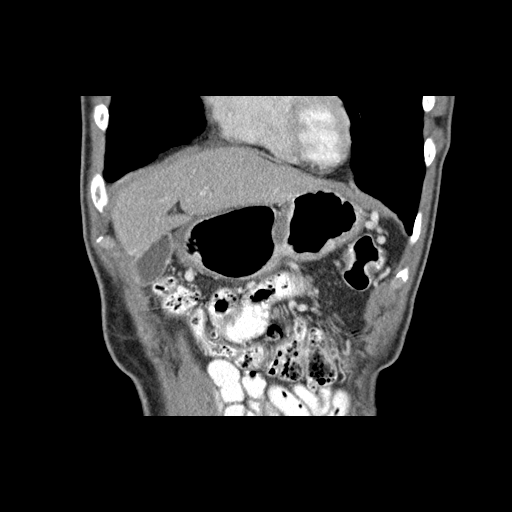
[im 59/118  soft-tissue]
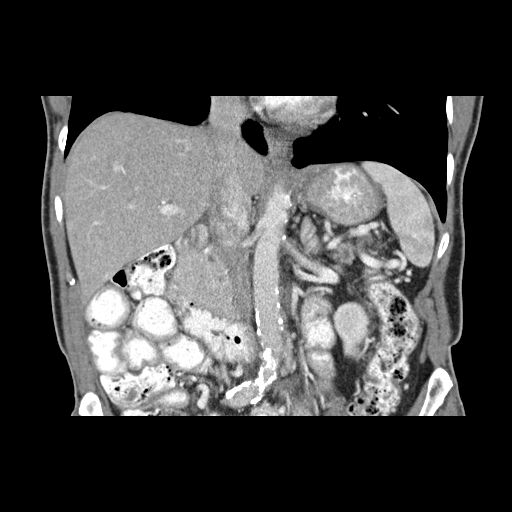
[im 88/118  soft-tissue]
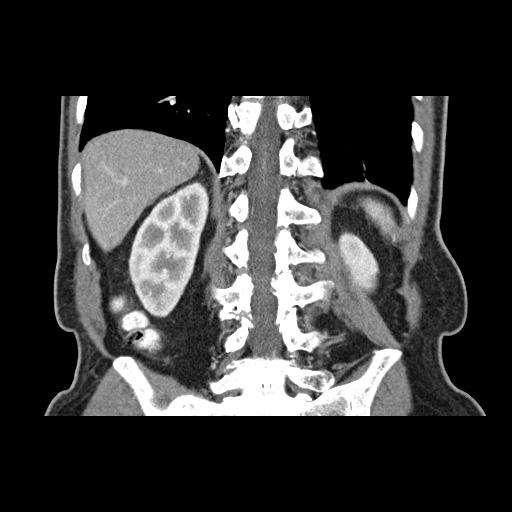

[15 of 46 positions shown; findings below may reference images not displayed]

FINDINGS: Lower chest: Mild scarring in the visualize lung bases. Mild
centrilobular emphysema.

Hepatobiliary: Several sub cm low-attenuation lesions are noted in
the liver, similar to prior study 10/06/2015, and although these are
too small to definitively characterize, these are favored to
represent tiny cysts. No other new suspicious hepatic lesions are
noted. No intra or extrahepatic biliary ductal dilatation.
Gallbladder is normal in appearance.

Pancreas: Previously noted hypovascular mass in the mid body of the
pancreas appears slightly smaller than the prior examination,
currently measuring 2.2 x 2.7 cm (image 36 of series 2). This lesion
is intimately associated with multiple vascular structures,
including the splenic artery and vein (adjacent to the splenoportal
confluence), the anterior surface of the superior mesenteric artery
(makes contact with this over approximately 30% of its
circumference), and the anterior pancreaticoduodenal artery.
Additionally, these superior mesenteric vein appears occluded by
this lesion (multiple collateral veins appear bypass cystic lesion
in the upper abdomen). This is again associated with ductal
dilatation throughout the distal body and tail of the pancreas,
similar to the prior examination. Head, uncinate process and
proximal body of the pancreas are normal in appearance. No
peripancreatic inflammatory changes.

Spleen: Unremarkable.

Adrenals/Urinary Tract: Again noted are multiple well-defined
low-attenuation lesions in the kidneys bilaterally, compatible with
simple cysts, largest of which measures 2.7 cm in the upper pole of
the left kidney. No hydroureteronephrosis. Urinary bladder is
partially obscured by beam hardening artifact from the patient's
left hip arthroplasty, but is generally unremarkable in appearance.

Stomach/Bowel: The appearance of the stomach is normal. There is no
pathologic dilatation of small bowel or colon. Numerous colonic
diverticulae are noted, without surrounding inflammatory changes to
suggest an acute diverticulitis at this time. Status post
appendectomy.

Vascular/Lymphatic: Aortic atherosclerosis, without evidence of
aneurysm or dissection in the abdominal or pelvic vasculature. No
lymphadenopathy noted in the abdomen or pelvis. Specifically,
previously noted enlarged hepatoduodenal ligament lymph node is no
longer identified.

Reproductive: Prostate gland appears enlarged and heterogeneous
measuring up to 3.7 x 6.2 cm. Median lobe hypertrophy. Seminal
vesicles are unremarkable in appearance.

Other: No significant volume of ascites.  No pneumoperitoneum.

Musculoskeletal: There are no aggressive appearing lytic or blastic
lesions noted in the visualized portions of the skeleton. Status
post left total hip arthroplasty.
IMPRESSION: 1. Today's study demonstrates a positive response to therapy as
evidenced by regression of hypovascular mass in the body of the
pancreas, and regression of the previously noted enlarged
hepatoduodenal ligament lymph node. No new sites of metastatic
disease are noted elsewhere in the abdomen or pelvis.
2. Aortic atherosclerosis.
3. Colonic diverticulosis without evidence of acute diverticulitis
at this time.
4. Prostatomegaly with median lobe hypertrophy.
5. Mild centrilobular emphysema.
